# Patient Record
Sex: Female | Born: 1935 | Race: White | Hispanic: No | Smoking: Former smoker
Health system: Southern US, Community
[De-identification: ages and names within clinical notes are randomized; demographics above are authoritative.]

## PROBLEM LIST (undated history)

## (undated) DIAGNOSIS — N289 Disorder of kidney and ureter, unspecified: Secondary | ICD-10-CM

## (undated) DIAGNOSIS — I4821 Permanent atrial fibrillation: Secondary | ICD-10-CM

## (undated) DIAGNOSIS — M5136 Other intervertebral disc degeneration, lumbar region: Secondary | ICD-10-CM

## (undated) DIAGNOSIS — M51369 Other intervertebral disc degeneration, lumbar region without mention of lumbar back pain or lower extremity pain: Secondary | ICD-10-CM

## (undated) DIAGNOSIS — F0390 Unspecified dementia without behavioral disturbance: Secondary | ICD-10-CM

## (undated) DIAGNOSIS — G629 Polyneuropathy, unspecified: Secondary | ICD-10-CM

## (undated) DIAGNOSIS — I671 Cerebral aneurysm, nonruptured: Secondary | ICD-10-CM

## (undated) DIAGNOSIS — I2699 Other pulmonary embolism without acute cor pulmonale: Secondary | ICD-10-CM

## (undated) DIAGNOSIS — I82409 Acute embolism and thrombosis of unspecified deep veins of unspecified lower extremity: Secondary | ICD-10-CM

## (undated) DIAGNOSIS — J449 Chronic obstructive pulmonary disease, unspecified: Secondary | ICD-10-CM

## (undated) DIAGNOSIS — M1990 Unspecified osteoarthritis, unspecified site: Secondary | ICD-10-CM

## (undated) DIAGNOSIS — G8929 Other chronic pain: Secondary | ICD-10-CM

## (undated) DIAGNOSIS — I10 Essential (primary) hypertension: Secondary | ICD-10-CM

## (undated) DIAGNOSIS — M109 Gout, unspecified: Secondary | ICD-10-CM

## (undated) DIAGNOSIS — M25561 Pain in right knee: Secondary | ICD-10-CM

## (undated) DIAGNOSIS — M25562 Pain in left knee: Secondary | ICD-10-CM

## (undated) HISTORY — PX: PACEMAKER INSERTION: SHX728

## (undated) HISTORY — PX: OTHER SURGICAL HISTORY: SHX169

---

## 1999-05-08 HISTORY — PX: CARDIAC CATHETERIZATION: SHX172

## 1999-09-08 ENCOUNTER — Encounter: Payer: Self-pay | Admitting: Cardiology

## 1999-09-08 ENCOUNTER — Inpatient Hospital Stay (HOSPITAL_COMMUNITY): Admission: AD | Admit: 1999-09-08 | Discharge: 1999-09-14 | Payer: Self-pay | Admitting: Cardiology

## 1999-09-14 ENCOUNTER — Encounter: Payer: Self-pay | Admitting: Cardiology

## 2000-08-28 ENCOUNTER — Encounter: Payer: Self-pay | Admitting: Emergency Medicine

## 2000-08-28 ENCOUNTER — Inpatient Hospital Stay (HOSPITAL_COMMUNITY): Admission: EM | Admit: 2000-08-28 | Discharge: 2000-08-29 | Payer: Self-pay | Admitting: Cardiology

## 2001-05-15 ENCOUNTER — Other Ambulatory Visit: Admission: RE | Admit: 2001-05-15 | Discharge: 2001-05-15 | Payer: Self-pay | Admitting: Dermatology

## 2001-10-06 ENCOUNTER — Emergency Department (HOSPITAL_COMMUNITY): Admission: EM | Admit: 2001-10-06 | Discharge: 2001-10-06 | Payer: Self-pay | Admitting: *Deleted

## 2001-11-25 ENCOUNTER — Ambulatory Visit (HOSPITAL_COMMUNITY): Admission: RE | Admit: 2001-11-25 | Discharge: 2001-11-25 | Payer: Self-pay | Admitting: Cardiology

## 2002-06-07 HISTORY — PX: OTHER SURGICAL HISTORY: SHX169

## 2002-07-13 ENCOUNTER — Encounter: Payer: Self-pay | Admitting: Emergency Medicine

## 2002-07-13 ENCOUNTER — Emergency Department (HOSPITAL_COMMUNITY): Admission: EM | Admit: 2002-07-13 | Discharge: 2002-07-13 | Payer: Self-pay | Admitting: Emergency Medicine

## 2002-07-31 ENCOUNTER — Ambulatory Visit (HOSPITAL_COMMUNITY): Admission: RE | Admit: 2002-07-31 | Discharge: 2002-07-31 | Payer: Self-pay | Admitting: Pulmonary Disease

## 2002-11-12 ENCOUNTER — Ambulatory Visit (HOSPITAL_COMMUNITY): Admission: RE | Admit: 2002-11-12 | Discharge: 2002-11-12 | Payer: Self-pay | Admitting: Internal Medicine

## 2003-10-13 ENCOUNTER — Ambulatory Visit (HOSPITAL_COMMUNITY): Admission: RE | Admit: 2003-10-13 | Discharge: 2003-10-13 | Payer: Self-pay | Admitting: *Deleted

## 2004-02-23 ENCOUNTER — Encounter (HOSPITAL_COMMUNITY): Admission: RE | Admit: 2004-02-23 | Discharge: 2004-03-24 | Payer: Self-pay | Admitting: Orthopedic Surgery

## 2004-03-21 ENCOUNTER — Ambulatory Visit: Payer: Self-pay | Admitting: *Deleted

## 2004-03-28 ENCOUNTER — Encounter (HOSPITAL_COMMUNITY): Admission: RE | Admit: 2004-03-28 | Discharge: 2004-04-27 | Payer: Self-pay | Admitting: Orthopedic Surgery

## 2004-04-14 ENCOUNTER — Ambulatory Visit: Payer: Self-pay

## 2004-04-21 ENCOUNTER — Ambulatory Visit: Payer: Self-pay | Admitting: *Deleted

## 2004-04-24 ENCOUNTER — Ambulatory Visit: Payer: Self-pay | Admitting: Internal Medicine

## 2004-04-28 ENCOUNTER — Ambulatory Visit: Payer: Self-pay | Admitting: Cardiology

## 2004-05-05 ENCOUNTER — Ambulatory Visit: Payer: Self-pay | Admitting: *Deleted

## 2004-05-12 ENCOUNTER — Ambulatory Visit: Payer: Self-pay

## 2004-06-05 ENCOUNTER — Ambulatory Visit: Payer: Self-pay | Admitting: *Deleted

## 2004-06-05 ENCOUNTER — Ambulatory Visit: Payer: Self-pay

## 2004-06-08 ENCOUNTER — Ambulatory Visit: Payer: Self-pay | Admitting: Internal Medicine

## 2004-06-23 ENCOUNTER — Ambulatory Visit: Payer: Self-pay | Admitting: *Deleted

## 2004-07-11 ENCOUNTER — Ambulatory Visit: Payer: Self-pay | Admitting: Internal Medicine

## 2004-07-14 ENCOUNTER — Ambulatory Visit: Payer: Self-pay | Admitting: *Deleted

## 2004-08-10 ENCOUNTER — Ambulatory Visit: Payer: Self-pay | Admitting: Internal Medicine

## 2004-08-15 ENCOUNTER — Ambulatory Visit (HOSPITAL_COMMUNITY): Admission: RE | Admit: 2004-08-15 | Discharge: 2004-08-15 | Payer: Self-pay | Admitting: Pulmonary Disease

## 2004-08-15 ENCOUNTER — Ambulatory Visit: Payer: Self-pay | Admitting: *Deleted

## 2004-08-23 ENCOUNTER — Ambulatory Visit: Payer: Self-pay | Admitting: *Deleted

## 2004-09-20 ENCOUNTER — Ambulatory Visit: Payer: Self-pay | Admitting: Internal Medicine

## 2004-09-21 ENCOUNTER — Ambulatory Visit: Payer: Self-pay | Admitting: *Deleted

## 2004-10-20 ENCOUNTER — Ambulatory Visit: Payer: Self-pay | Admitting: *Deleted

## 2004-10-24 ENCOUNTER — Ambulatory Visit: Payer: Self-pay | Admitting: Internal Medicine

## 2004-11-16 ENCOUNTER — Ambulatory Visit: Payer: Self-pay | Admitting: *Deleted

## 2004-11-23 ENCOUNTER — Ambulatory Visit: Payer: Self-pay | Admitting: Internal Medicine

## 2004-12-14 ENCOUNTER — Ambulatory Visit: Payer: Self-pay | Admitting: *Deleted

## 2005-01-17 ENCOUNTER — Ambulatory Visit: Payer: Self-pay | Admitting: *Deleted

## 2005-01-30 ENCOUNTER — Ambulatory Visit: Payer: Self-pay | Admitting: *Deleted

## 2005-02-16 ENCOUNTER — Ambulatory Visit (HOSPITAL_COMMUNITY): Admission: RE | Admit: 2005-02-16 | Discharge: 2005-02-16 | Payer: Self-pay | Admitting: Pulmonary Disease

## 2005-02-20 ENCOUNTER — Ambulatory Visit: Payer: Self-pay | Admitting: *Deleted

## 2005-03-01 ENCOUNTER — Ambulatory Visit: Payer: Self-pay | Admitting: Internal Medicine

## 2005-03-20 ENCOUNTER — Ambulatory Visit: Payer: Self-pay | Admitting: *Deleted

## 2005-04-04 ENCOUNTER — Ambulatory Visit: Payer: Self-pay | Admitting: Internal Medicine

## 2005-04-18 ENCOUNTER — Ambulatory Visit: Payer: Self-pay | Admitting: *Deleted

## 2005-05-04 ENCOUNTER — Ambulatory Visit: Payer: Self-pay | Admitting: Internal Medicine

## 2005-05-23 ENCOUNTER — Ambulatory Visit: Payer: Self-pay | Admitting: *Deleted

## 2005-06-07 ENCOUNTER — Ambulatory Visit: Payer: Self-pay | Admitting: Internal Medicine

## 2005-06-25 ENCOUNTER — Ambulatory Visit: Payer: Self-pay | Admitting: *Deleted

## 2005-07-11 ENCOUNTER — Ambulatory Visit: Payer: Self-pay | Admitting: Internal Medicine

## 2005-07-23 ENCOUNTER — Ambulatory Visit: Payer: Self-pay | Admitting: Internal Medicine

## 2005-08-20 ENCOUNTER — Ambulatory Visit: Payer: Self-pay | Admitting: Internal Medicine

## 2005-08-29 ENCOUNTER — Ambulatory Visit: Payer: Self-pay | Admitting: *Deleted

## 2005-09-01 ENCOUNTER — Emergency Department (HOSPITAL_COMMUNITY): Admission: EM | Admit: 2005-09-01 | Discharge: 2005-09-01 | Payer: Self-pay | Admitting: Emergency Medicine

## 2005-09-05 ENCOUNTER — Inpatient Hospital Stay (HOSPITAL_COMMUNITY): Admission: EM | Admit: 2005-09-05 | Discharge: 2005-09-06 | Payer: Self-pay | Admitting: Emergency Medicine

## 2005-09-07 ENCOUNTER — Encounter (HOSPITAL_COMMUNITY): Admission: RE | Admit: 2005-09-07 | Discharge: 2005-10-07 | Payer: Self-pay | Admitting: Pulmonary Disease

## 2005-09-07 ENCOUNTER — Ambulatory Visit (HOSPITAL_COMMUNITY): Payer: Self-pay | Admitting: Pulmonary Disease

## 2005-09-19 ENCOUNTER — Ambulatory Visit (HOSPITAL_COMMUNITY): Payer: Self-pay | Admitting: Pulmonary Disease

## 2005-09-24 ENCOUNTER — Ambulatory Visit: Payer: Self-pay | Admitting: Orthopedic Surgery

## 2005-09-24 ENCOUNTER — Ambulatory Visit: Payer: Self-pay | Admitting: Internal Medicine

## 2005-09-25 ENCOUNTER — Ambulatory Visit: Payer: Self-pay | Admitting: Cardiology

## 2005-10-03 ENCOUNTER — Ambulatory Visit: Payer: Self-pay | Admitting: *Deleted

## 2005-10-09 ENCOUNTER — Encounter (HOSPITAL_COMMUNITY): Admission: RE | Admit: 2005-10-09 | Discharge: 2005-11-08 | Payer: Self-pay | Admitting: Oncology

## 2005-10-18 ENCOUNTER — Ambulatory Visit: Payer: Self-pay | Admitting: *Deleted

## 2005-10-29 ENCOUNTER — Ambulatory Visit: Payer: Self-pay | Admitting: Internal Medicine

## 2005-11-15 ENCOUNTER — Ambulatory Visit: Payer: Self-pay | Admitting: Cardiology

## 2005-11-23 ENCOUNTER — Ambulatory Visit: Payer: Self-pay | Admitting: Internal Medicine

## 2005-12-21 ENCOUNTER — Ambulatory Visit: Payer: Self-pay | Admitting: *Deleted

## 2005-12-28 ENCOUNTER — Ambulatory Visit: Payer: Self-pay | Admitting: Internal Medicine

## 2006-01-17 ENCOUNTER — Ambulatory Visit: Payer: Self-pay | Admitting: Cardiology

## 2006-02-15 ENCOUNTER — Ambulatory Visit: Payer: Self-pay | Admitting: Cardiovascular Disease

## 2006-02-15 ENCOUNTER — Ambulatory Visit: Payer: Self-pay | Admitting: Internal Medicine

## 2006-02-27 ENCOUNTER — Ambulatory Visit: Payer: Self-pay | Admitting: Cardiovascular Disease

## 2006-03-11 ENCOUNTER — Ambulatory Visit: Payer: Self-pay | Admitting: Internal Medicine

## 2006-03-16 ENCOUNTER — Emergency Department (HOSPITAL_COMMUNITY): Admission: EM | Admit: 2006-03-16 | Discharge: 2006-03-16 | Payer: Self-pay | Admitting: Emergency Medicine

## 2006-03-21 ENCOUNTER — Ambulatory Visit: Payer: Self-pay | Admitting: Cardiology

## 2006-04-08 ENCOUNTER — Ambulatory Visit: Payer: Self-pay | Admitting: Internal Medicine

## 2006-04-16 ENCOUNTER — Ambulatory Visit: Payer: Self-pay | Admitting: Cardiology

## 2006-05-06 ENCOUNTER — Ambulatory Visit: Payer: Self-pay | Admitting: Internal Medicine

## 2006-05-17 ENCOUNTER — Ambulatory Visit: Payer: Self-pay | Admitting: Cardiology

## 2006-06-03 ENCOUNTER — Ambulatory Visit: Payer: Self-pay | Admitting: Internal Medicine

## 2006-06-21 ENCOUNTER — Ambulatory Visit: Payer: Self-pay | Admitting: Internal Medicine

## 2006-07-01 ENCOUNTER — Ambulatory Visit: Payer: Self-pay | Admitting: Internal Medicine

## 2006-07-19 ENCOUNTER — Ambulatory Visit: Payer: Self-pay | Admitting: Internal Medicine

## 2006-07-29 ENCOUNTER — Ambulatory Visit: Payer: Self-pay | Admitting: Internal Medicine

## 2006-07-31 ENCOUNTER — Ambulatory Visit: Payer: Self-pay | Admitting: *Deleted

## 2006-08-14 ENCOUNTER — Ambulatory Visit: Payer: Self-pay | Admitting: Cardiology

## 2006-08-22 ENCOUNTER — Ambulatory Visit: Payer: Self-pay | Admitting: Cardiology

## 2006-08-26 ENCOUNTER — Ambulatory Visit: Payer: Self-pay | Admitting: Internal Medicine

## 2006-09-18 ENCOUNTER — Ambulatory Visit: Payer: Self-pay | Admitting: Cardiology

## 2006-09-19 ENCOUNTER — Ambulatory Visit: Payer: Self-pay | Admitting: Internal Medicine

## 2006-10-21 ENCOUNTER — Ambulatory Visit: Payer: Self-pay | Admitting: Internal Medicine

## 2006-10-23 ENCOUNTER — Ambulatory Visit: Payer: Self-pay | Admitting: Cardiology

## 2006-11-18 ENCOUNTER — Ambulatory Visit: Payer: Self-pay | Admitting: Internal Medicine

## 2006-11-19 ENCOUNTER — Ambulatory Visit: Payer: Self-pay | Admitting: Cardiology

## 2006-12-16 ENCOUNTER — Ambulatory Visit: Payer: Self-pay | Admitting: Cardiology

## 2006-12-16 ENCOUNTER — Ambulatory Visit: Payer: Self-pay | Admitting: Internal Medicine

## 2007-01-13 ENCOUNTER — Ambulatory Visit: Payer: Self-pay | Admitting: Internal Medicine

## 2007-01-16 ENCOUNTER — Ambulatory Visit: Payer: Self-pay

## 2007-02-10 ENCOUNTER — Ambulatory Visit: Payer: Self-pay | Admitting: Internal Medicine

## 2007-02-11 ENCOUNTER — Ambulatory Visit: Payer: Self-pay | Admitting: Cardiovascular Disease

## 2007-03-10 ENCOUNTER — Ambulatory Visit: Payer: Self-pay | Admitting: Internal Medicine

## 2007-03-18 ENCOUNTER — Ambulatory Visit: Payer: Self-pay | Admitting: Cardiology

## 2007-04-07 ENCOUNTER — Ambulatory Visit: Payer: Self-pay | Admitting: Internal Medicine

## 2007-04-16 ENCOUNTER — Ambulatory Visit: Payer: Self-pay | Admitting: Cardiology

## 2007-05-05 ENCOUNTER — Ambulatory Visit: Payer: Self-pay | Admitting: Internal Medicine

## 2007-05-16 ENCOUNTER — Ambulatory Visit: Payer: Self-pay | Admitting: Cardiology

## 2007-06-13 ENCOUNTER — Ambulatory Visit: Payer: Self-pay | Admitting: Cardiology

## 2007-07-16 ENCOUNTER — Ambulatory Visit: Payer: Self-pay | Admitting: Cardiology

## 2007-07-26 ENCOUNTER — Emergency Department (HOSPITAL_COMMUNITY): Admission: EM | Admit: 2007-07-26 | Discharge: 2007-07-26 | Payer: Self-pay | Admitting: Emergency Medicine

## 2007-08-13 ENCOUNTER — Ambulatory Visit: Payer: Self-pay | Admitting: Cardiology

## 2007-09-11 ENCOUNTER — Ambulatory Visit: Payer: Self-pay | Admitting: Cardiology

## 2007-09-26 ENCOUNTER — Ambulatory Visit: Payer: Self-pay | Admitting: Internal Medicine

## 2007-10-13 ENCOUNTER — Ambulatory Visit: Payer: Self-pay | Admitting: Cardiology

## 2007-11-05 ENCOUNTER — Ambulatory Visit: Payer: Self-pay | Admitting: Cardiovascular Disease

## 2007-11-26 ENCOUNTER — Ambulatory Visit: Payer: Self-pay | Admitting: Cardiology

## 2007-12-26 ENCOUNTER — Ambulatory Visit: Payer: Self-pay | Admitting: Internal Medicine

## 2007-12-31 ENCOUNTER — Ambulatory Visit: Payer: Self-pay | Admitting: Cardiology

## 2008-01-22 ENCOUNTER — Ambulatory Visit: Payer: Self-pay | Admitting: Cardiology

## 2008-02-04 ENCOUNTER — Ambulatory Visit: Payer: Self-pay | Admitting: Internal Medicine

## 2008-03-01 ENCOUNTER — Ambulatory Visit: Payer: Self-pay | Admitting: Cardiology

## 2008-03-26 ENCOUNTER — Ambulatory Visit: Payer: Self-pay | Admitting: Internal Medicine

## 2008-03-29 ENCOUNTER — Ambulatory Visit: Payer: Self-pay | Admitting: Cardiology

## 2008-03-31 ENCOUNTER — Ambulatory Visit: Payer: Self-pay | Admitting: Internal Medicine

## 2008-05-03 ENCOUNTER — Ambulatory Visit: Payer: Self-pay | Admitting: Cardiology

## 2008-05-31 ENCOUNTER — Ambulatory Visit: Payer: Self-pay | Admitting: Cardiology

## 2008-06-25 ENCOUNTER — Ambulatory Visit: Payer: Self-pay | Admitting: Internal Medicine

## 2008-06-28 ENCOUNTER — Ambulatory Visit: Payer: Self-pay | Admitting: Cardiology

## 2008-07-26 ENCOUNTER — Ambulatory Visit: Payer: Self-pay | Admitting: Cardiology

## 2008-08-20 ENCOUNTER — Encounter (INDEPENDENT_AMBULATORY_CARE_PROVIDER_SITE_OTHER): Payer: Self-pay

## 2008-08-26 ENCOUNTER — Ambulatory Visit: Payer: Self-pay | Admitting: Cardiology

## 2008-09-16 ENCOUNTER — Ambulatory Visit: Payer: Self-pay | Admitting: Cardiology

## 2008-09-24 ENCOUNTER — Ambulatory Visit: Payer: Self-pay | Admitting: Internal Medicine

## 2008-10-14 ENCOUNTER — Encounter (INDEPENDENT_AMBULATORY_CARE_PROVIDER_SITE_OTHER): Payer: Self-pay | Admitting: *Deleted

## 2008-10-21 ENCOUNTER — Ambulatory Visit: Payer: Self-pay | Admitting: Cardiology

## 2008-11-25 ENCOUNTER — Ambulatory Visit: Payer: Self-pay | Admitting: Cardiology

## 2008-12-16 ENCOUNTER — Ambulatory Visit: Payer: Self-pay | Admitting: Cardiology

## 2008-12-16 ENCOUNTER — Encounter: Payer: Self-pay | Admitting: Cardiology

## 2008-12-20 ENCOUNTER — Encounter: Payer: Self-pay | Admitting: *Deleted

## 2008-12-24 ENCOUNTER — Ambulatory Visit: Payer: Self-pay | Admitting: Internal Medicine

## 2009-01-12 ENCOUNTER — Encounter: Payer: Self-pay | Admitting: Cardiology

## 2009-01-13 ENCOUNTER — Ambulatory Visit: Payer: Self-pay

## 2009-01-13 LAB — CONVERTED CEMR LAB: POC INR: 2.7

## 2009-01-18 DIAGNOSIS — I2699 Other pulmonary embolism without acute cor pulmonale: Secondary | ICD-10-CM | POA: Insufficient documentation

## 2009-01-18 DIAGNOSIS — I82409 Acute embolism and thrombosis of unspecified deep veins of unspecified lower extremity: Secondary | ICD-10-CM | POA: Insufficient documentation

## 2009-01-18 DIAGNOSIS — I495 Sick sinus syndrome: Secondary | ICD-10-CM | POA: Insufficient documentation

## 2009-01-18 DIAGNOSIS — I4891 Unspecified atrial fibrillation: Secondary | ICD-10-CM | POA: Insufficient documentation

## 2009-02-10 ENCOUNTER — Ambulatory Visit: Payer: Self-pay | Admitting: Cardiology

## 2009-02-10 LAB — CONVERTED CEMR LAB: POC INR: 2.7

## 2009-03-10 ENCOUNTER — Ambulatory Visit: Payer: Self-pay | Admitting: Cardiology

## 2009-03-10 LAB — CONVERTED CEMR LAB: POC INR: 2.5

## 2009-03-25 ENCOUNTER — Ambulatory Visit: Payer: Self-pay | Admitting: Internal Medicine

## 2009-04-06 ENCOUNTER — Ambulatory Visit: Payer: Self-pay | Admitting: Internal Medicine

## 2009-04-06 DIAGNOSIS — I10 Essential (primary) hypertension: Secondary | ICD-10-CM | POA: Insufficient documentation

## 2009-04-06 DIAGNOSIS — Z950 Presence of cardiac pacemaker: Secondary | ICD-10-CM | POA: Insufficient documentation

## 2009-04-11 ENCOUNTER — Ambulatory Visit: Payer: Self-pay | Admitting: Cardiovascular Disease

## 2009-04-11 LAB — CONVERTED CEMR LAB: POC INR: 2.9

## 2009-05-12 ENCOUNTER — Ambulatory Visit: Payer: Self-pay | Admitting: Cardiology

## 2009-05-12 LAB — CONVERTED CEMR LAB: POC INR: 2.1

## 2009-05-18 ENCOUNTER — Telehealth: Payer: Self-pay | Admitting: Cardiology

## 2009-05-19 ENCOUNTER — Encounter: Payer: Self-pay | Admitting: Cardiology

## 2009-06-09 ENCOUNTER — Ambulatory Visit: Payer: Self-pay | Admitting: Cardiology

## 2009-06-09 LAB — CONVERTED CEMR LAB: POC INR: 3.3

## 2009-06-24 ENCOUNTER — Ambulatory Visit: Payer: Self-pay | Admitting: Internal Medicine

## 2009-07-07 ENCOUNTER — Ambulatory Visit: Payer: Self-pay | Admitting: Cardiology

## 2009-07-07 LAB — CONVERTED CEMR LAB: POC INR: 3

## 2009-07-25 ENCOUNTER — Telehealth (INDEPENDENT_AMBULATORY_CARE_PROVIDER_SITE_OTHER): Payer: Self-pay

## 2009-08-04 ENCOUNTER — Ambulatory Visit: Payer: Self-pay | Admitting: Cardiology

## 2009-08-04 LAB — CONVERTED CEMR LAB: POC INR: 3.1

## 2009-08-10 ENCOUNTER — Ambulatory Visit: Payer: Self-pay | Admitting: Cardiology

## 2009-09-01 ENCOUNTER — Ambulatory Visit: Payer: Self-pay | Admitting: Cardiology

## 2009-09-01 LAB — CONVERTED CEMR LAB: POC INR: 2.5

## 2009-09-23 ENCOUNTER — Ambulatory Visit: Payer: Self-pay | Admitting: Internal Medicine

## 2009-09-29 ENCOUNTER — Ambulatory Visit: Payer: Self-pay | Admitting: Cardiology

## 2009-09-29 LAB — CONVERTED CEMR LAB: POC INR: 2.1

## 2009-10-18 ENCOUNTER — Ambulatory Visit (HOSPITAL_COMMUNITY): Admission: RE | Admit: 2009-10-18 | Discharge: 2009-10-18 | Payer: Self-pay | Admitting: Pulmonary Disease

## 2009-10-31 ENCOUNTER — Ambulatory Visit: Payer: Self-pay | Admitting: Cardiology

## 2009-10-31 LAB — CONVERTED CEMR LAB: POC INR: 2.5

## 2009-12-01 ENCOUNTER — Ambulatory Visit: Payer: Self-pay | Admitting: Cardiology

## 2009-12-01 LAB — CONVERTED CEMR LAB: POC INR: 1.8

## 2009-12-14 ENCOUNTER — Ambulatory Visit: Payer: Self-pay | Admitting: Cardiology

## 2009-12-14 LAB — CONVERTED CEMR LAB: POC INR: 2.8

## 2009-12-23 ENCOUNTER — Ambulatory Visit: Payer: Self-pay | Admitting: Internal Medicine

## 2009-12-26 ENCOUNTER — Ambulatory Visit: Payer: Self-pay | Admitting: Cardiovascular Disease

## 2010-01-11 ENCOUNTER — Ambulatory Visit: Payer: Self-pay | Admitting: Cardiology

## 2010-01-11 LAB — CONVERTED CEMR LAB: POC INR: 2.1

## 2010-02-08 ENCOUNTER — Ambulatory Visit: Payer: Self-pay | Admitting: Cardiology

## 2010-02-08 LAB — CONVERTED CEMR LAB: POC INR: 2

## 2010-03-08 ENCOUNTER — Ambulatory Visit: Payer: Self-pay | Admitting: Cardiology

## 2010-03-08 LAB — CONVERTED CEMR LAB: POC INR: 2.7

## 2010-03-24 ENCOUNTER — Ambulatory Visit: Payer: Self-pay | Admitting: Internal Medicine

## 2010-04-03 ENCOUNTER — Encounter: Payer: Self-pay | Admitting: Internal Medicine

## 2010-04-03 ENCOUNTER — Ambulatory Visit: Payer: Self-pay | Admitting: Internal Medicine

## 2010-04-04 ENCOUNTER — Encounter: Payer: Self-pay | Admitting: Internal Medicine

## 2010-04-05 ENCOUNTER — Ambulatory Visit: Payer: Self-pay | Admitting: Cardiology

## 2010-04-05 LAB — CONVERTED CEMR LAB
POC INR: 2.7
TSH: 8.483 microintl units/mL — ABNORMAL HIGH (ref 0.350–4.500)

## 2010-05-03 ENCOUNTER — Ambulatory Visit: Payer: Self-pay | Admitting: Cardiology

## 2010-05-03 LAB — CONVERTED CEMR LAB: POC INR: 2.5

## 2010-05-31 ENCOUNTER — Ambulatory Visit: Admission: RE | Admit: 2010-05-31 | Discharge: 2010-05-31 | Payer: Self-pay | Source: Home / Self Care

## 2010-05-31 LAB — CONVERTED CEMR LAB: POC INR: 2.8

## 2010-06-06 NOTE — Medication Information (Signed)
Summary: ccr-lr  Anticoagulant Therapy  Managed by: Vashti Hey, RN PCP: Dr.Edward Hoover Browns MD: Dietrich Pates MD, Molly Maduro Indication 1: Atrial Fibrillation (ICD-427.31) Indication 2: Pulmonary Embolism and Infarction (ICD-415.1) Lab Used: [facility] [place] Site: [place] INR POC 2.7  Dietary changes: no    Health status changes: no    Bleeding/hemorrhagic complications: no    Recent/future hospitalizations: no    Any changes in medication regimen? no    Recent/future dental: no  Any missed doses?: no       Is patient compliant with meds? yes       Allergies: 1)  ! Penicillin 2)  ! * Codiene 3)  ! * Bee Sting  Anticoagulation Management History:      The patient is taking warfarin and comes in today for a routine follow up visit.  Positive risk factors for bleeding include an age of 75 years or older.  The bleeding index is 'intermediate risk'.  Positive CHADS2 values include History of HTN and Age > 65 years old.  The start date was 09/21/1999.  Anticoagulation responsible provider: Dietrich Pates MD, Molly Maduro.  INR POC: 2.7.  Cuvette Lot#: [id number].  Exp: 10/11.    Anticoagulation Management Assessment/Plan:      The patient's current anticoagulation dose is Warfarin sodium 5 mg tabs: take one tablet daily as directed by  anticoagulation Clinic.  The target INR is 2 - 3.  The next INR is due 04/05/2010.  Anticoagulation instructions were given to patient.  Results were reviewed/authorized by Vashti Hey, RN.  She was notified by Vashti Hey RN.         Prior Anticoagulation Instructions: INR 2.0 Increase coumadin to 5mg  once daily    Current Anticoagulation Instructions: INR 2.7 Continue coumadin 5mg  once daily

## 2010-06-06 NOTE — Cardiovascular Report (Signed)
Summary: TTM   TTM   Imported By: Roderic Ovens 04/14/2010 11:25:11  _____________________________________________________________________  External Attachment:    Type:   Image     Comment:   External Document

## 2010-06-06 NOTE — Assessment & Plan Note (Signed)
Summary: 6 mth f/u per checkout on 08/10/09/tg    Primary [name]:  Dr.Edward Juanetta Gosling   History of Present Illness: [name] returns today for followup.  She has chronic atrial fibrillation with sick sinus syndrome and pacemaker backup.  She has a history of a PE in 2004 with chronic Coumadin. She lives alone but has neighbors that check in on her.  She notes swelling at time in her neck and relates a h/o thyroid problems which have not recently been followed.  She has atypical c/p symptoms. No relation to exertion.  Allergies: 1)  ! Penicillin 2)  ! * Codiene 3)  ! 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  The patient denies syncope and peripheral edema.    Vital Signs:  Patient profile:   77 year old female Pulse rate:   60 / minute Resp:     18 per minute BP supine:   140 / 84  Physical Exam  General:  Affect appropriate Healthy:  appears stated age HEENT: normal Neck supple with no adenopathy JVP normal no bruits no thyromegaly Lungs clear with no wheezing and good diaphragmatic motion Heart:  S1/S2 no murmur,rub, gallop or click PMI normal Abdomen: benighn, BS positve, no tenderness, no AAA no bruit.  No HSM or HJR Distal pulses intact with no bruits No edema Neuro non-focal Skin warm and dry    PPM Specifications Following MD:  Lewayne Bunting, MD     PPM Vendor:  [employer]     PPM Model Number:  [id number]     PPM Serial Number:  [id number] PPM DOI:  09/13/1999     PPM Implanting MD:  Lewayne Bunting,  MD  Lead 1    Location: RA     DOI: 09/13/1999     Model #: [id number]     Serial #: [id number]     Status: active Lead 2    Location: RV     DOI: 09/13/1999     Model #: [id number]     Serial #: [id number]     Status: active  Magnet Response Rate:  BOL 85 ERI 65  Indications:  AFIB/SSS   PPM Follow Up Remote Check?  No Battery Voltage:  2.75 V     Battery Est. Longevity:  3 years     Pacer Dependent:  No     Right Ventricle  Amplitude: 11.20 mV, Impedance: 475 ohms, Threshold: 0.5 V at 0.4 msec  Episodes Coumadin:  Yes Ventricular Pacing:  29.4%  Parameters Mode:  VVIR     Lower Rate Limit:  60     Upper Rate Limit:  120 Next Cardiology Appt Due:  09/05/2010 Tech Comments:  No parameter changes.  Device function normal.  A-fib , + coumadin.   TTM's with [employer].  ROV 6 months RDS clinic. [name], LPN  April 03, 2010 2:43 PM  MD Comments:  Agree with above.  Impression & Recommendations:  Problem # 1:  CARDIAC PACEMAKER IN SITU (ICD-V45.01) Her device is working normally. Will recheck in several months.  Problem # 2:  ESSENTIAL HYPERTENSION, BENIGN (ICD-401.1) Her blood  pressure is well controlled.  continue her current meds. Her updated medication list for this problem includes:    Diltiazem Hcl Cr 240 Mg Xr24h-cap (Diltiazem hcl) .Marland Kitchen... Take 1 tab daily    Lopressor 100 Mg Tabs (Metoprolol tartrate) .Marland Kitchen... Take 1/2 tab two times a day    Aspir-low 81 Mg Tbec (Aspirin) .Marland Kitchen... Take 1 tab daily  Problem # 3:  ATRIAL FIBRILLATION, CHRONIC (ICD-427.31)  Her rate appears to be well controlled. Continue her current meds. Her updated medication list for this problem includes:    Warfarin Sodium 5 Mg Tabs (Warfarin sodium) .Marland Kitchen... Take one tablet daily as directed by  anticoagulation clinic    Lopressor 100 Mg Tabs (Metoprolol tartrate) .Marland Kitchen... Take 1/2 tab two times a day    Aspir-low 81 Mg Tbec (Aspirin) .Marland Kitchen... Take 1 tab daily    Digoxin 0.125 Mg Tabs (Digoxin) .Marland Kitchen... Take a half  tablet  by mouth daily  Her updated medication list for this problem includes:    Warfarin Sodium 5 Mg Tabs (Warfarin sodium) .Marland Kitchen... Take one tablet daily as directed by  anticoagulation clinic    Lopressor 100 Mg Tabs (Metoprolol tartrate) .Marland Kitchen... Take 1/2 tab two times a day    Aspir-low 81 Mg Tbec (Aspirin) .Marland Kitchen... Take 1 tab daily    Digoxin 0.125 Mg Tabs (Digoxin) .Marland Kitchen... Take a half  tablet by mouth daily  Other Orders: T-TSH ([id number])  Patient Instructions: 1)  Your physician recommends that you schedule a follow-up appointment in: 1 YEAR Cross, 6 MONTHS Tracy 2)  Your physician recommends that you return for lab work in: TODAY

## 2010-06-06 NOTE — Progress Notes (Signed)
Summary: Dizziness   Phone Note Call from Patient   Caller: Patient Reason for Call: Talk to Nurse Summary of Call: pt states that she has been having some dizziness for the past week/tg Initial call taken by: Raechel Ache [facility],  July 25, 2009 10:17 AM  Follow-up for Phone Call        Pt. states she had some dizziness on Saturday that she thought may have been due to switching from Coumadin to Warfarin. She states she has had little to no more dizziness since and that she will call us back if she does. Follow-up by: Larita Fife Via LPN,  July 26, 2009 10:20 AM

## 2010-06-06 NOTE — Medication Information (Signed)
Summary: ccr-lr  Anticoagulant Therapy  Managed by: Vashti Hey, RN Supervising MD: Diona Browner MD, Remi Deter Indication 1: Atrial Fibrillation (ICD-427.31) Indication 2: Pulmonary Embolism and Infarction (ICD-415.1) Lab Used: [facility] [place] Site: [place] INR POC 2.8  Dietary changes: no    Health status changes: yes       Details: has gout in toe  Bleeding/hemorrhagic complications: no    Recent/future hospitalizations: no    Any changes in medication regimen? yes       Details: on 2 new meds for gout  Recent/future dental: no  Any missed doses?: no       Is patient compliant with meds? yes       Allergies: 1)  ! Penicillin 2)  ! * Codiene 3)  ! * Bee Sting  Anticoagulation Management History:      The patient is taking warfarin and comes in today for a routine follow up visit.  Positive risk factors for bleeding include an age of 13 years or older.  The bleeding index is 'intermediate risk'.  Positive CHADS2 values include History of HTN and Age > 79 years old.  The start date was 09/21/1999.  Anticoagulation responsible provider: Diona Browner MD, Remi Deter.  INR POC: 2.8.  Cuvette Lot#: [id number].  Exp: 10/11.    Anticoagulation Management Assessment/Plan:      The patient's current anticoagulation dose is Warfarin sodium 5 mg tabs: take one tablet daily as directed by  anticoagulation Clinic.  The target INR is 2 - 3.  The next INR is due 01/11/2010.  Anticoagulation instructions were given to patient.  Results were reviewed/authorized by Vashti Hey, RN.  She was notified by Vashti Hey RN.         Prior Anticoagulation Instructions: INR 1.8 Take coumadin 1 1/2 tablet tonight then resume 1 tablet once daily except 1/2 tablet on Fridays. Will not increase weekly dose since pt is starting prednisone dose pack.  Recheck INR 12/14/09  Current Anticoagulation Instructions: INR 2.8 Continue coumadin 5mg  once daily except 2.5mg  on Fridays

## 2010-06-06 NOTE — Cardiovascular Report (Signed)
Summary: TTM   TTM   Imported By: Roderic Ovens 01/05/2010 11:38:36  _____________________________________________________________________  External Attachment:    Type:   Image     Comment:   External Document

## 2010-06-06 NOTE — Medication Information (Signed)
Summary: ccr-lr  Anticoagulant Therapy  Managed by: Vashti Hey, RN Supervising MD: Diona Browner MD, Remi Deter Indication 1: Atrial Fibrillation (ICD-427.31) Indication 2: Pulmonary Embolism and Infarction (ICD-415.1) Lab Used: [facility] [place] Site: [place] INR POC 3.0  Dietary changes: no    Health status changes: no    Bleeding/hemorrhagic complications: no    Recent/future hospitalizations: no    Any changes in medication regimen? no    Recent/future dental: no  Any missed doses?: no       Is patient compliant with meds? yes       Allergies: No Known Drug Allergies  Anticoagulation Management History:      The patient is taking warfarin and comes in today for a routine follow up visit.  Positive risk factors for bleeding include an age of 75 years or older.  The bleeding index is 'intermediate risk'.  Positive CHADS2 values include History of HTN and Age > 31 years old.  The start date was 09/21/1999.  Anticoagulation responsible provider: Diona Browner MD, Remi Deter.  INR POC: 3.0.  Cuvette Lot#: [id number].  Exp: 10/11.    Anticoagulation Management Assessment/Plan:      The patient's current anticoagulation dose is Warfarin sodium 5 mg tabs: take one tablet daily as directed by  anticoagulation Clinic.  The target INR is 2 - 3.  The next INR is due 08/04/2009.  Anticoagulation instructions were given to patient.  Results were reviewed/authorized by Vashti Hey, RN.  She was notified by Vashti Hey RN.         Prior Anticoagulation Instructions: INR 3.3 Hold coumadin tonight then resume 5mg  once daily except 2.5mg  on Fridays  Current Anticoagulation Instructions: INR 3.0 Continue coumadin 5mg  once daily except 2.5mg  on Fridays

## 2010-06-06 NOTE — Medication Information (Signed)
Summary: ccr-lr  Anticoagulant Therapy  Managed by: Vashti Hey, RN PCP: Dr.Edward Hoover Browns MD: Diona Browner MD, Remi Deter Indication 1: Atrial Fibrillation (ICD-427.31) Indication 2: Pulmonary Embolism and Infarction (ICD-415.1) Lab Used: [facility] [place] Site: [place] INR POC 2.7  Dietary changes: no    Health status changes: no    Bleeding/hemorrhagic complications: no    Recent/future hospitalizations: no    Any changes in medication regimen? no    Recent/future dental: no  Any missed doses?: no       Is patient compliant with meds? yes       Allergies: 1)  ! Penicillin 2)  ! * Codiene 3)  ! * Bee Sting  Anticoagulation Management History:      The patient is taking warfarin and comes in today for a routine follow up visit.  Positive risk factors for bleeding include an age of 75 years or older.  The bleeding index is 'intermediate risk'.  Positive CHADS2 values include History of HTN and Age > 85 years old.  The start date was 09/21/1999.  Anticoagulation responsible provider: Diona Browner MD, Remi Deter.  INR POC: 2.7.  Cuvette Lot#: [id number].  Exp: 10/11.    Anticoagulation Management Assessment/Plan:      The patient's current anticoagulation dose is Warfarin sodium 5 mg tabs: take one tablet daily as directed by  anticoagulation Clinic.  The target INR is 2 - 3.  The next INR is due 05/03/2010.  Anticoagulation instructions were given to patient.  Results were reviewed/authorized by Vashti Hey, RN.  She was notified by Vashti Hey RN.         Prior Anticoagulation Instructions: INR 2.7 Continue coumadin 5mg  once daily   Current Anticoagulation Instructions: Same as Prior Instructions.

## 2010-06-06 NOTE — Medication Information (Signed)
Summary: ccr-lr  Anticoagulant Therapy  Managed by: Vashti Hey, RN Supervising MD: Dietrich Pates MD, Molly Maduro Indication 1: Atrial Fibrillation (ICD-427.31) Indication 2: Pulmonary Embolism and Infarction (ICD-415.1) Lab Used: [facility] [place] Site: [place] INR POC 3.3  Dietary changes: no    Health status changes: no    Bleeding/hemorrhagic complications: no    Recent/future hospitalizations: no    Any changes in medication regimen? no    Recent/future dental: no  Any missed doses?: no       Is patient compliant with meds? yes       Allergies: No Known Drug Allergies  Anticoagulation Management History:      The patient is taking warfarin and comes in today for a routine follow up visit.  Positive risk factors for bleeding include an age of 75 years or older.  The bleeding index is 'intermediate risk'.  Positive CHADS2 values include History of HTN and Age > 62 years old.  The start date was 09/21/1999.  Anticoagulation responsible provider: Dietrich Pates MD, Molly Maduro.  INR POC: 3.3.  Cuvette Lot#: [id number].  Exp: 10/11.    Anticoagulation Management Assessment/Plan:      The patient's current anticoagulation dose is Warfarin sodium 5 mg tabs: take one tablet daily as directed by  anticoagulation Clinic.  The target INR is 2 - 3.  The next INR is due 07/07/2009.  Anticoagulation instructions were given to patient.  Results were reviewed/authorized by Vashti Hey, RN.  She was notified by Vashti Hey RN.         Prior Anticoagulation Instructions: INR 2.1 Continue coumadin 5mg  once daily except 2.5mg  on Fridays  Current Anticoagulation Instructions: INR 3.3 Hold coumadin tonight then resume 5mg  once daily except 2.5mg  on Fridays

## 2010-06-06 NOTE — Miscellaneous (Signed)
Summary: warfarin refill   Clinical Lists Changes  Medications: Changed medication from COUMADIN 5 MG SOLR (WARFARIN SODIUM) Take as directed by the Coumadin Clinic to WARFARIN SODIUM 5 MG TABS (WARFARIN SODIUM) take one tablet daily as directed by  anticoagulation Clinic - Signed Rx of WARFARIN SODIUM 5 MG TABS (WARFARIN SODIUM) take one tablet daily as directed by  anticoagulation Clinic;  #60 x 3;  Signed;  Entered by: Teressa Lower RN;  Authorized by: Gaylord Shih, MD, [facility];  Method used: Electronically to [facility]*, [address] [place], [place], [place]  [address], Ph: [phone], Fax: [phone]    Prescriptions: WARFARIN SODIUM 5 MG TABS (WARFARIN SODIUM) take one tablet daily as directed by  anticoagulation Clinic  #60 x 3   Entered by:   Teressa Lower RN   Authorized by:   Gaylord Shih, MD, [facility]   Signed by:   Teressa Lower RN on 05/19/2009   Method used:   Electronically to        [facility]* (retail)       [address]       [place], [place]  [address]       Ph: [phone]       Fax: [phone]   RxID:   [id number]

## 2010-06-06 NOTE — Medication Information (Signed)
Summary: ccr-lr  Anticoagulant Therapy  Managed by: Vashti Hey, RN Supervising MD: Dietrich Pates MD, Molly Maduro Indication 1: Atrial Fibrillation (ICD-427.31) Indication 2: Pulmonary Embolism and Infarction (ICD-415.1) Lab Used: [facility] [place] Site: [place] INR POC 2.1  Dietary changes: no    Health status changes: no    Bleeding/hemorrhagic complications: no    Recent/future hospitalizations: no    Any changes in medication regimen? no    Recent/future dental: no  Any missed doses?: yes     Details: Missed 1 dose  Is patient compliant with meds? yes       Allergies: No Known Drug Allergies  Anticoagulation Management History:      The patient is taking warfarin and comes in today for a routine follow up visit.  Positive risk factors for bleeding include an age of 2 years or older.  The bleeding index is 'intermediate risk'.  Positive CHADS2 values include History of HTN and Age > 80 years old.  The start date was 09/21/1999.  Anticoagulation responsible provider: Dietrich Pates MD, Molly Maduro.  INR POC: 2.1.  Cuvette Lot#: [id number].  Exp: 10/11.    Anticoagulation Management Assessment/Plan:      The patient's current anticoagulation dose is Coumadin 5 mg solr: Take as directed by the Coumadin Clinic.  The target INR is 2 - 3.  The next INR is due 06/09/2009.  Anticoagulation instructions were given to patient.  Results were reviewed/authorized by Vashti Hey, RN.  She was notified by Vashti Hey RN.         Prior Anticoagulation Instructions: INR 2.9 Continue coumadin 5mg  once daily except 2.5mg  on Fridays  Current Anticoagulation Instructions: INR 2.1 Continue coumadin 5mg  once daily except 2.5mg  on Fridays

## 2010-06-06 NOTE — Medication Information (Signed)
Summary: ccr-lr  Anticoagulant Therapy  Managed by: Vashti Hey, RN PCP: Dr.Edward Hoover Browns MD: Diona Browner MD, Remi Deter Indication 1: Atrial Fibrillation (ICD-427.31) Indication 2: Pulmonary Embolism and Infarction (ICD-415.1) Lab Used: [facility] [place] Site: [place] INR POC 2.0  Dietary changes: no    Health status changes: no    Bleeding/hemorrhagic complications: no    Recent/future hospitalizations: no    Any changes in medication regimen? no    Recent/future dental: no  Any missed doses?: no       Is patient compliant with meds? yes       Allergies: 1)  ! Penicillin 2)  ! * Codiene 3)  ! * Bee Sting  Anticoagulation Management History:      The patient is taking warfarin and comes in today for a routine follow up visit.  Positive risk factors for bleeding include an age of 75 years or older.  The bleeding index is 'intermediate risk'.  Positive CHADS2 values include History of HTN and Age > 75 years old.  The start date was 09/21/1999.  Anticoagulation responsible provider: Diona Browner MD, Remi Deter.  INR POC: 2.0.  Cuvette Lot#: [id number].  Exp: 10/11.    Anticoagulation Management Assessment/Plan:      The patient's current anticoagulation dose is Warfarin sodium 5 mg tabs: take one tablet daily as directed by  anticoagulation Clinic.  The target INR is 2 - 3.  The next INR is due 03/08/2010.  Anticoagulation instructions were given to patient.  Results were reviewed/authorized by Vashti Hey, RN.  She was notified by Vashti Hey RN.         Prior Anticoagulation Instructions: INR 2.1 Continue coumadin 5mg  once daily except 2.5mg  on Fridays  Current Anticoagulation Instructions: INR 2.0 Increase coumadin to 5mg  once daily

## 2010-06-06 NOTE — Procedures (Signed)
Summary: Cardiology Device Clinic      Allergies Added:   Current Medications (verified): 1)  Diltiazem Hcl Cr 240 Mg Xr24h-Cap (Diltiazem Hcl) .... Take 1 Tab Daily 2)  Warfarin Sodium 5 Mg Tabs (Warfarin Sodium) .... Take One Tablet Daily As Directed By  Anticoagulation Clinic 3)  Lopressor 100 Mg Tabs (Metoprolol Tartrate) .... Take 1/2 Tab Two Times A Day 4)  Aspir-Low 81 Mg Tbec (Aspirin) .... Take 1 Tab Daily 5)  Digoxin 0.125 Mg Tabs (Digoxin) .... Take A Half  Tablet By Mouth Daily 6)  Gabapentin 300 Mg Caps (Gabapentin) .... Take 1 At Bedtime  Allergies (verified): 1)  ! Penicillin 2)  ! * Codiene 3)  ! * Bee Sting  Vital Signs:  Patient profile:   77 year old female Pulse rate:   70 / minute BP sitting:   113 / 68  (left arm)  Vitals Entered By: Larita Fife Via LPN (April 03, 2010 2:06 PM)    PPM Specifications Following MD:  Lewayne Bunting, MD     PPM Vendor:  [employer]     PPM Model Number:  [id number]     PPM Serial Number:  [id number] PPM DOI:  09/13/1999     PPM Implanting MD:  Lewayne Bunting, MD  Lead 1    Location: RA     DOI: 09/13/1999     Model #: [id number]     Serial #: [id number]     Status: active Lead 2    Location: RV     DOI: 09/13/1999     Model #: [id number]     Serial #: [id number]     Status: active  Magnet Response Rate:  BOL 85 ERI 65  Indications:  AFIB/SSS   PPM Follow Up Remote Check?  No Battery Voltage:  2.75 V     Battery Est. Longevity:  3 years     Pacer Dependent:  No     Right Ventricle  Amplitude: 11.20 mV, Impedance: 475 ohms, Threshold: 0.5 V at 0.4 msec  Episodes Coumadin:  Yes Ventricular Pacing:  29.4%  Parameters Mode:  VVIR     Lower Rate Limit:  60     Upper Rate Limit:  120 Next Cardiology Appt Due:  09/05/2010 Tech Comments:  No parameter changes.  Device function normal.  A-fib, + coumadin.   TTM's with [employer].  ROV 6 months RDS clinic. [name], LPN  April 03, 2010 2:03 PM

## 2010-06-06 NOTE — Assessment & Plan Note (Signed)
Summary: ROV      Allergies Added:   Visit Type:  Follow-up Primary Provider:  Nehemiah Cross  CC:  no cardiology complaints.  History of Present Illness: [name] returns today for followup.  She has chronic atrial fibrillation with sick sinus syndrome and pacemaker backup.  She has a history of a PE in 2004 with chronic Coumadin. She lives alone but has neighbors that check in on her.  She has crhonic dyspnea that is likely related to her previous PE and lung disease.  She denies SSCP, syncpoe, or palpitations.  INR's have been Rx and her transtelephonic pacer check last Friday was reviewed and normal   Current Problems (verified): 1)  Essential Hypertension, Benign  (ICD-401.1) 2)  Cardiac Pacemaker in Situ  (ICD-V45.01) 3)  Bradycardia-tachycardia Syndrome  (ICD-427.81) 4)  Pulmonary Embolism  (ICD-415.19) 5)  Dvt  (ICD-453.40) 6)  Atrial Fibrillation, Chronic  (ICD-427.31)  Current Medications (verified): 1)  Diltiazem Hcl Cr 240 Mg Xr24h-Cap (Diltiazem Hcl) .... Take 1 Tab Daily 2)  Warfarin Sodium 5 Mg Tabs (Warfarin Sodium) .... Take One Tablet Daily As Directed By  Anticoagulation Clinic 3)  Lopressor 100 Mg Tabs (Metoprolol Tartrate) .... Take 1/2 Tab Two Times A Day 4)  Aspir-Low 81 Mg Tbec (Aspirin) .... Take 1 Tab Daily 5)  Digoxin 0.125 Mg Tabs (Digoxin) .... Take A Half  Tablet By Mouth Daily 6)  Gabapentin 300 Mg Caps (Gabapentin) .... Take 1 At Bedtime  Allergies (verified): 1)  ! Penicillin 2)  ! * Codiene 3)  ! * Bee Sting  Past History:  Past Medical History: Last updated: 12-Feb-2009 Current Problems:  BRADYCARDIA-TACHYCARDIA SYNDROME (ICD-427.81) PULMONARY EMBOLISM (ICD-415.19) DVT (ICD-453.40) ATRIAL FIBRILLATION, CHRONIC (ICD-427.31)  Past Surgical History: Last updated: 02/12/2009 09/13/99 perm pacer 2001 cath 2/04 craniotomy clipping unruptured left paraclinoid segment anurysum at [facility]  Family History: Last  updated: 02/12/09 Father:deceased age 60 myocardial infarction Mother:deceased age 72 myocardial infarction Siblings:2 sisters 1 age 39 1 age 81  Social History: Last updated: February 12, 2009 Retired ([employer]) Married  Tobacco Use - Yes.  Alcohol Use - no Regular Exercise - no Drug Use - no  Review of Systems       Denies fever, malais, weight loss, blurry vision, decreased visual acuity, cough, sputum, SOB, hemoptysis, pleuritic pain, palpitaitons, heartburn, abdominal pain, melena, lower extremity edema, claudication, or rash.   Vital Signs:  Patient profile:   77 year old female Weight:      225 pounds Pulse rate:   65 / minute Pulse rhythm:   irregularly irregular BP sitting:   110 / 64  (right arm)  Vitals Entered By: Dreama Saa, CNA (December 26, 2009 10:44 AM)  Physical Exam  General:  Affect appropriate Healthy:  appears stated age HEENT: normal Neck supple with no adenopathy JVP normal no bruits no thyromegaly Lungs clear with no wheezing and good diaphragmatic motion Heart:  S1/S2 no murmur,rub, gallop or click PMI normal Abdomen: benighn, BS positve, no tenderness, no AAA no bruit.  No HSM or HJR Distal pulses intact with no bruits No edema Neuro non-focal Skin warm and dry    PPM Specifications Following MD:  Lewayne Bunting, MD     PPM Vendor:  [employer]     PPM Model Number:  [id number]     PPM Serial Number:  [id number] PPM DOI:  09/13/1999     PPM Implanting MD:  Lewayne Bunting, MD  Lead 1    Location: RA  DOI: 09/13/1999     Model #: [id number]     Serial #: [id number]     Status: active Lead 2    Location: RV     DOI: 09/13/1999     Model #: [id number]     Serial #: [id number]     Status: active  Magnet Response Rate:  BOL 85 ERI 65  Indications:  AFIB/SSS   PPM Follow Up Pacer Dependent:  No      Episodes Coumadin:  Yes  Parameters Mode:  VVIR     Lower Rate Limit:  60     Upper Rate Limit:  120  Impression & Recommendations:  Problem # 1:   ESSENTIAL HYPERTENSION, BENIGN (ICD-401.1) Well contorlled Her updated medication list for this problem includes:    Diltiazem Hcl Cr 240 Mg Xr24h-cap (Diltiazem hcl) .Marland Kitchen... Take 1 tab daily    Lopressor 100 Mg Tabs (Metoprolol tartrate) .Marland Kitchen... Take 1/2 tab two times a day    Aspir-low 81 Mg Tbec (Aspirin) .Marland Kitchen... Take 1 tab daily  Problem # 2:  CARDIAC PACEMAKER IN SITU (ICD-V45.01) F/U Dr Ladona Ridgel Not pacer dependant  Problem # 3:  PULMONARY EMBOLISM (ICD-415.19) Dysnpnea unchanged on coumadin long term for PE and afib Her updated medication list for this problem includes:    Warfarin Sodium 5 Mg Tabs (Warfarin sodium) .Marland Kitchen... Take one tablet daily as directed by  anticoagulation clinic    Aspir-low 81 Mg Tbec (Aspirin) .Marland Kitchen... Take 1 tab daily  Problem # 4:  ATRIAL FIBRILLATION, CHRONIC (ICD-427.31) Good rate control.  Continue anticoagulation and F/U with Misty Stanley in 3 weeks Her updated medication list for this problem includes:    Warfarin Sodium 5 Mg Tabs (Warfarin sodium) .Marland Kitchen... Take one tablet daily as directed by  anticoagulation clinic    Lopressor 100 Mg Tabs (Metoprolol tartrate) .Marland Kitchen... Take 1/2 tab two times a day    Aspir-low 81 Mg Tbec (Aspirin) .Marland Kitchen... Take 1 tab daily    Digoxin 0.125 Mg Tabs (Digoxin) .Marland Kitchen... Take a half  tablet by mouth daily

## 2010-06-06 NOTE — Medication Information (Signed)
Summary: ccr-lr  Anticoagulant Therapy  Managed by: Tracy Hey, RN PCP: [name]: [name], [name] Indication 1: Atrial Fibrillation (ICD-427.31) Indication 2: Pulmonary Embolism and Infarction (ICD-415.1) Lab Used: [facility] [place] Site: [place] INR POC 2.1  Dietary changes: no    Health status changes: no    Bleeding/hemorrhagic complications: no    Recent/future hospitalizations: no    Any changes in medication regimen? no    Recent/future dental: no  Any missed doses?: yes     Details: might have missed 1-2 doses  Is patient compliant with meds? yes       Allergies: 1)  ! Penicillin 2)  ! * Codiene 3)  ! * Bee Sting  Anticoagulation Management History:      The patient is taking warfarin and comes in today for a routine follow up visit.  Positive risk factors for bleeding include an age of 75 years or older.  The bleeding index is 'intermediate risk'.  Positive CHADS2 values include History of HTN and Age > 62 years old.  The start date was 09/21/1999.  Anticoagulation responsible provider: Dietrich Pates Cross, [name].  INR POC: 2.1.  Cuvette Lot#: [id number].  Exp: 10/11.    Anticoagulation Management Assessment/Plan:      The patient's current anticoagulation dose is Warfarin sodium 5 mg tabs: take one tablet daily as directed by  anticoagulation Clinic.  The target INR is 2 - 3.  The next INR is due 02/08/2010.  Anticoagulation instructions were given to patient.  Results were reviewed/authorized by Tracy Hey, RN.  She was notified by Tracy Hey RN.         Prior Anticoagulation Instructions: INR 2.8 Continue coumadin 5mg  once daily except 2.5mg  on Fridays  Current Anticoagulation Instructions: INR 2.1 Continue coumadin 5mg  once daily except 2.5mg  on Fridays

## 2010-06-06 NOTE — Medication Information (Signed)
Summary: ccr-lr  Anticoagulant Therapy  Managed by: Vashti Hey, RN Supervising MD: Diona Browner MD, Remi Deter Indication 1: Atrial Fibrillation (ICD-427.31) Indication 2: Pulmonary Embolism and Infarction (ICD-415.1) Lab Used: [facility]ural [facility] [place] Site: [place] INR POC 2.1  Dietary changes: no    Health status changes: no    Bleeding/hemorrhagic complications: no    Recent/future hospitalizations: no    Any changes in medication regimen? no    Recent/future dental: no  Any missed doses?: yes     Details: missed 1 dose  Is patient compliant with meds? yes       Allergies: 1)  ! Penicillin 2)  ! * Codiene 3)  ! * Bee Sting  Anticoagulation Management History:      The patient is taking warfarin and comes in today for a routine follow up visit.  Positive risk factors for bleeding include an age of 51 years or older.  The bleeding index is 'intermediate risk'.  Positive CHADS2 values include History of HTN and Age > 58 years old.  The start date was 09/21/1999.  Anticoagulation responsible provider: Diona Browner MD, Remi Deter.  INR POC: 2.1.  Exp: 10/11.    Anticoagulation Management Assessment/Plan:      The patient's current anticoagulation dose is Warfarin sodium 5 mg tabs: take one tablet daily as directed by  anticoagulation Clinic.  The target INR is 2 - 3.  The next INR is due 10/31/2009.  Anticoagulation instructions were given to patient.  Results were reviewed/authorized by Vashti Hey, RN.  She was notified by Vashti Hey RN.         Prior Anticoagulation Instructions: INR 2.5 Continue coumadin 5mg  once daily except 2.5mg  on on Fridays  Current Anticoagulation Instructions: INR 2.1 Continue coumadin 5mg  once daily except 2.5mg  on Fridays

## 2010-06-06 NOTE — Medication Information (Signed)
Summary: ccr-lr  Anticoagulant Therapy  Managed by: Vashti Hey, RN Supervising MD: Diona Browner MD, Remi Deter Indication 1: Atrial Fibrillation (ICD-427.31) Indication 2: Pulmonary Embolism and Infarction (ICD-415.1) Lab Used: [facility]ural technologist [facility] [place] Site: [place] INR POC 1.8  Dietary changes: no    Health status changes: yes       Details: Has gout in toe  Saw Dr Juanetta Gosling Tuesday  Bleeding/hemorrhagic complications: no    Recent/future hospitalizations: no    Any changes in medication regimen? yes       Details: Started on prednisone dose pack and allopurinol  Has not started yet.  Plans to start today  Recent/future dental: no  Any missed doses?: no       Is patient compliant with meds? yes       Allergies: 1)  ! Penicillin 2)  ! * Codiene 3)  ! * Bee Sting  Anticoagulation Management History:      The patient is taking warfarin and comes in today for a routine follow up visit.  Positive risk factors for bleeding include an age of 75 years or older.  The bleeding index is 'intermediate risk'.  Positive CHADS2 values include History of HTN and Age > 75 years old.  The start date was 09/21/1999.  Anticoagulation responsible provider: Diona Browner MD, Remi Deter.  INR POC: 1.8.  Cuvette Lot#: [id number].  Exp: 10/11.    Anticoagulation Management Assessment/Plan:      The patient's current anticoagulation dose is Warfarin sodium 5 mg tabs: take one tablet daily as directed by  anticoagulation Clinic.  The target INR is 2 - 3.  The next INR is due 12/14/2009.  Anticoagulation instructions were given to patient.  Results were reviewed/authorized by Vashti Hey, RN.  She was notified by Vashti Hey RN.         Prior Anticoagulation Instructions: INR 2.5 Continue coumadin 5mg  once daily except 2.5mg  on Fridays  Current Anticoagulation Instructions: INR 1.8 Take coumadin 1 1/2 tablet tonight then resume 1 tablet once daily except 1/2 tablet on Fridays. Will not increase  weekly dose since pt is starting prednisone dose pack.  Recheck INR 12/14/09

## 2010-06-06 NOTE — Medication Information (Signed)
Summary: ccr-lr  Anticoagulant Therapy  Managed by: Vashti Hey, RN Supervising MD: Dietrich Pates MD, Molly Maduro Indication 1: Atrial Fibrillation (ICD-427.31) Indication 2: Pulmonary Embolism and Infarction (ICD-415.1) Lab Used: Architectural technologist [facility] [place] Site:  INR POC 2.5  Dietary changes: no    Health status changes: no    Bleeding/hemorrhagic complications: no    Recent/future hospitalizations: no    Any changes in medication regimen? no    Recent/future dental: no  Any missed doses?: no       Is patient compliant with meds? yes       Allergies: 1)  ! Penicillin 2)  ! * Codiene 3)  ! * Bee Sting  Anticoagulation Management History:      The patient is taking warfarin and comes in today for a routine follow up visit.  Positive risk factors for bleeding include an age of 75 years or older.  The bleeding index is 'intermediate risk'.  Positive CHADS2 values include History of HTN and Age > 29 years old.  The start date was 09/21/1999.  Anticoagulation responsible provider: Dietrich Pates MD, Molly Maduro.  INR POC: 2.5.  Cuvette Lot#: [id number].  Exp: 10/11.    Anticoagulation Management Assessment/Plan:      The patient's current anticoagulation dose is Warfarin sodium 5 mg tabs: take one tablet daily as directed by  anticoagulation Clinic.  The target INR is 2 - 3.  The next INR is due 09/29/2009.  Anticoagulation instructions were given to patient.  Results were reviewed/authorized by Vashti Hey, RN.  She was notified by Vashti Hey RN.         Prior Anticoagulation Instructions: INR 3.1 Take coumadin 2.5mg  tonight then resume 5mg  once daily except 2.5mg  on Fridays Pt switched from coumadin to warfarin 2 weeks ago  Current Anticoagulation Instructions: INR 2.5 Continue coumadin 5mg  once daily except 2.5mg  on on Fridays

## 2010-06-06 NOTE — Progress Notes (Signed)
Summary: Medication change   Phone Note Call from Patient   Caller: Patient Reason for Call: Talk to Nurse Summary of Call: pt states that she received a letter from the pharmacy stating that it would be cheaper for him to switch coumadin to warfarin/wants to know if it is okay/tg Initial call taken by: Raechel Ache [facility],  May 18, 2009 10:54 AM  Follow-up for Phone Call        OK with me. Watch INR closely. Check within 1 week of switch. Follow-up by: Gaylord Shih, MD, [facility],  May 19, 2009 12:16 PM     Appended Document: Medication change I spoke to pt, gave instructions about warfarin vs coumadin, sent in electronic rx, pt will call the day she starts warfarin and an inr will be drawn 1 week later.

## 2010-06-06 NOTE — Cardiovascular Report (Signed)
Summary: TTM   TTM   Imported By: Roderic Ovens 07/07/2009 16:11:35  _____________________________________________________________________  External Attachment:    Type:   Image     Comment:   External Document

## 2010-06-06 NOTE — Cardiovascular Report (Signed)
Summary: Transtelephonic Pacemaker Monitoring Report  Transtelephonic Pacemaker Monitoring Report   Imported By: Debby Freiberg 11/02/2009 14:09:55  _____________________________________________________________________  External Attachment:    Type:   Image     Comment:   External Document

## 2010-06-06 NOTE — Procedures (Signed)
Summary: 3 mth f/u per checkout on 04/06/09/tg      Allergies Added: ! PENICILLIN ! * CODIENE ! * BEE STING  Current Medications (verified): 1)  Diltiazem Hcl Cr 240 Mg Xr24h-Cap (Diltiazem Hcl) .... Take 1 Tab Daily 2)  Warfarin Sodium 5 Mg Tabs (Warfarin Sodium) .... Take One Tablet Daily As Directed By  Anticoagulation Clinic 3)  Lopressor 100 Mg Tabs (Metoprolol Tartrate) .... Take 1/2 Tab Two Times A Day 4)  Aspir-Low 81 Mg Tbec (Aspirin) .... Take 1 Tab Daily 5)  Digoxin 0.125 Mg Tabs (Digoxin) .... Take A Half  Tablet By Mouth Daily  Allergies (verified): 1)  ! Penicillin 2)  ! * Codiene 3)  ! * Bee Sting   PPM Specifications Following MD:  Lewayne Bunting, MD     PPM Vendor:  [employer]     PPM Model Number:  [id number]     PPM Serial Number:  [id number] PPM DOI:  09/13/1999     PPM Implanting MD:  Lewayne Bunting, MD  Lead 1    Location: RA     DOI: 09/13/1999     Model #: [id number]     Serial #: [id number]     Status: active Lead 2    Location: RV     DOI: 09/13/1999     Model #: [id number]     Serial #: [id number]     Status: active  Magnet Response Rate:  BOL 85 ERI 65  Indications:  AFIB/SSS   PPM Follow Up Remote Check?  No Battery Voltage:  2.76 V     Battery Est. Longevity:  3.5 years     Pacer Dependent:  No     Right Ventricle  Amplitude: 11.20 mV, Impedance: 503 ohms, Threshold: 0.5 V at 0.4 msec  Episodes Coumadin:  Yes Ventricular High Rate:  3     Ventricular Pacing:  26.1%  Parameters Mode:  VVIR     Lower Rate Limit:  60     Upper Rate Limit:  120 Next Cardiology Appt Due:  02/04/2010 Tech Comments:  No parameter changes.  A-fib with 34.8% heart rates > 100bpm, she is on coumadin.  3 VHR episodes lasting only 2 seconds.  ROV 6 months with Dr. Ladona Ridgel in [place]. [name], LPN  August 11, 9627 10:44 AM  MD Comments:  Agree with above.

## 2010-06-06 NOTE — Medication Information (Signed)
Summary: ccr-lr  Anticoagulant Therapy  Managed by: Vashti Hey, RN Supervising MD: Dietrich Pates MD, Molly Maduro Indication 1: Atrial Fibrillation (ICD-427.31) Indication 2: Pulmonary Embolism and Infarction (ICD-415.1) Lab Used: Architectural technologist [facility] [place] Site: [place] INR POC 3.1  Dietary changes: no    Health status changes: no    Bleeding/hemorrhagic complications: no    Recent/future hospitalizations: no    Any changes in medication regimen? yes       Details: changes from coumadin to warfarin 2 weeks ago  Recent/future dental: no  Any missed doses?: no       Is patient compliant with meds? yes       Allergies: No Known Drug Allergies  Anticoagulation Management History:      The patient is taking warfarin and comes in today for a routine follow up visit.  Positive risk factors for bleeding include an age of 75 years or older.  The bleeding index is 'intermediate risk'.  Positive CHADS2 values include History of HTN and Age > 75 years old.  The start date was 09/21/1999.  Anticoagulation responsible provider: Dietrich Pates MD, Molly Maduro.  INR POC: 3.1.  Cuvette Lot#: [id number].  Exp: 10/11.    Anticoagulation Management Assessment/Plan:      The patient's current anticoagulation dose is Warfarin sodium 5 mg tabs: take one tablet daily as directed by  anticoagulation Clinic.  The target INR is 2 - 3.  The next INR is due 09/01/2009.  Anticoagulation instructions were given to patient.  Results were reviewed/authorized by Vashti Hey, RN.  She was notified by Vashti Hey RN.         Prior Anticoagulation Instructions: INR 3.0 Continue coumadin 5mg  once daily except 2.5mg  on Fridays  Current Anticoagulation Instructions: INR 3.1 Take coumadin 2.5mg  tonight then resume 5mg  once daily except 2.5mg  on Fridays Pt switched from coumadin to warfarin 2 weeks ago

## 2010-06-06 NOTE — Medication Information (Signed)
Summary: ccr-lr  Anticoagulant Therapy  Managed by: Vashti Hey, RN Supervising MD: Dietrich Pates MD, Molly Maduro Indication 1: Atrial Fibrillation (ICD-427.31) Indication 2: Pulmonary Embolism and Infarction (ICD-415.1) Lab Used: Architectural technologist [facility] [place] Site: [place] INR POC 2.5  Dietary changes: no    Health status changes: no    Bleeding/hemorrhagic complications: no    Recent/future hospitalizations: no    Any changes in medication regimen? no    Recent/future dental: no  Any missed doses?: no       Is patient compliant with meds? yes       Allergies: 1)  ! Penicillin 2)  ! * Codiene 3)  ! * Bee Sting  Anticoagulation Management History:      The patient is taking warfarin and comes in today for a routine follow up visit.  Positive risk factors for bleeding include an age of 75 years or older.  The bleeding index is 'intermediate risk'.  Positive CHADS2 values include History of HTN and Age > 29 years old.  The start date was 09/21/1999.  Anticoagulation responsible provider: Dietrich Pates MD, Molly Maduro.  INR POC: 2.5.  Cuvette Lot#: [id number].  Exp: 10/11.    Anticoagulation Management Assessment/Plan:      The patient's current anticoagulation dose is Warfarin sodium 5 mg tabs: take one tablet daily as directed by  anticoagulation Clinic.  The target INR is 2 - 3.  The next INR is due 11/28/2009.  Anticoagulation instructions were given to patient.  Results were reviewed/authorized by Vashti Hey, RN.  She was notified by Vashti Hey RN.         Prior Anticoagulation Instructions: INR 2.1 Continue coumadin 5mg  once daily except 2.5mg  on Fridays  Current Anticoagulation Instructions: INR 2.5 Continue coumadin 5mg  once daily except 2.5mg  on Fridays

## 2010-06-08 NOTE — Medication Information (Signed)
Summary: ccr-lr  Anticoagulant Therapy  Managed by: Vashti Hey, RN PCP: Dr.Edward Hoover Browns MD: Dietrich Pates MD, Molly Maduro Indication 1: Atrial Fibrillation (ICD-427.31) Indication 2: Pulmonary Embolism and Infarction (ICD-415.1) Lab Used: Architectural technologist [facility] [place] Site: [place] INR POC 2.5  Dietary changes: no    Health status changes: no    Bleeding/hemorrhagic complications: no    Recent/future hospitalizations: no    Any changes in medication regimen? no    Recent/future dental: no  Any missed doses?: yes     Details: missed 1-2 doses  Is patient compliant with meds? yes       Allergies: 1)  ! Penicillin 2)  ! * Codiene 3)  ! * Bee Sting  Anticoagulation Management History:      The patient is taking warfarin and comes in today for a routine follow up visit.  Positive risk factors for bleeding include an age of 75 years or older.  The bleeding index is 'intermediate risk'.  Positive CHADS2 values include History of HTN and Age > 63 years old.  The start date was 09/21/1999.  Anticoagulation responsible provider: Dietrich Pates MD, Molly Maduro.  INR POC: 2.5.  Cuvette Lot#: [id number].  Exp: 10/11.    Anticoagulation Management Assessment/Plan:      The patient's current anticoagulation dose is Warfarin sodium 5 mg tabs: take one tablet daily as directed by  anticoagulation Clinic.  The target INR is 2 - 3.  The next INR is due 05/31/2010.  Anticoagulation instructions were given to patient.  Results were reviewed/authorized by Vashti Hey, RN.  She was notified by Vashti Hey RN.         Prior Anticoagulation Instructions: INR 2.7 Continue coumadin 5mg  once daily   Current Anticoagulation Instructions: INR 2.5 Continue coumadin 5mg  once daily

## 2010-06-08 NOTE — Medication Information (Signed)
Summary: ccr-lr  Anticoagulant Therapy  Managed by: Vashti Hey, RN PCP: Dr.Edward Hoover Browns MD: Diona Browner MD, Remi Deter Indication 1: Atrial Fibrillation (ICD-427.31) Indication 2: Pulmonary Embolism and Infarction (ICD-415.1) Lab Used: Architectural technologist [facility] [place] Site: [place] INR POC 2.8  Dietary changes: no    Health status changes: no    Bleeding/hemorrhagic complications: no    Recent/future hospitalizations: no    Any changes in medication regimen? no    Recent/future dental: no  Any missed doses?: no       Is patient compliant with meds? yes       Allergies: 1)  ! Penicillin 2)  ! * Codiene 3)  ! * Bee Sting  Anticoagulation Management History:      The patient is taking warfarin and comes in today for a routine follow up visit.  Positive risk factors for bleeding include an age of 75 years or older.  The bleeding index is 'intermediate risk'.  Positive CHADS2 values include History of HTN and Age > 55 years old.  The start date was 09/21/1999.  Anticoagulation responsible provider: Diona Browner MD, Remi Deter.  INR POC: 2.8.  Cuvette Lot#: [id number].  Exp: 10/11.    Anticoagulation Management Assessment/Plan:      The patient's current anticoagulation dose is Warfarin sodium 5 mg tabs: take one tablet daily as directed by  anticoagulation Clinic.  The target INR is 2 - 3.  The next INR is due 06/28/2010.  Anticoagulation instructions were given to patient.  Results were reviewed/authorized by Vashti Hey, RN.  She was notified by Vashti Hey RN.         Prior Anticoagulation Instructions: INR 2.5 Continue coumadin 5mg  once daily   Current Anticoagulation Instructions: INR 2.8 Continue coumadin 5mg  once daily

## 2010-06-15 ENCOUNTER — Encounter: Payer: Self-pay | Admitting: Adult Health

## 2010-06-15 ENCOUNTER — Ambulatory Visit (INDEPENDENT_AMBULATORY_CARE_PROVIDER_SITE_OTHER): Payer: Medicare Other | Admitting: Adult Health

## 2010-06-15 ENCOUNTER — Encounter: Payer: Self-pay | Admitting: Cardiology

## 2010-06-15 DIAGNOSIS — I951 Orthostatic hypotension: Secondary | ICD-10-CM | POA: Insufficient documentation

## 2010-06-15 DIAGNOSIS — I4891 Unspecified atrial fibrillation: Secondary | ICD-10-CM

## 2010-06-16 ENCOUNTER — Encounter: Payer: Self-pay | Admitting: Adult Health

## 2010-06-21 ENCOUNTER — Ambulatory Visit (INDEPENDENT_AMBULATORY_CARE_PROVIDER_SITE_OTHER): Payer: Medicare Other

## 2010-06-21 ENCOUNTER — Encounter (INDEPENDENT_AMBULATORY_CARE_PROVIDER_SITE_OTHER): Payer: Self-pay | Admitting: *Deleted

## 2010-06-21 DIAGNOSIS — R195 Other fecal abnormalities: Secondary | ICD-10-CM

## 2010-06-21 LAB — CONVERTED CEMR LAB
OCCULT 1: NEGATIVE
OCCULT 2: NEGATIVE
OCCULT 3: NEGATIVE

## 2010-06-22 ENCOUNTER — Encounter (INDEPENDENT_AMBULATORY_CARE_PROVIDER_SITE_OTHER): Payer: Self-pay | Admitting: *Deleted

## 2010-06-22 NOTE — Letter (Signed)
Summary: orthostatic bp  orthostatic bp   Imported By: Faythe Ghee 06/15/2010 15:43:03  _____________________________________________________________________  External Attachment:    Type:   Image     Comment:   External Document

## 2010-06-22 NOTE — Assessment & Plan Note (Signed)
Summary: 6 MTH F/U PER CHECKOUT ON 12/26/09/TG/AMD  Medications Added METOPROLOL TARTRATE 25 MG TABS (METOPROLOL TARTRATE) Take one tablet by mouth twice a day ALLOPURINOL 300 MG TABS (ALLOPURINOL) take 1 tab daily      Allergies Added:   Visit Type:  Follow-up Primary Provider:  Dr.Edward Juanetta Gosling   History of Present Illness: [name] is a 77 y/o obese CF we are following for atrial fibrillation.  She also has a history of SSS tachy/brady with placement of Medtronic dual chamber pacemaker placed by Dr. Ladona Ridgel in 2001, PE and DVT's.  She is on coumadin and is followed in our office for PT with INR checks. INR today, 1.7.  She comes today with multiple somatic complaints from the pain in her knees which is chronic, thyroid issues, to the dry places on her hands and arms.  She is also complaining about positional dizziness and also some bleeding when she has a bowel movement.  Orthostatics are completed in clinic today and are positive  dropping from 118/72 to 100/65 with marked dizziness.  She says that she has not normally this way, but in the past when she had this happen her pacemaker was adjusted because the rate was not correct.  I am uncertain of those details.  She is very anixious about these symptoms. She is due for a follow-up pacemaker check on Feb 20th in our office. She denies chest pain, shortness of breath, but has complaints of weakness and fatigue limiting her activities.  Current Medications (verified): 1)  Diltiazem Hcl Cr 240 Mg Xr24h-Cap (Diltiazem Hcl) .... Take 1 Tab Daily 2)  Warfarin Sodium 5 Mg Tabs (Warfarin Sodium) .... Take One Tablet Daily As Directed By  Anticoagulation Clinic 3)  Metoprolol Tartrate 25 Mg Tabs (Metoprolol Tartrate) .... Take One Tablet By Mouth Twice A Day 4)  Aspir-Low 81 Mg Tbec (Aspirin) .... Take 1 Tab Daily 5)  Digoxin 0.125 Mg Tabs (Digoxin) .... Take A Half  Tablet By Mouth Daily 6)  Gabapentin 300 Mg Caps (Gabapentin) .... Take 1 At  Bedtime 7)  Allopurinol 300 Mg Tabs (Allopurinol) .... Take 1 Tab Daily  Allergies (verified): 1)  ! Penicillin 2)  ! * Codiene 3)  ! * Bee Sting  Comments:  Nurse/Medical Assistant: patient brought med list she [facility] lopressor is 100 mg 1/2 tab two times a day   Past History:  Past medical, surgical, family and social histories (including risk factors) reviewed, and no changes noted (except as noted below).  Past Medical History: Reviewed history from 01/18/2009 and no changes required. 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 Father:deceased age 69 myocardial infarction Mother:deceased age 72 myocardial infarction Siblings:2 sisters 1 age 64 1 age 73  Social History: Reviewed history from 01/18/2009 and no changes required. Retired Market researcher) Married  Tobacco Use - Yes.  Alcohol Use - no Regular Exercise - no Drug Use - no  Review of Systems       Dizzness, weakness, fatigue, bleeding.  All other systems have been reviewed and are negative unless stated above.   Vital Signs:  Patient profile:   77 year old female Weight:      226 pounds BMI:  30.76 O2 Sat:      96 % on Room air Pulse rate:   66 / minute BP sitting:   109 / 62  (left arm)  Vitals Entered By: Dreama Saa, CNA (June 15, 2010 11:25 AM)  O2 Flow:  Room air  Physical Exam  General:  normal appearance.   Lungs:  Clear bilaterally to auscultation and percussion. Heart:  Non-displaced PMI, chest non-tender; regular rate and rhythm, S1, S2 without murmurs, rubs or  gallops. Carotid upstroke normal, no bruit. Normal abdominal aortic size, no bruits. Femorals normal pulses, no bruits. Pedals normal pulses. No edema, no varicosities. Abdomen:  Bowel sounds positive; abdomen soft and non-tender without masses, organomegaly, or hernias noted. No hepatosplenomegaly. Msk:  Back normal, normal gait. Muscle strength and tone normal. Complains of chronic neck and shoulder pain with movement and position change on exam table. Pulses:  pulses normal in all 4 extremities Extremities:  No clubbing or cyanosis. Neurologic:  Alert and oriented x 3. Skin:  Several raised dry places on hands and arm, flesh colored. Psych:  anxious.     EKG  Procedure date:  06/15/2010  Findings:       Atrium and ventricle are paced.    PPM Specifications Following MD:  Lewayne Bunting, MD     PPM Vendor:  Medtronic     PPM Model Number:  [id number]     PPM Serial Number:  [id number] PPM DOI:  09/13/1999     PPM Implanting MD:  Lewayne Bunting, MD  Lead 1    Location: RA     DOI: 09/13/1999     Model #: [id number]     Serial #: [id number]     Status: active Lead 2    Location: RV     DOI: 09/13/1999     Model #: [id number]     Serial #: [id number]     Status: active  Magnet Response Rate:  BOL 85 ERI 65  Indications:  AFIB/SSS   PPM Follow Up Pacer Dependent:  No      Episodes Coumadin:  Yes  Parameters Mode:  VVIR     Cross Rate Limit:  60     Upper Rate Limit:  120  Impression & Recommendations:  Problem # 1:  HYPOTENSION, ORTHOSTATIC (ICD-458.0) I have planned for her to have TED hose, decrease lopressor from 50mg  two times a day to 25mg  two times a day.  Do not want to decrease cardiazem dose at this time, to keep her rate controlled.  She is predominately paced in the office per EKG.  I will check CBC to evaluate for anemia in the setting of bleeding from her rectum.  She is given hemoccult cards to take home.  She will be referred to Dr. Rehman-Gastroenterologist for follow-up in this  setting.  May need colonoscopy.  Will not hold coumadin at this time unless hemoccults are very positive at the recommendation of GI or if she is very anemic. INR is 1.7 today.  Problem # 2:  ATRIAL FIBRILLATION, CHRONIC (ICD-427.31) Rate is controlled at present.  Will have her come to follow-up pacemaker clinic as planned.  See above plans concerning coumadin. Her updated medication list for this problem includes:    Warfarin Sodium 5 Mg Tabs (Warfarin sodium) .Marland Kitchen... Take one tablet daily as directed by  anticoagulation clinic    Metoprolol Tartrate 25 Mg Tabs (Metoprolol tartrate) .Marland Kitchen... Take one tablet by mouth twice a day    Aspir-low 81  Mg Tbec (Aspirin) .Marland Kitchen... Take 1 tab daily    Digoxin 0.125 Mg Tabs (Digoxin) .Marland Kitchen... Take a half  tablet by mouth daily  Orders: T-CBC w/Diff ([id number]) T-Digoxin [phone])  Other Orders: Hemoccult Cards (Take Home) (Hemoccult Cards) Gastroenterology Referral (GI) Durable Medical Equipment (DME)  Patient Instructions: 1)  Your physician recommends that you schedule a follow-up appointment in: 6 months 2)  Your physician recommends that you return for lab work in: today 3)  Your physician has recommended you make the following change in your medication: decrease metoprolol tartrate to 25mg  two times a day 4)  You have been referred to Dr. Karilyn Cota for rectal bleeding 5)  Your physician has asked that you test your stool for blood. It is necessary to test 3 different stool specimens for accuracy. You will be given 3 hemoccult cards for specimen collection. For each stool specimen, place a small portion of stool sample (from 2 different areas of the stool) into the 2 squares on the card. Close card. Repeat with 2 more stool specimens. Bring the cards back to the office for testing. 6)  knee high ted hose wear daily, off 2 hrs daily Prescriptions: METOPROLOL TARTRATE 25 MG TABS (METOPROLOL TARTRATE) Take one tablet by mouth twice a day  #60 x 3   Entered  by:   Tracy Lower RN   Authorized by:   Tracy Reining, NP   Signed by:   Tracy Lower RN on 06/15/2010   Method used:   Electronically to        [facility]* (retail)       [address]       [place], [place]  [address]       Ph: [phone]       Fax: [phone]   RxID:   [id number]

## 2010-06-23 ENCOUNTER — Encounter: Payer: Self-pay | Admitting: Internal Medicine

## 2010-06-23 DIAGNOSIS — I495 Sick sinus syndrome: Secondary | ICD-10-CM

## 2010-06-27 ENCOUNTER — Telehealth (INDEPENDENT_AMBULATORY_CARE_PROVIDER_SITE_OTHER): Payer: Self-pay | Admitting: *Deleted

## 2010-06-27 LAB — CONVERTED CEMR LAB
Basophils Absolute: 0.1 10*3/uL (ref 0.0–0.1)
Basophils Relative: 1 % (ref 0–1)
Digitoxin Lvl: 0.5 ng/mL — ABNORMAL LOW (ref 0.8–2.0)
Eosinophils Absolute: 0.1 10*3/uL (ref 0.0–0.7)
Eosinophils Relative: 2 % (ref 0–5)
HCT: 43.7 % (ref 36.0–46.0)
Hemoglobin: 14.8 g/dL (ref 12.0–15.0)
Lymphocytes Relative: 27 % (ref 12–46)
Lymphs Abs: 2 10*3/uL (ref 0.7–4.0)
MCHC: 33.9 g/dL (ref 30.0–36.0)
MCV: 99.1 fL (ref 78.0–100.0)
Monocytes Absolute: 0.6 10*3/uL (ref 0.1–1.0)
Monocytes Relative: 9 % (ref 3–12)
Neutro Abs: 4.7 10*3/uL (ref 1.7–7.7)
Neutrophils Relative %: 62 % (ref 43–77)
Platelets: 183 10*3/uL (ref 150–400)
RBC: 4.41 M/uL (ref 3.87–5.11)
RDW: 13.8 % (ref 11.5–15.5)
WBC: 7.5 10*3/uL (ref 4.0–10.5)

## 2010-06-28 ENCOUNTER — Encounter: Payer: Self-pay | Admitting: Cardiology

## 2010-06-28 ENCOUNTER — Encounter (INDEPENDENT_AMBULATORY_CARE_PROVIDER_SITE_OTHER): Payer: Medicare Other

## 2010-06-28 DIAGNOSIS — Z7901 Long term (current) use of anticoagulants: Secondary | ICD-10-CM

## 2010-06-28 DIAGNOSIS — I4891 Unspecified atrial fibrillation: Secondary | ICD-10-CM

## 2010-06-28 LAB — CONVERTED CEMR LAB: POC INR: 2.9

## 2010-06-28 NOTE — Miscellaneous (Signed)
Summary: HEMOCCULT CARDS 06/21/2010  Clinical Lists Changes  Observations: Added new observation of HEMOCCULT 3: NEG (06/21/2010 11:36) Added new observation of HEMOCCULT 2: NEG (06/21/2010 11:36) Added new observation of HEMOCCULT 1: NEG (06/21/2010 11:36)

## 2010-07-04 NOTE — Progress Notes (Signed)
  Medications Added DIGOXIN 0.125 MG TABS (DIGOXIN) Take 1 tablet by mouth once a day       Phone Note Call from Patient   Caller: Patient Call For: bad cold Summary of Call: pt would like something for her cold she will be here at 10:30am tomorrow for ccr check Initial call taken by: Teressa Lower RN,  June 27, 2010 4:32 PM  Follow-up for Phone Call        discussed with KL z pak for this pak Follow-up by: Teressa Lower RN,  June 27, 2010 4:34 PM  Additional Follow-up for Phone Call Additional follow up Details #1::        Pt has no fever, no congestion, her inr is 2.9, asked pt to get mucinex otc Additional Follow-up by: Teressa Lower RN,  June 28, 2010 10:41 AM    New/Updated Medications: DIGOXIN 0.125 MG TABS (DIGOXIN) Take 1 tablet by mouth once a day

## 2010-07-04 NOTE — Medication Information (Signed)
Summary: ccr-lr  Anticoagulant Therapy  Managed by: Vashti Hey, RN PCP: Dr.Edward Hoover Browns MD: Diona Browner MD, Remi Deter Indication 1: Atrial Fibrillation (ICD-427.31) Indication 2: Pulmonary Embolism and Infarction (ICD-415.1) Lab Used: [facility] [place] Site: [place] INR POC 2.9  Dietary changes: no    Health status changes: no    Bleeding/hemorrhagic complications: no    Recent/future hospitalizations: no    Any changes in medication regimen? no    Recent/future dental: no  Any missed doses?: no       Is patient compliant with meds? yes       Allergies: 1)  ! Penicillin 2)  ! * Codiene 3)  ! * Bee Sting  Anticoagulation Management History:      The patient is taking warfarin and comes in today for a routine follow up visit.  Positive risk factors for bleeding include an age of 60 years or older.  The bleeding index is 'intermediate risk'.  Positive CHADS2 values include History of HTN and Age > 16 years old.  The start date was 09/21/1999.  Anticoagulation responsible provider: Diona Browner MD, Remi Deter.  INR POC: 2.9.  Cuvette Lot#: [id number].  Exp: 10/11.    Anticoagulation Management Assessment/Plan:      The patient's current anticoagulation dose is Warfarin sodium 5 mg tabs: take one tablet daily as directed by  anticoagulation Clinic.  The target INR is 2 - 3.  The next INR is due 07/26/2010.  Anticoagulation instructions were given to patient.  Results were reviewed/authorized by Vashti Hey, RN.  She was notified by Vashti Hey RN.         Prior Anticoagulation Instructions: INR 2.8 Continue coumadin 5mg  once daily   Current Anticoagulation Instructions: INR 2.9 Continue coumadin 5mg  once daily

## 2010-07-10 ENCOUNTER — Telehealth (INDEPENDENT_AMBULATORY_CARE_PROVIDER_SITE_OTHER): Payer: Self-pay

## 2010-07-12 ENCOUNTER — Telehealth (INDEPENDENT_AMBULATORY_CARE_PROVIDER_SITE_OTHER): Payer: Self-pay

## 2010-07-17 ENCOUNTER — Telehealth: Payer: Self-pay

## 2010-07-18 NOTE — Progress Notes (Signed)
Summary: PT NEEDS UPDATED LIST OF MEDICATIONS   Phone Note Call from Patient Call back at Home Phone [phone]   Caller: PT Reason for Call: Talk to Nurse Summary of Call: PT CANNOT REMEMBER WHAT MEDS SHE IS ON SINCE THEY WERE CHANGED, COULD WE SENT HER A UPDATED LIST? Initial call taken by: Faythe Ghee,  July 12, 2010 3:09 PM  Follow-up for Phone Call        Medications list mailed to pt's address. Follow-up by: Larita Fife Via LPN,  July 12, 2010 5:16 PM

## 2010-07-18 NOTE — Cardiovascular Report (Signed)
Summary: TTM   TTM   Imported By: Roderic Ovens 07/14/2010 14:51:50  _____________________________________________________________________  External Attachment:    Type:   Image     Comment:   External Document

## 2010-07-18 NOTE — Progress Notes (Signed)
Summary: pt need to know what over the counter meds to take   Phone Note Call from Patient Call back at Home Phone [phone]   Caller: pt Reason for Call: Talk to Nurse Complaint: Earache/Ear Infection Summary of Call: pt needs to know what over the counter meds she can take with the medications she is on. Initial call taken by: Faythe Ghee,  July 10, 2010 1:12 PM  Follow-up for Phone Call        Pt. advised to take OTC Chloracidin, Guaifensin or Mucinex, she expressed verbal understanding. Follow-up by: Larita Fife Via LPN,  July 10, 2010 1:25 PM

## 2010-07-22 ENCOUNTER — Encounter: Payer: Self-pay | Admitting: Cardiovascular Disease

## 2010-07-22 DIAGNOSIS — I2699 Other pulmonary embolism without acute cor pulmonale: Secondary | ICD-10-CM

## 2010-07-22 DIAGNOSIS — I82409 Acute embolism and thrombosis of unspecified deep veins of unspecified lower extremity: Secondary | ICD-10-CM

## 2010-07-22 DIAGNOSIS — Z7901 Long term (current) use of anticoagulants: Secondary | ICD-10-CM | POA: Insufficient documentation

## 2010-07-22 DIAGNOSIS — I4891 Unspecified atrial fibrillation: Secondary | ICD-10-CM

## 2010-07-25 NOTE — Progress Notes (Signed)
Summary: NEEDS NEW RX CALLED IN SHE IS OUT OF PILLS  Medications Added DIGOXIN 0.125 MG TABS (DIGOXIN) Take 1 tablet by mouth once a day       Phone Note Call from Patient Call back at Home Phone [phone]   Caller: PT Reason for Call: Talk to Nurse Summary of Call: PT NEEDS TO GET NEW RX CALLED IN FOR DIGOXIN SINCE IT WAS INCREASED SHE USES [facility]. SHE ALSO NEEDS TO KNOW WHAT MEDICATIONS SHE CAN TAKE FOR A COLD. Initial call taken by: Faythe Ghee,  July 17, 2010 11:11 AM  Follow-up for Phone Call        No answer, no way to leave message. Will continue to call. Follow-up by: Larita Fife Via LPN,  July 17, 2010 11:32 AM  Additional Follow-up for Phone Call Additional follow up Details #1::        Pt. advised to try Chloracidin OTC for her cold s/s and that the RX for Digoxin has [facility]. She states she understands instuctions given. Additional Follow-up by: Larita Fife Via LPN,  July 17, 2010 2:20 PM    New/Updated Medications: DIGOXIN 0.125 MG TABS (DIGOXIN) Take 1 tablet by mouth once a day Prescriptions: DIGOXIN 0.125 MG TABS (DIGOXIN) Take 1 tablet by mouth once a day  #30 x 3   Entered by:   Larita Fife Via LPN   Authorized by:   Joni Reining, NP   Signed by:   Larita Fife Via LPN on 62/13/0865   Method used:   [facility] to        [id number]       [address]       [place], [place]  [address]       Ph: [phone]       Fax: [phone]   RxID:   [id number]

## 2010-07-26 ENCOUNTER — Ambulatory Visit (INDEPENDENT_AMBULATORY_CARE_PROVIDER_SITE_OTHER): Payer: Medicare Other | Admitting: *Deleted

## 2010-07-26 DIAGNOSIS — I4891 Unspecified atrial fibrillation: Secondary | ICD-10-CM

## 2010-07-26 DIAGNOSIS — Z7901 Long term (current) use of anticoagulants: Secondary | ICD-10-CM

## 2010-07-26 DIAGNOSIS — I82409 Acute embolism and thrombosis of unspecified deep veins of unspecified lower extremity: Secondary | ICD-10-CM

## 2010-07-26 DIAGNOSIS — I2699 Other pulmonary embolism without acute cor pulmonale: Secondary | ICD-10-CM

## 2010-07-26 LAB — POCT INR: INR: 2

## 2010-07-31 ENCOUNTER — Ambulatory Visit (INDEPENDENT_AMBULATORY_CARE_PROVIDER_SITE_OTHER): Payer: Medicare Other | Admitting: Internal Medicine

## 2010-08-23 ENCOUNTER — Emergency Department (HOSPITAL_COMMUNITY): Payer: Medicare Other

## 2010-08-23 ENCOUNTER — Inpatient Hospital Stay (HOSPITAL_COMMUNITY)
Admission: AD | Admit: 2010-08-23 | Discharge: 2010-09-05 | DRG: 417 | Disposition: A | Discharge status: Skilled Nursing Facility | Payer: Medicare Other | Source: Ambulatory Visit | Attending: Pulmonary Disease | Admitting: Pulmonary Disease

## 2010-08-23 ENCOUNTER — Ambulatory Visit (INDEPENDENT_AMBULATORY_CARE_PROVIDER_SITE_OTHER): Payer: Medicare Other | Admitting: *Deleted

## 2010-08-23 DIAGNOSIS — K8000 Calculus of gallbladder with acute cholecystitis without obstruction: Principal | ICD-10-CM | POA: Diagnosis present

## 2010-08-23 DIAGNOSIS — K8309 Other cholangitis: Secondary | ICD-10-CM | POA: Diagnosis present

## 2010-08-23 DIAGNOSIS — J449 Chronic obstructive pulmonary disease, unspecified: Secondary | ICD-10-CM | POA: Diagnosis present

## 2010-08-23 DIAGNOSIS — J9600 Acute respiratory failure, unspecified whether with hypoxia or hypercapnia: Secondary | ICD-10-CM | POA: Diagnosis not present

## 2010-08-23 DIAGNOSIS — I4891 Unspecified atrial fibrillation: Secondary | ICD-10-CM

## 2010-08-23 DIAGNOSIS — J4489 Other specified chronic obstructive pulmonary disease: Secondary | ICD-10-CM | POA: Diagnosis present

## 2010-08-23 DIAGNOSIS — I2510 Atherosclerotic heart disease of native coronary artery without angina pectoris: Secondary | ICD-10-CM | POA: Diagnosis present

## 2010-08-23 DIAGNOSIS — Z86718 Personal history of other venous thrombosis and embolism: Secondary | ICD-10-CM

## 2010-08-23 DIAGNOSIS — I82409 Acute embolism and thrombosis of unspecified deep veins of unspecified lower extremity: Secondary | ICD-10-CM

## 2010-08-23 DIAGNOSIS — R791 Abnormal coagulation profile: Secondary | ICD-10-CM | POA: Diagnosis present

## 2010-08-23 DIAGNOSIS — E785 Hyperlipidemia, unspecified: Secondary | ICD-10-CM | POA: Diagnosis present

## 2010-08-23 DIAGNOSIS — Z7901 Long term (current) use of anticoagulants: Secondary | ICD-10-CM

## 2010-08-23 DIAGNOSIS — A419 Sepsis, unspecified organism: Secondary | ICD-10-CM | POA: Diagnosis not present

## 2010-08-23 DIAGNOSIS — Z880 Allergy status to penicillin: Secondary | ICD-10-CM

## 2010-08-23 DIAGNOSIS — I2699 Other pulmonary embolism without acute cor pulmonale: Secondary | ICD-10-CM

## 2010-08-23 DIAGNOSIS — T45515A Adverse effect of anticoagulants, initial encounter: Secondary | ICD-10-CM | POA: Diagnosis present

## 2010-08-23 DIAGNOSIS — K7689 Other specified diseases of liver: Secondary | ICD-10-CM | POA: Diagnosis present

## 2010-08-23 LAB — DIFFERENTIAL
Basophils Absolute: 0 10*3/uL (ref 0.0–0.1)
Basophils Relative: 0 % (ref 0–1)
Eosinophils Absolute: 0 10*3/uL (ref 0.0–0.7)
Eosinophils Relative: 0 % (ref 0–5)
Lymphocytes Relative: 11 % — ABNORMAL LOW (ref 12–46)
Lymphs Abs: 0.9 10*3/uL (ref 0.7–4.0)
Monocytes Absolute: 0.3 10*3/uL (ref 0.1–1.0)
Monocytes Relative: 4 % (ref 3–12)
Neutro Abs: 7 10*3/uL (ref 1.7–7.7)
Neutrophils Relative %: 85 % — ABNORMAL HIGH (ref 43–77)

## 2010-08-23 LAB — CBC
HCT: 42 % (ref 36.0–46.0)
Hemoglobin: 14.1 g/dL (ref 12.0–15.0)
MCH: 34.1 pg — ABNORMAL HIGH (ref 26.0–34.0)
MCHC: 33.6 g/dL (ref 30.0–36.0)
MCV: 101.4 fL — ABNORMAL HIGH (ref 78.0–100.0)
Platelets: 139 10*3/uL — ABNORMAL LOW (ref 150–400)
RBC: 4.14 MIL/uL (ref 3.87–5.11)
RDW: 14.5 % (ref 11.5–15.5)
WBC: 8.3 10*3/uL (ref 4.0–10.5)

## 2010-08-23 LAB — COMPREHENSIVE METABOLIC PANEL
ALT: 144 U/L — ABNORMAL HIGH (ref 0–35)
AST: 194 U/L — ABNORMAL HIGH (ref 0–37)
Albumin: 3.6 g/dL (ref 3.5–5.2)
Alkaline Phosphatase: 98 U/L (ref 39–117)
BUN: 13 mg/dL (ref 6–23)
CO2: 26 mEq/L (ref 19–32)
Calcium: 9.1 mg/dL (ref 8.4–10.5)
Chloride: 102 mEq/L (ref 96–112)
Creatinine, Ser: 0.83 mg/dL (ref 0.4–1.2)
GFR calc Af Amer: >60 mL/min (ref >60)
GFR calc non Af Amer: >60 mL/min (ref >60)
Glucose, Bld: 137 mg/dL — ABNORMAL HIGH (ref 70–99)
Potassium: 3.9 mEq/L (ref 3.5–5.1)
Sodium: 139 mEq/L (ref 135–145)
Total Bilirubin: 3 mg/dL — ABNORMAL HIGH (ref 0.3–1.2)
Total Protein: 7.1 g/dL (ref 6.0–8.3)

## 2010-08-23 LAB — POCT INR: INR: 2.7

## 2010-08-24 ENCOUNTER — Inpatient Hospital Stay (HOSPITAL_COMMUNITY): Payer: Medicare Other

## 2010-08-24 ENCOUNTER — Emergency Department (HOSPITAL_COMMUNITY): Payer: Medicare Other

## 2010-08-24 DIAGNOSIS — K838 Other specified diseases of biliary tract: Secondary | ICD-10-CM

## 2010-08-24 DIAGNOSIS — K8020 Calculus of gallbladder without cholecystitis without obstruction: Secondary | ICD-10-CM

## 2010-08-24 LAB — GLUCOSE, CAPILLARY: Glucose-Capillary: 140 mg/dL — ABNORMAL HIGH (ref 70–99)

## 2010-08-24 LAB — CARDIAC PANEL(CRET KIN+CKTOT+MB+TROPI)
CK, MB: 2 ng/mL (ref 0.3–4.0)
Relative Index: 1.9 (ref 0.0–2.5)
Total CK: 105 U/L (ref 7–177)
Troponin I: 0.04 ng/mL (ref 0.00–0.06)

## 2010-08-24 LAB — CBC
HCT: 42.7 % (ref 36.0–46.0)
Hemoglobin: 14.1 g/dL (ref 12.0–15.0)
MCH: 34.1 pg — ABNORMAL HIGH (ref 26.0–34.0)
MCHC: 33 g/dL (ref 30.0–36.0)
MCV: 103.1 fL — ABNORMAL HIGH (ref 78.0–100.0)
Platelets: 111 10*3/uL — ABNORMAL LOW (ref 150–400)
RBC: 4.14 MIL/uL (ref 3.87–5.11)
RDW: 14.7 % (ref 11.5–15.5)
WBC: 19.1 10*3/uL — ABNORMAL HIGH (ref 4.0–10.5)

## 2010-08-24 LAB — URINALYSIS, ROUTINE W REFLEX MICROSCOPIC
Bilirubin Urine: NEGATIVE
Glucose, UA: NEGATIVE mg/dL
Glucose, UA: 100 mg/dL — AB
Hgb urine dipstick: NEGATIVE
Ketones, ur: NEGATIVE mg/dL
Nitrite: POSITIVE — AB
Nitrite: NEGATIVE
Protein, ur: NEGATIVE mg/dL
Protein, ur: NEGATIVE mg/dL
Specific Gravity, Urine: 1.015 (ref 1.005–1.030)
Specific Gravity, Urine: 1.01 (ref 1.005–1.030)
Urobilinogen, UA: 0.2 mg/dL (ref 0.0–1.0)
Urobilinogen, UA: >8 mg/dL — ABNORMAL HIGH (ref 0.0–1.0)
pH: 6 (ref 5.0–8.0)
pH: 5 (ref 5.0–8.0)

## 2010-08-24 LAB — COMPREHENSIVE METABOLIC PANEL
ALT: 169 U/L — ABNORMAL HIGH (ref 0–35)
AST: 188 U/L — ABNORMAL HIGH (ref 0–37)
Albumin: 2.9 g/dL — ABNORMAL LOW (ref 3.5–5.2)
Alkaline Phosphatase: 94 U/L (ref 39–117)
BUN: 13 mg/dL (ref 6–23)
CO2: 24 mEq/L (ref 19–32)
Calcium: 8.2 mg/dL — ABNORMAL LOW (ref 8.4–10.5)
Chloride: 103 mEq/L (ref 96–112)
Creatinine, Ser: 1.14 mg/dL (ref 0.4–1.2)
GFR calc Af Amer: 55 mL/min — ABNORMAL LOW (ref >60)
GFR calc non Af Amer: 46 mL/min — ABNORMAL LOW (ref >60)
Glucose, Bld: 119 mg/dL — ABNORMAL HIGH (ref 70–99)
Potassium: 3.5 mEq/L (ref 3.5–5.1)
Sodium: 138 mEq/L (ref 135–145)
Total Bilirubin: 5.5 mg/dL — ABNORMAL HIGH (ref 0.3–1.2)
Total Protein: 5.9 g/dL — ABNORMAL LOW (ref 6.0–8.3)

## 2010-08-24 LAB — DIGOXIN LEVEL: Digoxin Level: 0.3 ng/mL — ABNORMAL LOW (ref 0.8–2.0)

## 2010-08-24 LAB — CARBOXYHEMOGLOBIN
Carboxyhemoglobin: 1.3 % (ref 0.5–1.5)
Methemoglobin: 1.2 % (ref 0.0–1.5)
O2 Saturation: 69.7 %
Total hemoglobin: 12.2 g/dL — ABNORMAL LOW (ref 12.5–16.0)
Total oxygen content: 11.6 mL/dL — ABNORMAL LOW (ref 15.0–23.0)

## 2010-08-24 LAB — URINE MICROSCOPIC-ADD ON

## 2010-08-24 LAB — LACTIC ACID, PLASMA: Lactic Acid, Venous: 4.6 mmol/L — ABNORMAL HIGH (ref 0.5–2.2)

## 2010-08-24 LAB — BLOOD GAS, ARTERIAL
Acid-base deficit: 4.2 mmol/L — ABNORMAL HIGH (ref 0.0–2.0)
Bicarbonate: 19.7 mEq/L — ABNORMAL LOW (ref 20.0–24.0)
O2 Content: 3 L/min
O2 Saturation: 94.6 %
Patient temperature: 37
TCO2: 17.8 mmol/L (ref 0–100)
pCO2 arterial: 32 mmHg — ABNORMAL LOW (ref 35.0–45.0)
pH, Arterial: 7.406 — ABNORMAL HIGH (ref 7.350–7.400)
pO2, Arterial: 72.3 mmHg — ABNORMAL LOW (ref 80.0–100.0)

## 2010-08-24 LAB — APTT: aPTT: 43 seconds — ABNORMAL HIGH (ref 24–37)

## 2010-08-24 LAB — PROTIME-INR
INR: 2.13 — ABNORMAL HIGH (ref 0.00–1.49)
Prothrombin Time: 24 seconds — ABNORMAL HIGH (ref 11.6–15.2)

## 2010-08-24 LAB — LIPASE, BLOOD: Lipase: 25 U/L (ref 11–59)

## 2010-08-24 LAB — MRSA PCR SCREENING: MRSA by PCR: NEGATIVE

## 2010-08-24 LAB — TYPE AND SCREEN
ABO/RH(D): AB POS
Antibody Screen: NEGATIVE

## 2010-08-24 LAB — PROCALCITONIN: Procalcitonin: 10.37 ng/mL

## 2010-08-24 LAB — D-DIMER, QUANTITATIVE (NOT AT ARMC): D-Dimer, Quant: 1.11 ug/mL-FEU — ABNORMAL HIGH (ref 0.00–0.48)

## 2010-08-24 MED ORDER — IOHEXOL 300 MG/ML  SOLN
100.0000 mL | Freq: Once | INTRAMUSCULAR | Status: AC | PRN
  Administered 2010-08-24: 100 mL via INTRAVENOUS

## 2010-08-25 ENCOUNTER — Inpatient Hospital Stay (HOSPITAL_COMMUNITY): Payer: Medicare Other

## 2010-08-25 DIAGNOSIS — K839 Disease of biliary tract, unspecified: Secondary | ICD-10-CM

## 2010-08-25 LAB — BLOOD GAS, ARTERIAL
Acid-base deficit: 2 mmol/L (ref 0.0–2.0)
Acid-base deficit: 0.4 mmol/L (ref 0.0–2.0)
Bicarbonate: 23 mEq/L (ref 20.0–24.0)
Bicarbonate: 24.2 mEq/L — ABNORMAL HIGH (ref 20.0–24.0)
FIO2: 1 %
FIO2: 0.6 %
MECHVT: 500 mL
MECHVT: 500 mL
O2 Saturation: 99.1 %
O2 Saturation: 96.3 %
PEEP: 5 cmH2O
PEEP: 5 cmH2O
Patient temperature: 37
Patient temperature: 37
RATE: 12 resp/min
RATE: 12 resp/min
TCO2: 21.2 mmol/L (ref 0–100)
TCO2: 22.2 mmol/L (ref 0–100)
pCO2 arterial: 44.1 mmHg (ref 35.0–45.0)
pCO2 arterial: 43.2 mmHg (ref 35.0–45.0)
pH, Arterial: 7.338 — ABNORMAL LOW (ref 7.350–7.400)
pH, Arterial: 7.367 (ref 7.350–7.400)
pO2, Arterial: 166 mmHg — ABNORMAL HIGH (ref 80.0–100.0)
pO2, Arterial: 78.4 mmHg — ABNORMAL LOW (ref 80.0–100.0)

## 2010-08-25 LAB — COMPREHENSIVE METABOLIC PANEL
ALT: 143 U/L — ABNORMAL HIGH (ref 0–35)
AST: 122 U/L — ABNORMAL HIGH (ref 0–37)
Albumin: 2.6 g/dL — ABNORMAL LOW (ref 3.5–5.2)
Alkaline Phosphatase: 76 U/L (ref 39–117)
BUN: 11 mg/dL (ref 6–23)
CO2: 22 mEq/L (ref 19–32)
Calcium: 7.1 mg/dL — ABNORMAL LOW (ref 8.4–10.5)
Chloride: 110 mEq/L (ref 96–112)
Creatinine, Ser: 0.9 mg/dL (ref 0.4–1.2)
GFR calc Af Amer: >60 mL/min (ref >60)
GFR calc non Af Amer: 60 mL/min — ABNORMAL LOW (ref >60)
Glucose, Bld: 98 mg/dL (ref 70–99)
Potassium: 3.9 mEq/L (ref 3.5–5.1)
Sodium: 139 mEq/L (ref 135–145)
Total Bilirubin: 7 mg/dL — ABNORMAL HIGH (ref 0.3–1.2)
Total Protein: 5.5 g/dL — ABNORMAL LOW (ref 6.0–8.3)

## 2010-08-25 LAB — CBC
HCT: 35.4 % — ABNORMAL LOW (ref 36.0–46.0)
Hemoglobin: 11.6 g/dL — ABNORMAL LOW (ref 12.0–15.0)
MCH: 33.8 pg (ref 26.0–34.0)
MCHC: 32.8 g/dL (ref 30.0–36.0)
MCV: 103.2 fL — ABNORMAL HIGH (ref 78.0–100.0)
Platelets: 110 10*3/uL — ABNORMAL LOW (ref 150–400)
RBC: 3.43 MIL/uL — ABNORMAL LOW (ref 3.87–5.11)
RDW: 15.2 % (ref 11.5–15.5)
WBC: 15.2 10*3/uL — ABNORMAL HIGH (ref 4.0–10.5)

## 2010-08-25 LAB — DIFFERENTIAL
Basophils Absolute: 0 10*3/uL (ref 0.0–0.1)
Basophils Relative: 0 % (ref 0–1)
Eosinophils Absolute: 0.1 10*3/uL (ref 0.0–0.7)
Eosinophils Relative: 0 % (ref 0–5)
Lymphocytes Relative: 6 % — ABNORMAL LOW (ref 12–46)
Lymphs Abs: 0.9 10*3/uL (ref 0.7–4.0)
Monocytes Absolute: 1.1 10*3/uL — ABNORMAL HIGH (ref 0.1–1.0)
Monocytes Relative: 7 % (ref 3–12)
Neutro Abs: 13.1 10*3/uL — ABNORMAL HIGH (ref 1.7–7.7)
Neutrophils Relative %: 87 % — ABNORMAL HIGH (ref 43–77)

## 2010-08-25 LAB — PROTIME-INR
INR: 2.11 — ABNORMAL HIGH (ref 0.00–1.49)
INR: 1.72 — ABNORMAL HIGH (ref 0.00–1.49)
Prothrombin Time: 23.8 seconds — ABNORMAL HIGH (ref 11.6–15.2)
Prothrombin Time: 20.3 seconds — ABNORMAL HIGH (ref 11.6–15.2)

## 2010-08-25 LAB — URINE CULTURE
Colony Count: NO GROWTH
Culture  Setup Time: 201204200116
Culture: NO GROWTH
Special Requests: NEGATIVE

## 2010-08-25 LAB — AMYLASE: Amylase: 15 U/L (ref 0–105)

## 2010-08-25 LAB — LACTIC ACID, PLASMA: Lactic Acid, Venous: 1.1 mmol/L (ref 0.5–2.2)

## 2010-08-26 ENCOUNTER — Inpatient Hospital Stay (HOSPITAL_COMMUNITY): Payer: Medicare Other

## 2010-08-26 LAB — PREPARE FRESH FROZEN PLASMA
Unit division: 0
Unit division: 0
Unit division: 0

## 2010-08-26 LAB — COMPREHENSIVE METABOLIC PANEL
ALT: 95 U/L — ABNORMAL HIGH (ref 0–35)
AST: 68 U/L — ABNORMAL HIGH (ref 0–37)
Albumin: 2.6 g/dL — ABNORMAL LOW (ref 3.5–5.2)
Alkaline Phosphatase: 71 U/L (ref 39–117)
BUN: 10 mg/dL (ref 6–23)
CO2: 26 mEq/L (ref 19–32)
Calcium: 7.5 mg/dL — ABNORMAL LOW (ref 8.4–10.5)
Chloride: 109 mEq/L (ref 96–112)
Creatinine, Ser: 0.79 mg/dL (ref 0.4–1.2)
GFR calc Af Amer: >60 mL/min (ref >60)
GFR calc non Af Amer: >60 mL/min (ref >60)
Glucose, Bld: 98 mg/dL (ref 70–99)
Potassium: 3.5 mEq/L (ref 3.5–5.1)
Sodium: 139 mEq/L (ref 135–145)
Total Bilirubin: 4.6 mg/dL — ABNORMAL HIGH (ref 0.3–1.2)
Total Protein: 5.4 g/dL — ABNORMAL LOW (ref 6.0–8.3)

## 2010-08-26 LAB — BLOOD GAS, ARTERIAL
Acid-base deficit: 0.9 mmol/L (ref 0.0–2.0)
Acid-base deficit: 0.4 mmol/L (ref 0.0–2.0)
Bicarbonate: 23.3 mEq/L (ref 20.0–24.0)
Bicarbonate: 23.7 mEq/L (ref 20.0–24.0)
Drawn by: 23534
FIO2: 0.6 %
FIO2: 0.5 %
MECHVT: 500 mL
MECHVT: 500 mL
O2 Content: 50 L/min
O2 Saturation: 98.2 %
O2 Saturation: 98.5 %
PEEP: 5 cmH2O
PEEP: 5 cmH2O
Patient temperature: 37
Patient temperature: 37
RATE: 12 resp/min
RATE: 12 resp/min
TCO2: 21.2 mmol/L (ref 0–100)
TCO2: 21.2 mmol/L (ref 0–100)
pCO2 arterial: 39.2 mmHg (ref 35.0–45.0)
pCO2 arterial: 38.8 mmHg (ref 35.0–45.0)
pH, Arterial: 7.392 (ref 7.350–7.400)
pH, Arterial: 7.403 — ABNORMAL HIGH (ref 7.350–7.400)
pO2, Arterial: 103 mmHg — ABNORMAL HIGH (ref 80.0–100.0)
pO2, Arterial: 112 mmHg — ABNORMAL HIGH (ref 80.0–100.0)

## 2010-08-26 LAB — CBC
HCT: 34.3 % — ABNORMAL LOW (ref 36.0–46.0)
Hemoglobin: 11.3 g/dL — ABNORMAL LOW (ref 12.0–15.0)
MCH: 34.5 pg — ABNORMAL HIGH (ref 26.0–34.0)
MCHC: 32.9 g/dL (ref 30.0–36.0)
MCV: 104.6 fL — ABNORMAL HIGH (ref 78.0–100.0)
Platelets: 98 10*3/uL — ABNORMAL LOW (ref 150–400)
RBC: 3.28 MIL/uL — ABNORMAL LOW (ref 3.87–5.11)
RDW: 15.4 % (ref 11.5–15.5)
WBC: 8.3 10*3/uL (ref 4.0–10.5)

## 2010-08-26 LAB — DIFFERENTIAL
Basophils Absolute: 0 10*3/uL (ref 0.0–0.1)
Basophils Relative: 0 % (ref 0–1)
Eosinophils Absolute: 0.1 10*3/uL (ref 0.0–0.7)
Eosinophils Relative: 1 % (ref 0–5)
Lymphocytes Relative: 9 % — ABNORMAL LOW (ref 12–46)
Lymphs Abs: 0.7 10*3/uL (ref 0.7–4.0)
Monocytes Absolute: 0.4 10*3/uL (ref 0.1–1.0)
Monocytes Relative: 5 % (ref 3–12)
Neutro Abs: 7.1 10*3/uL (ref 1.7–7.7)
Neutrophils Relative %: 85 % — ABNORMAL HIGH (ref 43–77)

## 2010-08-26 LAB — AMYLASE: Amylase: 13 U/L (ref 0–105)

## 2010-08-26 LAB — PROTIME-INR
INR: 1.45 (ref 0.00–1.49)
Prothrombin Time: 17.8 seconds — ABNORMAL HIGH (ref 11.6–15.2)

## 2010-08-27 ENCOUNTER — Inpatient Hospital Stay (HOSPITAL_COMMUNITY): Payer: Medicare Other

## 2010-08-27 LAB — BASIC METABOLIC PANEL
BUN: 8 mg/dL (ref 6–23)
CO2: 25 mEq/L (ref 19–32)
Calcium: 7.4 mg/dL — ABNORMAL LOW (ref 8.4–10.5)
Chloride: 108 mEq/L (ref 96–112)
Creatinine, Ser: 0.76 mg/dL (ref 0.4–1.2)
GFR calc Af Amer: >60 mL/min (ref >60)
GFR calc non Af Amer: >60 mL/min (ref >60)
Glucose, Bld: 102 mg/dL — ABNORMAL HIGH (ref 70–99)
Potassium: 3.2 mEq/L — ABNORMAL LOW (ref 3.5–5.1)
Sodium: 141 mEq/L (ref 135–145)

## 2010-08-27 LAB — CBC
HCT: 34.7 % — ABNORMAL LOW (ref 36.0–46.0)
Hemoglobin: 11.4 g/dL — ABNORMAL LOW (ref 12.0–15.0)
MCH: 34.2 pg — ABNORMAL HIGH (ref 26.0–34.0)
MCHC: 32.9 g/dL (ref 30.0–36.0)
MCV: 104.2 fL — ABNORMAL HIGH (ref 78.0–100.0)
Platelets: 101 10*3/uL — ABNORMAL LOW (ref 150–400)
RBC: 3.33 MIL/uL — ABNORMAL LOW (ref 3.87–5.11)
RDW: 15.3 % (ref 11.5–15.5)
WBC: 5.1 10*3/uL (ref 4.0–10.5)

## 2010-08-27 LAB — BLOOD GAS, ARTERIAL
Acid-Base Excess: 0.2 mmol/L (ref 0.0–2.0)
Bicarbonate: 24 mEq/L (ref 20.0–24.0)
FIO2: 0.35 %
MECHVT: 500 mL
O2 Saturation: 96.6 %
PEEP: 5 cmH2O
Patient temperature: 37
RATE: 12 resp/min
TCO2: 21.5 mmol/L (ref 0–100)
pCO2 arterial: 36.4 mmHg (ref 35.0–45.0)
pH, Arterial: 7.434 — ABNORMAL HIGH (ref 7.350–7.400)
pO2, Arterial: 82.4 mmHg (ref 80.0–100.0)

## 2010-08-27 LAB — HEPATIC FUNCTION PANEL
ALT: 70 U/L — ABNORMAL HIGH (ref 0–35)
AST: 45 U/L — ABNORMAL HIGH (ref 0–37)
Albumin: 2.3 g/dL — ABNORMAL LOW (ref 3.5–5.2)
Alkaline Phosphatase: 72 U/L (ref 39–117)
Bilirubin, Direct: 2.3 mg/dL — ABNORMAL HIGH (ref 0.0–0.3)
Indirect Bilirubin: 1.2 mg/dL — ABNORMAL HIGH (ref 0.3–0.9)
Total Bilirubin: 3.5 mg/dL — ABNORMAL HIGH (ref 0.3–1.2)
Total Protein: 5.1 g/dL — ABNORMAL LOW (ref 6.0–8.3)

## 2010-08-27 LAB — DIFFERENTIAL
Basophils Absolute: 0 10*3/uL (ref 0.0–0.1)
Basophils Relative: 1 % (ref 0–1)
Eosinophils Absolute: 0.2 10*3/uL (ref 0.0–0.7)
Eosinophils Relative: 3 % (ref 0–5)
Lymphocytes Relative: 15 % (ref 12–46)
Lymphs Abs: 0.8 10*3/uL (ref 0.7–4.0)
Monocytes Absolute: 0.5 10*3/uL (ref 0.1–1.0)
Monocytes Relative: 9 % (ref 3–12)
Neutro Abs: 3.7 10*3/uL (ref 1.7–7.7)
Neutrophils Relative %: 72 % (ref 43–77)

## 2010-08-27 LAB — PROTIME-INR
INR: 1.21 (ref 0.00–1.49)
Prothrombin Time: 15.5 seconds — ABNORMAL HIGH (ref 11.6–15.2)

## 2010-08-27 LAB — POTASSIUM: Potassium: 3.8 mEq/L (ref 3.5–5.1)

## 2010-08-28 ENCOUNTER — Inpatient Hospital Stay (HOSPITAL_COMMUNITY): Payer: Medicare Other

## 2010-08-28 ENCOUNTER — Other Ambulatory Visit: Payer: Self-pay | Admitting: General Surgery

## 2010-08-28 LAB — BLOOD GAS, ARTERIAL
Acid-Base Excess: 0.7 mmol/L (ref 0.0–2.0)
Bicarbonate: 24.6 mEq/L — ABNORMAL HIGH (ref 20.0–24.0)
FIO2: 0.35 %
MECHVT: 500 mL
O2 Saturation: 96.9 %
PEEP: 5 cmH2O
Patient temperature: 37
RATE: 12 resp/min
TCO2: 22.2 mmol/L (ref 0–100)
pCO2 arterial: 38.5 mmHg (ref 35.0–45.0)
pH, Arterial: 7.422 — ABNORMAL HIGH (ref 7.350–7.400)
pO2, Arterial: 85.3 mmHg (ref 80.0–100.0)

## 2010-08-28 LAB — COMPREHENSIVE METABOLIC PANEL
ALT: 52 U/L — ABNORMAL HIGH (ref 0–35)
AST: 39 U/L — ABNORMAL HIGH (ref 0–37)
Albumin: 2.1 g/dL — ABNORMAL LOW (ref 3.5–5.2)
Alkaline Phosphatase: 70 U/L (ref 39–117)
BUN: 6 mg/dL (ref 6–23)
CO2: 26 mEq/L (ref 19–32)
Calcium: 7.5 mg/dL — ABNORMAL LOW (ref 8.4–10.5)
Chloride: 106 mEq/L (ref 96–112)
Creatinine, Ser: 0.65 mg/dL (ref 0.4–1.2)
GFR calc Af Amer: >60 mL/min (ref >60)
GFR calc non Af Amer: >60 mL/min (ref >60)
Glucose, Bld: 124 mg/dL — ABNORMAL HIGH (ref 70–99)
Potassium: 3.7 mEq/L (ref 3.5–5.1)
Sodium: 139 mEq/L (ref 135–145)
Total Bilirubin: 1.9 mg/dL — ABNORMAL HIGH (ref 0.3–1.2)
Total Protein: 4.9 g/dL — ABNORMAL LOW (ref 6.0–8.3)

## 2010-08-28 LAB — CBC
HCT: 35.9 % — ABNORMAL LOW (ref 36.0–46.0)
Hemoglobin: 11.7 g/dL — ABNORMAL LOW (ref 12.0–15.0)
MCH: 33.8 pg (ref 26.0–34.0)
MCHC: 32.6 g/dL (ref 30.0–36.0)
MCV: 103.8 fL — ABNORMAL HIGH (ref 78.0–100.0)
Platelets: 106 10*3/uL — ABNORMAL LOW (ref 150–400)
RBC: 3.46 MIL/uL — ABNORMAL LOW (ref 3.87–5.11)
RDW: 15.1 % (ref 11.5–15.5)
WBC: 5 10*3/uL (ref 4.0–10.5)

## 2010-08-28 LAB — DIFFERENTIAL
Basophils Absolute: 0.1 10*3/uL (ref 0.0–0.1)
Basophils Relative: 1 % (ref 0–1)
Eosinophils Absolute: 0.2 10*3/uL (ref 0.0–0.7)
Eosinophils Relative: 3 % (ref 0–5)
Lymphocytes Relative: 19 % (ref 12–46)
Lymphs Abs: 0.9 10*3/uL (ref 0.7–4.0)
Monocytes Absolute: 0.5 10*3/uL (ref 0.1–1.0)
Monocytes Relative: 11 % (ref 3–12)
Neutro Abs: 3.3 10*3/uL (ref 1.7–7.7)
Neutrophils Relative %: 66 % (ref 43–77)

## 2010-08-28 LAB — H. PYLORI ANTIBODY, IGG: H Pylori IgG: 0.83 {ISR}

## 2010-08-29 ENCOUNTER — Inpatient Hospital Stay (HOSPITAL_COMMUNITY): Payer: Medicare Other

## 2010-08-29 LAB — BLOOD GAS, ARTERIAL
Acid-Base Excess: 0.1 mmol/L (ref 0.0–2.0)
Bicarbonate: 23.4 mEq/L (ref 20.0–24.0)
FIO2: 35 %
MECHVT: 500 mL
O2 Content: 35 L/min
O2 Saturation: 97.6 %
PEEP: 5 cmH2O
Patient temperature: 37
RATE: 12 resp/min
TCO2: 20.4 mmol/L (ref 0–100)
pCO2 arterial: 32.9 mmHg — ABNORMAL LOW (ref 35.0–45.0)
pH, Arterial: 7.465 — ABNORMAL HIGH (ref 7.350–7.400)
pO2, Arterial: 94.4 mmHg (ref 80.0–100.0)

## 2010-08-29 LAB — HEPATIC FUNCTION PANEL
ALT: 53 U/L — ABNORMAL HIGH (ref 0–35)
AST: 63 U/L — ABNORMAL HIGH (ref 0–37)
Albumin: 2.2 g/dL — ABNORMAL LOW (ref 3.5–5.2)
Alkaline Phosphatase: 71 U/L (ref 39–117)
Bilirubin, Direct: 1 mg/dL — ABNORMAL HIGH (ref 0.0–0.3)
Indirect Bilirubin: 1 mg/dL — ABNORMAL HIGH (ref 0.3–0.9)
Total Bilirubin: 2 mg/dL — ABNORMAL HIGH (ref 0.3–1.2)
Total Protein: 5 g/dL — ABNORMAL LOW (ref 6.0–8.3)

## 2010-08-29 LAB — CULTURE, BLOOD (ROUTINE X 2)
Culture: NO GROWTH
Culture: NO GROWTH

## 2010-08-29 LAB — PHOSPHORUS: Phosphorus: 2.3 mg/dL (ref 2.3–4.6)

## 2010-08-29 LAB — DIFFERENTIAL
Basophils Absolute: 0.1 10*3/uL (ref 0.0–0.1)
Basophils Relative: 1 % (ref 0–1)
Eosinophils Absolute: 0 10*3/uL (ref 0.0–0.7)
Eosinophils Relative: 0 % (ref 0–5)
Lymphocytes Relative: 12 % (ref 12–46)
Lymphs Abs: 1 10*3/uL (ref 0.7–4.0)
Monocytes Absolute: 0.6 10*3/uL (ref 0.1–1.0)
Monocytes Relative: 7 % (ref 3–12)
Neutro Abs: 7.1 10*3/uL (ref 1.7–7.7)
Neutrophils Relative %: 81 % — ABNORMAL HIGH (ref 43–77)

## 2010-08-29 LAB — CBC
HCT: 37.5 % (ref 36.0–46.0)
Hemoglobin: 12.8 g/dL (ref 12.0–15.0)
MCH: 34.9 pg — ABNORMAL HIGH (ref 26.0–34.0)
MCHC: 34.1 g/dL (ref 30.0–36.0)
MCV: 102.2 fL — ABNORMAL HIGH (ref 78.0–100.0)
Platelets: 122 10*3/uL — ABNORMAL LOW (ref 150–400)
RBC: 3.67 MIL/uL — ABNORMAL LOW (ref 3.87–5.11)
RDW: 14.7 % (ref 11.5–15.5)
WBC: 8.9 10*3/uL (ref 4.0–10.5)

## 2010-08-29 LAB — BASIC METABOLIC PANEL
BUN: 6 mg/dL (ref 6–23)
CO2: 25 mEq/L (ref 19–32)
Calcium: 7.7 mg/dL — ABNORMAL LOW (ref 8.4–10.5)
Chloride: 106 mEq/L (ref 96–112)
Creatinine, Ser: 0.53 mg/dL (ref 0.4–1.2)
GFR calc Af Amer: >60 mL/min (ref >60)
GFR calc non Af Amer: >60 mL/min (ref >60)
Glucose, Bld: 144 mg/dL — ABNORMAL HIGH (ref 70–99)
Potassium: 4.3 mEq/L (ref 3.5–5.1)
Sodium: 138 mEq/L (ref 135–145)

## 2010-08-29 LAB — MAGNESIUM: Magnesium: 1.8 mg/dL (ref 1.5–2.5)

## 2010-08-30 ENCOUNTER — Inpatient Hospital Stay (HOSPITAL_COMMUNITY): Payer: Medicare Other

## 2010-08-30 LAB — DIFFERENTIAL
Basophils Absolute: 0.1 10*3/uL (ref 0.0–0.1)
Basophils Relative: 1 % (ref 0–1)
Eosinophils Absolute: 0.1 10*3/uL (ref 0.0–0.7)
Eosinophils Relative: 1 % (ref 0–5)
Lymphocytes Relative: 16 % (ref 12–46)
Lymphs Abs: 1.6 10*3/uL (ref 0.7–4.0)
Monocytes Absolute: 0.7 10*3/uL (ref 0.1–1.0)
Monocytes Relative: 8 % (ref 3–12)
Neutro Abs: 7.2 10*3/uL (ref 1.7–7.7)
Neutrophils Relative %: 74 % (ref 43–77)

## 2010-08-30 LAB — BLOOD GAS, ARTERIAL
Acid-Base Excess: 4.1 mmol/L — ABNORMAL HIGH (ref 0.0–2.0)
Bicarbonate: 27.4 mEq/L — ABNORMAL HIGH (ref 20.0–24.0)
Drawn by: 222231
FIO2: 35 %
MECHVT: 500 mL
O2 Saturation: 95.8 %
PEEP: 5 cmH2O
Patient temperature: 37
RATE: 12 resp/min
TCO2: 23.8 mmol/L (ref 0–100)
pCO2 arterial: 36.4 mmHg (ref 35.0–45.0)
pH, Arterial: 7.49 — ABNORMAL HIGH (ref 7.350–7.400)
pO2, Arterial: 71.6 mmHg — ABNORMAL LOW (ref 80.0–100.0)

## 2010-08-30 LAB — COMPREHENSIVE METABOLIC PANEL
ALT: 57 U/L — ABNORMAL HIGH (ref 0–35)
AST: 77 U/L — ABNORMAL HIGH (ref 0–37)
Albumin: 2.2 g/dL — ABNORMAL LOW (ref 3.5–5.2)
Alkaline Phosphatase: 70 U/L (ref 39–117)
BUN: 8 mg/dL (ref 6–23)
CO2: 29 mEq/L (ref 19–32)
Calcium: 7.7 mg/dL — ABNORMAL LOW (ref 8.4–10.5)
Chloride: 102 mEq/L (ref 96–112)
Creatinine, Ser: 0.65 mg/dL (ref 0.4–1.2)
GFR calc Af Amer: >60 mL/min (ref >60)
GFR calc non Af Amer: >60 mL/min (ref >60)
Glucose, Bld: 140 mg/dL — ABNORMAL HIGH (ref 70–99)
Potassium: 3.5 mEq/L (ref 3.5–5.1)
Sodium: 137 mEq/L (ref 135–145)
Total Bilirubin: 1.8 mg/dL — ABNORMAL HIGH (ref 0.3–1.2)
Total Protein: 5.1 g/dL — ABNORMAL LOW (ref 6.0–8.3)

## 2010-08-30 LAB — CBC
HCT: 38.4 % (ref 36.0–46.0)
Hemoglobin: 12.7 g/dL (ref 12.0–15.0)
MCH: 34 pg (ref 26.0–34.0)
MCHC: 33.1 g/dL (ref 30.0–36.0)
MCV: 102.9 fL — ABNORMAL HIGH (ref 78.0–100.0)
Platelets: 117 10*3/uL — ABNORMAL LOW (ref 150–400)
RBC: 3.73 MIL/uL — ABNORMAL LOW (ref 3.87–5.11)
RDW: 14.9 % (ref 11.5–15.5)
WBC: 9.7 10*3/uL (ref 4.0–10.5)

## 2010-08-30 NOTE — Op Note (Signed)
[name], [name]           ACCOUNT NO.:  [id number]  MEDICAL RECORD NO.:  [id number]          PATIENT TYPE:  INP  LOCATION:  IC06                          FACILITY:  [facility]  PHYSICIAN:  Dalia Heading, M.D.  DATE OF BIRTH:  1926-02-04  DATE OF PROCEDURE:  08/28/2010 DATE OF DISCHARGE:                              OPERATIVE REPORT   PREOPERATIVE DIAGNOSES:  Cholecystitis and cholelithiasis.  POSTOPERATIVE DIAGNOSES:  Cholecystitis, cholelithiasis, and cirrhosis of liver.  PROCEDURE:  Laparoscopic cholecystectomy, liver biopsy.  SURGEON:  Dalia Heading, MD  ANESTHESIA:  General endotracheal.  INDICATIONS:  The patient is an 77 year old white female, status post an ERCP for septic cholangitis.  She does have known cholelithiasis and now presents for laparoscopic cholecystectomy.  She had some difficulties after her ERCP and had to be reintubated.  She was coming to the intensive care unit directly into the operating room intubated.  She had signed her consent for surgery prior to her ERCP.  PROCEDURE NOTE:  The patient was placed in the supine position.  An attempt by Anesthesia was made to change the tube from #6 to #7 endotracheal tube, but this was unsuccessful.  Thus, a #6 endotracheal tube was in place.  The abdomen was prepped and draped in the usual sterile technique with DuraPrep.  Surgical site confirmation was performed.  A supraumbilical incision was made down to the fascia.  A Veress needle was introduced into the abdominal cavity and confirmation of placement was done using the saline drop test.  The abdomen was then insufflated to 16 mmHg pressure.  11 mm trocar was introduced into the abdominal cavity under direct visualization without difficulty.  The patient was placed in reverse Trendelenburg position.  Additional 11-mm trocar was placed in the epigastric region, 5-mm trocars were placed in the right upper quadrant, right flank regions.  The liver  was inspected and noted to have cirrhotic changes.  A Tru-Cut needle biopsy of the right lobe of liver was taken and sent to pathology for further examination.  The biopsy site was cauterized using Bovie electrocautery.  There was ascites noted in the upper abdomen.  The gallbladder was retracted superiorly and laterally.  The dissection was begun around the infundibulum of the gallbladder.  The cystic duct was first identified. Its juncture to the infundibulum fully identified.  Endo Clips were placed proximally distally on the cystic duct and cystic duct was divided.  This likewise done cystic artery.  The gallbladder then freed away from the gallbladder fossa using Bovie electrocautery.  The gallbladder was delivered through the epigastric trocar site using EndoCatch bag.  The gallbladder fossa was inspected and no abnormal bleeding or bile leakage was noted.  Surgicel was placed in the gallbladder fossa.  All fluid and air then evacuated from the abdominal cavity prior to removal of the trocars.  All wounds were irrigated with normal saline.  All wounds were injected with 0.5 % Sensorcaine.  The supraumbilical fascia as well as epigastric fascia reapproximated using 0 Vicryl interrupted sutures.  All skin incisions were closed using staples.  Betadine ointment, dry sterile dressings were applied.  All tape and  needle counts were correct at the end of the procedure. The patient was transferred back to the Intensive Care Unit intubated in guarded but stable condition.  COMPLICATIONS:  None.  SPECIMEN: 1. Gallbladder. 2. Liver biopsy.  BLOOD LOSS:  Minimal.     Dalia Heading, M.D.     MAJ/MEDQ  D:  08/28/2010  T:  08/29/2010  Job:  [id number]  cc:   Lionel December, M.D. Fax: [phone]  Venora Maples Fax: [phone]  Electronically Signed by Franky Macho M.D. on 08/30/2010 09:18:37 AM

## 2010-08-31 ENCOUNTER — Inpatient Hospital Stay (HOSPITAL_COMMUNITY): Payer: Medicare Other

## 2010-08-31 LAB — BLOOD GAS, ARTERIAL
Acid-Base Excess: 5.6 mmol/L — ABNORMAL HIGH (ref 0.0–2.0)
Acid-Base Excess: 5.2 mmol/L — ABNORMAL HIGH (ref 0.0–2.0)
Bicarbonate: 29.1 mEq/L — ABNORMAL HIGH (ref 20.0–24.0)
Bicarbonate: 29.1 mEq/L — ABNORMAL HIGH (ref 20.0–24.0)
FIO2: 0.35 %
MECHVT: 500 mL
Mode: POSITIVE
O2 Content: 35 L/min
O2 Saturation: 96.6 %
O2 Saturation: 97.5 %
PEEP: 5 cmH2O
PEEP: 5 cmH2O
Patient temperature: 37
Patient temperature: 37
Pressure support: 10 cmH2O
RATE: 12 resp/min
TCO2: 25.6 mmol/L (ref 0–100)
TCO2: 25.7 mmol/L (ref 0–100)
pCO2 arterial: 38.4 mmHg (ref 35.0–45.0)
pCO2 arterial: 42 mmHg (ref 35.0–45.0)
pH, Arterial: 7.491 — ABNORMAL HIGH (ref 7.350–7.400)
pH, Arterial: 7.455 — ABNORMAL HIGH (ref 7.350–7.400)
pO2, Arterial: 82.9 mmHg (ref 80.0–100.0)
pO2, Arterial: 94.2 mmHg (ref 80.0–100.0)

## 2010-08-31 LAB — DIFFERENTIAL
Basophils Absolute: 0 10*3/uL (ref 0.0–0.1)
Basophils Relative: 0 % (ref 0–1)
Eosinophils Absolute: 0.1 10*3/uL (ref 0.0–0.7)
Eosinophils Relative: 1 % (ref 0–5)
Lymphocytes Relative: 15 % (ref 12–46)
Lymphs Abs: 1.4 10*3/uL (ref 0.7–4.0)
Monocytes Absolute: 0.9 10*3/uL (ref 0.1–1.0)
Monocytes Relative: 10 % (ref 3–12)
Neutro Abs: 6.9 10*3/uL (ref 1.7–7.7)
Neutrophils Relative %: 74 % (ref 43–77)

## 2010-08-31 LAB — CBC
HCT: 36.6 % (ref 36.0–46.0)
Hemoglobin: 12 g/dL (ref 12.0–15.0)
MCH: 33.8 pg (ref 26.0–34.0)
MCHC: 32.8 g/dL (ref 30.0–36.0)
MCV: 103.1 fL — ABNORMAL HIGH (ref 78.0–100.0)
Platelets: 129 10*3/uL — ABNORMAL LOW (ref 150–400)
RBC: 3.55 MIL/uL — ABNORMAL LOW (ref 3.87–5.11)
RDW: 14.8 % (ref 11.5–15.5)
WBC: 9.3 10*3/uL (ref 4.0–10.5)

## 2010-08-31 LAB — BASIC METABOLIC PANEL
BUN: 10 mg/dL (ref 6–23)
CO2: 31 mEq/L (ref 19–32)
Calcium: 7.7 mg/dL — ABNORMAL LOW (ref 8.4–10.5)
Chloride: 100 mEq/L (ref 96–112)
Creatinine, Ser: 0.72 mg/dL (ref 0.4–1.2)
GFR calc Af Amer: >60 mL/min (ref >60)
GFR calc non Af Amer: >60 mL/min (ref >60)
Glucose, Bld: 110 mg/dL — ABNORMAL HIGH (ref 70–99)
Potassium: 3.5 mEq/L (ref 3.5–5.1)
Sodium: 139 mEq/L (ref 135–145)

## 2010-09-01 ENCOUNTER — Inpatient Hospital Stay (HOSPITAL_COMMUNITY): Payer: Medicare Other

## 2010-09-01 LAB — HEPATITIS B SURFACE ANTIGEN: Hepatitis B Surface Ag: NEGATIVE

## 2010-09-01 LAB — HEPATITIS C ANTIBODY: HCV Ab: NEGATIVE

## 2010-09-03 LAB — DIFFERENTIAL
Basophils Absolute: 0 10*3/uL (ref 0.0–0.1)
Basophils Relative: 1 % (ref 0–1)
Eosinophils Absolute: 0.1 10*3/uL (ref 0.0–0.7)
Eosinophils Relative: 2 % (ref 0–5)
Lymphocytes Relative: 18 % (ref 12–46)
Lymphs Abs: 1.4 10*3/uL (ref 0.7–4.0)
Monocytes Absolute: 0.7 10*3/uL (ref 0.1–1.0)
Monocytes Relative: 9 % (ref 3–12)
Neutro Abs: 5.7 10*3/uL (ref 1.7–7.7)
Neutrophils Relative %: 71 % (ref 43–77)

## 2010-09-03 LAB — CBC
HCT: 35.7 % — ABNORMAL LOW (ref 36.0–46.0)
Hemoglobin: 11.6 g/dL — ABNORMAL LOW (ref 12.0–15.0)
MCH: 33.3 pg (ref 26.0–34.0)
MCHC: 32.5 g/dL (ref 30.0–36.0)
MCV: 102.6 fL — ABNORMAL HIGH (ref 78.0–100.0)
Platelets: 138 10*3/uL — ABNORMAL LOW (ref 150–400)
RBC: 3.48 MIL/uL — ABNORMAL LOW (ref 3.87–5.11)
RDW: 14.5 % (ref 11.5–15.5)
WBC: 8 10*3/uL (ref 4.0–10.5)

## 2010-09-03 LAB — COMPREHENSIVE METABOLIC PANEL
ALT: 37 U/L — ABNORMAL HIGH (ref 0–35)
AST: 54 U/L — ABNORMAL HIGH (ref 0–37)
Albumin: 2.4 g/dL — ABNORMAL LOW (ref 3.5–5.2)
Alkaline Phosphatase: 60 U/L (ref 39–117)
BUN: 10 mg/dL (ref 6–23)
CO2: 28 mEq/L (ref 19–32)
Calcium: 7.8 mg/dL — ABNORMAL LOW (ref 8.4–10.5)
Chloride: 97 mEq/L (ref 96–112)
Creatinine, Ser: 0.59 mg/dL (ref 0.4–1.2)
GFR calc Af Amer: >60 mL/min (ref >60)
GFR calc non Af Amer: >60 mL/min (ref >60)
Glucose, Bld: 191 mg/dL — ABNORMAL HIGH (ref 70–99)
Potassium: 4.1 mEq/L (ref 3.5–5.1)
Sodium: 132 mEq/L — ABNORMAL LOW (ref 135–145)
Total Bilirubin: 2.4 mg/dL — ABNORMAL HIGH (ref 0.3–1.2)
Total Protein: 5.4 g/dL — ABNORMAL LOW (ref 6.0–8.3)

## 2010-09-03 LAB — PROTIME-INR
INR: 1.15 (ref 0.00–1.49)
Prothrombin Time: 14.9 seconds (ref 11.6–15.2)

## 2010-09-03 LAB — GLUCOSE, CAPILLARY: Glucose-Capillary: 101 mg/dL — ABNORMAL HIGH (ref 70–99)

## 2010-09-03 NOTE — H&P (Signed)
NAMEKELSEA, MOUSEL           ACCOUNT NO.:  [id number]  MEDICAL RECORD NO.:  [id number]          PATIENT TYPE:  INP  LOCATION:  IC06                          FACILITY:  [facility]  PHYSICIAN:  Sahir Tolson L. Juanetta Gosling, M.D.DATE OF BIRTH:  [birth year]  DATE OF ADMISSION: DATE OF DISCHARGE:  LH                             HISTORY & PHYSICAL   REASON FOR ADMISSION:  Abdominal pain.  HISTORY:  Ms. Wettstein is an 77 year old who came to the emergency room because of abdominal pain.  All of this started after she had her evening meal on August 23, 2010.  She said she vomited at least twice, did not have any diarrhea, the pain is fairly diffuse, more in the right upper quadrant than anywhere else.  She took some gas preparation but it did not help.  When she came to the emergency room, she was noted to be in some fairly moderate distress with abdominal pain.  PAST MEDICAL HISTORY:  Positive for: 1. Atrial fibrillation. 2. History of a DVT. 3. History of pulmonary embolus. 4. Hyperlipidemia. 5. She has had a pacemaker. 6. 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  PAST SURGICAL HISTORY:  Positive for the brain aneurysm clipping.  She has had cataract surgery which was bilateral.  SOCIAL HISTORY:  She is a widow.  She is nonsmoker, nondrinker, does not use any alcohol, does not use any illicit drugs.  FAMILY HISTORY:  Positive for coronary artery disease and hypertension.  REVIEW OF SYSTEMS:  After she received Dilaudid in the emergency room, she became very confused and poorly responsive, so she underwent a CT of the brain which showed no changes from previous brain CTs.  MEDICATIONS AT HOME:  Unfortunately, we do not have all of the doses and how she takes them are: 1. Diltiazem CD 240 daily. 2. Coumadin 5 mg daily. 3. Metoprolol tartrate 25 mg, I think, twice a day. 4. Aspirin 81 mg daily. 5. Digoxin 0.125 mg daily. 6.  Gabapentin 300 mg as needed 7. Allopurinol 300 mg daily.  She is allergic to PENICILLIN and CODEINE.  PHYSICAL EXAMINATION:  GENERAL:  A well-developed, well-nourished female who appears to be pretty uncomfortable.  She is still fairly sluggish. She says that she has seen Dr. Karilyn Cota in the past, so I will ask him to see her. HEENT:  Her pupils are reactive.  Nose and throat are clear.  Mucous membranes are moist. NECK:  Supple without masses. CHEST:  Rhonchi bilaterally. HEART:  She has irregular heart rate. ABDOMEN:  Mildly diffusely tender.  Bowel sounds present and active. EXTREMITIES:  No edema. CENTRAL NERVOUS SYSTEM:  She is less confused now but still pretty sleepy.  She did receive some Narcan which did not make much difference in the Dilaudid but she has come around from the original situation.  LABORATORY DATA:  Her white blood count is 8300, hemoglobin is 14.1, platelets 139.  Electrolytes are normal.  Her glucose 137, BUN 13, creatinine 0.83.  Her SGOT is 194, SGPT is 144, alkaline phos is normal, bilirubin is up  to 3.  She had a PT done that was 2.13.  Lipase was 25.  She had an acute abdominal series that showed no acute abdominal abnormalities. She had a CT of the abdomen that showed distended gallbladder with cholelithiasis, felt to be nonspecific but possibly cholecystitis. She underwent a CT brain as mentioned which showed no change.  ASSESSMENT:  She has abdominal pain, nausea, vomiting.  She has a cholelithiasis and a distended gallbladder, I am concerned that she may have acute cholecystitis.  She is allergic to PENICILLIN, so I put her on Cipro and Flagyl.  She is on Coumadin.  She is therapeutically anticoagulated, so if there appears her going to surgery, we will have to reverse that.  She is in atrial fibrillation.  I put her on sequential compression devices for deep venous thrombosis prophylaxis. She had been on Dilaudid for pain but she has had  problems with that, so I am going to put her on morphine.  She will have Zofran for nausea.  I have asked for gastrointestinal consultation.  She will probably need surgical consultation, but I would like to get the gastrointestinal team first because I wonder if she has got a stone that is impacted in the common bile duct.  Otherwise, everything seems to be doing better and I would continue with her current treatments and medications and follow.     Jaylyn Booher L. Juanetta Gosling, M.D.     ELH/MEDQ  D:  08/24/2010  T:  08/24/2010  Job:  [id number]  Electronically Signed by Kari Baars M.D. on 09/03/2010 02:46:01 PM

## 2010-09-03 NOTE — Group Therapy Note (Signed)
  NAMEYURIKA, [name]           ACCOUNT NO.:  [id number]  MEDICAL RECORD NO.:  [id number]           PATIENT TYPE:  I  LOCATION:  A213                          FACILITY:  [facility]  PHYSICIAN:  Jaylea Plourde L. Juanetta Gosling, M.D.DATE OF BIRTH:  04-10-1926  DATE OF PROCEDURE: DATE OF DISCHARGE:                                PROGRESS NOTE   [name] is doing fairly well.  She has been transferred from the Intensive Care Unit, seems to be improving.  She has no new complaints this morning.  She does say that she has been more short of breath in the last several months than she had been previously.  Her exam shows that her temperature is 97.4, pulse 77, respirations 20, blood pressure 126/76 and O2 sats 98% on 3 L.  Her chest is much clearer.  Her heart seems regular now, although she does have chronic atrial fibrillation.  Her CBC shows white count 8000, hemoglobin 11.6, platelets 138.  Her common metabolic profile shows her sodium is 132, BUN of 10, creatinine 0.59, bilirubin is 2.4.  She still has minimally elevated liver function testing.  Assessment then is that she is better.  She is approaching being ready for discharge.  I am going to have her get an echocardiogram to check on her left ventricular function, continue with everything else and follow.     Dezyre Hoefer L. Juanetta Gosling, M.D.     ELH/MEDQ  D:  09/03/2010  T:  09/03/2010  Job:  [id number]  Electronically Signed by Kari Baars M.D. on 09/03/2010 02:46:39 PM

## 2010-09-03 NOTE — Group Therapy Note (Signed)
  [name], [name]           ACCOUNT NO.:  [id number]  MEDICAL RECORD NO.:  [id number]           PATIENT TYPE:  I  LOCATION:  IC06                          FACILITY:  [facility]  PHYSICIAN:  Ernst Cumpston L. Juanetta Gosling, M.D.DATE OF BIRTH:  Apr 01, 1926  DATE OF PROCEDURE: DATE OF DISCHARGE:                                PROGRESS NOTE   [name] is still intubated on the ventilator.  She is being awakened from [place] and is not fully awake yet.  She has no new problems noted through the night by the nursing staff.  Exam shows her blood pressures in the high 90s, pulse in the 90s and irregular.  Her chest is relatively clear with some rhonchi.  Heart is irregular.  Abdomen is soft without masses.  Her lab work shows a BUN of 8, creatinine 0.65, glucose of 140, bilirubin is down to 1.8, albumin is 2.2, SGOT 77, SGPT 57, and her CBC shows white count is 9700, hemoglobin 12.7, platelets 117,000.  Blood gas shows on 35% O2, tidal volume of 500, rate of 12, 5 of PEEP shows pH 7.49, pCO2 of 36, pO2 of 71.6.  ASSESSMENT:  Overall I think she is better but still with problems.  PLAN:  We are going to see if she can potentially get to the point of possible extubation.  When she is extubated, we will need to have anesthesia standing by because she has severe problems with swelling of the airway when she was reintubated.     Torell Minder L. Juanetta Gosling, M.D.     ELH/MEDQ  D:  08/30/2010  T:  08/30/2010  Job:  [id number]  Electronically Signed by Kari Baars M.D. on 09/03/2010 02:46:24 PM

## 2010-09-03 NOTE — Group Therapy Note (Signed)
  NAMELOLA, CZERWONKA           ACCOUNT NO.:  [id number]  MEDICAL RECORD NO.:  [id number]           PATIENT TYPE:  I  LOCATION:  IC06                          FACILITY:  [facility]  PHYSICIAN:  Konya Fauble L. Juanetta Gosling, M.D.DATE OF BIRTH:  [birth year]  DATE OF PROCEDURE: DATE OF DISCHARGE:                                PROGRESS NOTE   Ms. Fogleman remains in the ICU intubated and on the ventilator.  She has no new problems noted.  She did not wean yesterday.  Her physical examination shows that she is arousable. Her chest I think is a little clear than yesterday.  Her heart is irregular.  She is still in atrial fibrillation.  She does not have any edema.  She has an NG tube in place.  Her white blood count is 9300, hemoglobin is 12, platelets 129,000.  Blood gas on 35%, 512, 5 of PEEP shows pO2 of 82, pCO2 of 38, pH 7.49.  BMET shows her BUN is 10, creatinine 0.72, and chest x-ray looks unchanged.  ASSESSMENT:  She has respiratory failure.  She has had cholecystitis and cholecystectomy.  She appeared to be septic from that.  She is going to continue with her treatments, continue with her medications, and see if we can get her off the ventilator today.  She was a difficult intubation, so I have anesthesia standing by when she is extubated.     Darryll Raju L. Juanetta Gosling, M.D.     ELH/MEDQ  D:  08/31/2010  T:  08/31/2010  Job:  [id number]  Electronically Signed by Kari Baars M.D. on 09/03/2010 02:46:29 PM

## 2010-09-03 NOTE — Group Therapy Note (Signed)
  NAMEKYNDEL, EGGER           ACCOUNT NO.:  [id number]  MEDICAL RECORD NO.:  [id number]           PATIENT TYPE:  I  LOCATION:  IC06                          FACILITY:  [facility]  PHYSICIAN:  Delia Sitar L. Juanetta Gosling, M.D.DATE OF BIRTH:  Sep 22, 1925  DATE OF PROCEDURE: DATE OF DISCHARGE:                                PROGRESS NOTE   Ms. Bou is intubated and on the ventilator.  She had her surgery done yesterday.  It was able to be done laparoscopically and it was an attempt made to switch her from a #6 endotracheal tube to a #7, but that was unsuccessful.  She has a lot of edema of the vocal cords, etc. and it is felt that she probably should remain on the ventilator at least through tomorrow.  She is sedated, intubated on the ventilator.  PHYSICAL EXAMINATION:  GENERAL:  She is sedated. CHEST:  Fairly clear with some rales. HEART:  Regular without gallop. ABDOMEN:  Soft.  She has 1+ edema.  Her blood gas on 35%, 500, rate of 12 and 5 of PEEP shows a pH of 7.46, pCO2 of 32, pO2 of 94.4.  White blood count is 8900, hemoglobin is 12.8, platelets 122.  BMET shows BUN of 6, creatinine 0.53, glucose of 144. Hepatic function shows bilirubin is 2, her SGOT 63, SGPT 53.  Magnesium is 1.8, phosphorus 2.3.  Chest x-ray pending.  It is thought that there may be some pleural effusions.  ASSESSMENT:  She has had gallbladder surgery which was able to be done laparoscopically.  She apparently had what looked like some cirrhosis of the liver.  At that time that she had this done and had a liver biopsy done.  She has elevated liver function testing which I think is more related to the infection than to the cirrhosis.  She has, what appears to be, some volume overload probably because of the resuscitative efforts when she came in and was septic in appearance and my plan then is to give her Lasix today, continue all of the other medications, continue with her treatments, and have her followup after  that.     Maylon Sailors L. Juanetta Gosling, M.D.     ELH/MEDQ  D:  08/29/2010  T:  08/29/2010  Job:  [id number]  Electronically Signed by Kari Baars M.D. on 09/03/2010 02:46:18 PM

## 2010-09-03 NOTE — Group Therapy Note (Signed)
  NAMEMERIDETH, BOSQUE           ACCOUNT NO.:  [id number]  MEDICAL RECORD NO.:  [id number]          PATIENT TYPE:  LOCATION:                                 FACILITY:  PHYSICIAN:  Lebaron Bautch L. Juanetta Gosling, M.D.DATE OF BIRTH:  27-Jul-1925  DATE OF PROCEDURE: DATE OF DISCHARGE:                                PROGRESS NOTE   HISTORY OF PRESENT ILLNESS:  Ms. Stice is admitted with what appears to have been cholangitis and sepsis from that.  She had an ERCP yesterday and had a lot inflammation in the area of the ampulla and then developed a respiratory failure post ERCP and had to be reintubated and now is intubated and on a ventilator.  She has required 60% O2 so I do not think we have much opportunity to try to get her off yet.  PHYSICAL EXAMINATION:  GENERAL:  Her physical examination shows that she is sedated. RESPIRATORY:  Her chest shows rhonchi bilaterally. CARDIOVASCULAR:  Her heart is irregular with a rate of about 110. ABDOMEN:  Soft.EXTREMITIES:  No edema.  LABORATORY DATA:  Comp metabolic profile showed her potassium is 3.5, SGOT 68, SGPT 95, albumin is 2.6, amylase is 13.  CBC shows white count 8300, hemoglobin 11.3, platelets 98,000.  Her platelet count has dropped some, blood gas on 60% 500 rate of 12 and 5 of PEEP shows pH 7.39, pCO2 of 39, pO2 of 103, and her chest x-ray is pending.  ASSESSMENT:  She has multiple medical problems.  She has now got respiratory failure.  She was intubated on the ventilator and at this point I am going to plan to give her a small dose of Lasix.  She has received large-volume IV fluids and I will also have her continue with her other treatments.  I have discussed her situation with Dr. Lovell Sheehan. He plans to have her surgery on Monday 23, 2012, which I think is appropriate.  We will plan to continue all of her other treatments in the meantime.     Peyton Spengler L. Juanetta Gosling, M.D.     ELH/MEDQ  D:  08/26/2010  T:  08/26/2010  Job:   [id number]  Electronically Signed by Kari Baars M.D. on 09/03/2010 02:46:14 PM

## 2010-09-03 NOTE — Group Therapy Note (Signed)
  NAMESENAI, RAMNATH           ACCOUNT NO.:  [id number]  MEDICAL RECORD NO.:  [id number]           PATIENT TYPE:  LOCATION:                                 FACILITY:  PHYSICIAN:  Candies Palm L. Juanetta Gosling, M.D.DATE OF BIRTH:  [birth year]  DATE OF PROCEDURE: DATE OF DISCHARGE:                                PROGRESS NOTE   Ms. Zelaya is improved.  She has been extubated and tolerated extubation well.  She says she is having some discomfort in her right pelvic area.  PHYSICAL EXAMINATION:  PELVIS:  She has some mild tenderness in the area of the right pelvis. CHEST:  Clear.  She has some rhonchi. HEART:  Regular. ABDOMEN:  Soft. EXTREMITIES:  Trace edema. CNS:  Grossly intact.  ASSESSMENT:  She is improved.  PLAN:  Continue with her current treatments and medications.  She is in atrial fibrillation.  I am going to restart Coumadin now.  She will continue with her other meds.  I think we can put her on Lovenox until we get the Coumadin reestablished.  I am going to leave her in the ICU overnight, probably move her in the morning.  I am going to have her get another chest x-ray because she is coughing.  I will ask for PT consultation to try to get her moving.     Yasmen Cortner L. Juanetta Gosling, M.D.     ELH/MEDQ  D:  09/01/2010  T:  09/01/2010  Job:  [id number]  Electronically Signed by Kari Baars M.D. on 09/03/2010 02:46:36 PM

## 2010-09-03 NOTE — Group Therapy Note (Signed)
  [name], DIVELEY           ACCOUNT NO.:  [id number]  MEDICAL RECORD NO.:  [id number]           PATIENT TYPE:  I  LOCATION:  IC06                          FACILITY:  [facility]  PHYSICIAN:  Shonika Kolasinski L. Juanetta Gosling, M.D.DATE OF BIRTH:  06-15-1925  DATE OF PROCEDURE: DATE OF DISCHARGE:                                PROGRESS NOTE   Ms. Infantino was admitted yesterday and appeared to be septic probably from acute cholecystitis and cholangitis perhaps.  She is better this morning.  She says she is still nauseated.  She has headache but otherwise seems to be doing better.  Her pulse is about 110, blood pressure 120/70.  Her chest relatively clear.  Heart is regular.  She is still tender diffusely in the abdomen.  Her lab work shows her bilirubin is 7, SGOT 122, SGPT 143, albumin is 2.6.  Electrolytes were normal. Lactate is 1.1 much improved.  Amylase is 15.  Her white blood count is 15,200 compared to 19,000 yesterday.  Her pro time is still up at 2.11 INR.  I discussed her situation with Dr. Lovell Sheehan and he suggested we go ahead and give her some fresh frozen plasma.  He had discussed her situation with Dr. Karilyn Cota and there is some question as to whether she is going to have a ERCP today.  So I will go ahead and give her some fresh frozen.  She will be okay for either surgery or ERCP if needed.     Monesha Monreal L. Juanetta Gosling, M.D.     ELH/MEDQ  D:  08/25/2010  T:  08/25/2010  Job:  [id number]  Electronically Signed by Kari Baars M.D. on 09/03/2010 02:45:47 PM

## 2010-09-03 NOTE — Group Therapy Note (Signed)
  NAMEMARILEE, [name]           ACCOUNT NO.:  [id number]  MEDICAL RECORD NO.:  [id number]           PATIENT TYPE:  I  LOCATION:  IC06                          FACILITY:  [facility]  PHYSICIAN:  Michi Herrmann L. Juanetta Gosling, M.D.DATE OF BIRTH:  [birth year]  DATE OF PROCEDURE: DATE OF DISCHARGE:                                PROGRESS NOTE   [name] is overall I think about the same.  She seems to be doing fairly well.  She remains intubated on the ventilator and sedated, although her sedation has been decreased somewhat.  She is set for surgery tomorrow for a cholecystectomy and that is one of the reasons we elected not to go ahead and try to work on extubation because she is going to have to be put to sleep for her surgery.  There is a fairly high likelihood that this may be an open procedure because she has so much inflammation and scarring in the area of the gallbladder fossa on ERCP.  Exam as mentioned, she is intubated and on the ventilator.  Her blood pressure is 111/54, pulse 92.  Blood gas shows pO2 of 82, pCO2 of 36, pH 7.43.  Her I&O is +673.  Her white blood count has come down. Chest is clear.  Heart is regular.  She looks much better in general.  ASSESSMENT:  She was septic from a gallbladder.  She has multiple other medical problems, chronic obstructive pulmonary disease, coronary disease, valvular heart disease.  She has had a pulmonary embolus.  She has had an aneurysm.  She seems to be doing better but still having significant problems.  Plan then is to continue her current treatments and medications, no changes, and follow.     Roben Schliep L. Juanetta Gosling, M.D.     ELH/MEDQ  D:  08/27/2010  T:  08/27/2010  Job:  [id number]  Electronically Signed by Kari Baars M.D. on 09/03/2010 02:46:05 PM

## 2010-09-03 NOTE — Group Therapy Note (Signed)
  [name], PETHTEL           ACCOUNT NO.:  [id number]  MEDICAL RECORD NO.:  [id number]           PATIENT TYPE:  I  LOCATION:  IC06                          FACILITY:  [facility]  PHYSICIAN:  Henrine Hayter L. Juanetta Gosling, M.D.DATE OF BIRTH:  Feb 26, 1926  DATE OF PROCEDURE: DATE OF DISCHARGE:                                PROGRESS NOTE   Ms. Uram is overall about the same.  She remains intubated on the ventilator and sedated.  Plans were made for her to have surgery for her gallbladder today.  She did not have any new problems noted through the night.  Her exam shows that she is sedated, intubated.  Chest shows rhonchi bilaterally.  Heart is regular.  Abdomen is soft.  Her blood cultures thus far are negative.  Her blood gas shows pH 7.42, pCO2 of 38, pO2 of 85.  CBC shows white count 5000, hemoglobin 11.7, platelets 106,000, and a comp metabolic profile shows a potassium 3.7, glucose of 124.  Her bilirubin has come down to 1.9, SGOT 39, SGPT 52, and her albumin is 2.1.  ASSESSMENT:  She is to have surgery today.  After she has a surgery, we will see if we can get a PICC line in when she starts nutritional support.  Continue with all of the other treatments and follow.     Taryn Nave L. Juanetta Gosling, M.D.     ELH/MEDQ  D:  08/28/2010  T:  08/28/2010  Job:  [id number]  Electronically Signed by Kari Baars M.D. on 09/03/2010 02:46:10 PM

## 2010-09-04 DIAGNOSIS — I517 Cardiomegaly: Secondary | ICD-10-CM

## 2010-09-04 LAB — PROTIME-INR
INR: 1.38 (ref 0.00–1.49)
Prothrombin Time: 17.2 seconds — ABNORMAL HIGH (ref 11.6–15.2)

## 2010-09-05 ENCOUNTER — Inpatient Hospital Stay
Admission: RE | Admit: 2010-09-05 | Discharge: 2010-10-05 | Disposition: A | Discharge status: Home / Self Care | Payer: BC Managed Care – PPO | Source: Ambulatory Visit | Attending: Pulmonary Disease | Admitting: Pulmonary Disease

## 2010-09-05 LAB — PROTIME-INR
INR: 1.18 (ref 0.00–1.49)
Prothrombin Time: 15.2 seconds (ref 11.6–15.2)

## 2010-09-13 NOTE — Discharge Summary (Signed)
[name], [name]           ACCOUNT NO.:  [id number]  MEDICAL RECORD NO.:  [id number]           PATIENT TYPE:  I  LOCATION:  A212                          FACILITY:  [facility]  PHYSICIAN:  Ashaya Raftery L. Juanetta Cross, M.D.DATE OF BIRTH:  Jul 14, 1925  DATE OF ADMISSION:  08/23/2010 DATE OF DISCHARGE:  LH                         DISCHARGE SUMMARY-REFERRING   FINAL DISCHARGE DIAGNOSES: 1. Acute cholecystitis with sepsis syndrome related to that. 2. Chronic atrial fibrillation. 3. History of deep venous thrombosis and pulmonary embolus. 4. Hyperlipidemia. 5. Status post pacemaker. 6. Coronary artery occlusive disease. 7. Status post brain aneurysm clipping. 8. Chronic obstructive pulmonary disease. 9. Electrolyte abnormalities related to her acute illness. 10.Respiratory failure postoperative. 11.Cirrhosis of the liver.  HISTORY:  [name] is a 77 year old Caucasian female who came to the emergency room because of abdominal pain.  She said this had started after her evening meal on April 18 and she vomited, did not have any diarrhea, but the pain was mostly in her right upper quadrant.  She has a extensive past medical history including atrial fibrillation, DVT, pulmonary embolus, hyperlipidemia, pacemaker, brain aneurysm, coronary artery occlusive disease.  She received Dilaudid in the emergency room, became very confused, so she underwent a CT of the brain which showed no significant change.  Exam on admission showed that she was well- developed, well-nourished female who was uncomfortable and sluggish. She said that she had seen Dr. Karilyn Cota in the past, so I asked him to see her.  Her heart rate was irregular.  Chest with rhonchi bilaterally. She was minimally confused.  Her white blood count was 8300, hemoglobin 14.1.  Electrolytes were normal.  Glucose of 137, BUN 13, creatinine 0.83.  Liver function testing was up with SGOT of 194, SGPT of 144, bilirubin was up to 3, PT was 2.13.   She is on chronic Coumadin therapy. CT of the abdomen showed distended gallbladder with cholelithiasis.  HOSPITAL COURSE:  She was started on Cipro and Flagyl.  She had her Coumadin reversed with fresh frozen plasma, underwent a ERCP with biliary sphincterotomy on the 20th by Dr. Karilyn Cota and then she ended up having to be intubated post ERCP.  She remained on the ventilator until the 24th at which time she underwent a laparoscopic cholecystectomy and had a liver biopsy.  Liver biopsy showed some cirrhosis of liver.  She came off the ventilator about 2 days later.  She remained on antibiotics.  She improved over the next several days.  She still had some slight elevation of her bilirubin.  At about 2 by the time of discharge, she was much improved.  She is being transferred to the [facility] to be treated to have PT, OT, and speech as needed.  She will be on a no added salt regular diet.  She will be on allopurinol 300 mg daily, Lanoxin 125 mg daily, diltiazem 240 mg daily, Lasix 40 mg p.o. daily, Lopressor 25 mg p.o. q.12 h., Protonix 40 mg p.o. daily, MiraLax 17 grams b.i.d. and this can be held if she starts developing diarrhea, Tylenol 650 mg p.o. q.4 h. p.r.n. pain, Coumadin 5 mg p.o. daily with  PT/INR daily, Ultram 50 mg p.o. q.6 h. p.r.n. pain, Zofran 4 mg p.o. q.6 h. p.r.n. nausea.  She has a PICC line in place now that will be discontinued and anticipated is that she will be able to have rehab and then transferred home.     [name], M.D.     ELH/MEDQ  D:  09/05/2010  T:  09/05/2010  Job:  [id number]  Electronically Signed by Kari Baars M.D. on 09/13/2010 12:37:30 PM

## 2010-09-13 NOTE — Group Therapy Note (Signed)
  NAMEKYLEN, [name]           ACCOUNT NO.:  [id number]  MEDICAL RECORD NO.:  [id number]           PATIENT TYPE:  LOCATION:                                 FACILITY:  PHYSICIAN:  Alondra Vandeven L. Juanetta Gosling, M.D.DATE OF BIRTH:  [birth year]  DATE OF PROCEDURE: DATE OF DISCHARGE:                                PROGRESS NOTE   [name] overall looks about the same.  She is still complaining of some right lower quadrant pain that is mostly when she coughs and she is coughing, but not clearing her secretions.  Her blood pressure is in the 90s systolic blood.  Her pulse rate will rise up into the 110-120 range when she is active, but is down in the 80s when she is not.  Her chest is actually fairly clear.  Her heart is irregular with atrial fibrillation which is a chronic finding.  Extremities do not show any edema.  Her abdomen is actually fairly soft.  She does not really have any tenderness.  Her CNS grossly intact.  ASSESSMENT:  I think she is overall better.  She does have atrial fibrillation.  She has chronic obstructive pulmonary disease.  She has had a cholecystectomy.  She was septic from cholecystitis and plan then I am going to move around the Intensive Care Unit.  She has PT consult in place.  We are going to need to modify some of her medications.  She is going to have Foley catheter removed and I will have her on a monitored room.  I think we can restart anticoagulation now.  Otherwise, continue with meds and treatments and follow.     [name] L. Juanetta Gosling, M.D.     ELH/MEDQ  D:  09/02/2010  T:  09/02/2010  Job:  [id number]  Electronically Signed by Kari Baars M.D. on 09/13/2010 12:37:32 PM

## 2010-09-13 NOTE — Group Therapy Note (Signed)
  NAMETEVA, BRONKEMA           ACCOUNT NO.:  [id number]  MEDICAL RECORD NO.:  [id number]           PATIENT TYPE:  LOCATION:                                 FACILITY:  PHYSICIAN:  Toy Samarin L. Juanetta Gosling, M.D.DATE OF BIRTH:  1925/08/11  DATE OF PROCEDURE: DATE OF DISCHARGE:                                PROGRESS NOTE   [name] is much improved.  She is fell yesterday, actually sort of slid out of bed, with no obvious injury.  She says she is feeling okay. She is not short of breath right now.  She is coughing some.  She still has some pain in the right lower quadrant but is better.  PHYSICAL EXAMINATION:  VITAL SIGNS:  Temperature is 98.4, pulse 88, respirations 20, blood pressure 110/69, O2 sats 94% on 2 liters. CHEST:  Much clearer. HEART:  Irregular. ABDOMEN:  Soft. EXTREMITIES:  No edema and she looks much better.  ASSESSMENT:  She is doing better.  PLAN:  I am going to see if we can get her set for transfer to a skilled care facility for rehab.     Henderson Frampton L. Juanetta Gosling, M.D.     ELH/MEDQ  D:  09/04/2010  T:  09/04/2010  Job:  [id number]  Electronically Signed by Kari Baars M.D. on 09/13/2010 12:37:34 PM

## 2010-09-13 NOTE — Group Therapy Note (Signed)
  [name]           ACCOUNT NO.:  [id number]  MEDICAL RECORD NO.:  [id number]           PATIENT TYPE:  LOCATION:                                 FACILITY:  PHYSICIAN:  Jaysiah Marchetta L. Juanetta Gosling, M.D.DATE OF BIRTH:  Jun 01, 1925  DATE OF PROCEDURE: DATE OF DISCHARGE:                                PROGRESS NOTE   [name] says she is feeling well and looks much better.  She is set for transfer to the [facility] today.  She has no other new complaints.  PHYSICAL EXAMINATION:  VITAL SIGNS:  Temperature is 97.3, pulse 90, respirations 20, blood pressure 105/66, O2 sats 94%. CHEST:  Clear. HEART:  Regular. ABDOMEN:  Soft.  ASSESSMENT:  She is much improved.  Her echocardiogram did not show any significant problems with left ventricular dysfunction, so my plan is to transfer her to the [facility] today.  Please see discharge summary for details.     [name] L. Juanetta Gosling, M.D.     ELH/MEDQ  D:  09/05/2010  T:  09/05/2010  Job:  [id number]  Electronically Signed by Kari Baars M.D. on 09/13/2010 12:37:37 PM

## 2010-09-19 NOTE — Assessment & Plan Note (Signed)
[facility]                       [place] [facility]IOLOG[facility] OFFICE NOTE   NAME:GILLIETomiko, Schoon                  MRN:          [id number]  DATE:02/11/2007                            DOB:          Jun 03, 1925    [name] returns today for followup.  She has chronic atrial  fibrillation with sick sinus syndrome and pacemaker backup.  She has a  history of a PE in 2004 with chronic Coumadin.   In talking to the patient, she has not had any significant palpitations,  PND, or orthopnea.  There has been no chest pain.  She had a  catheterization in 2001 with normal coronaries.   She has been somewhat active.  She has primarily complained about  somnolence.  She says she falls asleep during the day on multiple  occasions.  There is no previous history of narcolepsy.  It may be  related to her beta blocker therapy.  The last time I saw her she was  relatively hypotensive with a systolic pressure of 95 to 100, and a  pulse of 60 with pacing. Although she was not postural at the time, we  cut her Cardizem back to 240 once a day.  Her Lopressor has been cut  back from 200 b.i.d. to 100 b.i.d.  She seems to think the Lopressor may  be involved with this, and clearly it may be.  I told her we would cut  her Lopressor back further to 50 b.i.d.  Rate control does not seem to  be that hard, and she has pacemaker backup.  I am not adverse to her  being only on Cardizem if the Lopressor contributing to somnolence.   Her pacemaker has been interrogated transtelephonically, and is working  well.   REVIEW OF SYSTEMS:  Otherwise negative.   CURRENT MEDICATIONS:  Include:  1. Aspirin daily.  2. Cardizem 240 a day.  3. Coumadin as directed.  4. Lopressor to be decreased to 50 b.i.d.   EXAMINATION:  Remarkable for a health-appearing white female in no  distress.  Affect is appropriate.  Pulse is 60 and paced.  Blood pressure is 98/70.  She is not postural.  It is  equal in both arms.  Respiratory rate is 14.  Weight is 223.  She  is afebrile.  HEENT:  Normal.  Carotids normal without bruit.  There is no lymphadenopathy.  No  thyromegaly.  No JVP elevation.  LUNGS:  Clear with good diaphragmatic motion.  No wheezing.  There is an S1 and S2 with an aortic sclerosis murmur.  PMI is normal.  ABDOMEN:  Benign.  Bowel sounds positive.  No tenderness.  No AAA.  No  bruit.  No hepatosplenomegaly.  No hepatojugular reflux.  Femorals were +3 bilaterally without bruit.  PTs are +2.  There is no  lower extremity edema.  NEUROLOGIC:  Nonfocal.  There is no muscular weakness.  SKIN:  Warm and dry.  Pacemaker pocket is in good position under the  right clavicle.   Her baseline EKG shows atrial fibrillation with intermittent V-pacing.   IMPRESSION:  1. Paroxysmal atrial fibrillation.  Good rate control.  Continue      Coumadin and Cardizem.  Titrate back Lopressor.  2. Somnolence.  She may need a further sleep study workup.  However,      for the time being we will continue to cut back on her metoprolol.  3. Relative hypotension.  Cutting back on medications.  Encouraged the      patient to hydrate.  Ejection fraction has been normal by echo.  4. Aortic sclerosis murmur.  Followup echo in a year.  No need for SB      prophylaxis.  Second heart sound well preserved.  Doubt critical      aortic stenosis.  5. History of bilateral pulmonary embolus.  On chronic Coumadin      therapy.  Check INR today.   Overall, I think she will do better if we continue to titrate back her  medicines.  I will see her in 8 to 10 weeks to see how she is doing.  She may still need further followup in regards to her hypersomnolence  during the day.     Noralyn Pick. Eden Emms, MD, [facility]  Electronically Signed    PCN/MedQ  DD: 02/11/2007  DT: 02/11/2007  Job #: [id number]

## 2010-09-19 NOTE — Assessment & Plan Note (Signed)
[facility]                       [place] CARDIOLOGY OFFICE NOTE   NAME:[name], [name]                  MRN:          [id number]  DATE:07/26/2008                            DOB:          [birth year]    [name] comes in today for followup.  She has multiple complaints  today, but mostly she says I am carrying around too much weight.  She  says she eats very little, but is very inactive.   She has the following problems:  1. Symptomatic bradycardia status post Medtronic segment dual-chamber      pacer.  Last checked by Dr. Lewayne Bunting on February 04, 2008.  2. Chronic atrial fibrillation.  3. Chronic Coumadin therapy.  4. Status post pacer as mentioned above.   MEDICATIONS:  1. Coumadin as directed.  2. Diltiazem 240 mg a day.  3. Aspirin 81 mg a day.  4. Metoprolol 50 mg p.o. b.i.d.   PHYSICAL EXAMINATION:  VITAL SIGNS:  Her blood pressure is 100/74, pulse  is 71 and slightly irregular.  Her weight is 224, which is actually down  3.  HEENT:  Unremarkable.  NECK:  Supple.  Carotid upstrokes were equal bilaterally without bruits.  No thyroid enlargement.  Trachea is midline.  LUNGS:  Clear to auscultation and percussion.  HEART:  Poorly appreciated PMI, irregular rate and rhythm.  ABDOMEN:  Soft, good bowel sounds.  Organomegaly cannot be assessed.  EXTREMITIES:  No significant edema.  Pulses were present.  She has some  venous varicosities.  No sign of DVT.  NEUROLOGIC:  Grossly intact.   Her EKG shows atrial fib with a demand ventricular paced rhythm at  times.   INR is 2.0.   She does have a subconjunctival hemorrhage in the left eye.  She had a  cold and has been coughing some.   PLAN:  1. Continue current medication.  2. Try to continue to drop weight.  3. Increase activity as outlined in her previous notes.   I will plan on seeing her back again in 6 months.     Thomas C. Daleen Squibb, MD, [facility]  Electronically  Signed    TCW/MedQ  DD: 07/26/2008  DT: 07/27/2008  Job #: [id number]

## 2010-09-19 NOTE — Consult Note (Signed)
NAMESHELAINE, FRIE           ACCOUNT NO.:  [id number]   MEDICAL RECORD NO.:  [id number]          PATIENT TYPE:  AMB   LOCATION:  DAY                           FACILITY:  [facility]   PHYSICIAN:  R. Roetta Sessions, M.D. DATE OF BIRTH:  09-03-1925   DATE OF CONSULTATION:  03/31/2008  DATE OF DISCHARGE:                                 CONSULTATION   PHYSICIAN REQUESTING CONSULTATION:  Oneal Deputy. Juanetta Gosling, MD   REASON FOR CONSULTATION:  Blood in the stool.   HISTORY OF PRESENT ILLNESS:  The patient is a very pleasant 77 year old  Caucasian female on chronic Coumadin therapy, given a history of AFib as  well as DVT in blood and pulmonary emboli.  She saw Dr. Juanetta Gosling recently  with complaints of blood per rectum.  She states she has had an  occasional small volumes of hematochezia, she felt it was from  hemorrhoids.  More recently, she has had increased frequencies of  stools.  She started taking Metamucil again like she had been instructed  in the past by Dr. Karilyn Cota.  She states this has normalized her stools  somewhat.  Two days prior to seeing Dr. Juanetta Gosling on March 10, 2008, she  had a couple of stools, which appeared to have black spots or blood  clots mixed in.  This occurred 2 times on Monday and again on Tuesday.  She saw Dr. Juanetta Gosling on Wednesday and was found to have swollen  hemorrhoids.  It was felt that her bleeding was probably secondary to  hemorrhoids because of her colonoscopy in remote past.  He felt she  needed to have followup.  She really denies any abdominal pain.  Denies  any nausea or vomiting, heartburn, dysphagia, or odynophagia.  Denies  any weight loss.   CURRENT MEDICATIONS:  1. Metoprolol 50 mg b.i.d.  2. Aspirin 81 mg daily.  3. Diltiazem 240 mg daily.  4. Coumadin 2.5 mg on Tuesdays and 5 mg other days.  5. Metamucil daily.   ALLERGIES:  PENICILLIN, CODEINE, and BEESTINGS.   PAST MEDICAL HISTORY:  1. Status post permanent pacemaker insertion for  symptomatic      bradycardia.  2. History of atrial fibrillation.  3. Chronic Coumadin therapy.  4. Sick sinus syndrome with pacemaker backup placed in the OM1.  5. History of aortic sclerosis with murmur.  6. Hypertension.  7. Prior colonoscopy revealed pancolonic diverticulosis.  Most of the      diverticula concentrated in the sigmoid colon, large external skin      tags, and external hemorrhoids.  Colonoscopy in July 2004 by Dr.      Karilyn Cota.  8. She has a history of hyperthyroidism in 1960s, treated with      medication, remains in remission.  9. Bilateral cataract surgery in 2003.  10.History of brain aneurysm status post surgery in [facility] in February 2004.  Few days after      discharge, she developed a DVT and pulmonary emboli.  She was      already on Coumadin prior to the surgery for AFib.  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  12.History of surgery for endometriosis.   FAMILY HISTORY:  Mother deceased at age 70 with MI, father deceased at  age 80 with MI.  No family history of colorectal cancer.   SOCIAL HISTORY:  She is widowed.  Retired from [employer].  Quit  smoking in the 70s.  No alcohol use.   REVIEW OF SYSTEMS:  GI:  See HPI.  CONSTITUTIONAL:  No weight loss.  CARDIOPULMONARY:  No chest pain, shortness of breath, palpitations, or  cough.  GENITOURINARY:  No dysuria or hematuria.   PHYSICAL EXAMINATION:  VITAL SIGNS:  Weight 226.5, height 5 feet and 9  inches, temp 98, blood pressure 100/62, and pulse 64.  GENERAL:  Pleasant, well-nourished, well-developed Caucasian female in  no acute distress.  SKIN:  Warm and dry.  No jaundice.  HEENT:  Sclerae nonicteric.  Oropharyngeal mucosa moist and pink.  No  lesions, erythema, or exudate.  No lymphadenopathy or thyromegaly.  CHEST:  Lungs are clear to auscultation.  CARDIAC:  Regular rate and rhythm.  Normal S1  and S2.  No murmurs, rubs,  or gallops.  ABDOMEN:  Positive bowel sounds.  Abdomen is soft, nontender, and  nondistended.  No organomegaly or masses.  No rebound or guarding.  No  abdominal bruits or hernias.  LOWER EXTREMITIES:  No edema.  RECTAL:  Deferred, was done recently by Dr. Juanetta Gosling and found to have  external hemorrhoids.   LABORATORY DATA:  On March 10, 2008,  CBC normal.   IMPRESSION:  The patient is an 77 year old lady with recent  hematochezia, likely due to her hemorrhoidal source in the setting of  anticoagulation therapy.  She does have problems with her hemorrhoids  intermittently with erythema and irritation and occasional bleeding.  Her last colonoscopy was over 5 years ago.  We will consider a repeat  colonoscopy at this time for diagnostic purposes, to ensure that there  is no other etiologies for her rectal bleeding.  She is on chronic  Coumadin therapy given her history of atrial fibrillation.  She also has  had deep vein thrombosis and pulmonary embolism postoperatively and  status post aneurysm repair in 2004.  She previously has required  Lovenox bridges in the past.   PLAN:  1. Colonoscopy in the near future.  We will discuss further with Dr.      Jena Gauss with regards to whether or not he      would like her off her anticoagulation.  If so, I will address this      further with Dr. Juanetta Gosling to determine whether or not she will need      to have Lovenox bridging.  2. Further recommendations to follow.      Tana Coast, P.AJonathon Bellows, M.D.  Electronically Signed    LL/MEDQ  D:  03/31/2008  T:  04/01/2008  Job:  [id number]   cc:   Ramon Dredge L. Juanetta Gosling, M.D.  Fax: [phone]

## 2010-09-19 NOTE — Assessment & Plan Note (Signed)
[facility]                       [place] CARDIOLOGY OFFICE NOTE   NAME:GILLIECoryn, Mosso                  MRN:          [id number]  DATE:11/05/2007                            DOB:          20-Feb-1926    Shenea returns for followup.  She has had a history of murmur with  aortic sclerosis and chronic atrial fibrillation on Coumadin, also with  a history of DVT.  She has sick sinus syndrome with pacemaker backup.  Pacemaker was placed in May 2001.   She has no documented coronary disease.  She has been doing fairly well.  She has not had any significant palpitations, PND, or orthopnea.  Her  INR was little high today at 3.5.  She has not had any bleeding  diathesis.   REVIEW OF SYSTEMS:  Remarkable for what appears to be a wart on the  right index finger.   Otherwise negative.   MEDICATIONS:  1. Baby aspirin a day.  2. Coumadin as directed.  3. Cardizem 240 a day.  4. Metoprolol 50 b.i.d.   ALLERGIES:  She is allergic to PENICILLIN and Codeine   PHYSICAL EXAMINATION:  GENERAL:  Exam is remarkable for an overweight  elderly white female in no distress.  VITAL SIGNS:  Weight is 227, blood pressure is 116/70, pulse is 68 and  irregular, respiratory rate 14, and afebrile.  HEENT:  Unremarkable.  Carotids normal without bruit.  No  lymphadenopathy, thyromegaly, or JVP elevation.  LUNGS:  Clear.  Good diaphragmatic motion.  No wheezing.  S1 and S2 with  aortic sclerosis murmur.  PMI normal.  ABDOMEN:  Benign.  Bowel sounds positive.  No AAA.  No tenderness.  No  bruit.  No hepatosplenomegaly.  No hepatojugular reflux.  EXTREMITIES: Distal pulses are intact.  No edema.  NEURO:  Nonfocal.  SKIN:  Warm and dry with what appears to be a wart in the right index  finger.  MUSCULOSKELETAL:  No muscular weakness.   IMPRESSION:  1. Chronic atrial fibrillation, good rate control.  Hold Coumadin      tonight.  Resume regular dosing after that.   Follow up in Coumadin      Clinic in 4 weeks.  2. Aortic sclerosis murmur.  Follow up echo in 2 years and no need for      SBE prophylaxis.  3. Wart in the right index finger.  Follow up with dermatology.  Even      though she is on Coumadin, I suspect that it can be frozen with      liquid nitrogen.  4. Hypertension, currently well-controlled.  Continue Cardizem and      metoprolol.  5. History of deep venous thrombosis.  No evidence of recurrence.  On      Coumadin for atrial fibrillation anyway.   The patient is doing fine.  She will follow up with one of my room  regional colleagues in 6 months.     Noralyn Pick. Eden Emms, MD, [facility]  Electronically Signed    PCN/MedQ  DD: 11/05/2007  DT: 11/06/2007  Job #: [id number]

## 2010-09-19 NOTE — Consult Note (Signed)
[name], [name]           ACCOUNT NO.:  [id number]  MEDICAL RECORD NO.:  [id number]           PATIENT TYPE:  I  LOCATION:  IC06                          FACILITY:  [facility]  PHYSICIAN:  Lionel December, M.D.    DATE OF BIRTH:  12-22-1925  DATE OF CONSULTATION: DATE OF DISCHARGE:                                CONSULTATION   REASON FOR CONSULTATION:  Cholelithiasis, elevated transaminases.  HISTORY OF PRESENT ILLNESS:  Please note the patient has a fever and has received pain medication and is somewhat confused this morning.  Part of my dictation was gathered from Dr. Juanetta Gosling' notes.  [name] is an 77- year-old female, admitted last night through the emergency department with complaints of abdominal pain.  Specifically, she was having right upper quadrant pain and back pain.  Her pain started yesterday.  She underwent a CT scan of the abdomen and pelvis without contrast which revealed distended gallbladder with cholelithiasis.  The appearance is nonspecific but could represent cholecystitis in the appropriate clinical setting.  No obvious inflammatory changes.  Diffuse fatty infiltration of the liver.  Small umbilical hernia containing fat. Atrophic uterus with prominent endometrium.  Endometrial hyperplasia should be excluded.  Left pelvic kidney is otherwise normal in appearance.  It is also noted that her bilirubin was elevated at 3.0, AST 194, ALT 144.  Her ALP was actually normal at 98.  Sodium 139, potassium 3.9, chloride 102, glucose 137, BUN 13, creatinine 0.83.  Her WBC count 8.3, RBC 4.14, hemoglobin was normal at 14.1, hematocrit was 42.0, her MCV was elevated at 101.4.  Lipase was 25, PT/INR 24 and 2.13.  PAST MEDICAL HISTORY:  Includes atrial fibrillation.  She has had a brain aneurysm which was clipped.  The surgery was done at [facility] She also developed a DVT and a pulmonary embolus.  Per Dr. Juanetta Gosling' note, she has a pacemaker and history of  hyperlipidemia.  She has had bilateral cataracts surgery in the past.  She is allergic to PENICILLIN and CODEINE.  She is widowed.  She has never had any children.  Her father is deceased at age 53 from an MI.  Her mother was deceased at 52.  Two brothers and two sisters are deceased.  HOME MEDICATIONS: 1. Diltiazem 240. 2. Coumadin 5 mg. 3. Metoprolol 25 mg, questionable b.i.d. 4. Aspirin 81 mg a day. 5. Gabapentin 300 mg as needed. 6. Allopurinol 300 mg daily.  OBJECTIVE:  VITAL SIGNS:  Her blood pressure is 103/47, her temp is 101.3, her pulse is 122 and irregular, respirations 18. HEENT:  Her sclerae are anicteric.  Her conjunctivae are pink.  She has natural teeth.  Her oral mucosa is moist.  There are no lesions. NECK:  Her thyroid is normal.  There is no cervical lymphadenopathy. HEART:  Her heart is irregular as I said. ABDOMEN:  Soft.  Bowel sounds are positive.  She has tenderness to the epigastric region and right upper quadrant.  ASSESSMENT:  [name] is an 77 year old female admitted with abdominal pain.  A CT scan revealed a distended gallbladder with cholelithiasis. given her presentation, suspect we are dealing with  biliary sepsis. She will need therapeutic ERCP as soon as her conditions improves. Furthermore her coagulopathy will need to be corrected. She may also  need Cholecystectomy.  RECOMMENDATIONS:  We agree with Cipro and Flagyl.  Agree with blood cultures considering her temp of 101.3.  Further recommendations to follow once we have the ultrasound of the abdomen.   Thank you for allowing [place] to participate in her care.    ______________________________ Dorene Ar, NP   ______________________________ Lionel December, M.D.    TS/MEDQ  D:  08/24/2010  T:  08/24/2010  Job:  [id number]  Electronically Signed by Dorene Ar PA on 09/01/2010 10:46:21 AM Electronically Signed by Lionel December M.D. on 09/19/2010 05:27:51 PM

## 2010-09-19 NOTE — Assessment & Plan Note (Signed)
[place] [facility]                         ELECTROPHYSIOLOGY OFFICE NOTE   NAME:GILLIEKalyani, Maeda                  MRN:          [id number]  DATE:01/16/2007                            DOB:          04/06/1926    Ms. Haacke was seen today in the [facility] on 16 January 2007,  for followup of her Medtronic, model #303B, Sigma, date of implant was  Sep 13, 1999, for a sick sinus syndrome.   On interrogation of her device today,  Her battery voltage is 2.78.  R wave measured greater than 11.2 millivolts with a ventricular pacing  threshold of 0.5 volts at 0.4 milliseconds and a ventricular lead  impedance of 535 ohms.  There were greater than 254 high ventricular rate episodes noted.  She has an underlying rhythm today of atrial fibrillation and is on  Coumadin therapy.  She is only V pacing about 13.6% of the time.  When I did a history of her medications, the dosage on the bottle of her  metoprolol was for 100 mg two twice a day.  She says that they decreased  it and she is now only taking 100 mg twice a day because she was very  fatigued and no energy at all but given her high ventricular rates noted  here, we may need to adjust this medication.  She also sees Dr. Eden Emms  here in the [facility].  I am going to set her up to see him in  the near future.  No changes were made in her pacing parameters.   She will continue with her monthly telephone checks through [employer], and  I will see her back to check her pacemaker in 1 year's time.      [name], LPN  Electronically Signed      Doylene Canning. Ladona Ridgel, MD  Electronically Signed   PO/MedQ  DD: 01/16/2007  DT: 01/16/2007  Job #: [id number]

## 2010-09-19 NOTE — Op Note (Signed)
NAMEMACARENA, [name]           ACCOUNT NO.:  [id number]  MEDICAL RECORD NO.:  [id number]           PATIENT TYPE:  I  LOCATION:  IC06                          FACILITY:  [facility]  PHYSICIAN:  Lionel December, M.D.    DATE OF BIRTH:  1925-10-26  DATE OF PROCEDURE:  08/25/2010 DATE OF DISCHARGE:                              OPERATIVE REPORT   PROCEDURE:  ERCP with biliary sphincterotomy.  INDICATIONS:  [name] is an 77 year old Caucasian female who presented with biliary sepsis.  On presentation, she appeared to be in septic shock.  She has improved greatly in the last 24 hours with antibiotic therapy, fluids, and short treatment course with Levophed. Her bilirubin has almost doubled since admission.  CT showed a mildly dilated bile duct.  She has cholelithiasis and thickened gallbladder wall.  It is suspected that she may have common duct stone since her bilirubin keeps creeping up.  She is undergoing therapeutic ERCP. Procedure risks were reviewed with the patient.  Informed consent was obtained.  MEDICATIONS FOR SEDATION/ANESTHESIA:  Please see Anesthesia records for details.  FINDINGS:  Procedure performed in the OR.  Once the patient was intubated and placed in semi-prone position, an Olympus video duodenoscope was passed via oropharynx without any difficulty into esophagus and stomach.  She had some antral edema and granularity. Pyloric channel was somewhat narrowed.  The scope was passed across into bulb and descending duodenum.  Ampulla Vater had long intramural segment with edema and erythema to mucosa around the orifice.  CBD was easily cannulated with RX 44 Autotome and 035 Hydra Jagwire.  It was filled with dilute contrast.  CBD and CHD were mildly dilated about 9-10 mm or no filling defects were evident.  Cystic duct was patent, long, and tortuous.  Intrahepatic biliary radicals were partially filled and no abnormality was noted.  There was not much flow of contrast  into the duodenum.  Therefore, I proceeded with the biliary sphincterotomy.  As the sphincterotomy was completed, there was gush of bile and contrast in the duodenum.  No bleeding was noted during this procedure.  Balloon stone extractor was advanced over the guidewire into the common hepatic duct and trolled and insufflated in this position and no stones were noted.  The sphincterotomy size was felt to be adequate.  Endoscope was withdrawn.  The patient tolerated the procedure well and she is in the process of being extubated.  FINAL DIAGNOSES: 1. Nonerosive antral gastritis with noncritical narrowing to pyloric     channel. 2. Mildly dilated common bile duct and common hepatic duct without any     filling defects. 3. Edema to ampullary mucosa around the orifice. 4. Patent cystic duct. 5. Biliary sphincterotomy performed to establish biliary drainage.  6. Pancreatic duct was not cannulated or filled with contrast.  RECOMMENDATIONS: 1. We will continue with her Cipro and Flagyl.  Clear liquids today. 2. Lab studies will be repeated in a.m. 3. Lap chole per Dr. Lovell Sheehan. 4. No antiplatelet agents or anticoagulants for the next 72 hours.     Lionel December, M.D.     NR/MEDQ  D:  08/25/2010  T:  08/26/2010  Job:  [id number]  cc:   Dr. Lovell Sheehan  Dr. Juanetta Gosling  Electronically Signed by Lionel December M.D. on 09/19/2010 05:29:11 PM

## 2010-09-19 NOTE — Assessment & Plan Note (Signed)
 HEALTHCARE                         ELECTROPHYSIOLOGY OFFICE NOTE   NAME:GILLIEIzza, Bickle                  MRN:          [id number]  DATE:02/04/2008                            DOB:          1925/12/17    Ms. Walen returns today for followup.  She is a very pleasant 77-year-  old woman with a history of symptomatic bradycardia who is status post  permanent pacemaker insertion.  She returns today for followup.  The  patient has been very inactive since her husband died several years ago  and she admits to some ongoing dietary noncompliance.   MEDICATIONS:  1. Coumadin 5 mg as directed.  2. Diltiazem 240 a day.  3. Metoprolol 50 twice a day.  4. Aspirin 81 mg daily.   PHYSICAL EXAMINATION:  GENERAL:  She is a pleasant elderly woman in no  acute distress.  VITAL SIGNS:  The blood pressure was 110/80, the pulse 68 and regular,  and the respirations were 18.  Weight was 227 pounds.  NECK:  No jugular venous distention.  LUNGS:  Clear bilaterally to auscultation.  No wheezes, rales, or  rhonchi are present.  ABDOMEN:  Soft and nontender.  EXTREMITIES:  No edema.   Interrogation of her pacemaker demonstrates Medtronic Sigma dual-chamber  device.  The battery voltage was 2.77 V.  The ventricular impedance 516,  her atrial impendence was not measured today.  She was in the VVI mode.  Her R-waves were greater than 10 V.  Threshold was 0.5 at 0.5.   IMPRESSION:  1. Symptomatic bradycardia.  2. Atrial fibrillation.  3. Chronic Coumadin therapy.  4. Status post pacemaker insertion.   DISCUSSION:  Overall, Ms. Hinsch is stable.  I have encouraged her to  start walking and becoming more physically active.  She has been fairly  reluctant to do this up until now, but she states that she will consider  this.  [facility]     Doylene Canning. Ladona Ridgel, MD  Electronically Signed    GWT/MedQ  DD: 02/04/2008  DT:  02/05/2008  Job #: [id number]

## 2010-09-20 ENCOUNTER — Encounter: Payer: Medicare Other | Admitting: *Deleted

## 2010-09-20 ENCOUNTER — Encounter: Payer: Self-pay | Admitting: *Deleted

## 2010-09-22 NOTE — Group Therapy Note (Signed)
NAMEORIANA, Cross           ACCOUNT NO.:  [id number]   MEDICAL RECORD NO.:  [id number]          PATIENT TYPE:  INP   LOCATION:  A306                          FACILITY:  [facility]   PHYSICIAN:  Edward L. Juanetta Gosling, M.D.DATE OF BIRTH:  07/08/1925   DATE OF PROCEDURE:  DATE OF DISCHARGE:                                   PROGRESS NOTE   SUBJECTIVE:  [name] says she feels a little better.  She has  cellulitis of the leg of course.  She also felt like she had an episode of  rapid heartbeat.  She does have a history of atrial fibrillation.  Her nurse  also noted that her heart was irregular, although it is regular now.   PHYSICAL EXAMINATION:  VITAL SIGNS:  Her temperature is 97.6, pulse 112,  respirations 20, blood pressure 111/67.  Her O2 sat is 97%.  CHEST:  Clear.  HEART:  Right now seems regular.  MUSCULOSKELETAL:  Her knee looks a little better.  The area of erythema has  decreased in size.  Her chest is clearer.   ASSESSMENT:  She seems to be doing better.   PLAN:  To continue with her medications and treatment, admit her at this  point, and she is going to definitely need outpatient antibiotics.      Edward L. Juanetta Gosling, M.D.  Electronically Signed     ELH/MEDQ  D:  09/05/2005  T:  09/05/2005  Job:  [id number]

## 2010-09-22 NOTE — Discharge Summary (Signed)
[place]. [facility]  Patient:    [name], [name]                  MRN: [id number] Adm. Date:  [id number] Disc. Date: [id number] Attending:  Talitha Givens Dictator:   Abelino Derrick, P.A.C. [facility] CC:         Dr. Damaris Schooner, [address], [place], [place] [address]   Referring Physician Discharge [place]  DISCHARGE DIAGNOSES: 1. Chest pain, negative Cardiolite study this admission. 2. History of nonobstructive coronary disease by catheterization May 2001. 3. Paroxysmal atrial fibrillation. 4. Coumadin therapy. 5. History of permanent transvenous demand ventricular pacemaker.  HOSPITAL COURSE:  Patient is a 77 year old female followed by Dr. Daleen Squibb and Dr. Esmeralda Arthur.  She had PAF in May 2001.  She had near-syncope with pauses when she converted.  She underwent pacemaker.  Also, catheterization showed minimal coronary disease.  She presented August 28, 2000 as a transfer from [facility] ER.  She presented with chest pressure and palpitations.  EKG showed atrial fibrillation and ST changes.  She did have paced beats also.  She was admitted to telemetry.  CPK-MB and troponins were obtained, these were negative.  She was set up for an adenosine Cardiolite study which was done August 29, 2000. This was negative for ischemia with normal LV function.  We fill she can be discharged late on April 25.  Review of her telemetry strips shows that she appeared to be in and out of atrial fibrillation.  When she was in atrial fibrillation she was somewhat fast - about 100 or so.  Subsequent strips show sinus.  Her Cardizem was increased at discharge to 240 mg a day.  She is also on atenolol 25 mg a day; this was recently cut back because of some weakness and dizziness she was having.  She has not been orthostatic in the hospital. She is to resume her Coumadin as taken prior to admission and continue her aspirin 81 mg a day.  LABORATORY DATA:  Sodium 139, potassium  3.7, BUN 14, creatinine 0.7. TSH 3.92.  Portable chest shows no acute abnormalities.  CK and troponin are negative x 2.  Sodium 139, potassium 3.7, BUN 14, creatinine 0.7.  DISPOSITION:  Patient is discharged in stable condition.  FOLLOW-UP:  In [place].  She has an appointment to have her pacemaker checked by Dr. Ladona Ridgel in May and she will keep this.  It seems like her symptoms were related to atrial fibrillation.  I wonder if she has recurrent atrial fibrillation if we might consider an antiarrhythmic such as Norpace; this can be discussed with Dr. Ladona Ridgel and Dr. Daleen Squibb. DD:  08/29/00 TD:  08/30/00 Job: [id number]

## 2010-09-22 NOTE — Assessment & Plan Note (Signed)
[facility]                           ELECTROPHYSIOLOGY OFFICE NOTE   NAME:GILLIEDelisha, Cross                  MRN:          [id number]  DATE:01/17/2006                            DOB:          1925-07-28    [name] was seen in the [place] clinic on January 17, 2006 for  followup of her Medtronic model #303B Sigma.  Date of implant was Sep 13, 1999 for sick sinus syndrome and A fib.  On interrogation of her device  today, her battery voltage is 2.78.  R waves measured 15.68, 22.40 mV with a  ventricular pacing threshold of 0.5 volts at 0.4 ms and a ventricular lead  impedence of 489 ohm.  There were 10 high ventricular rate episodes, the  longest one being 3 seconds long.  No changes were made in her parameters.  She will continue with her monthly telephone checks with a return office  visit in one year's time.                                   [name], LPN                                Doylene Canning. Ladona Ridgel, MD   PO/MedQ  DD:  01/17/2006  DT:  01/17/2006  Job #:  [id number]

## 2010-09-22 NOTE — Procedures (Signed)
NAMEDARNELLE, DERRICK                     ACCOUNT NO.:  [id number]   MEDICAL RECORD NO.:  [id number]                   PATIENT TYPE:  OUT   LOCATION:  RAD                                  FACILITY:  [facility]   PHYSICIAN:  [place] Bing, M.D.               DATE OF BIRTH:  06-May-1926   DATE OF PROCEDURE:  10/13/2003  DATE OF DISCHARGE:                                  ECHOCARDIOGRAM   REFERRING PHYSICIAN:  Edward L. Juanetta Gosling, M.D. and Vida Roller, M.D.   CLINICAL DATA:  A 77 year old woman with edema and prior pacemaker  implantation.   MEDICAL DECISION MAKING:  Aorta 2.7, left atrium 4.6, septum 1.3, posterior  wall 1.1, LV diastole 3.7, LV systole 3.1.   1. Technical suboptimal but adequate echocardiographic study.  2. Moderate right and left atrial enlargement.  Right heart catheter     visualized.  3. Normal right ventricular size; moderate hypertrophy; normal systolic     function.  4. Normal mitral valve; mild annular calcification; very mild regurgitation.  5. Normal tricuspid valve; very mild regurgitation; normal estimated RV     systolic pressure.  6. No significant aortic valve abnormalities.  7. Normal internal dimension of the left ventricle; borderline hypertrophy     with disproportionate septal thickening; slight septal flattening.     Normal regional global LV systolic function.  8. Normal pulmonary artery diameter.  9. Trivial pericardial effusion.  10.      Normal IVC.      ___________________________________________                                            [place] Bing, M.D.   RR/MEDQ  D:  10/14/2003  T:  10/14/2003  Job:  [id number]   cc:   Ramon Dredge L. Juanetta Gosling, M.D.  [address]  [place]  [place] [address]  Fax: [phone]   Vida Roller, M.D.  Fax: [phone]

## 2010-09-22 NOTE — H&P (Signed)
[facility] ADMISSION   NAME:[name], [name]                  MRN:          [id number]  DATE:02/27/2006                            DOB:          1925-05-27    [name] seen today as a new patient by me.   She has history of tachycardia-bradycardia syndrome with pacemaker therapy,  chronic atrial fibrillation.  She has had previous aneurysm repair at  [facility] back in 2004.  Postoperatively, she had pulmonary emboli, and she  has been on chronic Coumadin for her atrial fibrillation and pulmonary  emboli.   She had a true fatigue during this past year.  She was on quite a bit of  medicine for rate control.  Her Lopressor was decreased from 400 mg a day to  200 mg a day.  After a few weeks, she felt better.   She is still on a fairly large dose of Cardizem, and her blood pressure is  low.   REVIEW OF SYSTEMS:  She has multiple constitutional complaints.  She has  chronic paresthesias over the left craniotomy site. She also has some  chronic pain in the right knee.  She fell on it recently and had a strep  infection requiring hospitalization and antibiotics.  She also bruises  easily on Coumadin.   CURRENT MEDICATIONS:  1. Aspirin a day.  2. Cardizem 240 b.i.d.  3. Coumadin as directed.  4. Lasix 40 a day.  5. Potassium 20 a day.  6. Lopressor 100 b.i.d.  7. Iron .   PHYSICAL EXAMINATION:  VITAL SIGNS: Remarkable for blood pressure of 98/60,  pulse 88 and irregular.  SKIN: Multiple bruises, particularly in the left inner arm.  HEENT: Remarkable for left craniotomy and some paresthesias in the superior  fifth trigeminal nerve distribution.  NECK:  There is no thyromegaly.  Carotids are normal.  There is no  lymphadenopathy.  HEART: There are normal heart sounds.  LUNGS:  Clear.  ABDOMEN: Benign.  EXTREMITIES:  Distal pulses are intact with trace edema.   IMPRESSION:  Chronic atrial  fibrillation with pacemaker backup on quite a  bit of AV nodal blocking drugs with some fatigue and decreased blood  pressure.  Her Lopressor has already been cut back.  I will cut her Cardizem  back to 240 once a day.   She will continue to follow up in the Coumadin clinic.   Given her history of PE and atrial fibrillation, she will need to be on  lifelong Coumadin.  The patient did not seem to mind this, and I suspect she  would not be an ideal candidate for any research trial.   She has had normal LV function in the past and catheterization with no  significant coronary artery disease in 2001.   She will follow up back with [place] in about 6 months.  Will further assess her  rate control and blood pressure.            ______________________________  Noralyn Pick. Eden Emms, MD, [facility]      PCN/MedQ  DD:  02/27/2006  DT:  02/27/2006  Job #:  [id number]

## 2010-09-22 NOTE — Group Therapy Note (Signed)
NAMEKELLAN, BOEHLKE           ACCOUNT NO.:  [id number]   MEDICAL RECORD NO.:  [id number]          PATIENT TYPE:  INP   LOCATION:  A306                          FACILITY:  [facility]   PHYSICIAN:  Edward L. Juanetta Gosling, M.D.DATE OF BIRTH:  1925-11-21   DATE OF PROCEDURE:  DATE OF DISCHARGE:                                   PROGRESS NOTE   PROBLEM LIST:  1.  Status post fall with cellulitis of the knee.  2.  Chronic Coumadin therapy.  3.  Hypertension.  4.  Status post pulmonary embolism.   SUBJECTIVE:  Ms. Bokhari says she feels better.  She is inquiring about home.  She has no new complaints.  She has been afebrile.   OBJECTIVE:  VITAL SIGNS:  Her temperature is 98.1, pulse 105, respirations  20, blood pressure 137/73; O2 sat is 97%.  CHEST:  Clear.  HEART:  Regular.   LABORATORY DATA:  White count is 8100, hemoglobin 11.6, platelets of  178,000.  Pro time 18.0 with an INR of 1.5.  Electrolytes:  Her chloride is  113.  Otherwise normal.  Her leg still looks inflamed and irritated.  She  still has some blistering which I think represents staph.  Her culture, thus  far, has no growth.   ASSESSMENT:  I think this is very likely Staphylococcus.  We are working on  getting a PICC line and arrangements made for sending her home.      Edward L. Juanetta Gosling, M.D.  Electronically Signed     ELH/MEDQ  D:  09/06/2005  T:  09/06/2005  Job:  [id number]

## 2010-09-22 NOTE — Cardiovascular Report (Signed)
[place]. [facility]  Patient:    [name], [name]                  MRN: [id number] Proc. Date: 09/11/99 Adm. Date:  [id number] Attending:  Mirian Mo CC:         Damaris Schooner, M.D.             Catheter Lab                        Cardiac Catheterization  PROCEDURE:  Left heart catheterization with coronary angiography and left ventriculography.  INDICATION:  [name] is a 78 year old woman with a history of exertional chest tightness and pressure.  She reportedly had a long history of angina which has een treated medically.  Because of progressive symptoms, she was referred for cardiac catheterization.  She was in atrial fibrillation during her catheterization.  DESCRIPTION OF PROCEDURE:  A 6 French sheath was placed in the right femoral artery.  Standard Judkins 6 French catheters were utilized.  Contrast was Omnipaque.  There were no complications.  RESULTS:  Hemodynamics: 1. Left ventricular pressure 110/90. 2. Aortic pressure 110/76. 3. No aortic valve gradient.  Left ventriculogram:  Mild generalized hypokinesis of the left ventricle. Ejection fraction is calculated at 48%.  It is difficult to be precise with this calculations due to the patients atrial fibrillation and beat to beat variability.  Coronary arteriography (right dominant): 1. Left main normal. 2. Left anterior descending coronary artery has less than 20% stenosis in the    proximal to mid-vessel.  It gives rise to normal first diagonal and small    second diagonal. 3. Left circumflex gives rise to a small ramus intermedius, small OM1, small OM2,    and a large OM3.  The left circumflex is free of angiographic disease. 4. Right coronary artery has less than 20% stenosis at its origin.  It gives rise    to a normal size posterior descending artery, small first and second    posterolateral, and a normal third posterolateral branch.  IMPRESSION: 1. Mild  decreased left ventricular systolic function. 2. Angiographically normal coronary artery with only minor irregularities    as described. DD:  09/11/99 TD:  09/12/99 Job: [id number]

## 2010-09-22 NOTE — H&P (Signed)
NAMEKRISNA, [name]           ACCOUNT NO.:  [id number]   MEDICAL RECORD NO.:  [id number]          PATIENT TYPE:  OBV   LOCATION:  A306                          FAC[facility]:  [facility]   PHYSICIAN:  Edward L. Juanetta Gosling, M.D.DATE OF BIRTH:  [birth year]   DATE OF ADMISSION:  09/04/2005  DATE OF DISCHARGE:  LH                                HISTORY & PHYSICAL   REASON FOR ADMISSION:  Cellulitis of the leg.   HISTORY OF PRESENT ILLNESS:  [name] is a 77 year old who says she was in  her usual state of fairly poor health at home.  She was feeling better in  the last several months though and has been doing a lot of work around her  home.  She fell on August 30, 2005, hurt her right knee.  She came to the  emergency room on September 01, 2005, was evaluated and felt to have bruising of  the knee but no other acute injury and had x-rays done etc.  She then  developed marked erythema of the knee today and came to the emergency room  again and shows cellulitis of the knee with some blistering on the knee and  she is brought in initially for observation to start vancomycin, and I will  see how she does.  She may need a full admission at least for a short period  to get her started on the vancomycin try to get improved.  She lives at home  alone.   PAST MEDICAL HISTORY:  Her past medical history is positive for a cerebral  aneurysm, angina pectoris, history of DVT with pulmonary emboli,  hyperlipidemia, coronary artery occlusive disease, previous pacemaker and  hypertension.  She says at home she has not had any fever or chills.  She  has not had any nausea or vomiting but her leg is more swollen and more of a  problem.  She has not been taking any medications for this because at the  time that she was originally evaluated it was not felt that she needed any  medication.   SURGICAL HISTORY:  She had the aneurysm repaired.   SOCIAL HISTORY:  She does not smoke.  She does not drink any alcohol.  She  does not use any illicit drugs.  She is a widow.   FAMILY HISTORY:  Her family history is very positive for coronary disease  and for hypertension.  '   CURRENT MEDICATIONS:  Her current medications at home are:  1.  Coumadin 5 mg daily.  2.  Diltiazem 240 mg b.i.d.  3.  Metoprolol 100 mg b.i.d.  4.  Aspirin 81 mg daily.  5.  Lasix 40 mg daily.  6.  Potassium chloride 20 mEq daily.   PHYSICAL EXAMINATION:  GENERAL:  Her physical exam shows a well-developed,  well-nourished female who is in no acute distress.  She is awake and alert.  VITAL SIGNS:  Her temperature 98.5, pulse 112, respirations 20, blood  pressure 124/66, O2 saturation is 97% on room air.  Height 69 inches, weight  228.7.  HEENT:  Her pupils are reactive to light and  accommodation.  Her tympanic  membranes were intact.  Extraocular muscles are intact.  NECK:  Supple without masses.  Her throat is clear.  Her neck supple.  She  does not have any jugular venous distension.  CHEST:  Her chest is clear.  HEART:  Her heart is regular.  I do not hear a gallop now.  I do not hear a  murmur now.  ABDOMEN:  Her abdomen is soft without masses.  EXTREMITIES:  The 'right lower extremity shows that on her knees.  She has  three blistered areas with marked erythema with some blood in them. They  were about 1.5 cm each.  She also has bruising from the knee all the way up  the thigh in the medial and posterior surfaces.  She has less bruising in  the lower part of her leg.  Left leg shows a.  scarred area where she had a  previous fall.   LABORATORY DATA:  White count 8500, hemoglobin is 13.1, platelets 207,000.  Prothrombin time 20.2 with an INR of 1.7.  Electrolytes are normal.  Glucose  100.  She did receive a tetanus shot as well.   ASSESSMENT:  She has what looks like cellulitis of the leg in a leg that was  injured.  She also has multiple other medical problems.  Blood pressure  seems pretty well controlled.  I am going to  leave her on her current  medications for that.  Continue with Coumadin per pharmacy protocol.  Start  her on vancomycin because of concern that this might represent a staph and  decide what to do after I look at her leg again in the morning.      Edward L. Juanetta Gosling, M.D.  Electronically Signed     ELH/MEDQ  D:  09/04/2005  T:  09/04/2005  Job:  [id number]

## 2010-09-22 NOTE — Op Note (Signed)
NAMEAVEREE, HARB                     ACCOUNT NO.:  [id number]   MEDICAL RECORD NO.:  [id number]                   PATIENT TYPE:  AMB   LOCATION:  DAY                                  FACILITY:  [facility]   PHYSICIAN:  Lionel December, M.D.                 DATE OF BIRTH:  [birth year]   DATE OF PROCEDURE:  11/12/2002  DATE OF DISCHARGE:                                 OPERATIVE REPORT   PROCEDURE:  Total colonoscopy.   INDICATION:  Ms. Giovannetti is a 77 year old, Caucasian female with recent  change in her bowel habits.  She also has intermittent hematochezia,  possibly secondary to hemorrhoids.  She is undergoing diagnostic  colonoscopy.   Procedure is reviewed with the patient.  Informed consent was obtained.   PREOPERATIVE MEDICATIONS:  1. Demerol 25 mg IV.  2. Versed 4 mg IV in divided dose.   INSTRUMENT:  Olympus video system.   FINDINGS:  Procedure performed in endoscopy suite.  The patient's vital  signs and O2 saturations were monitored during the procedure and remained  stable.  The patient was placed in left lateral decubitus position.  Rectal  examination performed.  She very large, soft external skin tags.  Digital  exam was normal.  Scope was placed in rectum and advanced under vision in  the sigmoid colon.  Had multiple diverticula noticed, some of which were  quite large.  She had formed bits and pieces of stool in her sigmoid and  descending colon.  Overall, preparation was felt to be satisfactory.  There  were a few more diverticula at descending and a few more in transverse  colon.  Scope was passed into cecum which was identified by appendiceal  orifice and ileocecal valve.  Pictures taken for the record.  As the scope  was withdrawn, colonic mucosa was once again carefully examined and was  normal.  Rectal mucosa was also normal.  Scope was retroflexed to examine  anorectum junction, and she had prominent hemorrhoids above the dentate  line.  Endoscope was  straightened and withdrawn.  The patient tolerated the  procedure well.   FINAL DIAGNOSES:  1. Pancolonic diverticulosis.  Most of the diverticula, however, were     concentrated in the sigmoid colon.  2. Large external skin tags and external hemorrhoids.  3. I feel that her irregular bowel movements are secondary to irritable     bowel syndrome.   RECOMMENDATIONS:  1. She will resume her usual medications.  She needs to stay on Metamucil 1     tbsp. Full daily.  2.     She can continue using Mycolog-II cream to her anal area p.r.n. for the next     week or so.  3. She will resume her Coumadin and will stay on Lovenox for another 2-3     days, as recommended by Maisie Fus C. Wall, M.D.  Lionel December, M.D.    NR/MEDQ  D:  11/12/2002  T:  11/12/2002  Job:  [id number]   cc:   Ramon Dredge L. Juanetta Gosling, M.D.  [address]  [place]  [place] [address]  Fax: [phone]   Jesse Sans. Wall, M.D.

## 2010-09-22 NOTE — Op Note (Signed)
[place]. [facility]  Patient:    [name], [name]                  MRN: [id number] Proc. Date: 09/13/99 Adm. Date:  [id number] Disc. Date: [id number] Attending:  Mirian Mo CC:         Tyler Deis, M.D.             Thomas C. Wall, M.D. [facility]             Bruce R. Juanda Chance, M.D. [facility]             Cardiopulmonary Lab                           Operative Report  INDICATIONS:  [name] is 77 years old and a has a history of intermittent atrial fibrillation.  She was admitted with chest pain and underwent catheterization, which showed nonobstructive coronary disease.  While in the hospital she was in and out of atrial fibrillation, and when she converted she had long pauses of greater than four seconds, associated with presyncope.  The decision was made to replace the permanent pacemaker.  She also had periods of rapid atrial fibrillation when she was in atrial fibrillation.  PROCEDURE:  Implantation of a Medtronic Sigma DDR generator (Model T4773870, Serial No. [id number]).  With a Medtronic bipolar screw-in ventricular lead (Model 5076 52 cm; Serial No. [id number]).  Also a Medtronic bipolar screw-in atrial lead (Model 5076 45 cm; Serial No. [id number]).  INDICATIONS FOR PROCEDURE:  Sick sinus syndrome with bradycardia and tachycardia.  ESTIMATED BLOOD LOSS:  Less than 15 cc.  COMPLICATIONS:  None.  PROCEDURAL NOTE:  The procedure was performed in laboratory room #6.  The right anterior chest was prepped and draped in the usual sterile fashion. Skin and subcutaneous tissue were anesthetized with 1% local lidocaine.  A venogram was done prior to access.  Using an 18-gauge thin-walled needle, the subclavian vein was entered and access was secured with a 0.038 wire.  An incision was made below the clavicle and extended to the prepectoral fascia. The pocket was made inferior to the incision using blunt dissection.  Using 2-French and 9-French sheaths,  the atrial and ventricular leads were passed through the right atrium.  A 0.038 wire was left in the subclavian vein for later access.  A figure-of-eight suture was placed at the entry site for hemostasis and for later securing of the leads.  The ventricular lead was positioned in the right ventricular apex, with good pacing parameters as described below.  The atrial lead was screwed into the right atrial appendage, with good pacing parameters as described below.  After removal of the stylettes and a 0.038 wire, the figure-of-eight suture was secured at the entry site.  The leads were attached to the prepectoral fascia with two sutures of 1-0 silk and running Silastic collar.  The pocket was irrigated with sterile kanamycin solution.  The leads were attached to the generator with a hex nut wrench.  The generator was implanted into the pocket and secured loosely to the prepectoral fascia.  The subcutaneous tissue was closed with running 2-0 Dexon.  The skin was closed with running with 5-0 Dexon.  PACING PARAMETERS: 1. Ventricular unipolar:  Minimum pressure R-wave 7.5 mV.  Minimum threshold    capture 0.5 V.  Resistance 1372 ohms. 2. Ventricular bipolar:  R-wave 8.8 mV.  Minimum threshold capture 0.8 V.  Resistance 1494 ohms. 3. Atrial unipolar:  P-wave 3.3 mV.  Minimum threshold capture 1.3 V.    Resistance 572 ohms. 4. Atrial bipolar:  P-wave 5.5 mV.  Minimum threshold capture 1.4 V.    Resistance 628 ohms. There was no pacing on the diaphragm in either the unipolar or bipolar lead at 10 V.  DISPOSITION:  The patient left the procedure well and left the laboratory in satisfactory condition.DD:  09/13/99 TD:  09/14/99 Job: [id number]

## 2010-09-22 NOTE — Consult Note (Signed)
NAMELEAFY, MOTSINGER                       ACCOUNT NO.:  [id number]   MEDICAL RECORD NO.:  [id number]                  PATIENT TYPE:   LOCATION:                                       FACILITY:   PHYSICIAN:  Lionel December, M.D.                 DATE OF BIRTH:  02-Mar-1926   DATE OF CONSULTATION:  10/12/2002  DATE OF DISCHARGE:                                   CONSULTATION   REASON FOR CONSULTATION:  Change in bowel habits.   HISTORY OF PRESENT ILLNESS:  Ms. Amodei is a 77 year old Caucasian female  referred by Dr. Juanetta Gosling for evaluation of change in her bowel habits.  She  recalls that she had constipation while she was working.  She feels this is  related to her job, and after she retired, she apparently had no problems  with her bowels.  However, for the last year or so, she has had intermittent  diarrhea with bowel urgency and has had few accidents.  These symptoms have  gotten worse in the last two to three months.  She tells me she had three  loose stools yesterday, and she has already had three this morning.  She  also has noted rectal discharge after she has had a bowel movement.  She has  occasional hematochezia which she believes is from the hemorrhoids, and she  also has some local discomfort.  About a year ago, she had an episode when  she had severe abdominal pain with swelling.  She was seen in the emergency  room, and tests reportedly were all negative.  She has very good appetite.  She has gain 8 or 10 pounds this year.  She denies nausea, vomiting,  heartburn, or dysphagia.  She is having some problem chewing her food and is  being evaluated by her dentist.  She denies melena, fever, chills.  She does  complain of numbness along the left side of her face.  This actually has  gotten better.  She is scheduled to see her neurosurgeon some time in July.   CURRENT MEDICATIONS:  1. Lasix 40 mg q.a.m.  2. Metoprolol 100 mg b.i.d.  3. Potassium 20 mEq daily.  4.  Coumadin 5 mg daily.  5. Cardizem 240 mg daily.  6. Colace p.r.n.   She is also complaining of edema which has not gotten better with Lasix.  She has an appointment to see Dr. Daleen Squibb this week, and they can address her  diuretic therapy separately.   PAST MEDICAL HISTORY:  She was diagnosed with hypothyroidism back in 1960,  treated with medications, and she has remained in remission.  She recalled  that she lost a lot of weight at that time.  Her last TSH was reportedly  normal.  She has history of arrhythmia.  She had permanent pacemaker placed  in 2001.  She has had bilateral cataract surgery.  The right one was done in  May 2003, and the left one was done in August 2003.  She still could not see  well.  She eventually was found to have aneurysm.  She had surgery at [facility] in February of this year.  A few days after  discharge, she developed DVT and pulmonary embolus and was hospitalized.  She is on Coumadin, but she had been on even prior to her brain surgery,  presumably for history of SVT or atrial fibrillation.  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   ALLERGIES:  PHENERGAN and CODEINE.   FAMILY HISTORY:  Mother lived to be 79 and died of MI.  Father was a chain  smoker and died of MI at age 44.  She lost one sister of MI at age 55, and  she was also a chain smoker.  One sister has Alzheimer's.  She is 52 years  old.   SOCIAL HISTORY:  She is married but does not have any children.  She is  retired from [employer].  She smoked cigarettes for 20 years, no more  than a pack a day, but she quit 20 years ago.  She does not drink alcohol.   PHYSICAL EXAMINATION:  GENERAL:  Pleasant, mildly obese, Caucasian female  who was in no acute distress.  VITAL SIGNS:  She weighs 212-1/2 pounds.  She is 5 feet 9-1/2 inches tall.  Pulse 106 per minute and regular, blood pressure 110/80, temperature 97.3.   HEENT:  Conjunctivae pink.  Sclerae is nonicteric.  Pupils appear to be  equal and reactive.  Oropharyngeal mucosa is normal.  Dentition is in  satisfactory condition.  NECK:  Without masses or thyromegaly.  JVD does not appear to be elevated.  CARDIAC:  Irregular rhythm, normal S1 and S3.  No murmur or gallop noted.  LUNGS:  Clear to auscultation.  ABDOMEN:  Full.  Bowel sounds are normal to palpation.  Soft abdomen without  tenderness, organomegaly, or masses.  RECTAL:  Large, soft skin tags.  Initial exam is normal, and stool is guaiac  negative.  EXTREMITIES:  She has 1+ pitting edema involving both feet and legs.  She  does not have clubbing.   ASSESSMENT:  Ms. Chaney is a 77 year old Caucasian female who presents with  intermittent diarrhea, bowel urgency, and has had a few episodes where she  could not even make it to the bathroom.  She also has hematochezia felt to  be due to hemorrhoids.  I suspect we are dealing with irritable bowel  syndrome.  With her history, we need to make sure she does not have low-  grade colitis or a large polyp.  Her Coumadin will have to be put on hold  for four days, or she may have to be bridged with Lovenox, but will  determine this after she has seen Dr. Daleen Squibb.   RECOMMENDATIONS:  1. I would like her to go back on Metamucil or equivalent 1 tablespoon at     night.  2. Mycolog cream to be applied to perianal area on p.r.n. basis.  3. Total colonoscopy to be performed the first week in July.  4.     Coumadin will be held for four days.  Need for Lovenox bridge will be     determined after we have talked with Dr. Daleen Squibb.   I would like to thank Dr. Juanetta Gosling for the opportunity to take part in the  care of this nice lady.                                               Lionel December, M.D.    NR/MEDQ  D:  10/12/2002  T:  10/12/2002  Job:  [id number]   cc:   Ramon Dredge L. Juanetta Gosling, M.D. [address]  [place]  [place] [address]  Fax: [phone]   Jesse Sans.  Wall, M.D.

## 2010-09-22 NOTE — Discharge Summary (Signed)
NAMEMARZETTA, [name]           ACCOUNT NO.:  [id number]   MEDICAL RECORD NO.:  [id number]          PATIENT TYPE:  INP   LOCATION:  A306                          FACILITY:  [facility]   PHYSICIAN:  Edward L. Juanetta Gosling, M.D.DATE OF BIRTH:  1925/07/10   DATE OF ADMISSION:  09/04/2005  DATE OF DISCHARGE:  05/03/2007LH                                 DISCHARGE SUMMARY   FINAL DISCHARGE DIAGNOSIS:  1.  Cellulitis of the right knee.  2.  Multiple small marked contusions of the right knee.  3.  History of cerebral aneurysm.  4.  Angina pectoris.  5.  History of deep venous thrombosis with pulmonary emboli.  6.  Hyperlipidemia.  7.  Coronary artery occlusive disease.  8.  Status post pacemaker.  9.  Hypertension.  10. Atrial fibrillation.   This is a 77 year old who has been in her usual state of fairly poor health  at home.  She had been doing some work around her home and she fell on April  26 and hurt her right knee.  She was seen on April 28.  She had bruising of  the knee, she is on chronic Coumadin therapy, and it was felt that she did  not have any fractures.  On the day of admission, she developed marked  erythema of the knee, some blistering on the knee, and she appeared to have  cellulitis.  She was admitted for vancomycin.   Exam showed a well-developed, well-nourished female in no acute distress,  awake and alert.  Temperature 98.5, pulse 112, respirations 20, blood  pressure 124/66, O2 sat 97% on room air. Her significant findings were that  her heart was regular without a gallop at that point. She did have some  episodes of atrial fibrillation which are chronic which is why she has a  pacemaker and is on Coumadin.  Right lower extremity showed that she had  three blistered areas with marked erythema and blood in the blisters about  1.5 cm each, bruising from the knee all the way up to the thigh on the  medial and posterior surfaces with less bruising of the lower part of her  leg, scarred area where she had a previous fall on her left leg.   HOSPITAL COURSE:  She was started on vancomycin.  It was felt that this was  very likely Staph.  She improved, was able to get up and move around some,  wanted to go home.  We arranged a PICC line and are arranging outpatient  vancomycin pending her wound culture.  Thus far, the wound culture is  negative, but we will go ahead and set up outpatient vancomycin until we  know for sure.  She is going to continue her other medications at home.  She  will need home health PT, she will need dressing changes.   Her medications at home are Coumadin 5 mg daily, diltiazem 240 mg b.i.d.,  metoprolol 100 mg b.i.d., aspirin 81 mg daily, Lasix 40 mg daily, potassium  chloride 20 mEq daily.      Edward L. Juanetta Gosling, M.D.  Electronically Signed  ELH/MEDQ  D:  09/06/2005  T:  09/06/2005  Job:  [id number]

## 2010-09-22 NOTE — Discharge Summary (Signed)
[place]. [facility]  Patient:    [name], [name]                  MRN: [id number] Adm. Date:  [id number] Disc. Date: [id number] Attending:  Mirian Mo Dictator:   Lavella Hammock, P.A. CC:         Rudene Christians. Ladona Ridgel, M.D. [facility]             Thomas C. Wall, M.D. [facility]             Dr. Damaris Schooner in [place], [place].                  Referring Physician Discharge [facility]  DATE OF BIRTH:  27-Oct-1925  PROCEDURES: 1. Cardiac catheterization. 2. Coronary arteriogram. 3. Left ventriculogram. 4. Echocardiogram. 5. Insertion of a permanent pacemaker.  HOSPITAL COURSE:  [name] is a 77 year old female who was told in 1992 that she had coronary artery disease but never had a cardiac catheterization. She was seen in the office on Sep 08, 1999 for chronic angina which, by her history, was getting worse.  She also noted palpitations and an irregular heart beat that had been going on for years.  She had a cholesterol check which showed an LDL of 146 with a total cholesterol of 227, and her EKG done at that time showed atrial fibrillation with a rate of 120.  Because of her chest pain, her arrhythmia, and her cardiac risk factors, she was admitted to [facility] for further evaluation and catheterization.  She had a catheterization on Monday, Sep 11, 1999 and it showed 20% LAD, approximately 20% RCA, and other coronary arteries were normal.  There was some mild generalized hypokinesis with an EF calculated at 48%, although it was difficult to do because of her arrhythmia.  She also had a 2-D echo which showed a left ventricle that was grossly normal in size and an ejection fraction that was normal.  An interior echo-free space was noted, which probably represents a fat pad.  There was also a trivial posterior pericardial effusion.  There was trace MR and a mildly dilated left atrium.  The right atrium was normal.  She had been on atenolol 50 mg a day  and this had been increased to 50 mg b.i.d. and she was tolerating this well.  Because her heart rate was generally running greater than 100, she had Cardizem added to her medication regimen.  Her Cardizem was increased to try to control her heart rate, but on Sep 12, 1999 she had a presyncopal episode.  Her blood pressure was 100/70 and her telemetry showed sequential 4.0 second pauses.  There were shorter pauses as well.  Her beta blockers and diltiazem were held.  She had had one dose of Coumadin after her cardiac catheterization but her INR was still subtherapeutic and she was evaluated for possible pacemaker.  On Sep 13, 1999 she admitted that she had been having presyncopal spells for a long time but had not told anybody about them until now.  On Sep 13, 1999 she had a Medtronic ______ permanent pacemaker in DDD mode inserted in the right subclavicular area by Dr. Juanda Chance.  Her Coumadin was held and was to be restarted again in four days.  She tolerated the procedure well and the next day she had a chest x-ray which showed no pneumothorax, although a little subsegmental atelectasis in the left base, and she was seen by cardiac  rehab. She tolerated ambulation very well.  She stated that she had reached for something with her right arm and after she did that she had some oozing at the incision site for the pacemaker, but it was felt that this was not a pacemaker pocket hematoma, but it was some surface oozing and was controlled by direct pressure.  She had a slight amount of swelling in the area but otherwise the incision looked good.  With the pacemaker functioning well and with no problem with ambulation, and the patient agreeing to motion and driving restrictions as given to her, she was considered stable for discharge on Sep 14, 1999.  LABORATORY VALUES:  Hemoglobin 14.6, hematocrit 41.5, wbcs 6.5, platelets 173.  INR 1.2 at discharge.  Sodium 137, potassium 4.3, chloride 105, CO2  25, BUN 14, creatinine 0.7, glucose 102.  Chest x-ray - No pneumothorax, status post permanent pacemaker dual lead insertion with subsegmental atelectasis at the left base.  DISCHARGE CONDITION:  Improved.  CONSULTS:  None.  COMPLICATIONS:  None.  DISCHARGE DIAGNOSES: 1. Chest pain, no significant coronary artery disease by catheterization.  She    is to follow up with her primary care physician. 2. Presyncope secondary to pauses, status post permanent pacemaker. 3. Atrial fibrillation, rate well-controlled at this time. 4. Anticoagulation, patient is to start Coumadin on Monday, Sep 18, 1999. 5. Slight hemorrhage at the incision site.  Patient was strongly advised to    limit her activity as she had been instructed in no driving for seven    days.  She is to call the office immediately for bleeding, swelling, or    any kind of drainage at the incision site or at the catheterization    site. 6. Hyperlipidemia, continue Baycol. 7. Obesity. 8. Sleep apnea by history. 9. Preserved left ventricular function.  DISCHARGE INSTRUCTIONS:  ACTIVITY:  Her activity is limited, and she is to gradually increase her right arm motion as instructed.  She is to do no driving for seven days.  DIET:  She is to stick to a low fat diet.  SPECIAL INSTRUCTIONS:  She is to call the office for bleeding, swelling, or drainage any of her incision sites.  She is to get a Coumadin test on Thursday at 9:30 a.m. in the [place] office.  FOLLOW-UP:  She has a P.A. appointment on May 23 at 12:15 p.m. for wound check.  She is to follow-up with Dr. Esmeralda Arthur as needed.  She is to follow up with Dr. Daleen Squibb in two to three months.  DISCHARGE MEDICATIONS: 1. Coated aspirin 81 mg q.d. 2. Baycol 0.4 mg q.d. 3. Coumadin 5 mg q.d., start Monday. 4. Tenormin 50 mg b.i.d. DD:  09/14/99 TD:  09/14/99 Job: [id number]

## 2010-09-22 NOTE — Procedures (Signed)
[facility]  Patient:    CAMELLIA, POPESCU Visit Number: [id number] MRN: [id number]          Service Type: EMS Location: ED Attending Physician:  Rosalyn Charters Dictated by:   [place] Bing, M.D. Proc. Date: 11/25/01 Admit Date:  10/06/2001 Discharge Date: 10/06/2001                                Stress Test  REFERRING PHYSICIAN:  Kari Baars, M.D.  CLINICAL DATA:  A 77 year old woman with noncritical coronary disease on prior catheterization in May 2001.  Subsequent admission with chest pain and negative Cardiolite.  Sick sinus syndrome with constant atrial fibrillation and previous pacemaker implantation.  1. Upright treadmill exercise performed to a workload of 5 METS and a heart    rate of 140, 95% of age predicted maximum.  Exercise discontinued due to    chest tightness and dyspnea.  No significant arrhythmias noted.  Occasional    PVCs.  Blood pressure increased from a resting value of 110/86 to peak of    140/70, a fairly flat response.  Baseline EKG:  Atrial fibrillation with controlled ventricular response, right ventricular conduction delay, shallow T-wave inversion in leads V2-V3, rightward axis.  Exercise EKG:  QRS axis shifted further rightward.  Otherwise, no significant change.  IMPRESSION:  Abnormal graded exercise test with markedly impaired exercise capacity, exercise induced chest discomfort, a somewhat exaggerated increase in heart rate at low level exercise and no electrocardiographic evidence for myocardial ischemia. Dictated by:   McAdoo Bing, M.D. Attending Physician:  Rosalyn Charters DD:  11/28/01 TD:  12/01/01 Job: [id number]

## 2010-09-23 NOTE — H&P (Signed)
  [name], [name]           ACCOUNT NO.:  [id number]  MEDICAL RECORD NO.:  [id number]           PATIENT TYPE:  I  LOCATION:  S122                          FACILITY:  [facility]  PHYSICIAN:  Tarence Searcy L. Juanetta Gosling, M.D.DATE OF BIRTH:  08/06/1925  DATE OF ADMISSION:  09/05/2010 DATE OF DISCHARGE:  LH                             HISTORY & PHYSICAL   HISTORY OF PRESENT ILLNESS:  [name] is admitted to the [facility] with the need for rehabilitation following a episode of sepsis from her gallbladder.  She eventually underwent cholecystectomy.  She had to have prolonged ventilator support and she was quite weak and is undergoing rehabilitation.  She says she is doing better.  She has no new complaints.  In addition to the problems already mentioned, she has chronic atrial fibrillation on chronic Coumadin.  She has coronary artery occlusive disease.  She has had pulmonary emboli.  She has COPD which is moderately severe.  SOCIAL HISTORY:  She is a nonsmoker.  She is a widow.  She has been living at home alone.  FAMILY HISTORY:  Noncontributory.  REVIEW OF SYSTEMS:  Except as mentioned is negative.  PHYSICAL EXAMINATION:  GENERAL:  She is awake and alert.  She looks pretty comfortable. HEART:  Irregular without murmur, gallop, or rub. CHEST:  Much clearer. ABDOMEN:  Soft. EXTREMITIES:  No edema.  She looks better in general.  ASSESSMENT:  She is improving.  PLAN:  To continue with a rehab, continue her meds, and I will see her back once she gets out of the rehabilitation facility.  I will follow her in my office and I will follow her at the rehab facility until she is discharged.     [name] L. Juanetta Gosling, M.D.     ELH/MEDQ  D:  09/17/2010  T:  09/17/2010  Job:  [id number]  Electronically Signed by Kari Baars M.D. on 09/23/2010 09:39:17 AM

## 2010-10-09 ENCOUNTER — Ambulatory Visit (INDEPENDENT_AMBULATORY_CARE_PROVIDER_SITE_OTHER): Payer: Medicare Other | Admitting: *Deleted

## 2010-10-09 DIAGNOSIS — Z7901 Long term (current) use of anticoagulants: Secondary | ICD-10-CM

## 2010-10-09 DIAGNOSIS — I4891 Unspecified atrial fibrillation: Secondary | ICD-10-CM

## 2010-10-09 DIAGNOSIS — I2699 Other pulmonary embolism without acute cor pulmonale: Secondary | ICD-10-CM

## 2010-10-09 DIAGNOSIS — I82409 Acute embolism and thrombosis of unspecified deep veins of unspecified lower extremity: Secondary | ICD-10-CM

## 2010-10-09 LAB — POCT INR: INR: 3.5

## 2010-10-23 ENCOUNTER — Ambulatory Visit (INDEPENDENT_AMBULATORY_CARE_PROVIDER_SITE_OTHER): Payer: Medicare Other | Admitting: *Deleted

## 2010-10-23 DIAGNOSIS — I4891 Unspecified atrial fibrillation: Secondary | ICD-10-CM

## 2010-10-23 DIAGNOSIS — I2699 Other pulmonary embolism without acute cor pulmonale: Secondary | ICD-10-CM

## 2010-10-23 DIAGNOSIS — Z7901 Long term (current) use of anticoagulants: Secondary | ICD-10-CM

## 2010-10-23 DIAGNOSIS — I82409 Acute embolism and thrombosis of unspecified deep veins of unspecified lower extremity: Secondary | ICD-10-CM

## 2010-10-23 LAB — POCT INR: INR: 3.5

## 2010-11-06 ENCOUNTER — Ambulatory Visit (INDEPENDENT_AMBULATORY_CARE_PROVIDER_SITE_OTHER): Payer: Medicare Other | Admitting: *Deleted

## 2010-11-06 DIAGNOSIS — I82409 Acute embolism and thrombosis of unspecified deep veins of unspecified lower extremity: Secondary | ICD-10-CM

## 2010-11-06 DIAGNOSIS — I2699 Other pulmonary embolism without acute cor pulmonale: Secondary | ICD-10-CM

## 2010-11-06 DIAGNOSIS — Z7901 Long term (current) use of anticoagulants: Secondary | ICD-10-CM

## 2010-11-06 DIAGNOSIS — I4891 Unspecified atrial fibrillation: Secondary | ICD-10-CM

## 2010-11-06 LAB — POCT INR: INR: 2.8

## 2010-11-10 ENCOUNTER — Encounter: Payer: Self-pay | Admitting: Internal Medicine

## 2010-11-10 DIAGNOSIS — I495 Sick sinus syndrome: Secondary | ICD-10-CM

## 2010-11-13 ENCOUNTER — Other Ambulatory Visit: Payer: Self-pay | Admitting: Adult Health

## 2010-11-13 NOTE — Telephone Encounter (Signed)
.   Requested Prescriptions   Pending Prescriptions Disp Refills  . digoxin (LANOXIN) 0.125 MG tablet      Sig: Take 1 tablet (125 mcg total) by mouth daily. Take 1/2 tablet daily   Recalll appt for Rosette Reveal @ 03/08/11.

## 2010-11-14 ENCOUNTER — Other Ambulatory Visit: Payer: Self-pay

## 2010-11-14 MED ORDER — DIGOXIN 0.0625 MG HALF TABLET: 0.0625 mg | ORAL_TABLET | Freq: Every day | ORAL | Status: DC

## 2010-12-04 ENCOUNTER — Ambulatory Visit (INDEPENDENT_AMBULATORY_CARE_PROVIDER_SITE_OTHER): Payer: Medicare Other | Admitting: *Deleted

## 2010-12-04 DIAGNOSIS — I2699 Other pulmonary embolism without acute cor pulmonale: Secondary | ICD-10-CM

## 2010-12-04 DIAGNOSIS — I82409 Acute embolism and thrombosis of unspecified deep veins of unspecified lower extremity: Secondary | ICD-10-CM

## 2010-12-04 DIAGNOSIS — I4891 Unspecified atrial fibrillation: Secondary | ICD-10-CM

## 2010-12-04 DIAGNOSIS — Z7901 Long term (current) use of anticoagulants: Secondary | ICD-10-CM

## 2010-12-04 LAB — POCT INR: INR: 1.8

## 2011-01-01 ENCOUNTER — Ambulatory Visit (INDEPENDENT_AMBULATORY_CARE_PROVIDER_SITE_OTHER): Payer: Medicare Other | Admitting: *Deleted

## 2011-01-01 DIAGNOSIS — I82409 Acute embolism and thrombosis of unspecified deep veins of unspecified lower extremity: Secondary | ICD-10-CM

## 2011-01-01 DIAGNOSIS — I2699 Other pulmonary embolism without acute cor pulmonale: Secondary | ICD-10-CM

## 2011-01-01 DIAGNOSIS — Z7901 Long term (current) use of anticoagulants: Secondary | ICD-10-CM

## 2011-01-01 DIAGNOSIS — I4891 Unspecified atrial fibrillation: Secondary | ICD-10-CM

## 2011-01-01 LAB — POCT INR: INR: 2.3

## 2011-01-05 ENCOUNTER — Encounter: Payer: Self-pay | Admitting: *Deleted

## 2011-01-11 ENCOUNTER — Telehealth: Payer: Self-pay | Admitting: Internal Medicine

## 2011-01-11 NOTE — Telephone Encounter (Signed)
Spoke with pt. Pt is confused about when she should send her next transmission. Record is not clear. Will call [employer] tomorrow. To figure out.

## 2011-01-11 NOTE — Telephone Encounter (Signed)
Pt calling to set up remote device check. Pt seems confused as to what appt she needs and when. Pt said she received a letter in the mail stating she is past due for her device check and that is why she is calling to get appt. Please return pt call to discuss.

## 2011-01-29 ENCOUNTER — Encounter: Payer: Medicare Other | Admitting: *Deleted

## 2011-01-31 ENCOUNTER — Ambulatory Visit (INDEPENDENT_AMBULATORY_CARE_PROVIDER_SITE_OTHER): Payer: Medicare Other | Admitting: *Deleted

## 2011-01-31 DIAGNOSIS — I4891 Unspecified atrial fibrillation: Secondary | ICD-10-CM

## 2011-01-31 DIAGNOSIS — I82409 Acute embolism and thrombosis of unspecified deep veins of unspecified lower extremity: Secondary | ICD-10-CM

## 2011-01-31 DIAGNOSIS — I2699 Other pulmonary embolism without acute cor pulmonale: Secondary | ICD-10-CM

## 2011-01-31 DIAGNOSIS — Z7901 Long term (current) use of anticoagulants: Secondary | ICD-10-CM

## 2011-01-31 LAB — POCT INR: INR: 1.4

## 2011-02-09 ENCOUNTER — Encounter: Payer: Self-pay | Admitting: Internal Medicine

## 2011-02-09 DIAGNOSIS — I495 Sick sinus syndrome: Secondary | ICD-10-CM

## 2011-02-19 ENCOUNTER — Ambulatory Visit (INDEPENDENT_AMBULATORY_CARE_PROVIDER_SITE_OTHER): Payer: Medicare Other | Admitting: *Deleted

## 2011-02-19 DIAGNOSIS — I2699 Other pulmonary embolism without acute cor pulmonale: Secondary | ICD-10-CM

## 2011-02-19 DIAGNOSIS — I82409 Acute embolism and thrombosis of unspecified deep veins of unspecified lower extremity: Secondary | ICD-10-CM

## 2011-02-19 DIAGNOSIS — I4891 Unspecified atrial fibrillation: Secondary | ICD-10-CM

## 2011-02-19 DIAGNOSIS — Z7901 Long term (current) use of anticoagulants: Secondary | ICD-10-CM

## 2011-02-19 LAB — POCT INR: INR: 1.7

## 2011-03-07 ENCOUNTER — Ambulatory Visit (INDEPENDENT_AMBULATORY_CARE_PROVIDER_SITE_OTHER): Payer: Medicare Other | Admitting: *Deleted

## 2011-03-07 DIAGNOSIS — I82409 Acute embolism and thrombosis of unspecified deep veins of unspecified lower extremity: Secondary | ICD-10-CM

## 2011-03-07 DIAGNOSIS — I4891 Unspecified atrial fibrillation: Secondary | ICD-10-CM

## 2011-03-07 DIAGNOSIS — I2699 Other pulmonary embolism without acute cor pulmonale: Secondary | ICD-10-CM

## 2011-03-07 DIAGNOSIS — Z7901 Long term (current) use of anticoagulants: Secondary | ICD-10-CM

## 2011-03-07 LAB — POCT INR: INR: 1.7

## 2011-03-28 ENCOUNTER — Encounter: Payer: Medicare Other | Admitting: *Deleted

## 2011-03-28 ENCOUNTER — Ambulatory Visit (INDEPENDENT_AMBULATORY_CARE_PROVIDER_SITE_OTHER): Payer: Medicare Other | Admitting: *Deleted

## 2011-03-28 DIAGNOSIS — I2699 Other pulmonary embolism without acute cor pulmonale: Secondary | ICD-10-CM

## 2011-03-28 DIAGNOSIS — Z7901 Long term (current) use of anticoagulants: Secondary | ICD-10-CM

## 2011-03-28 DIAGNOSIS — I4891 Unspecified atrial fibrillation: Secondary | ICD-10-CM

## 2011-03-28 DIAGNOSIS — I82409 Acute embolism and thrombosis of unspecified deep veins of unspecified lower extremity: Secondary | ICD-10-CM

## 2011-03-28 LAB — POCT INR: INR: 3.8

## 2011-04-12 ENCOUNTER — Ambulatory Visit (INDEPENDENT_AMBULATORY_CARE_PROVIDER_SITE_OTHER): Payer: Medicare Other | Admitting: *Deleted

## 2011-04-12 ENCOUNTER — Ambulatory Visit (INDEPENDENT_AMBULATORY_CARE_PROVIDER_SITE_OTHER): Payer: Medicare Other | Admitting: Internal Medicine

## 2011-04-12 ENCOUNTER — Encounter: Payer: Self-pay | Admitting: *Deleted

## 2011-04-12 DIAGNOSIS — Z7901 Long term (current) use of anticoagulants: Secondary | ICD-10-CM

## 2011-04-12 DIAGNOSIS — I82409 Acute embolism and thrombosis of unspecified deep veins of unspecified lower extremity: Secondary | ICD-10-CM

## 2011-04-12 DIAGNOSIS — I4891 Unspecified atrial fibrillation: Secondary | ICD-10-CM

## 2011-04-12 DIAGNOSIS — I495 Sick sinus syndrome: Secondary | ICD-10-CM

## 2011-04-12 DIAGNOSIS — Z950 Presence of cardiac pacemaker: Secondary | ICD-10-CM

## 2011-04-12 DIAGNOSIS — I2699 Other pulmonary embolism without acute cor pulmonale: Secondary | ICD-10-CM

## 2011-04-12 LAB — PACEMAKER DEVICE OBSERVATION
BATTERY VOLTAGE: 2.74 V
BMOD-0003RV: 30
BMOD-0004RV: 5
BRDY-0002RV: 60 {beats}/min
BRDY-0004RV: 120 {beats}/min
RV LEAD AMPLITUDE: 11.2 mv
RV LEAD IMPEDENCE PM: 472 Ohm
RV LEAD THRESHOLD: 1 V
VENTRICULAR PACING PM: 36.6

## 2011-04-12 LAB — POCT INR: INR: 2.7

## 2011-04-12 NOTE — Assessment & Plan Note (Signed)
Her ventricular rate appears to be well controlled. She will continue her current meds. 

## 2011-04-12 NOTE — Progress Notes (Signed)
HPI [name] returns today for followup. She is a pleasant 77 yo woman with a h/o symptomatic bradycardia s/p PPM, HTN, atrial fibrillation, on rate control, and obesity. She denies c/p or sob. No peripheral edema or syncope. Allergies  Allergen Reactions  . Codeine     REACTION: GI distress  . Penicillins   . Bee Swelling     Current Outpatient Prescriptions  Medication Sig Dispense Refill  . allopurinol (ZYLOPRIM) 300 MG tablet Take 1 tablet by mouth Daily.      Marland Kitchen aspirin (ASPIRIN EC) 81 MG EC tablet Take 81 mg by mouth daily.        . digoxin (LANOXIN) 0.125 MG tablet Take one-half by mouth daily        . diltiazem (CARDIZEM CD) 240 MG 24 hr capsule Take 240 mg by mouth daily.        Marland Kitchen gabapentin (NEURONTIN) 300 MG capsule Take 300 mg by mouth at bedtime.        . metoprolol (TOPROL-XL) 100 MG 24 hr tablet Take 0.5 tablets by mouth Daily.       Marland Kitchen warfarin (COUMADIN) 5 MG tablet Take 5 mg by mouth daily. As directed by anticoagulation clinic       . DISCONTD: digoxin (LANOXIN) 0.0625 mg TABS Take 0.5 tablets (0.0625 mg total) by mouth daily.  30 tablet  3     No past medical history on file.  ROS:   All systems reviewed and negative except as noted in the HPI.   Past Surgical History  Procedure Date  . Insert / replace / remove pacemaker 09-13-1999  . Cardiac catheterization 2001  . Craniotomy clipping 06-2002    unruptured  . Left paraclinoid segment anurysum     [facility]     Family History  Problem Relation Age of Onset  . Heart attack Mother   . Heart attack Father      History   Social History  . Marital Status: Widowed    Spouse Name: N/A    Number of Children: N/A  . Years of Education: N/A   Occupational History  . Not on file.   Social History Main Topics  . Smoking status: Former Smoker    Types: Cigarettes    Quit date: 05/26/2010  . Smokeless tobacco: Never Used  . Alcohol Use: No  . Drug Use: No  . Sexually  Active: Not on file   Other Topics Concern  . Not on file   Social History Narrative  . No narrative on file     BP 108/64  Pulse 77  Ht 5\' 9"  (1.753 m)  Wt 93.895 kg (207 lb)  BMI 30.57 kg/m2  Physical Exam:  Obese, elderly but well appearing woman in NAD HEENT: Unremarkable Neck:  No JVD, no thyromegally Lymphatics:  No adenopathy Back:  No CVA tenderness Lungs:  Clear with no wheezes, rales, or rhonchi HEART:  Regular rate rhythm, no murmurs, no rubs, no clicks Abd:  soft, positive bowel sounds, no organomegally, no rebound, no guarding Ext:  2 plus pulses, no edema, no cyanosis, no clubbing Skin:  No rashes no nodules Neuro:  CN II through XII intact, motor grossly intact  DEVICE  Normal device function.  See PaceArt for details. 36%  ventricular pacing.  Assess/Plan:

## 2011-04-12 NOTE — Assessment & Plan Note (Signed)
Her device is working normally. Will recheck in several months. 

## 2011-04-12 NOTE — Patient Instructions (Signed)
Your physician recommends that you schedule a follow-up appointment in: 1 year  

## 2011-04-18 ENCOUNTER — Encounter: Payer: Medicare Other | Admitting: *Deleted

## 2011-04-25 ENCOUNTER — Ambulatory Visit (INDEPENDENT_AMBULATORY_CARE_PROVIDER_SITE_OTHER): Payer: Medicare Other | Admitting: *Deleted

## 2011-04-25 DIAGNOSIS — Z7901 Long term (current) use of anticoagulants: Secondary | ICD-10-CM

## 2011-04-25 DIAGNOSIS — I4891 Unspecified atrial fibrillation: Secondary | ICD-10-CM

## 2011-04-25 DIAGNOSIS — I2699 Other pulmonary embolism without acute cor pulmonale: Secondary | ICD-10-CM

## 2011-04-25 DIAGNOSIS — I82409 Acute embolism and thrombosis of unspecified deep veins of unspecified lower extremity: Secondary | ICD-10-CM

## 2011-04-25 LAB — POCT INR: INR: 2.8

## 2011-04-26 ENCOUNTER — Encounter: Payer: Medicare Other | Admitting: *Deleted

## 2011-05-02 ENCOUNTER — Encounter: Payer: Self-pay | Admitting: Internal Medicine

## 2011-05-11 ENCOUNTER — Encounter: Payer: Self-pay | Admitting: Internal Medicine

## 2011-05-11 DIAGNOSIS — I495 Sick sinus syndrome: Secondary | ICD-10-CM

## 2011-05-16 ENCOUNTER — Ambulatory Visit (INDEPENDENT_AMBULATORY_CARE_PROVIDER_SITE_OTHER): Payer: Medicare Other | Admitting: *Deleted

## 2011-05-16 DIAGNOSIS — I82409 Acute embolism and thrombosis of unspecified deep veins of unspecified lower extremity: Secondary | ICD-10-CM

## 2011-05-16 DIAGNOSIS — Z7901 Long term (current) use of anticoagulants: Secondary | ICD-10-CM | POA: Diagnosis not present

## 2011-05-16 DIAGNOSIS — I4891 Unspecified atrial fibrillation: Secondary | ICD-10-CM

## 2011-05-16 DIAGNOSIS — I2699 Other pulmonary embolism without acute cor pulmonale: Secondary | ICD-10-CM

## 2011-05-16 LAB — POCT INR: INR: 3.7

## 2011-06-06 ENCOUNTER — Ambulatory Visit (INDEPENDENT_AMBULATORY_CARE_PROVIDER_SITE_OTHER): Payer: Medicare Other | Admitting: *Deleted

## 2011-06-06 DIAGNOSIS — I4891 Unspecified atrial fibrillation: Secondary | ICD-10-CM | POA: Diagnosis not present

## 2011-06-06 DIAGNOSIS — Z7901 Long term (current) use of anticoagulants: Secondary | ICD-10-CM

## 2011-06-06 DIAGNOSIS — I82409 Acute embolism and thrombosis of unspecified deep veins of unspecified lower extremity: Secondary | ICD-10-CM

## 2011-06-06 DIAGNOSIS — I2699 Other pulmonary embolism without acute cor pulmonale: Secondary | ICD-10-CM | POA: Diagnosis not present

## 2011-06-06 LAB — POCT INR: INR: 2.3

## 2011-07-04 ENCOUNTER — Ambulatory Visit (INDEPENDENT_AMBULATORY_CARE_PROVIDER_SITE_OTHER): Payer: Medicare Other | Admitting: *Deleted

## 2011-07-04 DIAGNOSIS — I4891 Unspecified atrial fibrillation: Secondary | ICD-10-CM | POA: Diagnosis not present

## 2011-07-04 DIAGNOSIS — I82409 Acute embolism and thrombosis of unspecified deep veins of unspecified lower extremity: Secondary | ICD-10-CM

## 2011-07-04 DIAGNOSIS — I2699 Other pulmonary embolism without acute cor pulmonale: Secondary | ICD-10-CM | POA: Diagnosis not present

## 2011-07-04 DIAGNOSIS — Z7901 Long term (current) use of anticoagulants: Secondary | ICD-10-CM | POA: Diagnosis not present

## 2011-07-04 LAB — POCT INR: INR: 3.1

## 2011-08-01 ENCOUNTER — Ambulatory Visit (INDEPENDENT_AMBULATORY_CARE_PROVIDER_SITE_OTHER): Payer: Medicare Other | Admitting: *Deleted

## 2011-08-01 DIAGNOSIS — I82409 Acute embolism and thrombosis of unspecified deep veins of unspecified lower extremity: Secondary | ICD-10-CM

## 2011-08-01 DIAGNOSIS — I4891 Unspecified atrial fibrillation: Secondary | ICD-10-CM

## 2011-08-01 DIAGNOSIS — Z7901 Long term (current) use of anticoagulants: Secondary | ICD-10-CM | POA: Diagnosis not present

## 2011-08-01 DIAGNOSIS — I2699 Other pulmonary embolism without acute cor pulmonale: Secondary | ICD-10-CM | POA: Diagnosis not present

## 2011-08-01 LAB — POCT INR: INR: 2.5

## 2011-08-10 ENCOUNTER — Encounter: Payer: Self-pay | Admitting: Internal Medicine

## 2011-08-10 DIAGNOSIS — I495 Sick sinus syndrome: Secondary | ICD-10-CM | POA: Diagnosis not present

## 2011-08-29 ENCOUNTER — Ambulatory Visit (INDEPENDENT_AMBULATORY_CARE_PROVIDER_SITE_OTHER): Payer: Medicare Other | Admitting: *Deleted

## 2011-08-29 DIAGNOSIS — Z7901 Long term (current) use of anticoagulants: Secondary | ICD-10-CM | POA: Diagnosis not present

## 2011-08-29 DIAGNOSIS — I82409 Acute embolism and thrombosis of unspecified deep veins of unspecified lower extremity: Secondary | ICD-10-CM | POA: Diagnosis not present

## 2011-08-29 DIAGNOSIS — I2699 Other pulmonary embolism without acute cor pulmonale: Secondary | ICD-10-CM

## 2011-08-29 DIAGNOSIS — I4891 Unspecified atrial fibrillation: Secondary | ICD-10-CM

## 2011-08-29 LAB — POCT INR: INR: 3.5

## 2011-09-19 ENCOUNTER — Ambulatory Visit (INDEPENDENT_AMBULATORY_CARE_PROVIDER_SITE_OTHER): Payer: Medicare Other | Admitting: *Deleted

## 2011-09-19 DIAGNOSIS — I2699 Other pulmonary embolism without acute cor pulmonale: Secondary | ICD-10-CM | POA: Diagnosis not present

## 2011-09-19 DIAGNOSIS — I82409 Acute embolism and thrombosis of unspecified deep veins of unspecified lower extremity: Secondary | ICD-10-CM | POA: Diagnosis not present

## 2011-09-19 DIAGNOSIS — Z7901 Long term (current) use of anticoagulants: Secondary | ICD-10-CM

## 2011-09-19 DIAGNOSIS — I4891 Unspecified atrial fibrillation: Secondary | ICD-10-CM | POA: Diagnosis not present

## 2011-09-19 LAB — POCT INR: INR: 1.8

## 2011-10-10 ENCOUNTER — Ambulatory Visit (INDEPENDENT_AMBULATORY_CARE_PROVIDER_SITE_OTHER): Payer: Medicare Other | Admitting: *Deleted

## 2011-10-10 DIAGNOSIS — I82409 Acute embolism and thrombosis of unspecified deep veins of unspecified lower extremity: Secondary | ICD-10-CM | POA: Diagnosis not present

## 2011-10-10 DIAGNOSIS — I2699 Other pulmonary embolism without acute cor pulmonale: Secondary | ICD-10-CM

## 2011-10-10 DIAGNOSIS — I4891 Unspecified atrial fibrillation: Secondary | ICD-10-CM | POA: Diagnosis not present

## 2011-10-10 DIAGNOSIS — Z7901 Long term (current) use of anticoagulants: Secondary | ICD-10-CM

## 2011-10-10 LAB — POCT INR: INR: 2.2

## 2011-10-11 ENCOUNTER — Other Ambulatory Visit (HOSPITAL_COMMUNITY): Payer: Self-pay | Admitting: Pulmonary Disease

## 2011-10-11 ENCOUNTER — Ambulatory Visit (HOSPITAL_COMMUNITY)
Admission: RE | Admit: 2011-10-11 | Discharge: 2011-10-11 | Disposition: A | Discharge status: Home / Self Care | Payer: Medicare Other | Source: Ambulatory Visit | Attending: Pulmonary Disease | Admitting: Pulmonary Disease

## 2011-10-11 DIAGNOSIS — M545 Low back pain, unspecified: Secondary | ICD-10-CM | POA: Diagnosis not present

## 2011-10-11 DIAGNOSIS — I10 Essential (primary) hypertension: Secondary | ICD-10-CM | POA: Diagnosis not present

## 2011-10-11 DIAGNOSIS — M5137 Other intervertebral disc degeneration, lumbosacral region: Secondary | ICD-10-CM | POA: Diagnosis not present

## 2011-10-11 DIAGNOSIS — M169 Osteoarthritis of hip, unspecified: Secondary | ICD-10-CM | POA: Insufficient documentation

## 2011-10-11 DIAGNOSIS — M25559 Pain in unspecified hip: Secondary | ICD-10-CM | POA: Diagnosis not present

## 2011-10-11 DIAGNOSIS — M109 Gout, unspecified: Secondary | ICD-10-CM | POA: Diagnosis not present

## 2011-10-11 DIAGNOSIS — M1610 Unilateral primary osteoarthritis, unspecified hip: Secondary | ICD-10-CM | POA: Insufficient documentation

## 2011-10-11 DIAGNOSIS — M47817 Spondylosis without myelopathy or radiculopathy, lumbosacral region: Secondary | ICD-10-CM | POA: Diagnosis not present

## 2011-10-11 DIAGNOSIS — I4891 Unspecified atrial fibrillation: Secondary | ICD-10-CM | POA: Diagnosis not present

## 2011-10-11 DIAGNOSIS — J449 Chronic obstructive pulmonary disease, unspecified: Secondary | ICD-10-CM | POA: Diagnosis not present

## 2011-10-24 ENCOUNTER — Ambulatory Visit (INDEPENDENT_AMBULATORY_CARE_PROVIDER_SITE_OTHER): Payer: Medicare Other | Admitting: *Deleted

## 2011-10-24 DIAGNOSIS — I2699 Other pulmonary embolism without acute cor pulmonale: Secondary | ICD-10-CM | POA: Diagnosis not present

## 2011-10-24 DIAGNOSIS — I4891 Unspecified atrial fibrillation: Secondary | ICD-10-CM | POA: Diagnosis not present

## 2011-10-24 DIAGNOSIS — Z7901 Long term (current) use of anticoagulants: Secondary | ICD-10-CM | POA: Diagnosis not present

## 2011-10-24 DIAGNOSIS — I82409 Acute embolism and thrombosis of unspecified deep veins of unspecified lower extremity: Secondary | ICD-10-CM

## 2011-10-24 LAB — POCT INR: INR: 1.5

## 2011-10-29 ENCOUNTER — Telehealth: Payer: Self-pay | Admitting: Adult Health

## 2011-10-29 NOTE — Telephone Encounter (Signed)
[facility] CALLING FOR PT LANOXIN TO BE CALLED IN

## 2011-10-30 MED ORDER — DIGOXIN 125 MCG PO TABS: 125.0000 ug | ORAL_TABLET | ORAL | Status: DC

## 2011-10-30 NOTE — Telephone Encounter (Signed)
rx sent in to pharmacy. Pt notified

## 2011-11-09 DIAGNOSIS — I495 Sick sinus syndrome: Secondary | ICD-10-CM

## 2011-11-14 ENCOUNTER — Ambulatory Visit (INDEPENDENT_AMBULATORY_CARE_PROVIDER_SITE_OTHER): Payer: Medicare Other | Admitting: *Deleted

## 2011-11-14 DIAGNOSIS — Z7901 Long term (current) use of anticoagulants: Secondary | ICD-10-CM | POA: Diagnosis not present

## 2011-11-14 DIAGNOSIS — I82409 Acute embolism and thrombosis of unspecified deep veins of unspecified lower extremity: Secondary | ICD-10-CM | POA: Diagnosis not present

## 2011-11-14 DIAGNOSIS — I2699 Other pulmonary embolism without acute cor pulmonale: Secondary | ICD-10-CM

## 2011-11-14 DIAGNOSIS — I4891 Unspecified atrial fibrillation: Secondary | ICD-10-CM

## 2011-11-14 LAB — POCT INR: INR: 5.2

## 2011-11-20 DIAGNOSIS — IMO0002 Reserved for concepts with insufficient information to code with codable children: Secondary | ICD-10-CM | POA: Diagnosis not present

## 2011-11-20 DIAGNOSIS — Z7901 Long term (current) use of anticoagulants: Secondary | ICD-10-CM | POA: Diagnosis not present

## 2011-11-26 ENCOUNTER — Ambulatory Visit (INDEPENDENT_AMBULATORY_CARE_PROVIDER_SITE_OTHER): Payer: Medicare Other | Admitting: *Deleted

## 2011-11-26 DIAGNOSIS — I2699 Other pulmonary embolism without acute cor pulmonale: Secondary | ICD-10-CM

## 2011-11-26 DIAGNOSIS — Z7901 Long term (current) use of anticoagulants: Secondary | ICD-10-CM | POA: Diagnosis not present

## 2011-11-26 DIAGNOSIS — I4891 Unspecified atrial fibrillation: Secondary | ICD-10-CM | POA: Diagnosis not present

## 2011-11-26 DIAGNOSIS — I82409 Acute embolism and thrombosis of unspecified deep veins of unspecified lower extremity: Secondary | ICD-10-CM | POA: Diagnosis not present

## 2011-11-26 LAB — POCT INR: INR: 3.7

## 2011-12-17 ENCOUNTER — Ambulatory Visit (INDEPENDENT_AMBULATORY_CARE_PROVIDER_SITE_OTHER): Payer: Medicare Other | Admitting: *Deleted

## 2011-12-17 DIAGNOSIS — I82409 Acute embolism and thrombosis of unspecified deep veins of unspecified lower extremity: Secondary | ICD-10-CM

## 2011-12-17 DIAGNOSIS — I2699 Other pulmonary embolism without acute cor pulmonale: Secondary | ICD-10-CM

## 2011-12-17 DIAGNOSIS — I4891 Unspecified atrial fibrillation: Secondary | ICD-10-CM | POA: Diagnosis not present

## 2011-12-17 DIAGNOSIS — Z7901 Long term (current) use of anticoagulants: Secondary | ICD-10-CM

## 2011-12-17 LAB — POCT INR: INR: 3.3

## 2012-01-10 ENCOUNTER — Ambulatory Visit (INDEPENDENT_AMBULATORY_CARE_PROVIDER_SITE_OTHER): Payer: Medicare Other | Admitting: *Deleted

## 2012-01-10 DIAGNOSIS — I82409 Acute embolism and thrombosis of unspecified deep veins of unspecified lower extremity: Secondary | ICD-10-CM

## 2012-01-10 DIAGNOSIS — I2699 Other pulmonary embolism without acute cor pulmonale: Secondary | ICD-10-CM

## 2012-01-10 DIAGNOSIS — I4891 Unspecified atrial fibrillation: Secondary | ICD-10-CM | POA: Diagnosis not present

## 2012-01-10 DIAGNOSIS — Z7901 Long term (current) use of anticoagulants: Secondary | ICD-10-CM

## 2012-01-10 LAB — POCT INR: INR: 2.6

## 2012-02-07 ENCOUNTER — Ambulatory Visit (INDEPENDENT_AMBULATORY_CARE_PROVIDER_SITE_OTHER): Payer: Medicare Other | Admitting: *Deleted

## 2012-02-07 DIAGNOSIS — Z7901 Long term (current) use of anticoagulants: Secondary | ICD-10-CM | POA: Diagnosis not present

## 2012-02-07 DIAGNOSIS — I4891 Unspecified atrial fibrillation: Secondary | ICD-10-CM | POA: Diagnosis not present

## 2012-02-07 DIAGNOSIS — I2699 Other pulmonary embolism without acute cor pulmonale: Secondary | ICD-10-CM

## 2012-02-07 DIAGNOSIS — I82409 Acute embolism and thrombosis of unspecified deep veins of unspecified lower extremity: Secondary | ICD-10-CM

## 2012-02-07 LAB — POCT INR: INR: 3.7

## 2012-02-08 DIAGNOSIS — I495 Sick sinus syndrome: Secondary | ICD-10-CM

## 2012-02-26 ENCOUNTER — Other Ambulatory Visit: Payer: Self-pay | Admitting: Cardiology

## 2012-02-26 MED ORDER — DIGOXIN 125 MCG PO TABS: 125.0000 ug | ORAL_TABLET | ORAL | Status: DC

## 2012-02-28 ENCOUNTER — Ambulatory Visit (INDEPENDENT_AMBULATORY_CARE_PROVIDER_SITE_OTHER): Payer: Medicare Other | Admitting: *Deleted

## 2012-02-28 DIAGNOSIS — Z7901 Long term (current) use of anticoagulants: Secondary | ICD-10-CM

## 2012-02-28 DIAGNOSIS — I2699 Other pulmonary embolism without acute cor pulmonale: Secondary | ICD-10-CM

## 2012-02-28 DIAGNOSIS — I82409 Acute embolism and thrombosis of unspecified deep veins of unspecified lower extremity: Secondary | ICD-10-CM | POA: Diagnosis not present

## 2012-02-28 DIAGNOSIS — I4891 Unspecified atrial fibrillation: Secondary | ICD-10-CM

## 2012-02-28 LAB — POCT INR: INR: 1.5

## 2012-03-07 ENCOUNTER — Emergency Department (HOSPITAL_COMMUNITY): Payer: Medicare Other

## 2012-03-07 ENCOUNTER — Emergency Department (HOSPITAL_COMMUNITY)
Admission: EM | Admit: 2012-03-07 | Discharge: 2012-03-07 | Disposition: A | Discharge status: Home / Self Care | Payer: Medicare Other | Attending: Emergency Medicine | Admitting: Emergency Medicine

## 2012-03-07 ENCOUNTER — Encounter (HOSPITAL_COMMUNITY): Payer: Self-pay

## 2012-03-07 DIAGNOSIS — Z7982 Long term (current) use of aspirin: Secondary | ICD-10-CM | POA: Insufficient documentation

## 2012-03-07 DIAGNOSIS — Z888 Allergy status to other drugs, medicaments and biological substances status: Secondary | ICD-10-CM | POA: Diagnosis not present

## 2012-03-07 DIAGNOSIS — Z7901 Long term (current) use of anticoagulants: Secondary | ICD-10-CM | POA: Diagnosis not present

## 2012-03-07 DIAGNOSIS — M25559 Pain in unspecified hip: Secondary | ICD-10-CM | POA: Diagnosis not present

## 2012-03-07 DIAGNOSIS — Z86718 Personal history of other venous thrombosis and embolism: Secondary | ICD-10-CM | POA: Insufficient documentation

## 2012-03-07 DIAGNOSIS — Z87891 Personal history of nicotine dependence: Secondary | ICD-10-CM | POA: Insufficient documentation

## 2012-03-07 DIAGNOSIS — Z91038 Other insect allergy status: Secondary | ICD-10-CM | POA: Insufficient documentation

## 2012-03-07 DIAGNOSIS — Z86711 Personal history of pulmonary embolism: Secondary | ICD-10-CM | POA: Diagnosis not present

## 2012-03-07 DIAGNOSIS — M19019 Primary osteoarthritis, unspecified shoulder: Secondary | ICD-10-CM | POA: Diagnosis not present

## 2012-03-07 DIAGNOSIS — M47817 Spondylosis without myelopathy or radiculopathy, lumbosacral region: Secondary | ICD-10-CM | POA: Diagnosis not present

## 2012-03-07 DIAGNOSIS — I4891 Unspecified atrial fibrillation: Secondary | ICD-10-CM | POA: Insufficient documentation

## 2012-03-07 DIAGNOSIS — Z79899 Other long term (current) drug therapy: Secondary | ICD-10-CM | POA: Insufficient documentation

## 2012-03-07 DIAGNOSIS — M009 Pyogenic arthritis, unspecified: Secondary | ICD-10-CM | POA: Insufficient documentation

## 2012-03-07 DIAGNOSIS — M1990 Unspecified osteoarthritis, unspecified site: Secondary | ICD-10-CM

## 2012-03-07 DIAGNOSIS — M545 Low back pain, unspecified: Secondary | ICD-10-CM | POA: Diagnosis not present

## 2012-03-07 DIAGNOSIS — M1610 Unilateral primary osteoarthritis, unspecified hip: Secondary | ICD-10-CM | POA: Diagnosis not present

## 2012-03-07 DIAGNOSIS — M169 Osteoarthritis of hip, unspecified: Secondary | ICD-10-CM | POA: Diagnosis not present

## 2012-03-07 DIAGNOSIS — Z880 Allergy status to penicillin: Secondary | ICD-10-CM | POA: Diagnosis not present

## 2012-03-07 HISTORY — DX: Other pulmonary embolism without acute cor pulmonale: I26.99

## 2012-03-07 HISTORY — DX: Acute embolism and thrombosis of unspecified deep veins of unspecified lower extremity: I82.409

## 2012-03-07 NOTE — ED Notes (Signed)
Patient with no complaints at this time. Respirations even and unlabored. Skin warm/dry. Discharge instructions reviewed with patient at this time. Patient given opportunity to voice concerns/ask questions. Patient discharged at this time and left Emergency Department with steady gait.   

## 2012-03-07 NOTE — ED Provider Notes (Signed)
History     CSN: [id number]  Arrival date & time 03/07/12  1159   First MD Initiated Contact with Patient 03/07/12 1326      Chief Complaint  Patient presents with  . Shoulder Pain  . Leg Pain     HPI Pt was seen at 1340.  Per pt, c/o gradual onset and persistence of constant left shoulder, left hip and left sided LBP for the past 1 week.   Pain worses with movement and palpation of the areas.  Denies injury, no CP/SOB, no focal motor weakness, no tingling/numbness in extremities, no abd pain.   Past Medical History  Diagnosis Date  . Atrial fibrillation   . DVT (deep venous thrombosis)   . Pulmonary embolism     Past Surgical History  Procedure Date  . Insert / replace / remove pacemaker 09-13-1999  . Cardiac catheterization 2001  . Craniotomy clipping 06-2002    unruptured  . Left paraclinoid segment anurysum     [facility]    Family History  Problem Relation Age of Onset  . Heart attack Mother   . Heart attack Father     History  Substance Use Topics  . Smoking status: Former Smoker    Types: Cigarettes    Quit date: 05/26/2010  . Smokeless tobacco: Never Used  . Alcohol Use: No    Review of Systems ROS: Statement: All systems negative except as marked or noted in the HPI; Constitutional: Negative for fever and chills. ; ; Eyes: Negative for eye pain, redness and discharge. ; ; ENMT: Negative for ear pain, hoarseness, nasal congestion, sinus pressure and sore throat. ; ; Cardiovascular: Negative for chest pain, palpitations, diaphoresis, dyspnea and peripheral edema. ; ; Respiratory: Negative for cough, wheezing and stridor. ; ; Gastrointestinal: Negative for nausea, vomiting, diarrhea, abdominal pain, blood in stool, hematemesis, jaundice and rectal bleeding. . ; ; Genitourinary: Negative for dysuria, flank pain and hematuria. ; ; Musculoskeletal: +shoulder pain, back pain, hip pain. Negative for neck pain. Negative for swelling and trauma.;  ; Skin: Negative for pruritus, rash, abrasions, blisters, bruising and skin lesion.; ; Neuro: Negative for headache, lightheadedness and neck stiffness. Negative for weakness, altered level of consciousness , altered mental status, extremity weakness, paresthesias, involuntary movement, seizure and syncope.       Allergies  Bee venom; Codeine; Penicillins; and Nutritional supplements  Home Medications   Current Outpatient Rx  Name Route Sig Dispense Refill  . ALLOPURINOL 300 MG PO TABS Oral Take 1 tablet by mouth Daily.    . ASPIRIN 81 MG PO TBEC Oral Take 81 mg by mouth daily.      Marland Kitchen DIGOXIN 0.125 MG PO TABS Oral Take 1 tablet (125 mcg total) by mouth as directed. Take one-half by mouth daily 15 tablet 3  . DILTIAZEM HCL ER COATED BEADS 240 MG PO CP24 Oral Take 240 mg by mouth daily.      Marland Kitchen GABAPENTIN 300 MG PO CAPS Oral Take 300 mg by mouth at bedtime.      Marland Kitchen METOPROLOL SUCCINATE ER 100 MG PO TB24 Oral Take 100 mg by mouth Daily.     Marland Kitchen NAPROXEN 500 MG PO TABS Oral Take 500 mg by mouth 2 (two) times daily with a meal.    . WARFARIN SODIUM 5 MG PO TABS Oral Take 5 mg by mouth daily. As directed by anticoagulation clinic       BP 130/60  Pulse 80  Temp 97.6  F (36.4 C)  Resp 20  Ht 5\' 8"  (1.727 m)  Wt 240 lb (108.863 kg)  BMI 36.49 kg/m2  SpO2 98%  Physical Exam 1345: Physical examination:  Nursing notes reviewed; Vital signs and O2 SAT reviewed;  Constitutional: Well developed, Well nourished, Well hydrated, In no acute distress; Head:  Normocephalic, atraumatic; Eyes: EOMI, PERRL, No scleral icterus; ENMT: Mouth and pharynx normal, Mucous membranes moist; Neck: Supple, Full range of motion, No lymphadenopathy; Cardiovascular: Regular rate and rhythm, No murmur, rub, or gallop; Respiratory: Breath sounds clear & equal bilaterally, No rales, rhonchi, wheezes.  Speaking full sentences with ease, Normal respiratory effort/excursion; Chest: Nontender, Movement normal; Abdomen: Soft,  Nontender, Nondistended, Normal bowel sounds; Genitourinary: No CVA tenderness; Spine:  No midline CS, TS, LS tenderness. +mild TTP left lower lumbar paraspinal muscles and SI joint areas.;; Extremities: Pulses normal, Pelvis stable. No left hip/knee/ankle tenderness, No edema, No calf edema or asymmetry.  Left shoulder w/FROM. Pain at end of full ABD.  +mild tenderness to palp entire joint.  Left clavicle NT, scapula NT, proximal humerus NT, biceps tendon NT over bicipital groove.  Motor strength at shoulder normal.  Sensation intact over deltoid region, distal NMS intact with left hand having intact sensation and strength in the distribution of the median, radial, and ulnar nerve function.  Strong radial pulse.  +FROM left elbow with intact motor strength biceps and triceps muscles to resistance.; Neuro: AA&Ox3, Major CN grossly intact.  Speech clear. Strength 5/5 equal bilat UE's and LE's, including great toe dorsiflexion.  DTR 2/4 equal bilat UE's and LE's.  No gross sensory deficits.  Neg straight leg raises bilat. Gait steady.; Skin: Color normal, Warm, Dry.   ED Course  Procedures    MDM  MDM Reviewed: previous chart, nursing note and vitals Interpretation: x-ray     Dg Lumbar Spine Complete 03/07/2012  *RADIOLOGY REPORT*  Clinical Data: Pain without trauma.  LUMBAR SPINE - COMPLETE 4+ VIEW  Comparison: 10/11/2011  Findings: Five lumbar type vertebral bodies.  Sacroiliac joints are symmetric.  Cholecystectomy clips. Maintenance of vertebral body height and alignment.  Severe multilevel spondylosis/degenerative disc disease.  IMPRESSION: Spondylosis, without acute finding.   Original Report Authenticated By: Jeronimo Greaves, M.D.    Dg Hip Complete Left 03/07/2012  *RADIOLOGY REPORT*  Clinical Data:  Low back pain and left hip pain.  No injury.  LEFT HIP - COMPLETE 2+ VIEW  Comparison: 10/11/2011  Findings: AP view pelvis and 2 views of the left hip.  Femoral heads are located.  Severe left and  moderate to severe right hip osteoarthritis.  Lower lumbar spondylosis. Sacroiliac joints are symmetric.  No acute fracture.  IMPRESSION: Degenerative change, without acute osseous finding.   Original Report Authenticated By: Jeronimo Greaves, M.D.    Dg Shoulder Left 03/07/2012  *RADIOLOGY REPORT*  Clinical Data: Pain without trauma.  LEFT SHOULDER - 2+ VIEW  Comparison: Chest film 09/01/2010  Findings: Degenerative change of the acromioclavicular joint and undersurface of the acromion.  Degenerative changes of the glenohumeral joint are moderate.  High-riding humeral head, suggesting chronic rotator cuff insufficiency. No acute fracture or dislocation.  Suspect interstitial thickening in the left upper lobe.  IMPRESSION: Degenerative change, without acute osseous finding.   Original Report Authenticated By: Jeronimo Greaves, M.D.      1600:  Pt has climbed off the stretcher by herself easily, gotten herself dressed and wants to go home now.  Will tx symptomatically. Dx and testing d/w pt.  Questions answered.  Verb understanding, agreeable to d/c home with outpt f/u.       Laray Anger, DO 03/10/12 1016

## 2012-03-07 NOTE — ED Notes (Signed)
Pt c/o left shoulder, hip, and leg pain for 4 to 5 days with movement . Denies injury

## 2012-03-13 ENCOUNTER — Ambulatory Visit (INDEPENDENT_AMBULATORY_CARE_PROVIDER_SITE_OTHER): Payer: Medicare Other | Admitting: *Deleted

## 2012-03-13 DIAGNOSIS — I2699 Other pulmonary embolism without acute cor pulmonale: Secondary | ICD-10-CM

## 2012-03-13 DIAGNOSIS — I4891 Unspecified atrial fibrillation: Secondary | ICD-10-CM

## 2012-03-13 DIAGNOSIS — Z7901 Long term (current) use of anticoagulants: Secondary | ICD-10-CM

## 2012-03-13 DIAGNOSIS — I82409 Acute embolism and thrombosis of unspecified deep veins of unspecified lower extremity: Secondary | ICD-10-CM

## 2012-03-13 LAB — POCT INR: INR: 1.7

## 2012-03-31 ENCOUNTER — Ambulatory Visit (INDEPENDENT_AMBULATORY_CARE_PROVIDER_SITE_OTHER): Payer: Medicare Other | Admitting: *Deleted

## 2012-03-31 DIAGNOSIS — Z7901 Long term (current) use of anticoagulants: Secondary | ICD-10-CM

## 2012-03-31 DIAGNOSIS — I2699 Other pulmonary embolism without acute cor pulmonale: Secondary | ICD-10-CM

## 2012-03-31 DIAGNOSIS — I4891 Unspecified atrial fibrillation: Secondary | ICD-10-CM | POA: Diagnosis not present

## 2012-03-31 DIAGNOSIS — I82409 Acute embolism and thrombosis of unspecified deep veins of unspecified lower extremity: Secondary | ICD-10-CM | POA: Diagnosis not present

## 2012-03-31 LAB — POCT INR: INR: 2.2

## 2012-04-11 DIAGNOSIS — I10 Essential (primary) hypertension: Secondary | ICD-10-CM | POA: Diagnosis not present

## 2012-04-11 DIAGNOSIS — I4891 Unspecified atrial fibrillation: Secondary | ICD-10-CM | POA: Diagnosis not present

## 2012-04-11 DIAGNOSIS — J449 Chronic obstructive pulmonary disease, unspecified: Secondary | ICD-10-CM | POA: Diagnosis not present

## 2012-04-11 DIAGNOSIS — M1990 Unspecified osteoarthritis, unspecified site: Secondary | ICD-10-CM | POA: Diagnosis not present

## 2012-04-24 ENCOUNTER — Ambulatory Visit (INDEPENDENT_AMBULATORY_CARE_PROVIDER_SITE_OTHER): Payer: Medicare Other | Admitting: *Deleted

## 2012-04-24 DIAGNOSIS — I4891 Unspecified atrial fibrillation: Secondary | ICD-10-CM

## 2012-04-24 DIAGNOSIS — I82409 Acute embolism and thrombosis of unspecified deep veins of unspecified lower extremity: Secondary | ICD-10-CM

## 2012-04-24 DIAGNOSIS — I2699 Other pulmonary embolism without acute cor pulmonale: Secondary | ICD-10-CM | POA: Diagnosis not present

## 2012-04-24 DIAGNOSIS — Z7901 Long term (current) use of anticoagulants: Secondary | ICD-10-CM | POA: Diagnosis not present

## 2012-04-24 LAB — POCT INR: INR: 2.3

## 2012-05-12 ENCOUNTER — Encounter: Payer: Medicare Other | Admitting: Internal Medicine

## 2012-05-15 ENCOUNTER — Ambulatory Visit: Payer: Medicare Other | Admitting: Orthopedic Surgery

## 2012-05-15 ENCOUNTER — Encounter: Payer: Self-pay | Admitting: Orthopedic Surgery

## 2012-05-21 ENCOUNTER — Telehealth: Payer: Self-pay | Admitting: Orthopedic Surgery

## 2012-05-21 NOTE — Telephone Encounter (Signed)
Patient called in response to receiving missed appointment letter for problem of left hip pain, referral from Dr Juanetta Gosling.  States will need to hold on re-scheduling appointment due to financial reasons - said having problems with vehicle; also other medical issues.  States will call back to schedule when she can.

## 2012-05-22 ENCOUNTER — Ambulatory Visit (INDEPENDENT_AMBULATORY_CARE_PROVIDER_SITE_OTHER): Payer: Medicare Other | Admitting: *Deleted

## 2012-05-22 DIAGNOSIS — Z7901 Long term (current) use of anticoagulants: Secondary | ICD-10-CM | POA: Diagnosis not present

## 2012-05-22 DIAGNOSIS — I82409 Acute embolism and thrombosis of unspecified deep veins of unspecified lower extremity: Secondary | ICD-10-CM

## 2012-05-22 DIAGNOSIS — I2699 Other pulmonary embolism without acute cor pulmonale: Secondary | ICD-10-CM | POA: Diagnosis not present

## 2012-05-22 DIAGNOSIS — I4891 Unspecified atrial fibrillation: Secondary | ICD-10-CM

## 2012-05-22 LAB — POCT INR: INR: 1.6

## 2012-06-05 ENCOUNTER — Ambulatory Visit (INDEPENDENT_AMBULATORY_CARE_PROVIDER_SITE_OTHER): Payer: Medicare Other | Admitting: *Deleted

## 2012-06-05 DIAGNOSIS — I2699 Other pulmonary embolism without acute cor pulmonale: Secondary | ICD-10-CM | POA: Diagnosis not present

## 2012-06-05 DIAGNOSIS — I4891 Unspecified atrial fibrillation: Secondary | ICD-10-CM | POA: Diagnosis not present

## 2012-06-05 DIAGNOSIS — Z7901 Long term (current) use of anticoagulants: Secondary | ICD-10-CM | POA: Diagnosis not present

## 2012-06-05 DIAGNOSIS — I82409 Acute embolism and thrombosis of unspecified deep veins of unspecified lower extremity: Secondary | ICD-10-CM

## 2012-06-05 LAB — POCT INR: INR: 1.4

## 2012-06-10 IMAGING — CT CT HEAD W/O CM
1 series · 15 of 30 positions shown, 19 images · non-contrast
Comparison: 02/16/2005

CLINICAL DATA: m altered mental status.  New loss of consciousness.
Nystagmus.  History of aneurysms.

CT HEAD WITHOUT CONTRAST
TECHNIQUE: Contiguous axial images were obtained from the base of
the skull through the vertex without contrast.

[Series 2: headseq 4.8 h37s · axial · 0.43mm/px · z∈[+127,+285]mm · 15 of 36 slices shown, 19 images]
[im 2/36  brain]
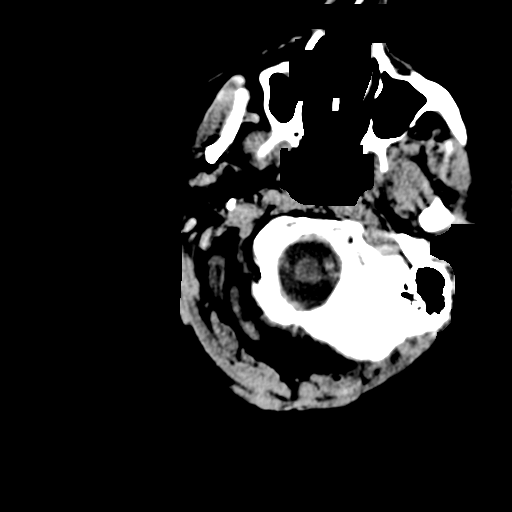
[im 2/36  bone]
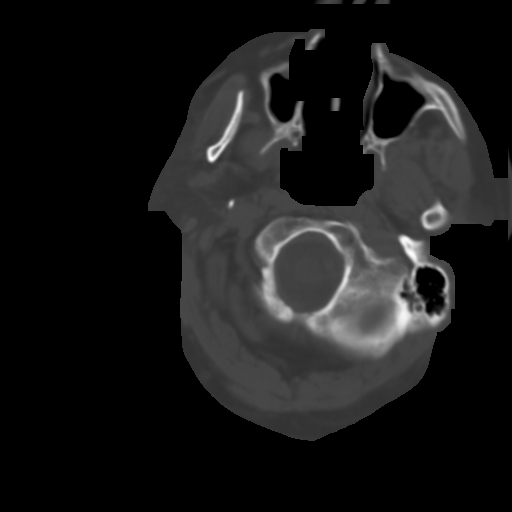
[im 4/36  brain]
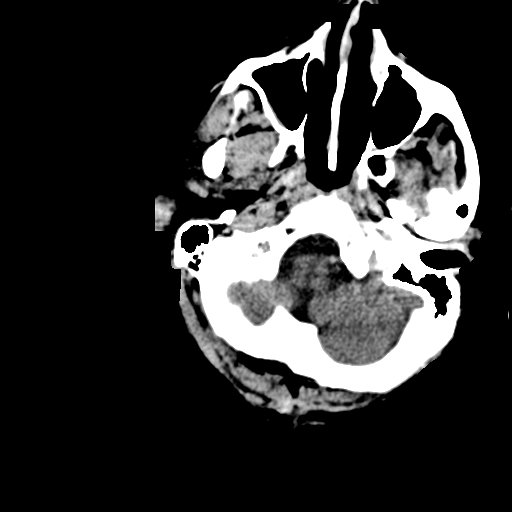
[im 7/36  brain]
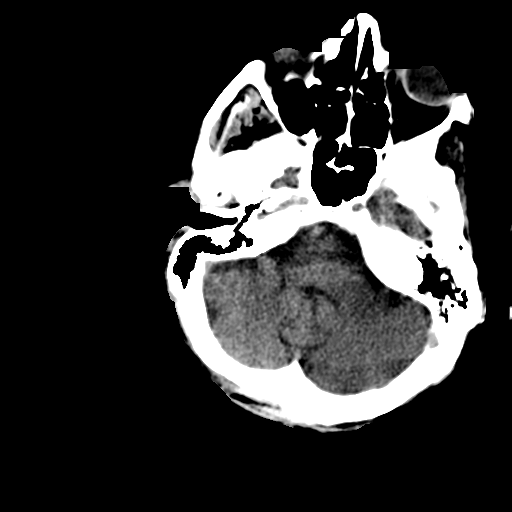
[im 9/36  brain]
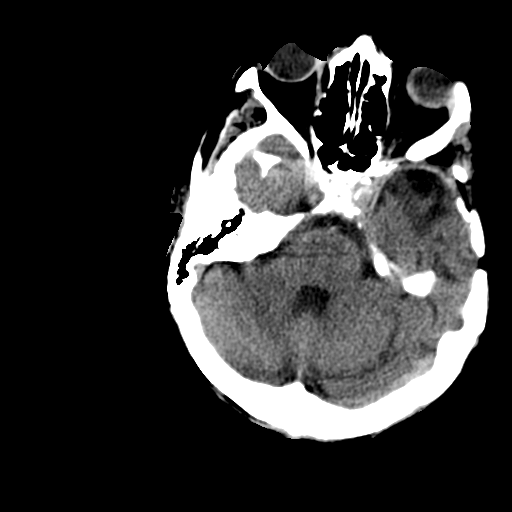
[im 11/36  brain]
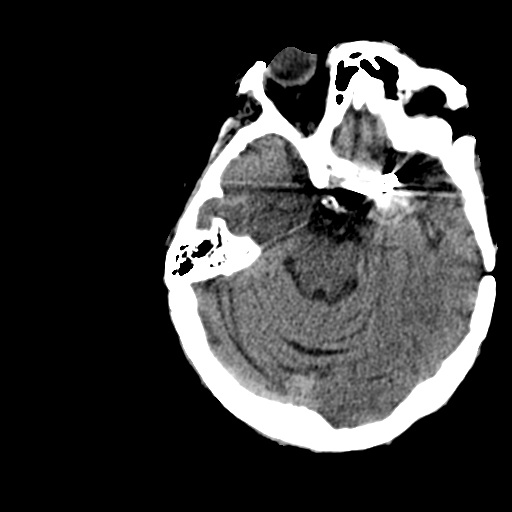
[im 11/36  bone]
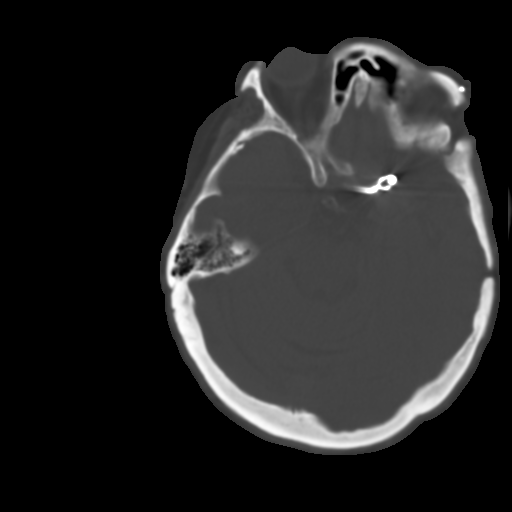
[im 14/36  brain]
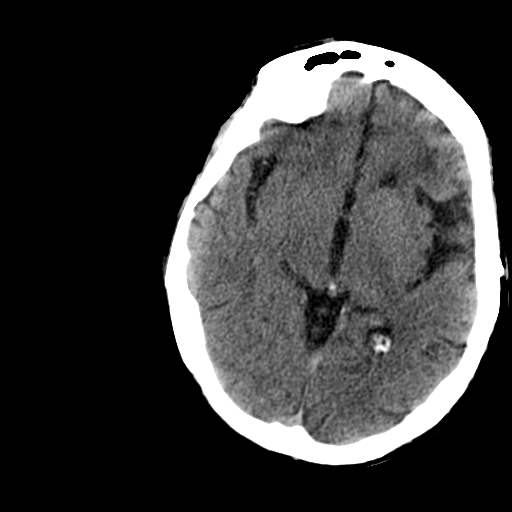
[im 16/36  brain]
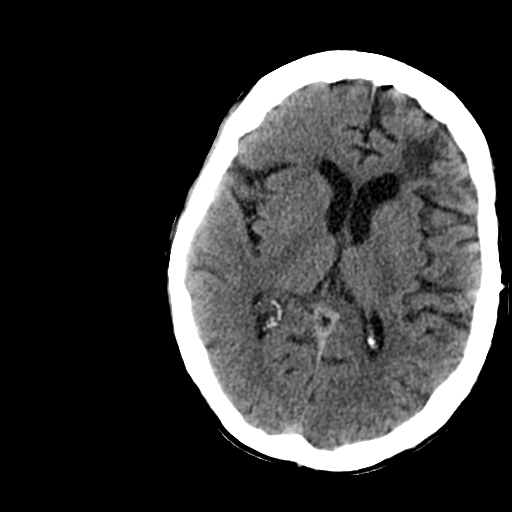
[im 19/36  brain]
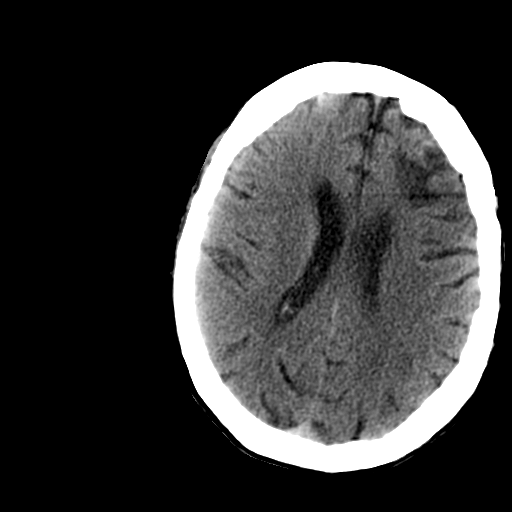
[im 20/36  brain]
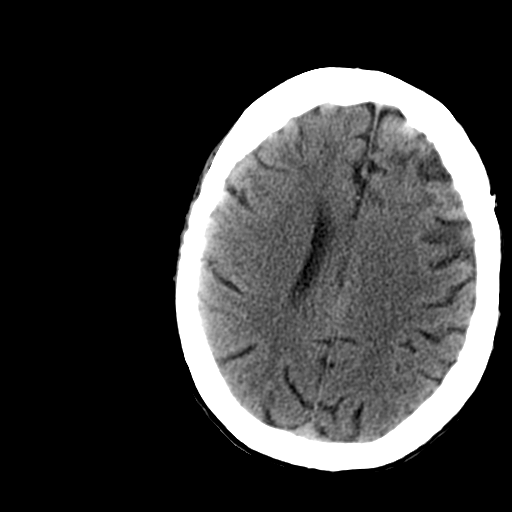
[im 20/36  bone]
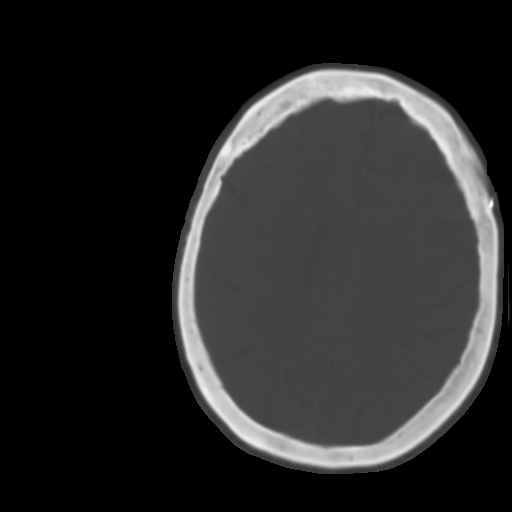
[im 22/36  brain]
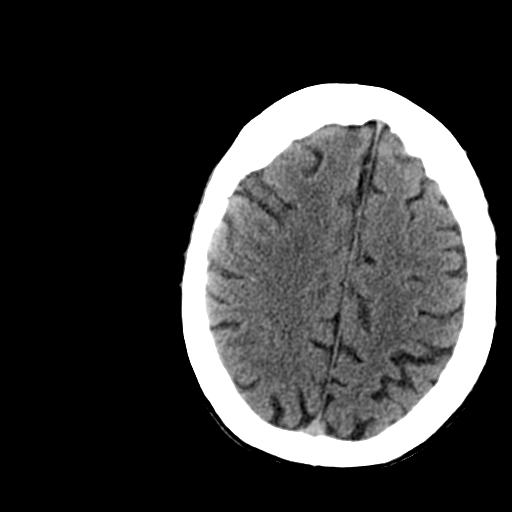
[im 25/36  brain]
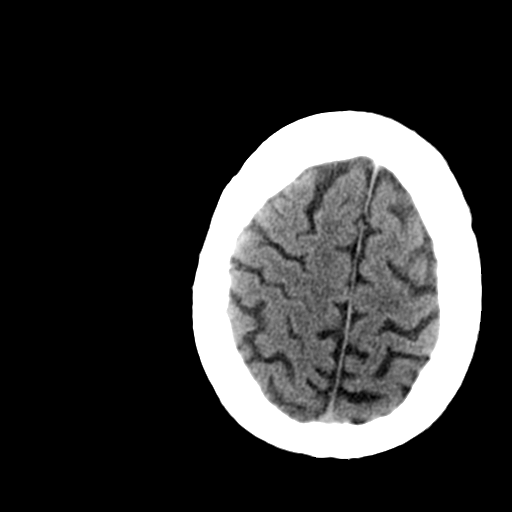
[im 27/36  brain]
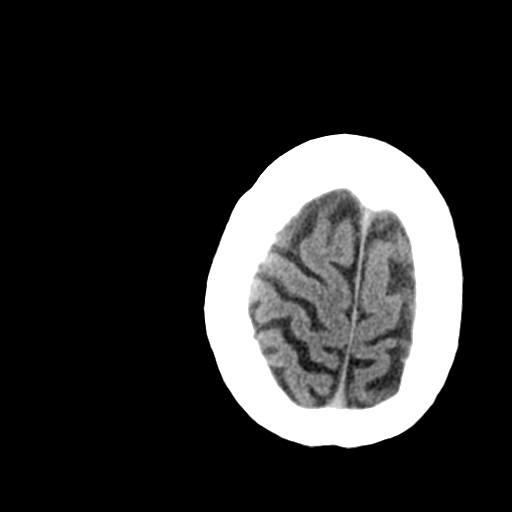
[im 29/36  brain]
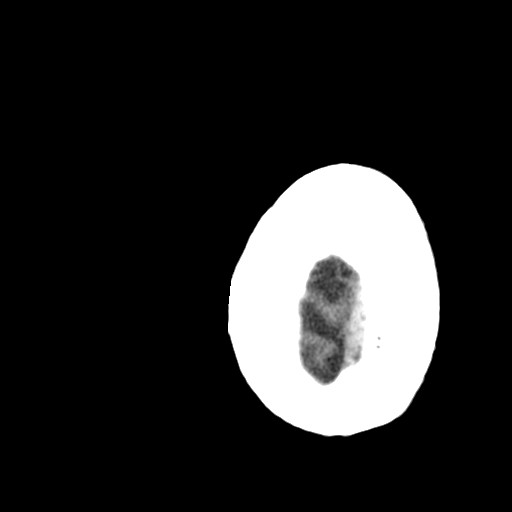
[im 29/36  bone]
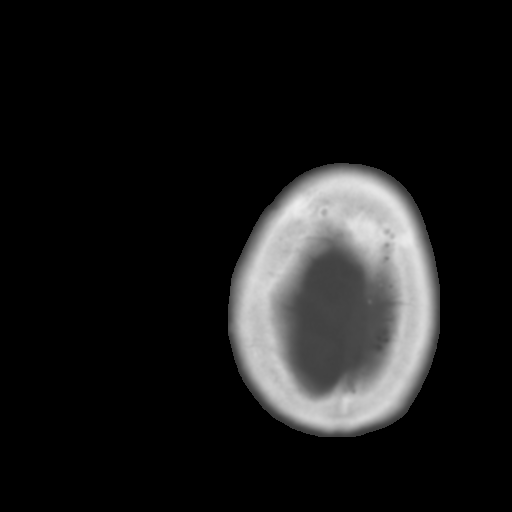
[im 32/36  brain]
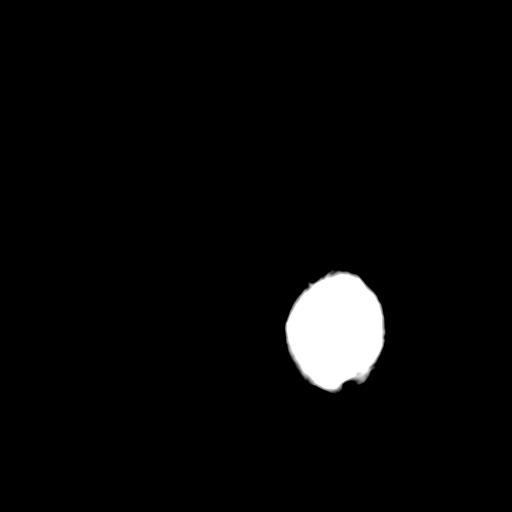
[im 34/36  brain]
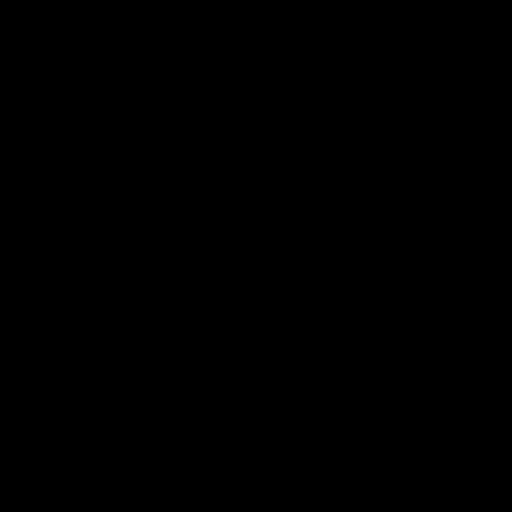

[15 of 30 positions shown; findings below may reference images not displayed]

FINDINGS: Postoperative changes with left frontal craniotomy.
Aneurysm clip in the left anterior skull base as previously
demonstrated.  Encephalomalacia in the left anterior frontal region
suggesting old infarct, stable.  Mild diffuse atrophy.  Low
attenuation change in the deep white matter consistent with small
vessel ischemia.  No mass effect or midline shift.  Gray-white
matter junctions are distinct.  No evidence of acute intracranial
hemorrhage.  No significant sulci effacement.  Mucosal membrane
thickening in the right maxillary antrum consistent with chronic
inflammation.  Study is somewhat technically limited due to motion
artifact and streak artifact from the aneurysm clip.
IMPRESSION: Stable aneurysm clip in the skull base.  Mild diffuse atrophy.  Old
left frontal infarct.  No significant change since previous study.
No evidence of acute intracranial hemorrhage, acute infarct, or
mass lesion.

Critical test results telephoned to Dr. Tiger at the time of
interpretation on 08/24/2010 at 0330 hours.

## 2012-06-10 IMAGING — CT CT ABD-PELV W/ CM
2 of 5 series · 16 of 46 positions shown, 18 images · IV contrast (Omnipaque 300)
Comparison: 03/16/2006

CLINICAL DATA: Generalized abdominal pain and vomiting.  Elevated
liver function studies.

CT ABDOMEN AND PELVIS WITH CONTRAST
TECHNIQUE: Multidetector CT imaging of the abdomen and pelvis was
performed following the standard protocol during bolus
administration of intravenous contrast.
Contrast: 100 ml Omnipaque 300

[Series 2: abd_pel_with 5.0 b40f · axial · 0.87mm/px · z∈[-499,-119]mm · 13 of 90 slices shown, 15 images]
[im 7/90  soft-tissue]
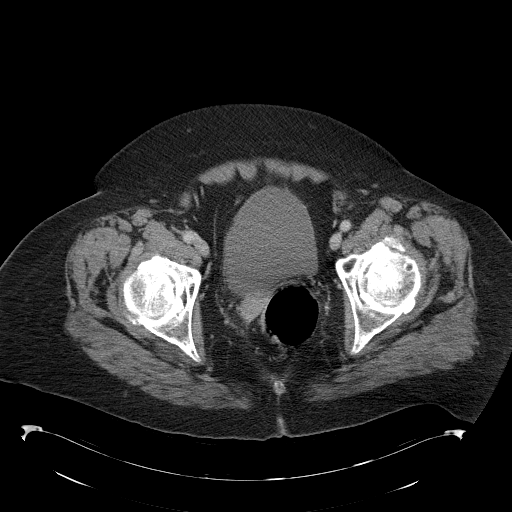
[im 7/90  bone]
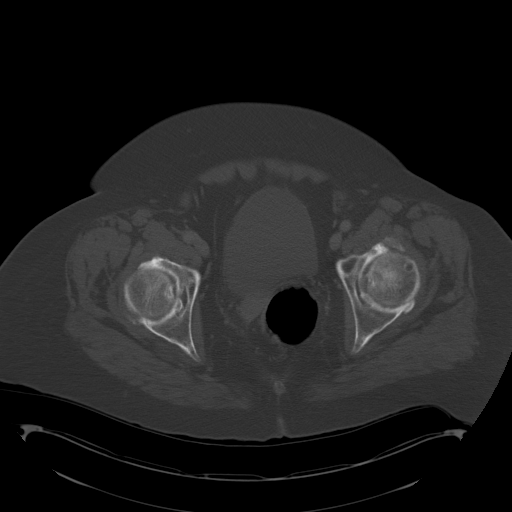
[im 13/90  soft-tissue]
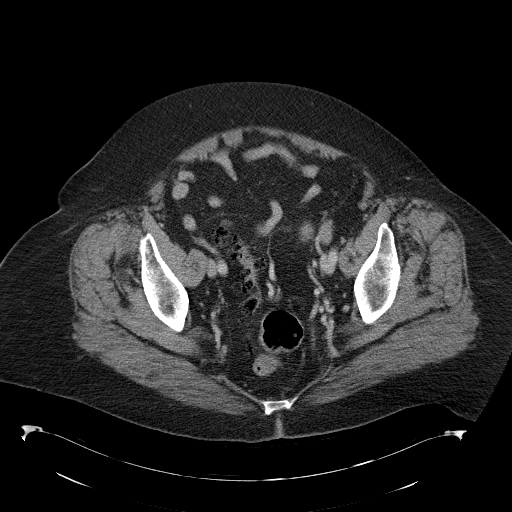
[im 20/90  soft-tissue]
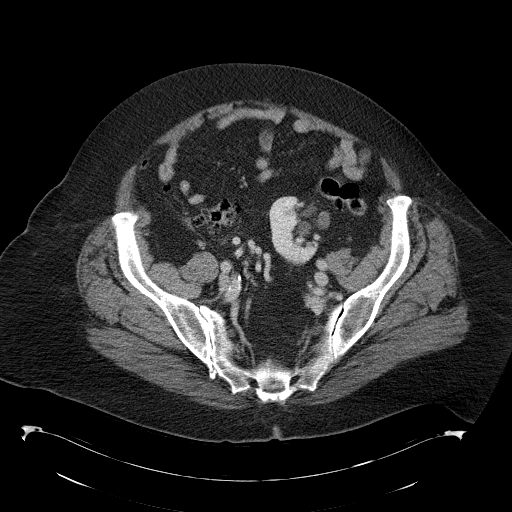
[im 26/90  soft-tissue]
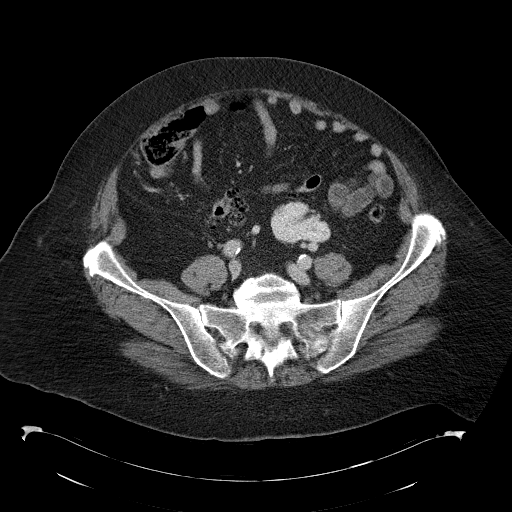
[im 32/90  soft-tissue]
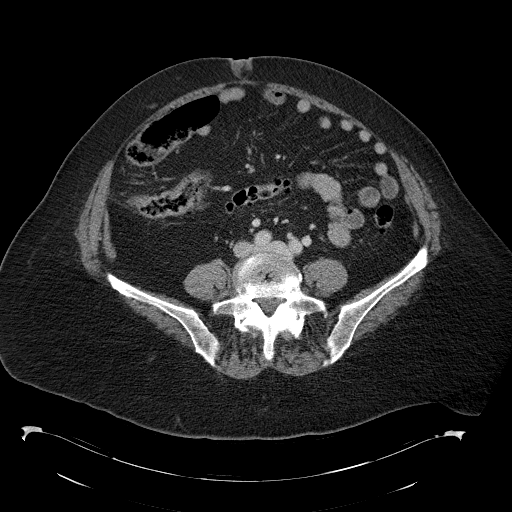
[im 39/90  soft-tissue]
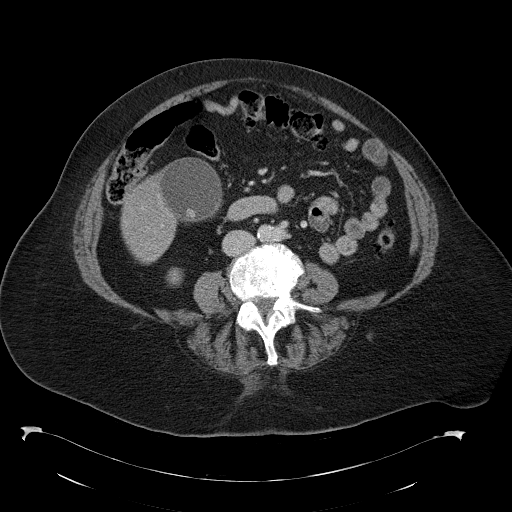
[im 45/90  soft-tissue]
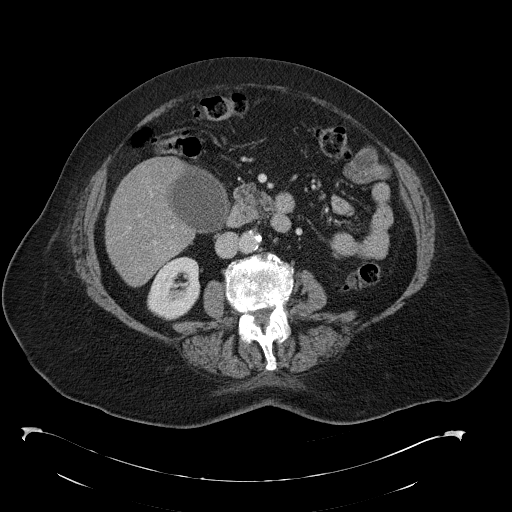
[im 51/90  soft-tissue]
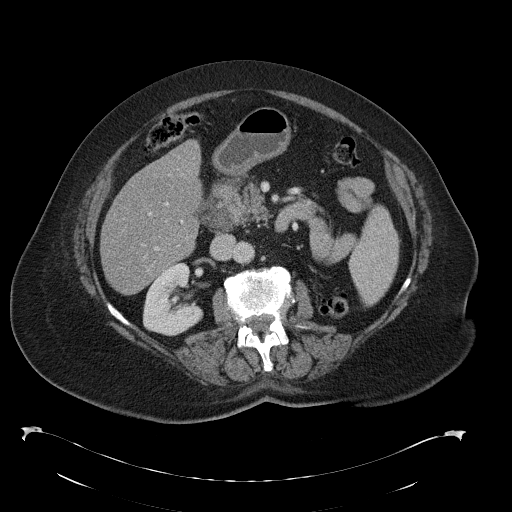
[im 58/90  soft-tissue]
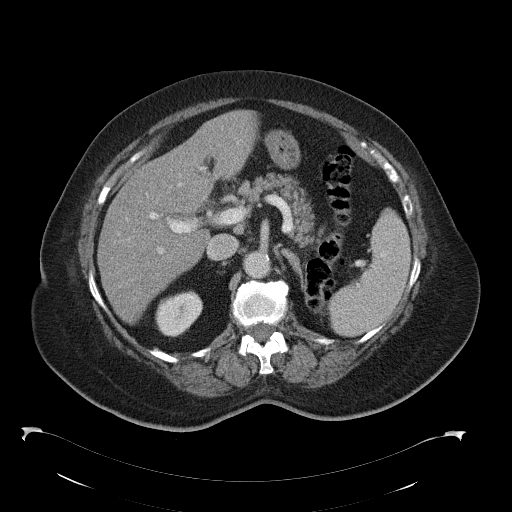
[im 58/90  bone]
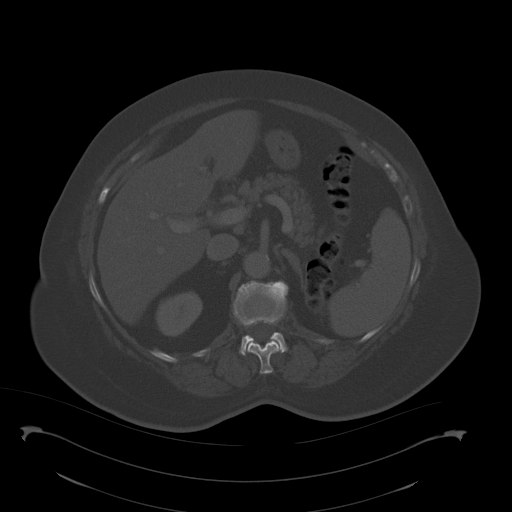
[im 64/90  soft-tissue]
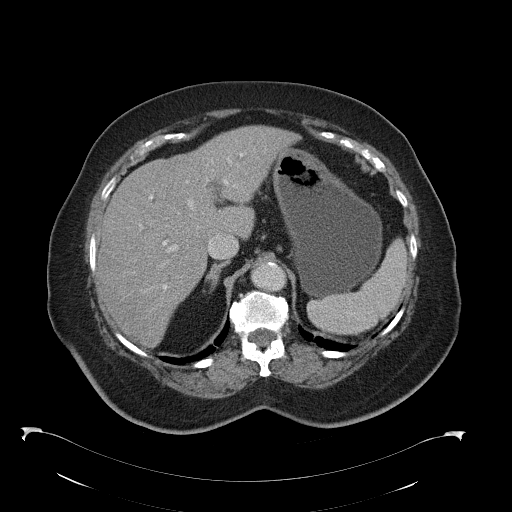
[im 70/90  soft-tissue]
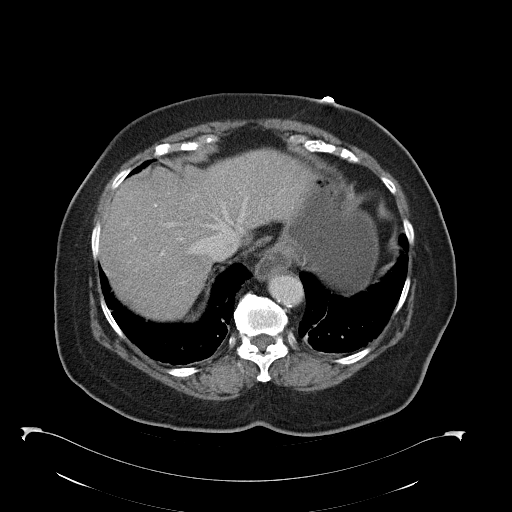
[im 77/90  soft-tissue]
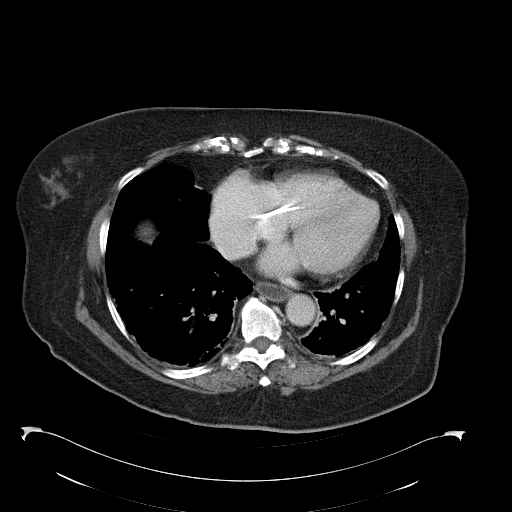
[im 83/90  soft-tissue]
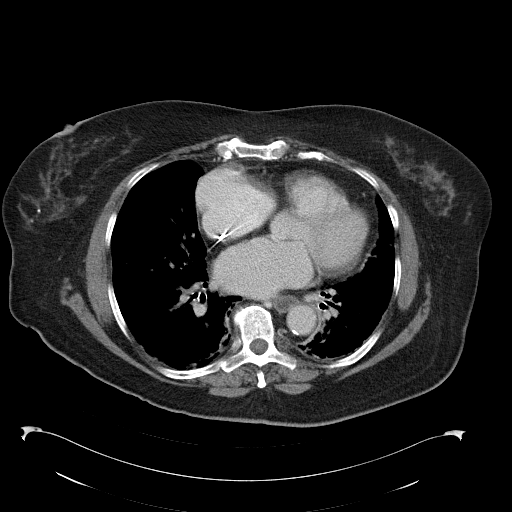

[Series 4: abd_pel_with 3.0 spo · coronal · 0.74mm/px · 3 of 94 slices shown]
[im 32/94  soft-tissue]
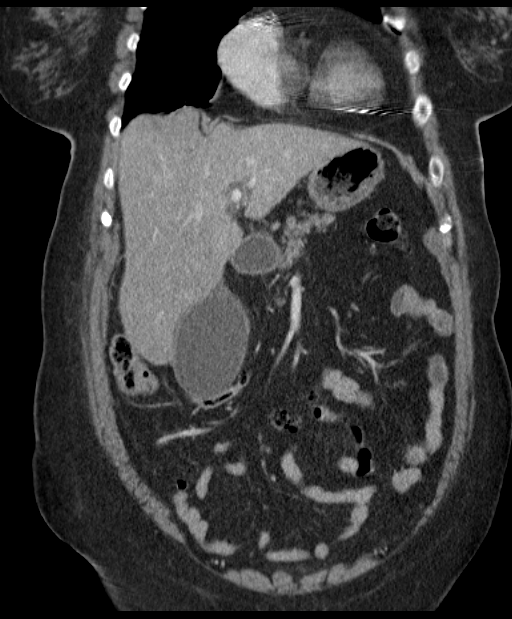
[im 42/94  soft-tissue]
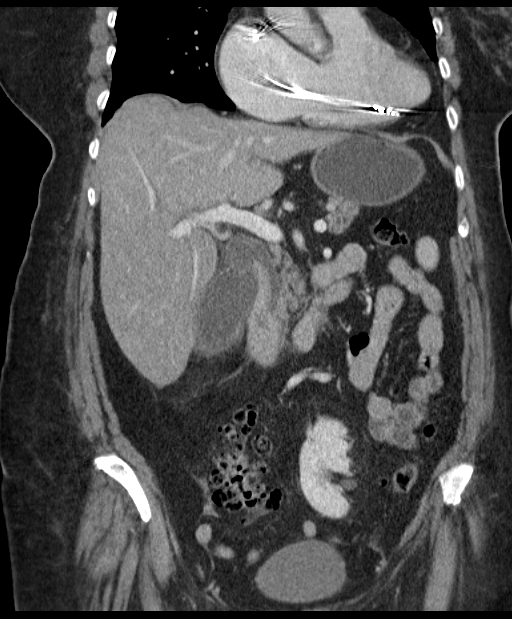
[im 52/94  soft-tissue]
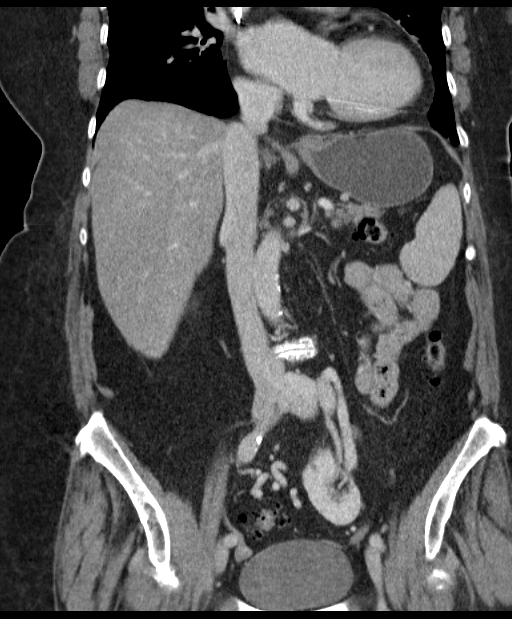

[16 of 46 positions shown; findings below may reference images not displayed]

FINDINGS: Atelectasis or fibrosis in the lung bases.  The
gallbladder is somewhat distended and contains a single stone but
there is no gallbladder wall thickening or edema.  No bile duct
dilatation.  Low attenuation throughout the liver suggesting
diffuse fatty infiltration.  No focal lesions.  Spleen size is
normal.  Pancreas is unremarkable.  No adrenal gland nodules.  Left
pelvic kidney without mass or hydronephrosis.  Right kidney is
normal.  Calcified and tortuous aorta without aneurysmal
dilatation.  Lumbar scoliosis with degenerative change.  No
retroperitoneal lymphadenopathy.  No abdominal ascites.  Small
bowel is not distended.  The stomach wall is not thickened.  There
is evidence of a small esophageal hiatal hernia with fluid in the
lower esophagus which might be due to reflux  or dysmotility.
Small ventral periumbilical hernia containing fat.

Pelvis:  The bladder wall is not thickened.  The uterus extends
toward the right and is atrophic although there is a suggestion of
endometrial fluid and thickening which would be abnormal in a
patient of this age.  Endometrial stripe is measured at about 9 mm.
There is a small calcification in the uterus.  No abnormal adnexal
masses.  Diverticulosis of the sigmoid colon without inflammatory
change.  The appendix is normal. No destructive or expansile bone
lesions.
IMPRESSION: Distended gallbladder with cholelithiasis.  The appearance is
nonspecific but could represent cholecystitis in the appropriate
clinical setting.  No obvious inflammatory changes.  Diffuse fatty
infiltration of the liver.  Small umbilical hernia containing fat.
Atrophic uterus with prominent endometrium.  Endometrial
hyperplasia should be excluded.  Left pelvic kidney is otherwise
normal in appearance.

## 2012-06-10 IMAGING — US US ABDOMEN COMPLETE
1 series · 13 of 25 positions shown · non-contrast
Comparison: None
Correlation:  CT abdomen 08/24/2010

CLINICAL DATA: Abnormal LFTs, abnormal CT abdomen with
cholelithiasis and question cholecystitis, abdominal pain, pelvic
pain

ULTRASOUND ABDOMEN:
TECHNIQUE: Sonography of upper abdominal structures was performed.

[Series 1: us abdomen complete · 0.31mm/px · 13 of 101 slices shown]
[im 1/101]
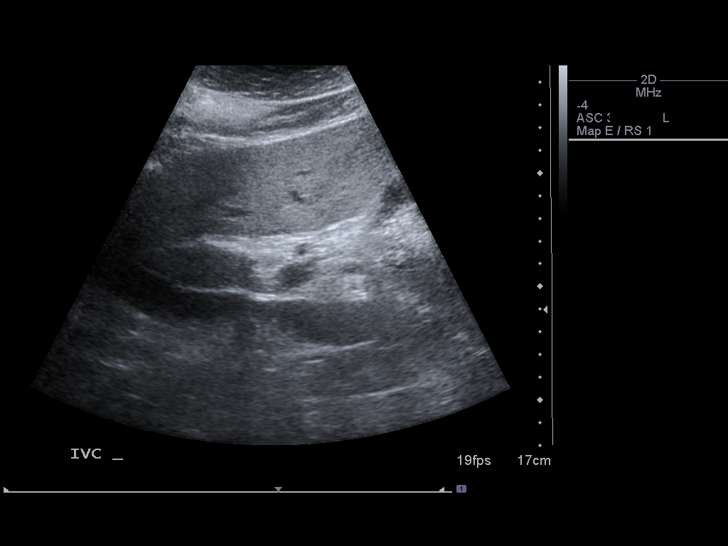
[im 9/101]
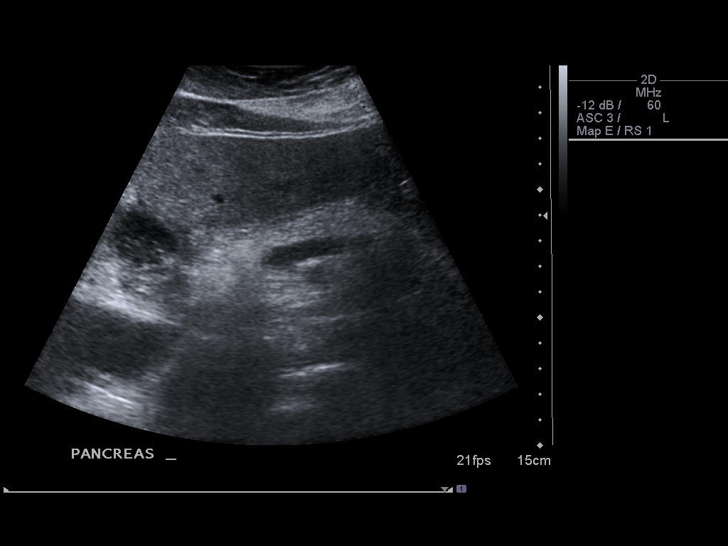
[im 17/101]
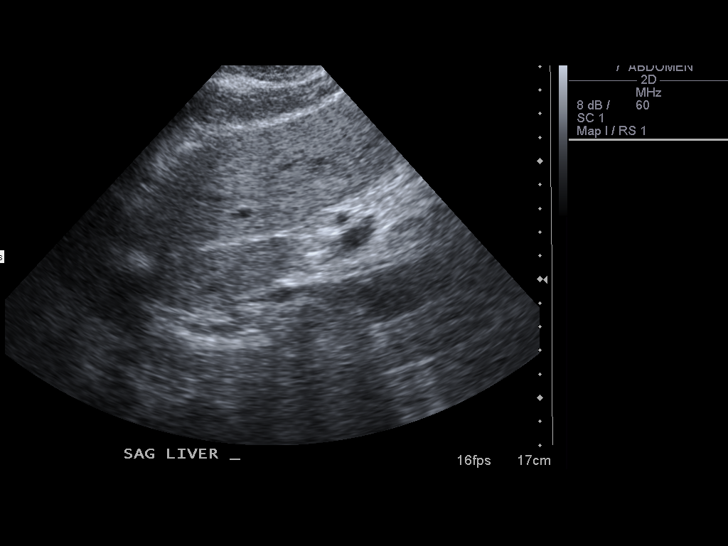
[im 26/101]
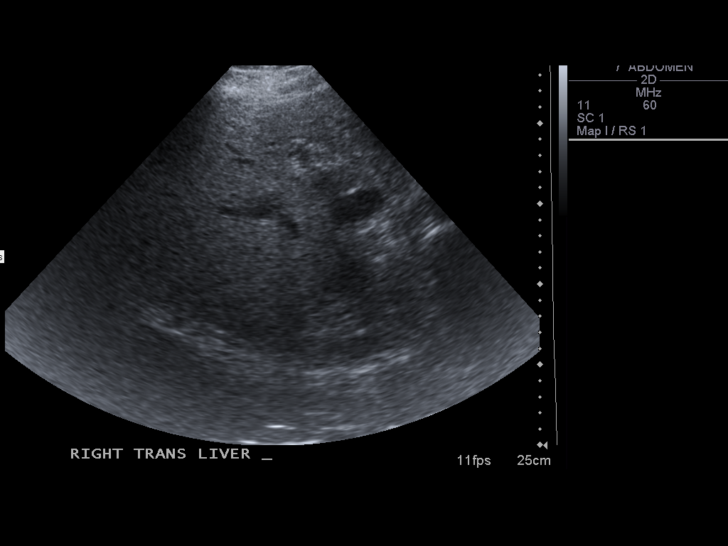
[im 34/101]
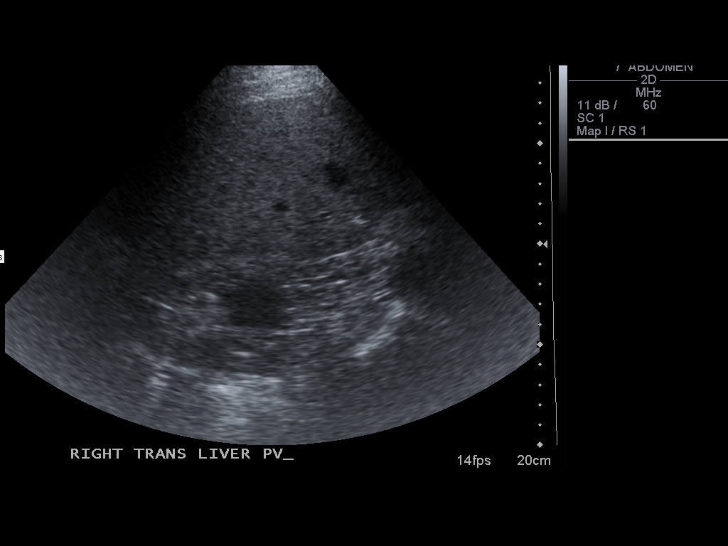
[im 42/101]
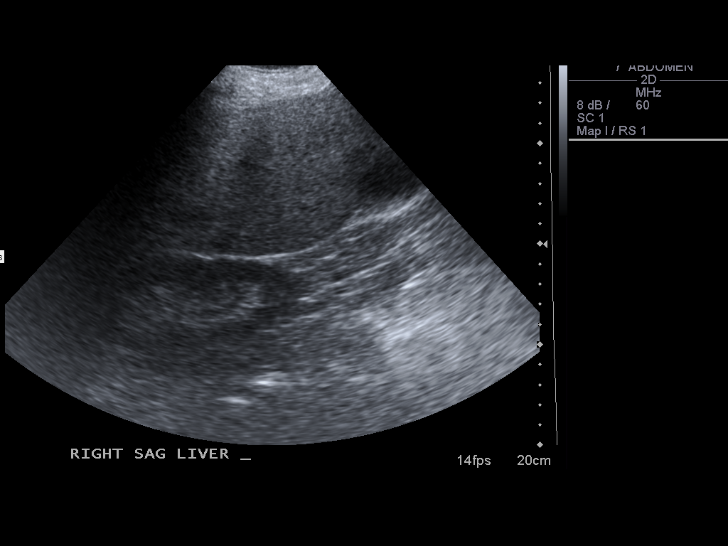
[im 51/101]
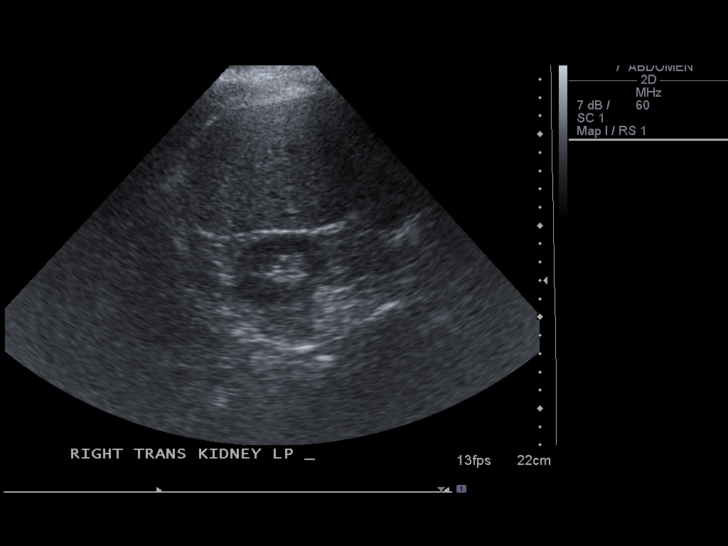
[im 59/101]
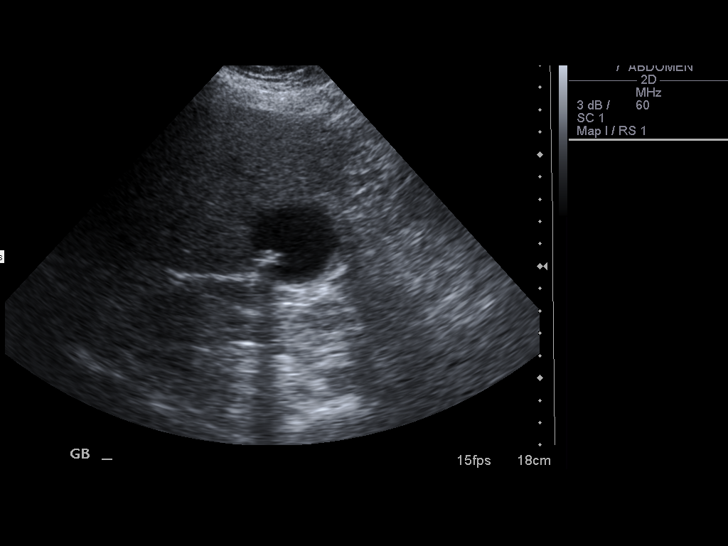
[im 67/101]
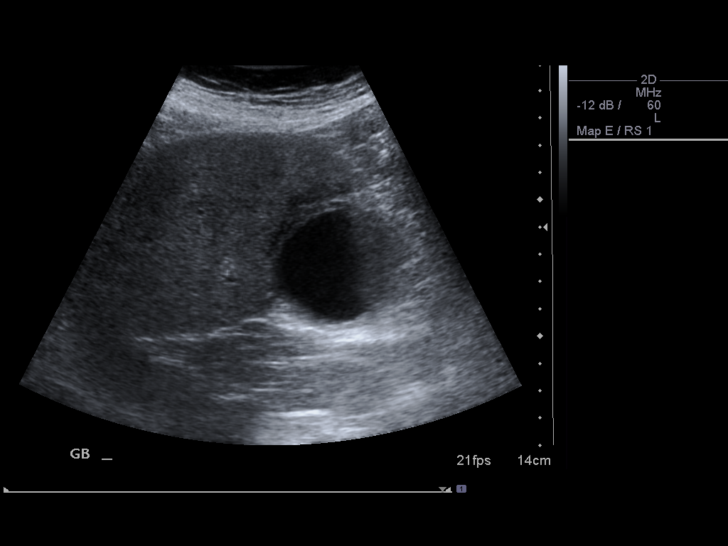
[im 76/101]
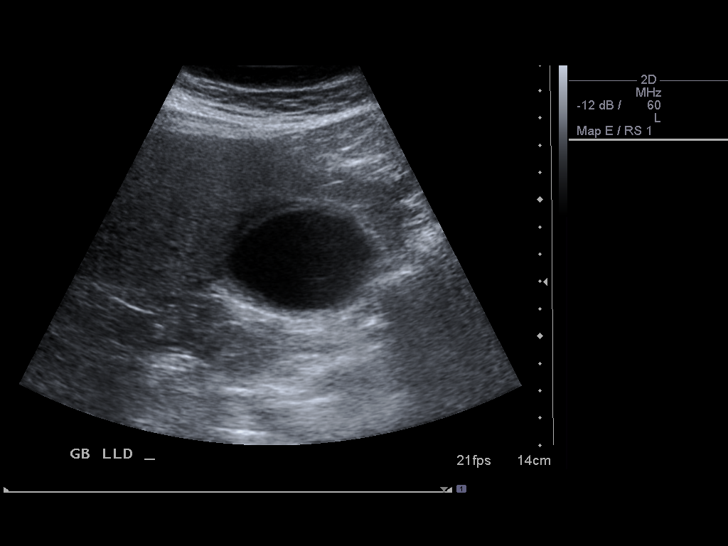
[im 84/101]
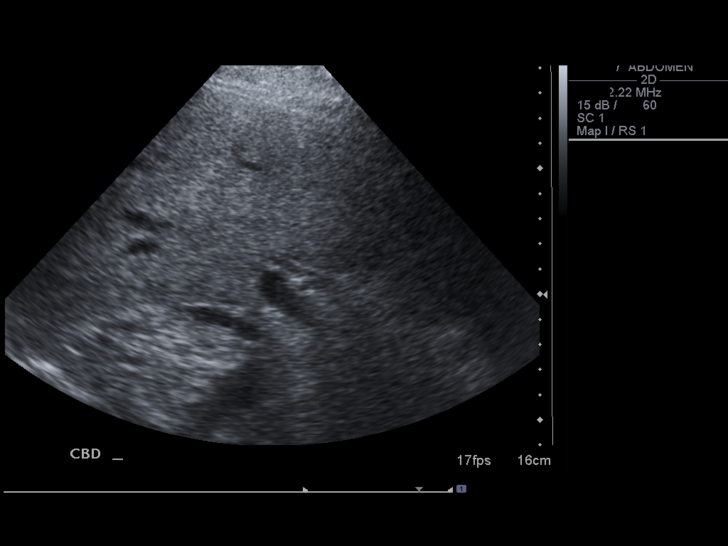
[im 92/101]
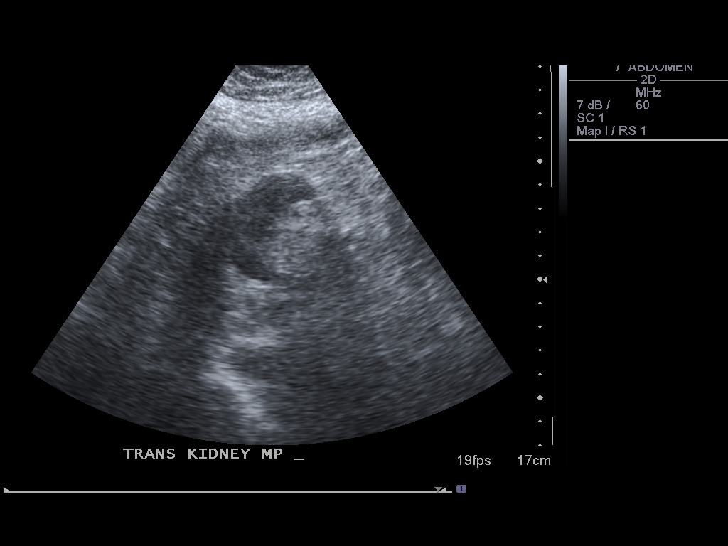
[im 101/101]
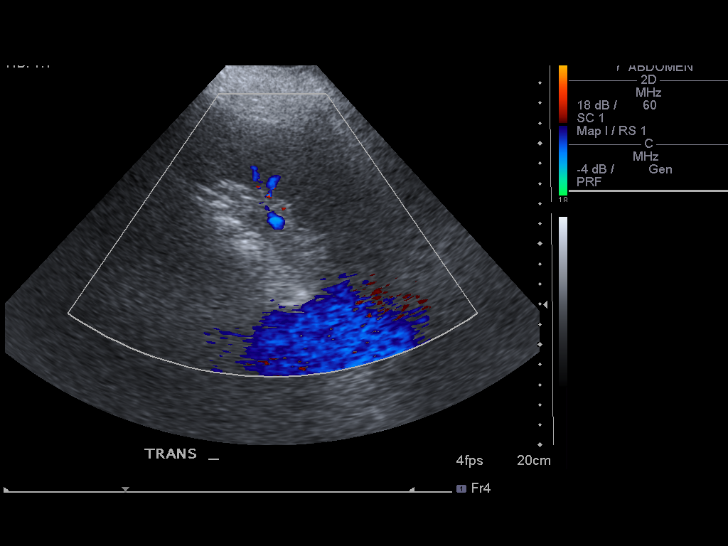

[13 of 25 positions shown; findings below may reference images not displayed]

Gallbladder:  Multiple shadowing calculi in gallbladder 12 mm
greatest size.  Mild gallbladder wall thickening with minimal
edema/striations within wall.
Minimal pericholecystic fluid.  Findings compatible with acute
cholecystitis. The patient received pain medications, unable to
accurately assess for presence of a Murphy's sign.

Common bile duct:  6 mm greatest size

Liver:  Echogenic, likely fatty infiltration, though this can be
seen with cirrhosis and certain infiltrative disorders.  Poor
delineation of intrahepatic anatomy, no gross mass nodularity seen.
Internal hepatic detail is better visualized on recent CT exam.

IVC:  Normal appearance

Pancreas:  Normal appearance

Spleen:  Normal appearance, 8.7 cm length

Right kidney:  11 cm length. Normal morphology without mass or
hydronephrosis.

Left kidney:  Pelvic position, suboptimally visualized, only 7.3 cm
of the length can be delineated.  By recent CT, kidney measured
cm length.  No gross mass or hydronephrosis

Aorta:  Atherosclerotic plaque, normal caliber.

Other:  Small normal-sized peripancreatic lymph nodes.  No free
fluid.
IMPRESSION: Gallstones with gallbladder wall thickening and pericholecystic
fluid, most consistent with acute cholecystitis.
Probable fatty infiltration of liver.
Upper normal CBD diameter, the CBD measured 9 mm diameter on
preceding CT of 08/24/2010.

## 2012-06-12 DIAGNOSIS — J449 Chronic obstructive pulmonary disease, unspecified: Secondary | ICD-10-CM | POA: Diagnosis not present

## 2012-06-12 DIAGNOSIS — I10 Essential (primary) hypertension: Secondary | ICD-10-CM | POA: Diagnosis not present

## 2012-06-12 DIAGNOSIS — I4891 Unspecified atrial fibrillation: Secondary | ICD-10-CM | POA: Diagnosis not present

## 2012-06-12 DIAGNOSIS — M1990 Unspecified osteoarthritis, unspecified site: Secondary | ICD-10-CM | POA: Diagnosis not present

## 2012-06-16 ENCOUNTER — Ambulatory Visit (INDEPENDENT_AMBULATORY_CARE_PROVIDER_SITE_OTHER): Payer: Medicare Other | Admitting: *Deleted

## 2012-06-16 DIAGNOSIS — I4891 Unspecified atrial fibrillation: Secondary | ICD-10-CM

## 2012-06-16 DIAGNOSIS — I82409 Acute embolism and thrombosis of unspecified deep veins of unspecified lower extremity: Secondary | ICD-10-CM | POA: Diagnosis not present

## 2012-06-16 DIAGNOSIS — Z7901 Long term (current) use of anticoagulants: Secondary | ICD-10-CM | POA: Diagnosis not present

## 2012-06-16 DIAGNOSIS — I2699 Other pulmonary embolism without acute cor pulmonale: Secondary | ICD-10-CM | POA: Diagnosis not present

## 2012-06-16 LAB — POCT INR: INR: 5.1

## 2012-06-25 ENCOUNTER — Ambulatory Visit (INDEPENDENT_AMBULATORY_CARE_PROVIDER_SITE_OTHER): Payer: Medicare Other | Admitting: *Deleted

## 2012-06-25 DIAGNOSIS — Z7901 Long term (current) use of anticoagulants: Secondary | ICD-10-CM | POA: Diagnosis not present

## 2012-06-25 DIAGNOSIS — I82409 Acute embolism and thrombosis of unspecified deep veins of unspecified lower extremity: Secondary | ICD-10-CM | POA: Diagnosis not present

## 2012-06-25 DIAGNOSIS — I4891 Unspecified atrial fibrillation: Secondary | ICD-10-CM

## 2012-06-25 DIAGNOSIS — I2699 Other pulmonary embolism without acute cor pulmonale: Secondary | ICD-10-CM

## 2012-06-25 LAB — POCT INR: INR: 3.2

## 2012-07-09 ENCOUNTER — Ambulatory Visit (INDEPENDENT_AMBULATORY_CARE_PROVIDER_SITE_OTHER): Payer: Medicare Other | Admitting: *Deleted

## 2012-07-09 DIAGNOSIS — Z7901 Long term (current) use of anticoagulants: Secondary | ICD-10-CM

## 2012-07-09 DIAGNOSIS — I82409 Acute embolism and thrombosis of unspecified deep veins of unspecified lower extremity: Secondary | ICD-10-CM | POA: Diagnosis not present

## 2012-07-09 DIAGNOSIS — I2699 Other pulmonary embolism without acute cor pulmonale: Secondary | ICD-10-CM

## 2012-07-09 DIAGNOSIS — I4891 Unspecified atrial fibrillation: Secondary | ICD-10-CM

## 2012-07-09 LAB — POCT INR: INR: 3.8

## 2012-07-17 ENCOUNTER — Encounter: Payer: Self-pay | Admitting: Internal Medicine

## 2012-07-17 ENCOUNTER — Ambulatory Visit (INDEPENDENT_AMBULATORY_CARE_PROVIDER_SITE_OTHER): Payer: Medicare Other | Admitting: Internal Medicine

## 2012-07-17 VITALS — BP 135/76 | HR 82 | Ht 68.0 in | Wt 219.0 lb

## 2012-07-17 DIAGNOSIS — I4891 Unspecified atrial fibrillation: Secondary | ICD-10-CM | POA: Diagnosis not present

## 2012-07-17 DIAGNOSIS — I10 Essential (primary) hypertension: Secondary | ICD-10-CM | POA: Diagnosis not present

## 2012-07-17 DIAGNOSIS — Z950 Presence of cardiac pacemaker: Secondary | ICD-10-CM

## 2012-07-17 LAB — PACEMAKER DEVICE OBSERVATION
BATTERY VOLTAGE: 2.71 V
BMOD-0003RV: 30
BMOD-0004RV: 5
BRDY-0002RV: 60 {beats}/min
BRDY-0004RV: 120 {beats}/min
RV LEAD AMPLITUDE: 8 mv
RV LEAD IMPEDENCE PM: 442 Ohm
RV LEAD THRESHOLD: 0.5 V
VENTRICULAR PACING PM: 41.6

## 2012-07-17 NOTE — Assessment & Plan Note (Signed)
Her ventricular rate appears to be well-controlled. She will continue her current medical therapy including anticoagulation with warfarin.

## 2012-07-17 NOTE — Assessment & Plan Note (Signed)
Her blood pressure is well controlled. No change in medical therapy. I've continued to encouraged the patient to maintain a low-sodium diet.

## 2012-07-17 NOTE — Patient Instructions (Addendum)
Follow up with Gunnar Fusi in 3 months   Your physician wants you to follow-up in: 1 year You will receive a reminder letter in the mail two months in advance. If you don't receive a letter, please call our office to schedule the follow-up appointment.

## 2012-07-17 NOTE — Assessment & Plan Note (Signed)
Her Medtronic single-chamber pacemaker is working normally. She is approximately one year of battery longevity.

## 2012-07-17 NOTE — Progress Notes (Signed)
HPI [name] returns today for followup. She is a very pleasant 79 year old woman with a history of symptomatic bradycardia, chronic atrial fibrillation, status post permanent pacemaker insertion secondary to bradycardia. In the interim, she has been stable. Despite her advanced age, she remains active. She denies chest pain or shortness of breath. No peripheral edema. Allergies  Allergen Reactions  . Bee Venom   . Codeine     REACTION: GI distress  . Penicillins   . Nutritional Supplements Swelling     Current Outpatient Prescriptions  Medication Sig Dispense Refill  . allopurinol (ZYLOPRIM) 300 MG tablet Take 1 tablet by mouth Daily.      Marland Kitchen aspirin (ASPIRIN EC) 81 MG EC tablet Take 81 mg by mouth daily.        . digoxin (LANOXIN) 0.125 MG tablet Take 1 tablet (125 mcg total) by mouth as directed. Take one-half by mouth daily  15 tablet  3  . diltiazem (CARDIZEM CD) 240 MG 24 hr capsule Take 240 mg by mouth daily.        Marland Kitchen gabapentin (NEURONTIN) 300 MG capsule Take 300 mg by mouth at bedtime.        . metoprolol (TOPROL-XL) 100 MG 24 hr tablet Take 100 mg by mouth Daily.       . naproxen (NAPROSYN) 500 MG tablet Take 500 mg by mouth 2 (two) times daily with a meal.      . warfarin (COUMADIN) 5 MG tablet Take 5 mg by mouth daily. As directed by anticoagulation clinic       No current facility-administered medications for this visit.     Past Medical History  Diagnosis Date  . Atrial fibrillation   . DVT (deep venous thrombosis)   . Pulmonary embolism     ROS:   All systems reviewed and negative except as noted in the HPI.   Past Surgical History  Procedure Laterality Date  . Insert / replace / remove pacemaker  09-13-1999  . Cardiac catheterization  2001  . Craniotomy clipping  06-2002    unruptured  . Left paraclinoid segment anurysum      [facility]     Family History  Problem Relation Age of Onset  . Heart attack Mother   . Heart attack  Father      History   Social History  . Marital Status: Widowed    Spouse Name: N/A    Number of Children: N/A  . Years of Education: N/A   Occupational History  . Not on file.   Social History Main Topics  . Smoking status: Former Smoker    Types: Cigarettes    Quit date: 05/26/2010  . Smokeless tobacco: Never Used  . Alcohol Use: No  . Drug Use: No  . Sexually Active: Not on file   Other Topics Concern  . Not on file   Social History Narrative  . No narrative on file     BP 135/76  Pulse 82  Ht 5\' 8"  (1.727 m)  Wt 219 lb (99.338 kg)  BMI 33.31 kg/m2  SpO2 98%  Physical Exam:  Well appearing elderly woman, NAD HEENT: Unremarkable Neck:  7 cm JVD, no thyromegally Lungs:  Clear with no wheezes, rales, or rhonchi. HEART:  Regular rate rhythm, no murmurs, no rubs, no clicks Abd:  soft, positive bowel sounds, no organomegally, no rebound, no guarding Ext:  2 plus pulses, no edema, no cyanosis, no clubbing Skin:  No rashes no  nodules Neuro:  CN II through XII intact, motor grossly intact  DEVICE  Normal device function.  See PaceArt for details.   Assess/Plan:

## 2012-07-21 ENCOUNTER — Encounter: Payer: Self-pay | Admitting: Internal Medicine

## 2012-07-30 ENCOUNTER — Ambulatory Visit (INDEPENDENT_AMBULATORY_CARE_PROVIDER_SITE_OTHER): Payer: Medicare Other | Admitting: *Deleted

## 2012-07-30 DIAGNOSIS — I2699 Other pulmonary embolism without acute cor pulmonale: Secondary | ICD-10-CM

## 2012-07-30 DIAGNOSIS — Z7901 Long term (current) use of anticoagulants: Secondary | ICD-10-CM | POA: Diagnosis not present

## 2012-07-30 DIAGNOSIS — I82409 Acute embolism and thrombosis of unspecified deep veins of unspecified lower extremity: Secondary | ICD-10-CM | POA: Diagnosis not present

## 2012-07-30 DIAGNOSIS — I4891 Unspecified atrial fibrillation: Secondary | ICD-10-CM

## 2012-07-30 LAB — POCT INR: INR: 2.3

## 2012-08-01 ENCOUNTER — Other Ambulatory Visit: Payer: Self-pay | Admitting: Cardiology

## 2012-08-01 ENCOUNTER — Telehealth: Payer: Self-pay | Admitting: Internal Medicine

## 2012-08-01 MED ORDER — DIGOXIN 125 MCG PO TABS: 125.0000 ug | ORAL_TABLET | ORAL | Status: DC

## 2012-08-01 NOTE — Telephone Encounter (Signed)
New problem   Pharmacy need clarification on prescription for Digoxin .125 for pt.

## 2012-08-08 DIAGNOSIS — I495 Sick sinus syndrome: Secondary | ICD-10-CM

## 2012-08-27 ENCOUNTER — Ambulatory Visit (INDEPENDENT_AMBULATORY_CARE_PROVIDER_SITE_OTHER): Payer: Medicare Other | Admitting: *Deleted

## 2012-08-27 DIAGNOSIS — I82409 Acute embolism and thrombosis of unspecified deep veins of unspecified lower extremity: Secondary | ICD-10-CM | POA: Diagnosis not present

## 2012-08-27 DIAGNOSIS — I2699 Other pulmonary embolism without acute cor pulmonale: Secondary | ICD-10-CM | POA: Diagnosis not present

## 2012-08-27 DIAGNOSIS — I4891 Unspecified atrial fibrillation: Secondary | ICD-10-CM

## 2012-08-27 DIAGNOSIS — Z7901 Long term (current) use of anticoagulants: Secondary | ICD-10-CM

## 2012-08-27 LAB — POCT INR: INR: 1.4

## 2012-09-10 DIAGNOSIS — I4891 Unspecified atrial fibrillation: Secondary | ICD-10-CM | POA: Diagnosis not present

## 2012-09-10 DIAGNOSIS — J449 Chronic obstructive pulmonary disease, unspecified: Secondary | ICD-10-CM | POA: Diagnosis not present

## 2012-09-10 DIAGNOSIS — M1990 Unspecified osteoarthritis, unspecified site: Secondary | ICD-10-CM | POA: Diagnosis not present

## 2012-09-10 DIAGNOSIS — I10 Essential (primary) hypertension: Secondary | ICD-10-CM | POA: Diagnosis not present

## 2012-09-11 ENCOUNTER — Ambulatory Visit (INDEPENDENT_AMBULATORY_CARE_PROVIDER_SITE_OTHER): Payer: Medicare Other | Admitting: *Deleted

## 2012-09-11 DIAGNOSIS — Z7901 Long term (current) use of anticoagulants: Secondary | ICD-10-CM | POA: Diagnosis not present

## 2012-09-11 DIAGNOSIS — I4891 Unspecified atrial fibrillation: Secondary | ICD-10-CM | POA: Diagnosis not present

## 2012-09-11 DIAGNOSIS — I82409 Acute embolism and thrombosis of unspecified deep veins of unspecified lower extremity: Secondary | ICD-10-CM | POA: Diagnosis not present

## 2012-09-11 DIAGNOSIS — I2699 Other pulmonary embolism without acute cor pulmonale: Secondary | ICD-10-CM

## 2012-09-11 LAB — POCT INR: INR: 2.3

## 2012-10-06 ENCOUNTER — Ambulatory Visit (INDEPENDENT_AMBULATORY_CARE_PROVIDER_SITE_OTHER): Payer: Medicare Other | Admitting: *Deleted

## 2012-10-06 DIAGNOSIS — I4891 Unspecified atrial fibrillation: Secondary | ICD-10-CM | POA: Diagnosis not present

## 2012-10-06 DIAGNOSIS — I2699 Other pulmonary embolism without acute cor pulmonale: Secondary | ICD-10-CM

## 2012-10-06 DIAGNOSIS — Z7901 Long term (current) use of anticoagulants: Secondary | ICD-10-CM

## 2012-10-06 DIAGNOSIS — I495 Sick sinus syndrome: Secondary | ICD-10-CM | POA: Diagnosis not present

## 2012-10-06 DIAGNOSIS — I82409 Acute embolism and thrombosis of unspecified deep veins of unspecified lower extremity: Secondary | ICD-10-CM

## 2012-10-06 LAB — PACEMAKER DEVICE OBSERVATION
BATTERY VOLTAGE: 2.68 V
BMOD-0003RV: 30
BMOD-0004RV: 5
BRDY-0002RV: 60 {beats}/min
BRDY-0004RV: 120 {beats}/min
RV LEAD AMPLITUDE: 11.2 mv
RV LEAD IMPEDENCE PM: 450 Ohm
RV LEAD THRESHOLD: 0.5 V

## 2012-10-06 LAB — POCT INR: INR: 1.4

## 2012-10-06 NOTE — Progress Notes (Signed)
PPM interrogation for battery data. 

## 2012-10-10 ENCOUNTER — Encounter: Payer: Self-pay | Admitting: Internal Medicine

## 2012-10-20 ENCOUNTER — Ambulatory Visit (INDEPENDENT_AMBULATORY_CARE_PROVIDER_SITE_OTHER): Payer: Medicare Other | Admitting: *Deleted

## 2012-10-20 DIAGNOSIS — I4891 Unspecified atrial fibrillation: Secondary | ICD-10-CM | POA: Diagnosis not present

## 2012-10-20 DIAGNOSIS — I2699 Other pulmonary embolism without acute cor pulmonale: Secondary | ICD-10-CM | POA: Diagnosis not present

## 2012-10-20 DIAGNOSIS — Z7901 Long term (current) use of anticoagulants: Secondary | ICD-10-CM

## 2012-10-20 DIAGNOSIS — I82409 Acute embolism and thrombosis of unspecified deep veins of unspecified lower extremity: Secondary | ICD-10-CM | POA: Diagnosis not present

## 2012-10-20 LAB — POCT INR: INR: 1.5

## 2012-11-10 DIAGNOSIS — I495 Sick sinus syndrome: Secondary | ICD-10-CM | POA: Diagnosis not present

## 2012-11-11 DIAGNOSIS — M545 Low back pain: Secondary | ICD-10-CM | POA: Diagnosis not present

## 2012-11-11 DIAGNOSIS — I10 Essential (primary) hypertension: Secondary | ICD-10-CM | POA: Diagnosis not present

## 2012-11-11 DIAGNOSIS — I4891 Unspecified atrial fibrillation: Secondary | ICD-10-CM | POA: Diagnosis not present

## 2012-11-11 DIAGNOSIS — J449 Chronic obstructive pulmonary disease, unspecified: Secondary | ICD-10-CM | POA: Diagnosis not present

## 2012-11-12 ENCOUNTER — Ambulatory Visit (INDEPENDENT_AMBULATORY_CARE_PROVIDER_SITE_OTHER): Payer: Medicare Other | Admitting: *Deleted

## 2012-11-12 DIAGNOSIS — I2699 Other pulmonary embolism without acute cor pulmonale: Secondary | ICD-10-CM

## 2012-11-12 DIAGNOSIS — I4891 Unspecified atrial fibrillation: Secondary | ICD-10-CM

## 2012-11-12 DIAGNOSIS — I82409 Acute embolism and thrombosis of unspecified deep veins of unspecified lower extremity: Secondary | ICD-10-CM | POA: Diagnosis not present

## 2012-11-12 DIAGNOSIS — Z7901 Long term (current) use of anticoagulants: Secondary | ICD-10-CM

## 2012-11-12 LAB — POCT INR: INR: 1.9

## 2012-11-25 ENCOUNTER — Encounter: Payer: Self-pay | Admitting: Internal Medicine

## 2012-11-28 ENCOUNTER — Other Ambulatory Visit (HOSPITAL_COMMUNITY): Payer: Self-pay | Admitting: Pulmonary Disease

## 2012-11-28 ENCOUNTER — Encounter: Payer: Self-pay | Admitting: Internal Medicine

## 2012-11-28 ENCOUNTER — Ambulatory Visit (INDEPENDENT_AMBULATORY_CARE_PROVIDER_SITE_OTHER): Payer: Medicare Other | Admitting: *Deleted

## 2012-11-28 ENCOUNTER — Ambulatory Visit (HOSPITAL_COMMUNITY)
Admission: RE | Admit: 2012-11-28 | Discharge: 2012-11-28 | Disposition: A | Discharge status: Home / Self Care | Payer: Medicare Other | Source: Ambulatory Visit | Attending: Pulmonary Disease | Admitting: Pulmonary Disease

## 2012-11-28 DIAGNOSIS — M545 Low back pain, unspecified: Secondary | ICD-10-CM | POA: Diagnosis not present

## 2012-11-28 DIAGNOSIS — Z9181 History of falling: Secondary | ICD-10-CM | POA: Diagnosis not present

## 2012-11-28 DIAGNOSIS — M25559 Pain in unspecified hip: Secondary | ICD-10-CM | POA: Diagnosis not present

## 2012-11-28 DIAGNOSIS — M25552 Pain in left hip: Secondary | ICD-10-CM

## 2012-11-28 DIAGNOSIS — I498 Other specified cardiac arrhythmias: Secondary | ICD-10-CM

## 2012-11-28 DIAGNOSIS — S79919A Unspecified injury of unspecified hip, initial encounter: Secondary | ICD-10-CM | POA: Diagnosis not present

## 2012-11-28 DIAGNOSIS — IMO0002 Reserved for concepts with insufficient information to code with codable children: Secondary | ICD-10-CM | POA: Diagnosis not present

## 2012-11-28 DIAGNOSIS — S79929A Unspecified injury of unspecified thigh, initial encounter: Secondary | ICD-10-CM | POA: Diagnosis not present

## 2012-11-28 LAB — PACEMAKER DEVICE OBSERVATION
BATTERY VOLTAGE: 2.65 V
BMOD-0003RV: 30
BMOD-0004RV: 5
BRDY-0002RV: 60 {beats}/min
BRDY-0004RV: 120 {beats}/min
RV LEAD IMPEDENCE PM: 552 Ohm
VENTRICULAR PACING PM: 43.2

## 2012-11-28 NOTE — Progress Notes (Signed)
PPM battery check only in office.

## 2012-12-11 DIAGNOSIS — I4891 Unspecified atrial fibrillation: Secondary | ICD-10-CM | POA: Diagnosis not present

## 2012-12-11 DIAGNOSIS — M1990 Unspecified osteoarthritis, unspecified site: Secondary | ICD-10-CM | POA: Diagnosis not present

## 2012-12-11 DIAGNOSIS — I10 Essential (primary) hypertension: Secondary | ICD-10-CM | POA: Diagnosis not present

## 2012-12-11 DIAGNOSIS — J449 Chronic obstructive pulmonary disease, unspecified: Secondary | ICD-10-CM | POA: Diagnosis not present

## 2012-12-18 ENCOUNTER — Ambulatory Visit (INDEPENDENT_AMBULATORY_CARE_PROVIDER_SITE_OTHER): Payer: Medicare Other | Admitting: *Deleted

## 2012-12-18 ENCOUNTER — Ambulatory Visit: Payer: Self-pay | Admitting: *Deleted

## 2012-12-18 ENCOUNTER — Telehealth: Payer: Self-pay | Admitting: Cardiology

## 2012-12-18 DIAGNOSIS — I4891 Unspecified atrial fibrillation: Secondary | ICD-10-CM | POA: Diagnosis not present

## 2012-12-18 DIAGNOSIS — I82409 Acute embolism and thrombosis of unspecified deep veins of unspecified lower extremity: Secondary | ICD-10-CM

## 2012-12-18 DIAGNOSIS — Z7901 Long term (current) use of anticoagulants: Secondary | ICD-10-CM

## 2012-12-18 DIAGNOSIS — I2699 Other pulmonary embolism without acute cor pulmonale: Secondary | ICD-10-CM | POA: Diagnosis not present

## 2012-12-18 LAB — POCT INR
INR: 4
INR: 4

## 2012-12-18 NOTE — Telephone Encounter (Signed)
Erroneous encounter

## 2013-01-01 ENCOUNTER — Ambulatory Visit (INDEPENDENT_AMBULATORY_CARE_PROVIDER_SITE_OTHER): Payer: Medicare Other | Admitting: *Deleted

## 2013-01-01 DIAGNOSIS — I82409 Acute embolism and thrombosis of unspecified deep veins of unspecified lower extremity: Secondary | ICD-10-CM

## 2013-01-01 DIAGNOSIS — Z7901 Long term (current) use of anticoagulants: Secondary | ICD-10-CM | POA: Diagnosis not present

## 2013-01-01 DIAGNOSIS — I4891 Unspecified atrial fibrillation: Secondary | ICD-10-CM

## 2013-01-01 DIAGNOSIS — I2699 Other pulmonary embolism without acute cor pulmonale: Secondary | ICD-10-CM | POA: Diagnosis not present

## 2013-01-01 LAB — POCT INR: INR: 2.4

## 2013-01-14 ENCOUNTER — Ambulatory Visit (INDEPENDENT_AMBULATORY_CARE_PROVIDER_SITE_OTHER): Payer: Medicare Other | Admitting: *Deleted

## 2013-01-14 DIAGNOSIS — I4891 Unspecified atrial fibrillation: Secondary | ICD-10-CM | POA: Diagnosis not present

## 2013-01-14 LAB — PACEMAKER DEVICE OBSERVATION
BATTERY VOLTAGE: 2.63 V
BMOD-0003RV: 30
BMOD-0004RV: 5
BRDY-0002RV: 60 {beats}/min
BRDY-0004RV: 120 {beats}/min
RV LEAD AMPLITUDE: 8 mv
RV LEAD IMPEDENCE PM: 432 Ohm
RV LEAD THRESHOLD: 0.5 V
VENTRICULAR PACING PM: 43.1

## 2013-01-14 NOTE — Progress Notes (Signed)
PPM check in office. 

## 2013-01-22 ENCOUNTER — Ambulatory Visit (INDEPENDENT_AMBULATORY_CARE_PROVIDER_SITE_OTHER): Payer: Medicare Other | Admitting: *Deleted

## 2013-01-22 DIAGNOSIS — Z7901 Long term (current) use of anticoagulants: Secondary | ICD-10-CM | POA: Diagnosis not present

## 2013-01-22 DIAGNOSIS — I2699 Other pulmonary embolism without acute cor pulmonale: Secondary | ICD-10-CM | POA: Diagnosis not present

## 2013-01-22 DIAGNOSIS — I4891 Unspecified atrial fibrillation: Secondary | ICD-10-CM | POA: Diagnosis not present

## 2013-01-22 DIAGNOSIS — I82409 Acute embolism and thrombosis of unspecified deep veins of unspecified lower extremity: Secondary | ICD-10-CM

## 2013-01-22 LAB — POCT INR: INR: 1.7

## 2013-02-03 ENCOUNTER — Encounter: Payer: Self-pay | Admitting: Internal Medicine

## 2013-02-10 ENCOUNTER — Emergency Department (HOSPITAL_COMMUNITY): Payer: Medicare Other

## 2013-02-10 ENCOUNTER — Encounter (HOSPITAL_COMMUNITY): Payer: Self-pay | Admitting: *Deleted

## 2013-02-10 ENCOUNTER — Emergency Department (HOSPITAL_COMMUNITY)
Admission: EM | Admit: 2013-02-10 | Discharge: 2013-02-10 | Disposition: A | Discharge status: Home / Self Care | Payer: Medicare Other | Attending: Emergency Medicine | Admitting: Emergency Medicine

## 2013-02-10 DIAGNOSIS — M545 Low back pain, unspecified: Secondary | ICD-10-CM | POA: Insufficient documentation

## 2013-02-10 DIAGNOSIS — M129 Arthropathy, unspecified: Secondary | ICD-10-CM | POA: Insufficient documentation

## 2013-02-10 DIAGNOSIS — M25569 Pain in unspecified knee: Secondary | ICD-10-CM | POA: Diagnosis not present

## 2013-02-10 DIAGNOSIS — M109 Gout, unspecified: Secondary | ICD-10-CM | POA: Diagnosis not present

## 2013-02-10 DIAGNOSIS — Z87891 Personal history of nicotine dependence: Secondary | ICD-10-CM | POA: Insufficient documentation

## 2013-02-10 DIAGNOSIS — Z880 Allergy status to penicillin: Secondary | ICD-10-CM | POA: Diagnosis not present

## 2013-02-10 DIAGNOSIS — M169 Osteoarthritis of hip, unspecified: Secondary | ICD-10-CM | POA: Diagnosis not present

## 2013-02-10 DIAGNOSIS — N390 Urinary tract infection, site not specified: Secondary | ICD-10-CM

## 2013-02-10 DIAGNOSIS — G8929 Other chronic pain: Secondary | ICD-10-CM

## 2013-02-10 DIAGNOSIS — Z86711 Personal history of pulmonary embolism: Secondary | ICD-10-CM | POA: Insufficient documentation

## 2013-02-10 DIAGNOSIS — Z79899 Other long term (current) drug therapy: Secondary | ICD-10-CM | POA: Diagnosis not present

## 2013-02-10 DIAGNOSIS — M1990 Unspecified osteoarthritis, unspecified site: Secondary | ICD-10-CM | POA: Diagnosis not present

## 2013-02-10 DIAGNOSIS — M25559 Pain in unspecified hip: Secondary | ICD-10-CM | POA: Diagnosis not present

## 2013-02-10 DIAGNOSIS — J449 Chronic obstructive pulmonary disease, unspecified: Secondary | ICD-10-CM | POA: Diagnosis not present

## 2013-02-10 DIAGNOSIS — Z7982 Long term (current) use of aspirin: Secondary | ICD-10-CM | POA: Diagnosis not present

## 2013-02-10 DIAGNOSIS — J40 Bronchitis, not specified as acute or chronic: Secondary | ICD-10-CM

## 2013-02-10 DIAGNOSIS — R791 Abnormal coagulation profile: Secondary | ICD-10-CM

## 2013-02-10 DIAGNOSIS — I4891 Unspecified atrial fibrillation: Secondary | ICD-10-CM | POA: Insufficient documentation

## 2013-02-10 DIAGNOSIS — Z86718 Personal history of other venous thrombosis and embolism: Secondary | ICD-10-CM | POA: Insufficient documentation

## 2013-02-10 DIAGNOSIS — Z791 Long term (current) use of non-steroidal anti-inflammatories (NSAID): Secondary | ICD-10-CM | POA: Diagnosis not present

## 2013-02-10 DIAGNOSIS — IMO0002 Reserved for concepts with insufficient information to code with codable children: Secondary | ICD-10-CM | POA: Diagnosis not present

## 2013-02-10 DIAGNOSIS — Z7901 Long term (current) use of anticoagulants: Secondary | ICD-10-CM | POA: Insufficient documentation

## 2013-02-10 DIAGNOSIS — S79919A Unspecified injury of unspecified hip, initial encounter: Secondary | ICD-10-CM | POA: Diagnosis not present

## 2013-02-10 DIAGNOSIS — J3489 Other specified disorders of nose and nasal sinuses: Secondary | ICD-10-CM | POA: Insufficient documentation

## 2013-02-10 DIAGNOSIS — R05 Cough: Secondary | ICD-10-CM | POA: Diagnosis not present

## 2013-02-10 DIAGNOSIS — S8990XA Unspecified injury of unspecified lower leg, initial encounter: Secondary | ICD-10-CM | POA: Diagnosis not present

## 2013-02-10 DIAGNOSIS — J4489 Other specified chronic obstructive pulmonary disease: Secondary | ICD-10-CM | POA: Insufficient documentation

## 2013-02-10 HISTORY — DX: Pain in left knee: M25.562

## 2013-02-10 HISTORY — DX: Pain in right knee: M25.561

## 2013-02-10 HISTORY — DX: Polyneuropathy, unspecified: G62.9

## 2013-02-10 HISTORY — DX: Other intervertebral disc degeneration, lumbar region: M51.36

## 2013-02-10 HISTORY — DX: Gout, unspecified: M10.9

## 2013-02-10 HISTORY — DX: Cerebral aneurysm, nonruptured: I67.1

## 2013-02-10 HISTORY — DX: Other intervertebral disc degeneration, lumbar region without mention of lumbar back pain or lower extremity pain: M51.369

## 2013-02-10 HISTORY — DX: Chronic obstructive pulmonary disease, unspecified: J44.9

## 2013-02-10 HISTORY — DX: Other chronic pain: G89.29

## 2013-02-10 HISTORY — DX: Unspecified osteoarthritis, unspecified site: M19.90

## 2013-02-10 LAB — BASIC METABOLIC PANEL
BUN: 21 mg/dL (ref 6–23)
CO2: 24 mEq/L (ref 19–32)
Calcium: 9.3 mg/dL (ref 8.4–10.5)
Chloride: 104 mEq/L (ref 96–112)
Creatinine, Ser: 1.08 mg/dL (ref 0.50–1.10)
GFR calc Af Amer: 53 mL/min — ABNORMAL LOW (ref >90)
GFR calc non Af Amer: 45 mL/min — ABNORMAL LOW (ref >90)
Glucose, Bld: 115 mg/dL — ABNORMAL HIGH (ref 70–99)
Potassium: 3.8 mEq/L (ref 3.5–5.1)
Sodium: 141 mEq/L (ref 135–145)

## 2013-02-10 LAB — CBC WITH DIFFERENTIAL/PLATELET
Basophils Absolute: 0 10*3/uL (ref 0.0–0.1)
Basophils Relative: 0 % (ref 0–1)
Eosinophils Absolute: 0.3 10*3/uL (ref 0.0–0.7)
Eosinophils Relative: 3 % (ref 0–5)
HCT: 39.8 % (ref 36.0–46.0)
Hemoglobin: 13.4 g/dL (ref 12.0–15.0)
Lymphocytes Relative: 18 % (ref 12–46)
Lymphs Abs: 1.5 10*3/uL (ref 0.7–4.0)
MCH: 33.3 pg (ref 26.0–34.0)
MCHC: 33.7 g/dL (ref 30.0–36.0)
MCV: 98.8 fL (ref 78.0–100.0)
Monocytes Absolute: 0.9 10*3/uL (ref 0.1–1.0)
Monocytes Relative: 10 % (ref 3–12)
Neutro Abs: 6 10*3/uL (ref 1.7–7.7)
Neutrophils Relative %: 69 % (ref 43–77)
Platelets: 159 10*3/uL (ref 150–400)
RBC: 4.03 MIL/uL (ref 3.87–5.11)
RDW: 14.3 % (ref 11.5–15.5)
WBC: 8.7 10*3/uL (ref 4.0–10.5)

## 2013-02-10 LAB — TROPONIN I: Troponin I: <0.3 ng/mL (ref <0.30)

## 2013-02-10 LAB — URINALYSIS W MICROSCOPIC + REFLEX CULTURE
Glucose, UA: 100 mg/dL — AB
Nitrite: POSITIVE — AB
Protein, ur: 30 mg/dL — AB
Specific Gravity, Urine: >1.03 — ABNORMAL HIGH (ref 1.005–1.030)
Urobilinogen, UA: >8 mg/dL — ABNORMAL HIGH (ref 0.0–1.0)
pH: 5.5 (ref 5.0–8.0)

## 2013-02-10 LAB — PROTIME-INR
INR: 1.44 (ref 0.00–1.49)
Prothrombin Time: 17.2 seconds — ABNORMAL HIGH (ref 11.6–15.2)

## 2013-02-10 LAB — DIGOXIN LEVEL: Digoxin Level: 0.3 ng/mL — ABNORMAL LOW (ref 0.8–2.0)

## 2013-02-10 MED ORDER — CEFDINIR 300 MG PO CAPS: 300.0000 mg | ORAL_CAPSULE | Freq: Two times a day (BID) | ORAL | Status: DC

## 2013-02-10 MED ORDER — ENOXAPARIN SODIUM 100 MG/ML ~~LOC~~ SOLN
100.0000 mg | Freq: Two times a day (BID) | SUBCUTANEOUS | Status: DC
  Administered 2013-02-10: 100 mg via SUBCUTANEOUS
  Filled 2013-02-10: qty 1

## 2013-02-10 MED ORDER — IPRATROPIUM BROMIDE 0.02 % IN SOLN
0.5000 mg | Freq: Once | RESPIRATORY_TRACT | Status: AC
  Administered 2013-02-10: 0.5 mg via RESPIRATORY_TRACT
  Filled 2013-02-10: qty 2.5

## 2013-02-10 MED ORDER — ALBUTEROL SULFATE (5 MG/ML) 0.5% IN NEBU
5.0000 mg | INHALATION_SOLUTION | Freq: Once | RESPIRATORY_TRACT | Status: AC
  Administered 2013-02-10: 5 mg via RESPIRATORY_TRACT
  Filled 2013-02-10: qty 1

## 2013-02-10 MED ORDER — LIDOCAINE HCL (PF) 1 % IJ SOLN
INTRAMUSCULAR | Status: AC
  Filled 2013-02-10: qty 5

## 2013-02-10 MED ORDER — ALBUTEROL SULFATE HFA 108 (90 BASE) MCG/ACT IN AERS
2.0000 | INHALATION_SPRAY | RESPIRATORY_TRACT | Status: AC
  Administered 2013-02-10: 2 via RESPIRATORY_TRACT
  Filled 2013-02-10: qty 6.7

## 2013-02-10 MED ORDER — CEFTRIAXONE SODIUM 1 G IJ SOLR
1.0000 g | Freq: Once | INTRAMUSCULAR | Status: AC
  Administered 2013-02-10: 1 g via INTRAMUSCULAR
  Filled 2013-02-10: qty 10

## 2013-02-10 NOTE — ED Notes (Signed)
Pt alert & oriented x4, stable gait. Patient  given discharge instructions, paperwork & prescription(s).  Patient verbalized understanding. Pt left department in wheelchair w/ no further questions. 

## 2013-02-10 NOTE — ED Notes (Signed)
RT in for breathing tx

## 2013-02-10 NOTE — ED Notes (Signed)
Patient unable to stand without assistance.  Family members state this is normal for this patient.

## 2013-02-10 NOTE — ED Notes (Signed)
Pt ambulated to restroom w/ staff by side & returned to room with no complications.

## 2013-02-10 NOTE — ED Notes (Addendum)
Cough and congestion x 2 days.  Denies fevers.  Fell on coccyx 3 weeks ago d/t weakness on L leg resulting in lower back pain and bilateral hip pain.  Family member relates patient has had multiple falls over last year.  There is some question as to her safety at home as well.

## 2013-02-10 NOTE — ED Provider Notes (Signed)
CSN: [id number]     Arrival date & time 02/10/13  1548 History   First MD Initiated Contact with Patient 02/10/13 1634     Chief Complaint  Patient presents with  . Cough  . Back Pain  . Hip Pain    HPI Pt was seen at 1715. Per pt and her family, c/o gradual onset and persistence of constant acute flair of her chronic left sided low back, hip and knee "pain" for the past several years, worse over the past month. Pt states her "knees just give way" and she "falls." States her last fall was last night. Pt denies hitting head, no LOC, no AMS, no syncope, no prodromal symptoms before fall, no focal motor weakness, no tingling/numbness in extremities, no CP/palpitations, no SOB, no abd pain, no N/V/D. Pt also c/o gradual onset and persistence of constant cough, runny/stuffy nose and sinus congestion for the past 2 to 3 days. Denies SOB/wheezing, no sore throat, no fevers, no rash.    Past Medical History  Diagnosis Date  . Atrial fibrillation   . DVT (deep venous thrombosis)   . Pulmonary embolism   . Brain aneurysm   . Arthritis   . Gout   . Peripheral neuropathy   . DJD (degenerative joint disease)   . DDD (degenerative disc disease), lumbar   . COPD (chronic obstructive pulmonary disease)   . Bilateral chronic knee pain    Past Surgical History  Procedure Laterality Date  . Insert / replace / remove pacemaker  09-13-1999  . Cardiac catheterization  2001  . Craniotomy clipping  06-2002    unruptured  . Left paraclinoid segment anurysum      [facility]   Family History  Problem Relation Age of Onset  . Heart attack Mother   . Heart attack Father    History  Substance Use Topics  . Smoking status: Former Smoker    Types: Cigarettes    Quit date: 05/26/2010  . Smokeless tobacco: Never Used  . Alcohol Use: No    Review of Systems ROS: Statement: All systems negative except as marked or noted in the HPI; Constitutional: Negative for fever and chills. ;  ; Eyes: Negative for eye pain, redness and discharge. ; ; ENMT: Negative for ear pain, hoarseness, sore throat. +nasal congestion, sinus pressure. ; ; Cardiovascular: Negative for chest pain, palpitations, diaphoresis, dyspnea and peripheral edema. ; ; Respiratory: +cough. Negative for wheezing and stridor. ; ; Gastrointestinal: Negative for nausea, vomiting, diarrhea, abdominal pain, blood in stool, hematemesis, jaundice and rectal bleeding. . ; ; Genitourinary: Negative for dysuria, flank pain and hematuria. ; ; Musculoskeletal: +LBP, left hip pain, left knee pain. Negative for neck pain. Negative for swelling and trauma.; ; Skin: Negative for pruritus, rash, abrasions, blisters, bruising and skin lesion.; ; Neuro: Negative for headache, lightheadedness and neck stiffness. Negative for weakness, altered level of consciousness , altered mental status, extremity weakness, paresthesias, involuntary movement, seizure and syncope.      Allergies  Bee venom; Codeine; Penicillins; and Nutritional supplements  Home Medications   Current Outpatient Rx  Name  Route  Sig  Dispense  Refill  . allopurinol (ZYLOPRIM) 300 MG tablet   Oral   Take 1 tablet by mouth Daily.         Marland Kitchen aspirin (ASPIRIN EC) 81 MG EC tablet   Oral   Take 81 mg by mouth daily.           . digoxin (  LANOXIN) 0.125 MG tablet   Oral   Take 1 tablet (125 mcg total) by mouth as directed. Take one-half by mouth daily   15 tablet   3   . diltiazem (CARDIZEM CD) 240 MG 24 hr capsule   Oral   Take 240 mg by mouth daily.           Marland Kitchen gabapentin (NEURONTIN) 300 MG capsule   Oral   Take 300 mg by mouth at bedtime.           . metoprolol (TOPROL-XL) 100 MG 24 hr tablet   Oral   Take 100 mg by mouth Daily.          . naproxen (NAPROSYN) 500 MG tablet   Oral   Take 500 mg by mouth 2 (two) times daily with a meal.         . warfarin (COUMADIN) 5 MG tablet   Oral   Take 5 mg by mouth daily. As directed by  anticoagulation clinic          BP 99/47  Pulse 94  Temp(Src) 97.4 F (36.3 C) (Oral)  Resp 18  Ht 5\' 9"  (1.753 m)  Wt 219 lb (99.338 kg)  BMI 32.33 kg/m2  SpO2 97% Physical Exam 1720: Physical examination:  Nursing notes reviewed; Vital signs and O2 SAT reviewed;  Constitutional: Well developed, Well nourished, Well hydrated, In no acute distress; Head:  Normocephalic, atraumatic; Eyes: EOMI, PERRL, No scleral icterus; ENMT: TM's clear bilat. +edemetous nasal turbinates bilat with clear rhinorrhea.  Mouth and pharynx normal, Mucous membranes moist; Neck: Supple, Full range of motion, No lymphadenopathy; Cardiovascular: Irregular irregular rate and rhythm, No gallop; Respiratory: Breath sounds clear & equal bilaterally, No rales, rhonchi, wheezes.  Speaking full sentences with ease, Normal respiratory effort/excursion; Chest: Nontender, Movement normal; Abdomen: Soft, Nontender, Nondistended, Normal bowel sounds; Genitourinary: No CVA tenderness; Spine:  No midline CS, TS, LS tenderness. +TTP left lower lumbar paraspinal muscles.;; Extremities: Pulses normal, No tenderness, No edema, No calf edema or asymmetry.; Neuro: AA&Ox3, Major CN grossly intact. No facial droop. Speech clear. Strength 5/5 equal bilat UE's and LE's. Climbs on and off stretcher easily by herself. Gait steady. No gross focal motor or sensory deficits in extremities.; Skin: Color normal, Warm, Dry.   ED Course  Procedures     MDM  MDM Reviewed: previous chart, nursing note and vitals Reviewed previous: labs, ECG and x-ray Interpretation: labs, ECG and x-ray       Date: 02/10/2013  Rate: 92  Rhythm: atrial fibrillation  QRS Axis: normal  Intervals: normal  ST/T Wave abnormalities: nonspecific ST/T changes  Conduction Disutrbances:none  Narrative Interpretation:   Old EKG Reviewed: unchanged; no significant changes from previous EKG dated 08/24/2010.   Results for orders placed during the hospital encounter  of 02/10/13  PROTIME-INR      Result Value Range   Prothrombin Time 17.2 (*) 11.6 - 15.2 seconds   INR 1.44  0.00 - 1.49  CBC WITH DIFFERENTIAL      Result Value Range   WBC 8.7  4.0 - 10.5 K/uL   RBC 4.03  3.87 - 5.11 MIL/uL   Hemoglobin 13.4  12.0 - 15.0 g/dL   HCT 96.0  45.4 - 09.8 %   MCV 98.8  78.0 - 100.0 fL   MCH 33.3  26.0 - 34.0 pg   MCHC 33.7  30.0 - 36.0 g/dL   RDW 11.9  14.7 - 82.9 %   Platelets 159  150 - 400 K/uL   Neutrophils Relative % 69  43 - 77 %   Neutro Abs 6.0  1.7 - 7.7 K/uL   Lymphocytes Relative 18  12 - 46 %   Lymphs Abs 1.5  0.7 - 4.0 K/uL   Monocytes Relative 10  3 - 12 %   Monocytes Absolute 0.9  0.1 - 1.0 K/uL   Eosinophils Relative 3  0 - 5 %   Eosinophils Absolute 0.3  0.0 - 0.7 K/uL   Basophils Relative 0  0 - 1 %   Basophils Absolute 0.0  0.0 - 0.1 K/uL  BASIC METABOLIC PANEL      Result Value Range   Sodium 141  135 - 145 mEq/L   Potassium 3.8  3.5 - 5.1 mEq/L   Chloride 104  96 - 112 mEq/L   CO2 24  19 - 32 mEq/L   Glucose, Bld 115 (*) 70 - 99 mg/dL   BUN 21  6 - 23 mg/dL   Creatinine, Ser 9.60  0.50 - 1.10 mg/dL   Calcium 9.3  8.4 - 45.4 mg/dL   GFR calc non Af Amer 45 (*) >90 mL/min   GFR calc Af Amer 53 (*) >90 mL/min  URINALYSIS W MICROSCOPIC + REFLEX CULTURE      Result Value Range   Color, Urine ORANGE (*) YELLOW   APPearance CLOUDY (*) CLEAR   Specific Gravity, Urine >1.030 (*) 1.005 - 1.030   pH 5.5  5.0 - 8.0   Glucose, UA 100 (*) NEGATIVE mg/dL   Hgb urine dipstick SMALL (*) NEGATIVE   Bilirubin Urine MODERATE (*) NEGATIVE   Ketones, ur TRACE (*) NEGATIVE mg/dL   Protein, ur 30 (*) NEGATIVE mg/dL   Urobilinogen, UA >0.9 (*) 0.0 - 1.0 mg/dL   Nitrite POSITIVE (*) NEGATIVE   Leukocytes, UA SMALL (*) NEGATIVE   WBC, UA TOO NUMEROUS TO COUNT  <3 WBC/hpf   RBC / HPF 0-2  <3 RBC/hpf   Bacteria, UA MANY (*) RARE  DIGOXIN LEVEL      Result Value Range   Digoxin Level 0.3 (*) 0.8 - 2.0 ng/mL  TROPONIN I      Result Value  Range   Troponin I <0.30  <0.30 ng/mL   Dg Chest 2 View 02/10/2013   *RADIOLOGY REPORT*  Clinical Data: Cough and congestion for 1 day, history of atrial fibrillation, pulmonary embolism, pacemaker, former smoker, initial encounter.  CHEST - 2 VIEW  Comparison: 09/01/2010; 04/26 of 2012  Findings: Grossly unchanged enlarged cardiac silhouette and mediastinal contours.  Stable position of support apparatus. Improved aeration of the lungs with persistent minimal bibasilar opacities, left greater than right, likely atelectasis.  The lungs appear mildly hyperexpanded.  No focal airspace opacity.  No definite pleural effusion or pneumothorax.  No evidence of edema. Grossly unchanged bones.  IMPRESSION: Mild lung hyperexpansion without acute cardiopulmonary disease.   Original Report Authenticated By: Tacey Ruiz, MD   Dg Lumbar Spine Complete 02/10/2013   *RADIOLOGY REPORT*  Clinical Data: Post fall, now with back pain  LUMBAR SPINE - COMPLETE 4+ VIEW  Comparison: 11/28/2012; 03/07/2012; 10/10/2037  Findings:  There are five non-rib bearing lumbar type vertebral bodies with diminutive ribs is seen bilaterally and T12.  There is grossly unchanged mild (approximately 4 mm) of retrolisthesis of L2 upon L3.  No definite anterolisthesis.  No definite pars defects given obliquity.  Lumbar vertebral body heights are preserved.  There is moderate to severe multilevel lumbar  spine DDD, likely worst at L2 - L3 and L3 - L4 with disc space height loss, end plate irregularity and sclerosis.  Limited visualization of the bilateral SI joints is normal.  Regional bowel gas pattern is normal.  Post cholecystectomy.  IMPRESSION: 1.  No acute findings. 2.  Grossly stable moderate to severe multilevel lumbar spine DDD.   Original Report Authenticated By: Tacey Ruiz, MD   Dg Hip Complete Left 02/10/2013   *RADIOLOGY REPORT*  Clinical Data: Post fall, now with left hip and back pain, initial encounter.  LEFT HIP - COMPLETE 2+ VIEW   Comparison: Left hip radiographs - 11/28/2012  Findings:  No definite fracture or dislocation.  Moderate to severe degenerative change of the left hip with joint space loss, subchondral sclerosis and osteophytosis.  No evidence of avascular necrosis.  There are SPECT advanced degenerative change of the contralateral right hip, incompletely evaluated.  No radiopaque foreign body.  IMPRESSION: 1.  No acute findings. 2.  Moderate to severe degenerative change of the left hip.   Original Report Authenticated By: Tacey Ruiz, MD   Dg Knee Complete 4 Views Left 02/10/2013   *RADIOLOGY REPORT*  Clinical Data: Post fall, now with left knee pain, initial encounter.  LEFT KNEE - COMPLETE 4+ VIEW  Comparison: None.  Findings:  No fracture or dislocation.  Mild to moderate tricompartmental degenerative change, likely worse with the medial compartment and patellofemoral joints with joint space loss, articular surface irregularity, sclerosis and osteophytosis.  There is minimal enthesopathic changes of the superior inferior poles of patella. No joint effusion.  No evidence of chondrocalcinosis.  Regional soft tissues are normal.  IMPRESSION: 1.  No acute findings. 2.  Mild to moderate tricompartmental degenerative change of the knee.   Original Report Authenticated By: Tacey Ruiz, MD    2000:  Pt has ambulated in the ED with steady gait, easy resps, denies any complaints. Family observing pt walk states pt's gait is "even better here now than when she's at home." Has tol PO well without N/V. Neb given for cough with significant improvement; will dose MDI for cough. INR subtherapeutic, will dose SQ lovenox. +UTI, UC pending, will dose IM rocephin, dx cefidinir. Pt states she "feels good" and wants to go home. Daughters state pt will be going home with them tonight. They will call pt's Cards MD tomorrow for coumadin dosing clarification and f/u INR check. Dx and testing d/w pt and family.  Questions answered.  Verb  understanding, agreeable to d/c home with outpt f/u.   Laray Anger, DO 02/13/13 1348

## 2013-02-10 NOTE — Progress Notes (Signed)
ANTICOAGULATION CONSULT NOTE - Initial Consult  Pharmacy Consult for Lovenox Indication: h/o dvt & subtherapeutic INR  Allergies  Allergen Reactions  . Bee Venom   . Codeine     REACTION: GI distress  . Penicillins   . Nutritional Supplements Swelling    Patient Measurements: Height: 5\' 9"  (175.3 cm) Weight: 219 lb (99.338 kg) IBW/kg (Calculated) : 66.2  Vital Signs: Temp: 97.4 F (36.3 C) (10/07 1550) Temp src: Oral (10/07 1550) BP: 99/47 mmHg (10/07 1945) Pulse Rate: 94 (10/07 1945)  Labs:  Recent Labs  02/10/13 1730 02/10/13 1805  HGB 13.4  --   HCT 39.8  --   PLT 159  --   LABPROT 17.2*  --   INR 1.44  --   CREATININE 1.08  --   TROPONINI  --  <0.30    Estimated Creatinine Clearance: 47.7 ml/min (by C-G formula based on Cr of 1.08).   Medical History: Past Medical History  Diagnosis Date  . Atrial fibrillation   . DVT (deep venous thrombosis)   . Pulmonary embolism   . Brain aneurysm   . Arthritis   . Gout   . Peripheral neuropathy   . DJD (degenerative joint disease)   . DDD (degenerative disc disease), lumbar   . COPD (chronic obstructive pulmonary disease)   . Bilateral chronic knee pain     Assessment: 79 yo female on chronic coumadin & INR subtherapeutic on admission.  Full dose Lovenox until INR at goal. Estimated Creatinine Clearance: 47.7 ml/min (by C-G formula based on Cr of 1.08).  Goal of Therapy:  INR 2-3 Monitor platelets by anticoagulation protocol:[id number]::"Monitor platelets by anticoagulation protocol: Yes"   Plan:  lovenox 1mg /kg sq q12h Monitor cbc  Margo Aye, Maelie Chriswell A 02/10/2013,8:26 PM

## 2013-02-12 ENCOUNTER — Telehealth: Payer: Self-pay | Admitting: *Deleted

## 2013-02-12 ENCOUNTER — Ambulatory Visit (INDEPENDENT_AMBULATORY_CARE_PROVIDER_SITE_OTHER): Payer: Medicare Other | Admitting: *Deleted

## 2013-02-12 DIAGNOSIS — Z7901 Long term (current) use of anticoagulants: Secondary | ICD-10-CM

## 2013-02-12 DIAGNOSIS — I2699 Other pulmonary embolism without acute cor pulmonale: Secondary | ICD-10-CM

## 2013-02-12 DIAGNOSIS — I82409 Acute embolism and thrombosis of unspecified deep veins of unspecified lower extremity: Secondary | ICD-10-CM

## 2013-02-12 DIAGNOSIS — I4891 Unspecified atrial fibrillation: Secondary | ICD-10-CM

## 2013-02-12 LAB — URINE CULTURE: Colony Count: >=100000

## 2013-02-12 NOTE — Telephone Encounter (Signed)
See coumadin note. 

## 2013-02-12 NOTE — Telephone Encounter (Signed)
Coumadin instructions / patient was seen at ED on 10/7 / patient is in [place] with [place] Niece due to fall / tgs

## 2013-02-25 ENCOUNTER — Ambulatory Visit (INDEPENDENT_AMBULATORY_CARE_PROVIDER_SITE_OTHER): Payer: Medicare Other | Admitting: *Deleted

## 2013-02-25 DIAGNOSIS — I82409 Acute embolism and thrombosis of unspecified deep veins of unspecified lower extremity: Secondary | ICD-10-CM

## 2013-02-25 DIAGNOSIS — I4891 Unspecified atrial fibrillation: Secondary | ICD-10-CM | POA: Diagnosis not present

## 2013-02-25 DIAGNOSIS — Z7901 Long term (current) use of anticoagulants: Secondary | ICD-10-CM | POA: Diagnosis not present

## 2013-02-25 DIAGNOSIS — I2699 Other pulmonary embolism without acute cor pulmonale: Secondary | ICD-10-CM | POA: Diagnosis not present

## 2013-02-25 LAB — POCT INR: INR: 3

## 2013-03-12 ENCOUNTER — Other Ambulatory Visit: Payer: Self-pay

## 2013-03-13 ENCOUNTER — Ambulatory Visit (INDEPENDENT_AMBULATORY_CARE_PROVIDER_SITE_OTHER): Payer: Medicare Other | Admitting: *Deleted

## 2013-03-13 DIAGNOSIS — I495 Sick sinus syndrome: Secondary | ICD-10-CM

## 2013-03-17 LAB — MDC_IDC_ENUM_SESS_TYPE_INCLINIC
Battery Impedance: 9748 Ohm
Battery Voltage: 2.55 V
Brady Statistic RV Percent Paced: 35.5 %
Lead Channel Setting Pacing Amplitude: 2.5 V
Lead Channel Setting Pacing Pulse Width: 0.4 ms
Lead Channel Setting Sensing Sensitivity: 2.8 mV

## 2013-03-18 ENCOUNTER — Encounter: Payer: Self-pay | Admitting: Internal Medicine

## 2013-03-18 ENCOUNTER — Ambulatory Visit (INDEPENDENT_AMBULATORY_CARE_PROVIDER_SITE_OTHER): Payer: Medicare Other | Admitting: *Deleted

## 2013-03-18 DIAGNOSIS — I495 Sick sinus syndrome: Secondary | ICD-10-CM

## 2013-03-18 DIAGNOSIS — I82409 Acute embolism and thrombosis of unspecified deep veins of unspecified lower extremity: Secondary | ICD-10-CM

## 2013-03-18 DIAGNOSIS — Z7901 Long term (current) use of anticoagulants: Secondary | ICD-10-CM

## 2013-03-18 DIAGNOSIS — I2699 Other pulmonary embolism without acute cor pulmonale: Secondary | ICD-10-CM | POA: Diagnosis not present

## 2013-03-18 DIAGNOSIS — I4891 Unspecified atrial fibrillation: Secondary | ICD-10-CM | POA: Diagnosis not present

## 2013-03-18 LAB — POCT INR: INR: 1.7

## 2013-03-19 ENCOUNTER — Encounter: Payer: Self-pay | Admitting: *Deleted

## 2013-03-19 ENCOUNTER — Ambulatory Visit (INDEPENDENT_AMBULATORY_CARE_PROVIDER_SITE_OTHER): Payer: Medicare Other | Admitting: Internal Medicine

## 2013-03-19 ENCOUNTER — Encounter: Payer: Self-pay | Admitting: Internal Medicine

## 2013-03-19 VITALS — BP 87/52 | HR 66 | Ht 69.0 in | Wt 212.4 lb

## 2013-03-19 DIAGNOSIS — I495 Sick sinus syndrome: Secondary | ICD-10-CM | POA: Diagnosis not present

## 2013-03-19 DIAGNOSIS — Z45018 Encounter for adjustment and management of other part of cardiac pacemaker: Secondary | ICD-10-CM

## 2013-03-19 DIAGNOSIS — Z950 Presence of cardiac pacemaker: Secondary | ICD-10-CM

## 2013-03-19 DIAGNOSIS — I10 Essential (primary) hypertension: Secondary | ICD-10-CM

## 2013-03-19 LAB — CBC WITH DIFFERENTIAL/PLATELET
Basophils Absolute: 0 10*3/uL (ref 0.0–0.1)
Basophils Relative: 0.5 % (ref 0.0–3.0)
Eosinophils Absolute: 0.2 10*3/uL (ref 0.0–0.7)
Eosinophils Relative: 1.9 % (ref 0.0–5.0)
HCT: 37.4 % (ref 36.0–46.0)
Hemoglobin: 12.8 g/dL (ref 12.0–15.0)
Lymphocytes Relative: 20 % (ref 12.0–46.0)
Lymphs Abs: 1.6 10*3/uL (ref 0.7–4.0)
MCHC: 34.2 g/dL (ref 30.0–36.0)
MCV: 97.4 fl (ref 78.0–100.0)
Monocytes Absolute: 0.6 10*3/uL (ref 0.1–1.0)
Monocytes Relative: 7.5 % (ref 3.0–12.0)
Neutro Abs: 5.7 10*3/uL (ref 1.4–7.7)
Neutrophils Relative %: 70.1 % (ref 43.0–77.0)
Platelets: 175 10*3/uL (ref 150.0–400.0)
RBC: 3.84 Mil/uL — ABNORMAL LOW (ref 3.87–5.11)
RDW: 15.7 % — ABNORMAL HIGH (ref 11.5–14.6)
WBC: 8.1 10*3/uL (ref 4.5–10.5)

## 2013-03-19 LAB — BASIC METABOLIC PANEL
BUN: 12 mg/dL (ref 6–23)
CO2: 28 mEq/L (ref 19–32)
Calcium: 8.9 mg/dL (ref 8.4–10.5)
Chloride: 102 mEq/L (ref 96–112)
Creatinine, Ser: 1.4 mg/dL — ABNORMAL HIGH (ref 0.4–1.2)
GFR: 37.96 mL/min — ABNORMAL LOW (ref >60.00)
Glucose, Bld: 90 mg/dL (ref 70–99)
Potassium: 4 mEq/L (ref 3.5–5.1)
Sodium: 140 mEq/L (ref 135–145)

## 2013-03-19 LAB — PROTIME-INR
INR: 2 ratio — ABNORMAL HIGH (ref 0.8–1.0)
Prothrombin Time: 20.6 s — ABNORMAL HIGH (ref 10.2–12.4)

## 2013-03-19 NOTE — Patient Instructions (Signed)
Need generator change for pacemaker  See instruction sheet

## 2013-03-19 NOTE — Assessment & Plan Note (Signed)
Her pacemaker is working normally but has reached elective replacement and reverted to the VVI mode. We will schedule generator change in the next week or 2.

## 2013-03-19 NOTE — Progress Notes (Signed)
HPI [name] returns today for followup. She is a very pleasant 79 year old woman with a history of symptomatic bradycardia, chronic atrial fibrillation, status post permanent pacemaker insertion secondary to bradycardia. In the interim, she has been stable. Despite her advanced age, she remains active. She denies chest pain or shortness of breath. No peripheral edema. Allergies  Allergen Reactions  . Bee Venom   . Codeine     REACTION: GI distress  . Penicillins   . Nutritional Supplements Swelling     Current Outpatient Prescriptions  Medication Sig Dispense Refill  . allopurinol (ZYLOPRIM) 300 MG tablet Take 1 tablet by mouth Daily.      Marland Kitchen aspirin (ASPIRIN EC) 81 MG EC tablet Take 81 mg by mouth daily.        . cefdinir (OMNICEF) 300 MG capsule Take 1 capsule (300 mg total) by mouth 2 (two) times daily. For the next 10 days  20 capsule  0  . digoxin (LANOXIN) 0.125 MG tablet Take 1 tablet (125 mcg total) by mouth as directed. Take one-half by mouth daily  15 tablet  3  . diltiazem (CARDIZEM CD) 240 MG 24 hr capsule Take 240 mg by mouth daily.        Marland Kitchen gabapentin (NEURONTIN) 300 MG capsule Take 300 mg by mouth at bedtime.        . metoprolol (TOPROL-XL) 100 MG 24 hr tablet Take 100 mg by mouth Daily.       . naproxen (NAPROSYN) 500 MG tablet Take 500 mg by mouth 2 (two) times daily with a meal.      . warfarin (COUMADIN) 5 MG tablet Take 5 mg by mouth daily. As directed by anticoagulation clinic       No current facility-administered medications for this visit.     Past Medical History  Diagnosis Date  . Atrial fibrillation   . DVT (deep venous thrombosis)   . Pulmonary embolism   . Brain aneurysm   . Arthritis   . Gout   . Peripheral neuropathy   . DJD (degenerative joint disease)   . DDD (degenerative disc disease), lumbar   . COPD (chronic obstructive pulmonary disease)   . Bilateral chronic knee pain     ROS:   All systems reviewed and negative except as noted in  the HPI.   Past Surgical History  Procedure Laterality Date  . Insert / replace / remove pacemaker  09-13-1999  . Cardiac catheterization  2001  . Craniotomy clipping  06-2002    unruptured  . Left paraclinoid segment anurysum      [facility]     Family History  Problem Relation Age of Onset  . Heart attack Mother   . Heart attack Father      History   Social History  . Marital Status: Widowed    Spouse Name: N/A    Number of Children: N/A  . Years of Education: N/A   Occupational History  . Not on file.   Social History Main Topics  . Smoking status: Former Smoker    Types: Cigarettes    Quit date: 05/26/2010  . Smokeless tobacco: Never Used  . Alcohol Use: No  . Drug Use: No  . Sexual Activity: Not on file   Other Topics Concern  . Not on file   Social History Narrative  . No narrative on file     BP 87/52  Pulse 66  Ht 5\' 9"  (1.753 m)  Wt  212 lb 6.4 oz (96.344 kg)  BMI 31.35 kg/m2  Physical Exam:  Well appearing elderly woman, NAD HEENT: Unremarkable Neck:  7 cm JVD, no thyromegally Lungs:  Clear with no wheezes, rales, or rhonchi. HEART:  Regular rate rhythm, no murmurs, no rubs, no clicks Abd:  soft, positive bowel sounds, no organomegally, no rebound, no guarding Ext:  2 plus pulses, no edema, no cyanosis, no clubbing Skin:  No rashes no nodules Neuro:  CN II through XII intact, motor grossly intact  DEVICE  Normal device function.  See PaceArt for details. Device at elective replacement.  Assess/Plan:

## 2013-03-19 NOTE — Assessment & Plan Note (Signed)
Her blood pressure today is on the low side but she is asymptomatic. She will continue her current medical therapy.

## 2013-03-23 ENCOUNTER — Telehealth: Payer: Self-pay | Admitting: Internal Medicine

## 2013-03-23 NOTE — Telephone Encounter (Signed)
New message     Having pacer Landscape architect.  According to the medication list we sent pt--pantopriazile 40mg  is listed but she does not talke this pill or anything that is 40mg .  Is this a new medication that she should be taking?

## 2013-03-23 NOTE — Telephone Encounter (Signed)
Spoke with patient and she does take that for her reflux.  She will discuss the others with Dr Ladona Ridgel at gen change

## 2013-03-25 ENCOUNTER — Encounter (HOSPITAL_COMMUNITY): Payer: Self-pay | Admitting: Pharmacy Technician

## 2013-03-25 DIAGNOSIS — J449 Chronic obstructive pulmonary disease, unspecified: Secondary | ICD-10-CM | POA: Diagnosis not present

## 2013-03-25 DIAGNOSIS — I4891 Unspecified atrial fibrillation: Secondary | ICD-10-CM | POA: Diagnosis not present

## 2013-03-25 DIAGNOSIS — Z7982 Long term (current) use of aspirin: Secondary | ICD-10-CM | POA: Diagnosis not present

## 2013-03-25 DIAGNOSIS — I495 Sick sinus syndrome: Secondary | ICD-10-CM | POA: Diagnosis not present

## 2013-03-25 DIAGNOSIS — Z87891 Personal history of nicotine dependence: Secondary | ICD-10-CM | POA: Diagnosis not present

## 2013-03-25 DIAGNOSIS — Z45018 Encounter for adjustment and management of other part of cardiac pacemaker: Secondary | ICD-10-CM | POA: Diagnosis not present

## 2013-03-25 MED ORDER — SODIUM CHLORIDE 0.9 % IR SOLN
80.0000 mg | Status: DC
  Filled 2013-03-25: qty 2

## 2013-03-25 MED ORDER — VANCOMYCIN HCL IN DEXTROSE 1-5 GM/200ML-% IV SOLN
1000.0000 mg | INTRAVENOUS | Status: DC
  Filled 2013-03-25: qty 200

## 2013-03-26 ENCOUNTER — Encounter (HOSPITAL_COMMUNITY)
Admission: RE | Disposition: A | Discharge status: Home / Self Care | Payer: Self-pay | Source: Ambulatory Visit | Attending: Internal Medicine

## 2013-03-26 ENCOUNTER — Ambulatory Visit (HOSPITAL_COMMUNITY)
Admission: RE | Admit: 2013-03-26 | Discharge: 2013-03-26 | Disposition: A | Discharge status: Home / Self Care | Payer: Medicare Other | Source: Ambulatory Visit | Attending: Internal Medicine | Admitting: Internal Medicine

## 2013-03-26 DIAGNOSIS — Z87891 Personal history of nicotine dependence: Secondary | ICD-10-CM | POA: Diagnosis not present

## 2013-03-26 DIAGNOSIS — I4891 Unspecified atrial fibrillation: Secondary | ICD-10-CM | POA: Insufficient documentation

## 2013-03-26 DIAGNOSIS — J449 Chronic obstructive pulmonary disease, unspecified: Secondary | ICD-10-CM | POA: Diagnosis not present

## 2013-03-26 DIAGNOSIS — I495 Sick sinus syndrome: Secondary | ICD-10-CM | POA: Diagnosis not present

## 2013-03-26 DIAGNOSIS — Z45018 Encounter for adjustment and management of other part of cardiac pacemaker: Secondary | ICD-10-CM | POA: Diagnosis not present

## 2013-03-26 DIAGNOSIS — J4489 Other specified chronic obstructive pulmonary disease: Secondary | ICD-10-CM | POA: Insufficient documentation

## 2013-03-26 DIAGNOSIS — Z7982 Long term (current) use of aspirin: Secondary | ICD-10-CM | POA: Insufficient documentation

## 2013-03-26 HISTORY — DX: Permanent atrial fibrillation: I48.21

## 2013-03-26 HISTORY — PX: PERMANENT PACEMAKER GENERATOR CHANGE: SHX6022

## 2013-03-26 LAB — SURGICAL PCR SCREEN
MRSA, PCR: POSITIVE — AB
Staphylococcus aureus: POSITIVE — AB

## 2013-03-26 LAB — PROTIME-INR
INR: 1.51 — ABNORMAL HIGH (ref 0.00–1.49)
Prothrombin Time: 17.8 seconds — ABNORMAL HIGH (ref 11.6–15.2)

## 2013-03-26 SURGERY — PERMANENT PACEMAKER GENERATOR CHANGE
Anesthesia: LOCAL

## 2013-03-26 MED ORDER — LIDOCAINE HCL (PF) 1 % IJ SOLN
INTRAMUSCULAR | Status: AC
  Filled 2013-03-26: qty 60

## 2013-03-26 MED ORDER — SODIUM CHLORIDE 0.9 % IV SOLN
INTRAVENOUS | Status: DC
  Administered 2013-03-26: 14:00:00 via INTRAVENOUS

## 2013-03-26 MED ORDER — FENTANYL CITRATE 0.05 MG/ML IJ SOLN
INTRAMUSCULAR | Status: AC
  Filled 2013-03-26: qty 2

## 2013-03-26 MED ORDER — MIDAZOLAM HCL 5 MG/5ML IJ SOLN
INTRAMUSCULAR | Status: AC
  Filled 2013-03-26: qty 5

## 2013-03-26 MED ORDER — MUPIROCIN 2 % EX OINT
TOPICAL_OINTMENT | CUTANEOUS | Status: AC
  Administered 2013-03-26: 1
  Filled 2013-03-26: qty 22

## 2013-03-26 MED ORDER — ACETAMINOPHEN 325 MG PO TABS: 325.0000 mg | ORAL_TABLET | ORAL | Status: DC | PRN

## 2013-03-26 MED ORDER — MUPIROCIN 2 % EX OINT
TOPICAL_OINTMENT | Freq: Two times a day (BID) | CUTANEOUS | Status: DC
  Filled 2013-03-26: qty 22

## 2013-03-26 MED ORDER — CHLORHEXIDINE GLUCONATE 4 % EX LIQD
60.0000 mL | Freq: Once | CUTANEOUS | Status: DC
  Filled 2013-03-26: qty 60

## 2013-03-26 MED ORDER — ONDANSETRON HCL 4 MG/2ML IJ SOLN: 4.0000 mg | Freq: Four times a day (QID) | INTRAMUSCULAR | Status: DC | PRN

## 2013-03-26 NOTE — CV Procedure (Signed)
VVI PM generator change without immediate complication. W#[id number].

## 2013-03-26 NOTE — Progress Notes (Signed)
Discharge instruction given to pt per MD order.  Pt denies any discomfort.  Noted a less than a dime size blood on post-op site dressing that is stable and has not progressed in the past hour.  Pt was able to verbalize understanding of instruction.  Pt to car with family in a wheelchair.

## 2013-03-26 NOTE — Progress Notes (Signed)
Pt areceived form cath procedure alert and able to tolerate food and fluid.

## 2013-03-26 NOTE — Interval H&P Note (Signed)
History and Physical Interval Note:  03/26/2013 2:46 PM  [name]  has presented today for surgery, with the diagnosis of eri  The various methods of treatment have been discussed with the patient and family. After consideration of risks, benefits and other options for treatment, the patient has consented to  Procedure(s): PERMANENT PACEMAKER GENERATOR CHANGE (N/A) as a surgical intervention .  The patient's history has been reviewed, patient examined, no change in status, stable for surgery.  I have reviewed the patient's chart and labs.  Questions were answered to the patient's satisfaction.     Lewayne Bunting

## 2013-03-26 NOTE — H&P (View-Only) (Signed)
HPI [name] returns today for followup. She is a very pleasant 79-year-old woman with a history of symptomatic bradycardia, chronic atrial fibrillation, status post permanent pacemaker insertion secondary to bradycardia. In the interim, she has been stable. Despite her advanced age, she remains active. She denies chest pain or shortness of breath. No peripheral edema. Allergies  Allergen Reactions  . Bee Venom   . Codeine     REACTION: GI distress  . Penicillins   . Nutritional Supplements Swelling     Current Outpatient Prescriptions  Medication Sig Dispense Refill  . allopurinol (ZYLOPRIM) 300 MG tablet Take 1 tablet by mouth Daily.      . aspirin (ASPIRIN EC) 81 MG EC tablet Take 81 mg by mouth daily.        . cefdinir (OMNICEF) 300 MG capsule Take 1 capsule (300 mg total) by mouth 2 (two) times daily. For the next 10 days  20 capsule  0  . digoxin (LANOXIN) 0.125 MG tablet Take 1 tablet (125 mcg total) by mouth as directed. Take one-half by mouth daily  15 tablet  3  . diltiazem (CARDIZEM CD) 240 MG 24 hr capsule Take 240 mg by mouth daily.        . gabapentin (NEURONTIN) 300 MG capsule Take 300 mg by mouth at bedtime.        . metoprolol (TOPROL-XL) 100 MG 24 hr tablet Take 100 mg by mouth Daily.       . naproxen (NAPROSYN) 500 MG tablet Take 500 mg by mouth 2 (two) times daily with a meal.      . warfarin (COUMADIN) 5 MG tablet Take 5 mg by mouth daily. As directed by anticoagulation clinic       No current facility-administered medications for this visit.     Past Medical History  Diagnosis Date  . Atrial fibrillation   . DVT (deep venous thrombosis)   . Pulmonary embolism   . Brain aneurysm   . Arthritis   . Gout   . Peripheral neuropathy   . DJD (degenerative joint disease)   . DDD (degenerative disc disease), lumbar   . COPD (chronic obstructive pulmonary disease)   . Bilateral chronic knee pain     ROS:   All systems reviewed and negative except as noted in  the HPI.   Past Surgical History  Procedure Laterality Date  . Insert / replace / remove pacemaker  09-13-1999  . Cardiac catheterization  2001  . Craniotomy clipping  06-2002    unruptured  . Left paraclinoid segment anurysum      [facility]     Family History  Problem Relation Age of Onset  . Heart attack Mother   . Heart attack Father      History   Social History  . Marital Status: Widowed    Spouse Name: N/A    Number of Children: N/A  . Years of Education: N/A   Occupational History  . Not on file.   Social History Main Topics  . Smoking status: Former Smoker    Types: Cigarettes    Quit date: 05/26/2010  . Smokeless tobacco: Never Used  . Alcohol Use: No  . Drug Use: No  . Sexual Activity: Not on file   Other Topics Concern  . Not on file   Social History Narrative  . No narrative on file     BP 87/52  Pulse 66  Ht 5' 9" (1.753 m)  Wt   212 lb 6.4 oz (96.344 kg)  BMI 31.35 kg/m2  Physical Exam:  Well appearing elderly woman, NAD HEENT: Unremarkable Neck:  7 cm JVD, no thyromegally Lungs:  Clear with no wheezes, rales, or rhonchi. HEART:  Regular rate rhythm, no murmurs, no rubs, no clicks Abd:  soft, positive bowel sounds, no organomegally, no rebound, no guarding Ext:  2 plus pulses, no edema, no cyanosis, no clubbing Skin:  No rashes no nodules Neuro:  CN II through XII intact, motor grossly intact  DEVICE  Normal device function.  See PaceArt for details. Device at elective replacement.  Assess/Plan: 

## 2013-03-27 ENCOUNTER — Encounter (HOSPITAL_COMMUNITY): Payer: Self-pay | Admitting: *Deleted

## 2013-03-27 NOTE — Op Note (Signed)
[name], Cross NO.:  [id number]  MEDICAL RECORD NO.:  [id number]  LOCATION:  [facility]                         FACILITY:  [facility]  PHYSICIAN:  Doylene Canning. Ladona Ridgel, MD    DATE OF BIRTH:  14-Jul-1925  DATE OF PROCEDURE:  03/26/2013 DATE OF DISCHARGE:  03/26/2013                              OPERATIVE REPORT   PROCEDURE PERFORMED:  Removal of a previously implanted dual-chamber pacemaker, which had reached elective replacement and insertion of a new single chamber pacemaker in a patient who has developed chronic atrial fibrillation and symptomatic tachy-brady syndrome with sinus node dysfunction.  INTRODUCTION:  The patient is an 79 year old woman with a history of symptomatic sinus node dysfunction who underwent permanent pacemaker insertion approximately 13 years ago.  She has developed chronic atrial fibrillation and is now referred for removal of her old device, which had reached elective replacement and insertion of a new device.  PROCEDURE:  After informed consent was obtained, the patient was taken to the diagnostic EP lab in a fasting state.  After usual preparation and draping, intravenous fentanyl and midazolam was given for sedation. 30 mL of lidocaine was infiltrated into the right infraclavicular region.  A 5-cm incision was carried out over this region and electrocautery was utilized to dissect down to the fascial plane.  The pacemaker pocket was entered and the generator removed with gentle traction.  The atrial lead was capped.  The ventricular lead was evaluated.  The R-waves were 6, the pacing impedance was nearly 400 ohms, and the threshold was 0.2 V at 0.5 milliseconds.  With these satisfactory parameters, the new Medtronic Cynthia single-chamber pacemaker, serial number [id number] was connected to the ventricular pacing lead and placed back in the subcutaneous pocket.  The atrial lead was capped in the pocket as well.  The pocket was irrigated  with antibiotic irrigation and the incision was closed with 2-0 and 3-0 Vicryl.  Benzoin and Steri-Strips were painted on the skin, pressure dressing was applied, and the patient was returned to her room in a satisfactory condition.  COMPLICATIONS:  There were no immediate procedure complications.  RESULTS:  This demonstrates successful implantation of a Medtronic single-chamber pacemaker after removal of previously implanted dual- chamber pacemaker, which had reached elective replacement.     Doylene Canning. Ladona Ridgel, MD     GWT/MEDQ  D:  03/26/2013  T:  03/27/2013  Job:  [id number]

## 2013-04-03 ENCOUNTER — Encounter: Payer: Self-pay | Admitting: Internal Medicine

## 2013-04-09 ENCOUNTER — Ambulatory Visit (INDEPENDENT_AMBULATORY_CARE_PROVIDER_SITE_OTHER): Payer: Medicare Other | Admitting: *Deleted

## 2013-04-09 DIAGNOSIS — I4891 Unspecified atrial fibrillation: Secondary | ICD-10-CM

## 2013-04-09 DIAGNOSIS — I82409 Acute embolism and thrombosis of unspecified deep veins of unspecified lower extremity: Secondary | ICD-10-CM

## 2013-04-09 DIAGNOSIS — I2699 Other pulmonary embolism without acute cor pulmonale: Secondary | ICD-10-CM | POA: Diagnosis not present

## 2013-04-09 DIAGNOSIS — Z7901 Long term (current) use of anticoagulants: Secondary | ICD-10-CM

## 2013-04-09 LAB — POCT INR: INR: 1.8

## 2013-04-10 ENCOUNTER — Ambulatory Visit (INDEPENDENT_AMBULATORY_CARE_PROVIDER_SITE_OTHER): Payer: Medicare Other | Admitting: *Deleted

## 2013-04-10 DIAGNOSIS — I4891 Unspecified atrial fibrillation: Secondary | ICD-10-CM | POA: Diagnosis not present

## 2013-04-10 LAB — MDC_IDC_ENUM_SESS_TYPE_INCLINIC
Battery Impedance: 100 Ohm
Battery Remaining Longevity: 138 mo
Battery Voltage: 2.8 V
Brady Statistic RV Percent Paced: 15 %
Date Time Interrogation Session: 20141205144529
Lead Channel Impedance Value: 437 Ohm
Lead Channel Pacing Threshold Amplitude: 0.5 V
Lead Channel Pacing Threshold Pulse Width: 0.4 ms
Lead Channel Sensing Intrinsic Amplitude: 8 mV
Lead Channel Setting Pacing Amplitude: 2 V
Lead Channel Setting Pacing Pulse Width: 0.4 ms
Lead Channel Setting Sensing Sensitivity: 2 mV

## 2013-04-10 NOTE — Progress Notes (Signed)
Wound check-PPM in office. 

## 2013-04-27 ENCOUNTER — Ambulatory Visit (INDEPENDENT_AMBULATORY_CARE_PROVIDER_SITE_OTHER): Payer: Medicare Other | Admitting: Pharmacist

## 2013-04-27 DIAGNOSIS — I4891 Unspecified atrial fibrillation: Secondary | ICD-10-CM

## 2013-04-27 DIAGNOSIS — I82409 Acute embolism and thrombosis of unspecified deep veins of unspecified lower extremity: Secondary | ICD-10-CM

## 2013-04-27 DIAGNOSIS — I2699 Other pulmonary embolism without acute cor pulmonale: Secondary | ICD-10-CM

## 2013-04-27 DIAGNOSIS — Z7901 Long term (current) use of anticoagulants: Secondary | ICD-10-CM | POA: Diagnosis not present

## 2013-04-27 LAB — POCT INR: INR: 4.6

## 2013-04-29 ENCOUNTER — Encounter: Payer: Self-pay | Admitting: Internal Medicine

## 2013-05-06 ENCOUNTER — Ambulatory Visit (INDEPENDENT_AMBULATORY_CARE_PROVIDER_SITE_OTHER): Payer: Medicare Other | Admitting: *Deleted

## 2013-05-06 DIAGNOSIS — Z7901 Long term (current) use of anticoagulants: Secondary | ICD-10-CM | POA: Diagnosis not present

## 2013-05-06 DIAGNOSIS — I82409 Acute embolism and thrombosis of unspecified deep veins of unspecified lower extremity: Secondary | ICD-10-CM | POA: Diagnosis not present

## 2013-05-06 DIAGNOSIS — I2699 Other pulmonary embolism without acute cor pulmonale: Secondary | ICD-10-CM

## 2013-05-06 DIAGNOSIS — I4891 Unspecified atrial fibrillation: Secondary | ICD-10-CM | POA: Diagnosis not present

## 2013-05-06 DIAGNOSIS — Z5181 Encounter for therapeutic drug level monitoring: Secondary | ICD-10-CM

## 2013-05-06 LAB — POCT INR: INR: 1.3

## 2013-05-20 ENCOUNTER — Encounter: Payer: Self-pay | Admitting: *Deleted

## 2013-05-27 ENCOUNTER — Ambulatory Visit (INDEPENDENT_AMBULATORY_CARE_PROVIDER_SITE_OTHER): Payer: Medicare Other | Admitting: *Deleted

## 2013-05-27 DIAGNOSIS — I4891 Unspecified atrial fibrillation: Secondary | ICD-10-CM

## 2013-05-27 DIAGNOSIS — I82409 Acute embolism and thrombosis of unspecified deep veins of unspecified lower extremity: Secondary | ICD-10-CM | POA: Diagnosis not present

## 2013-05-27 DIAGNOSIS — I2699 Other pulmonary embolism without acute cor pulmonale: Secondary | ICD-10-CM | POA: Diagnosis not present

## 2013-05-27 DIAGNOSIS — Z7901 Long term (current) use of anticoagulants: Secondary | ICD-10-CM | POA: Diagnosis not present

## 2013-05-27 LAB — POCT INR: INR: 2.2

## 2013-06-26 ENCOUNTER — Encounter: Payer: Medicare Other | Admitting: Internal Medicine

## 2013-07-09 ENCOUNTER — Ambulatory Visit (INDEPENDENT_AMBULATORY_CARE_PROVIDER_SITE_OTHER): Payer: Medicare Other | Admitting: *Deleted

## 2013-07-09 DIAGNOSIS — I4891 Unspecified atrial fibrillation: Secondary | ICD-10-CM

## 2013-07-09 DIAGNOSIS — Z7901 Long term (current) use of anticoagulants: Secondary | ICD-10-CM

## 2013-07-09 DIAGNOSIS — I2699 Other pulmonary embolism without acute cor pulmonale: Secondary | ICD-10-CM | POA: Diagnosis not present

## 2013-07-09 DIAGNOSIS — I82409 Acute embolism and thrombosis of unspecified deep veins of unspecified lower extremity: Secondary | ICD-10-CM | POA: Diagnosis not present

## 2013-07-09 LAB — POCT INR: INR: 1.8

## 2013-07-27 ENCOUNTER — Emergency Department (HOSPITAL_COMMUNITY)
Admission: EM | Admit: 2013-07-27 | Discharge: 2013-07-27 | Disposition: A | Discharge status: Home / Self Care | Payer: Medicare Other | Attending: Emergency Medicine | Admitting: Emergency Medicine

## 2013-07-27 ENCOUNTER — Encounter (HOSPITAL_COMMUNITY): Payer: Self-pay | Admitting: Emergency Medicine

## 2013-07-27 ENCOUNTER — Emergency Department (HOSPITAL_COMMUNITY): Payer: Medicare Other

## 2013-07-27 DIAGNOSIS — G8929 Other chronic pain: Secondary | ICD-10-CM | POA: Diagnosis not present

## 2013-07-27 DIAGNOSIS — M25559 Pain in unspecified hip: Secondary | ICD-10-CM | POA: Diagnosis not present

## 2013-07-27 DIAGNOSIS — S7010XA Contusion of unspecified thigh, initial encounter: Secondary | ICD-10-CM | POA: Diagnosis not present

## 2013-07-27 DIAGNOSIS — M7989 Other specified soft tissue disorders: Secondary | ICD-10-CM | POA: Diagnosis not present

## 2013-07-27 DIAGNOSIS — S0510XA Contusion of eyeball and orbital tissues, unspecified eye, initial encounter: Secondary | ICD-10-CM | POA: Diagnosis not present

## 2013-07-27 DIAGNOSIS — Y92009 Unspecified place in unspecified non-institutional (private) residence as the place of occurrence of the external cause: Secondary | ICD-10-CM | POA: Insufficient documentation

## 2013-07-27 DIAGNOSIS — Z79899 Other long term (current) drug therapy: Secondary | ICD-10-CM | POA: Diagnosis not present

## 2013-07-27 DIAGNOSIS — Z950 Presence of cardiac pacemaker: Secondary | ICD-10-CM | POA: Insufficient documentation

## 2013-07-27 DIAGNOSIS — M109 Gout, unspecified: Secondary | ICD-10-CM | POA: Diagnosis not present

## 2013-07-27 DIAGNOSIS — Z9889 Other specified postprocedural states: Secondary | ICD-10-CM | POA: Diagnosis not present

## 2013-07-27 DIAGNOSIS — D689 Coagulation defect, unspecified: Secondary | ICD-10-CM | POA: Diagnosis not present

## 2013-07-27 DIAGNOSIS — W19XXXA Unspecified fall, initial encounter: Secondary | ICD-10-CM

## 2013-07-27 DIAGNOSIS — Z8739 Personal history of other diseases of the musculoskeletal system and connective tissue: Secondary | ICD-10-CM | POA: Diagnosis not present

## 2013-07-27 DIAGNOSIS — J449 Chronic obstructive pulmonary disease, unspecified: Secondary | ICD-10-CM | POA: Diagnosis not present

## 2013-07-27 DIAGNOSIS — I4891 Unspecified atrial fibrillation: Secondary | ICD-10-CM | POA: Diagnosis not present

## 2013-07-27 DIAGNOSIS — Z791 Long term (current) use of non-steroidal anti-inflammatories (NSAID): Secondary | ICD-10-CM | POA: Diagnosis not present

## 2013-07-27 DIAGNOSIS — W1809XA Striking against other object with subsequent fall, initial encounter: Secondary | ICD-10-CM | POA: Insufficient documentation

## 2013-07-27 DIAGNOSIS — S79919A Unspecified injury of unspecified hip, initial encounter: Secondary | ICD-10-CM | POA: Diagnosis not present

## 2013-07-27 DIAGNOSIS — Z880 Allergy status to penicillin: Secondary | ICD-10-CM | POA: Diagnosis not present

## 2013-07-27 DIAGNOSIS — Z8669 Personal history of other diseases of the nervous system and sense organs: Secondary | ICD-10-CM | POA: Diagnosis not present

## 2013-07-27 DIAGNOSIS — Y9301 Activity, walking, marching and hiking: Secondary | ICD-10-CM | POA: Insufficient documentation

## 2013-07-27 DIAGNOSIS — Z7901 Long term (current) use of anticoagulants: Secondary | ICD-10-CM | POA: Diagnosis not present

## 2013-07-27 DIAGNOSIS — Z87891 Personal history of nicotine dependence: Secondary | ICD-10-CM | POA: Insufficient documentation

## 2013-07-27 DIAGNOSIS — D6832 Hemorrhagic disorder due to extrinsic circulating anticoagulants: Secondary | ICD-10-CM

## 2013-07-27 DIAGNOSIS — J4489 Other specified chronic obstructive pulmonary disease: Secondary | ICD-10-CM | POA: Insufficient documentation

## 2013-07-27 DIAGNOSIS — Z86711 Personal history of pulmonary embolism: Secondary | ICD-10-CM | POA: Diagnosis not present

## 2013-07-27 DIAGNOSIS — S7012XA Contusion of left thigh, initial encounter: Secondary | ICD-10-CM

## 2013-07-27 DIAGNOSIS — Z86718 Personal history of other venous thrombosis and embolism: Secondary | ICD-10-CM | POA: Diagnosis not present

## 2013-07-27 DIAGNOSIS — T45515A Adverse effect of anticoagulants, initial encounter: Secondary | ICD-10-CM | POA: Insufficient documentation

## 2013-07-27 LAB — CBC
HCT: 36.1 % (ref 36.0–46.0)
Hemoglobin: 12.1 g/dL (ref 12.0–15.0)
MCH: 32.7 pg (ref 26.0–34.0)
MCHC: 33.5 g/dL (ref 30.0–36.0)
MCV: 97.6 fL (ref 78.0–100.0)
Platelets: 179 10*3/uL (ref 150–400)
RBC: 3.7 MIL/uL — ABNORMAL LOW (ref 3.87–5.11)
RDW: 15.1 % (ref 11.5–15.5)
WBC: 6.6 10*3/uL (ref 4.0–10.5)

## 2013-07-27 LAB — PROTIME-INR
INR: 1.78 — ABNORMAL HIGH (ref 0.00–1.49)
Prothrombin Time: 20.2 seconds — ABNORMAL HIGH (ref 11.6–15.2)

## 2013-07-27 NOTE — ED Notes (Signed)
Pt c/o left hip pain since falling yesterday. Pt ambulatory in triage.

## 2013-07-27 NOTE — Discharge Instructions (Signed)
Ice packs to the bruised areas. You may seen bruising around your knee, ankle or foot as the blood starts drifting down your leg. Take acetaminophen for pain as needed. Your INR today is 1.78, call your coumadin clinic tomorrow.  Contusion A contusion is a deep bruise. Contusions are the result of an injury that caused bleeding under the skin. The contusion may turn blue, purple, or yellow. Minor injuries will give you a painless contusion, but more severe contusions may stay painful and swollen for a few weeks.  CAUSES  A contusion is usually caused by a blow, trauma, or direct force to an area of the body. SYMPTOMS   Swelling and redness of the injured area.  Bruising of the injured area.  Tenderness and soreness of the injured area.  Pain. DIAGNOSIS  The diagnosis can be made by taking a history and physical exam. An X-ray, CT scan, or MRI may be needed to determine if there were any associated injuries, such as fractures. TREATMENT  Specific treatment will depend on what area of the body was injured. In general, the best treatment for a contusion is resting, icing, elevating, and applying cold compresses to the injured area. Over-the-counter medicines may also be recommended for pain control. Ask your caregiver what the best treatment is for your contusion. HOME CARE INSTRUCTIONS   Put ice on the injured area.  Put ice in a plastic bag.  Place a towel between your skin and the bag.  Leave the ice on for 15-20 minutes, 03-04 times a day.  Only take over-the-counter or prescription medicines for pain, discomfort, or fever as directed by your caregiver. Your caregiver may recommend avoiding anti-inflammatory medicines (aspirin, ibuprofen, and naproxen) for 48 hours because these medicines may increase bruising.  Rest the injured area.  If possible, elevate the injured area to reduce swelling. SEEK IMMEDIATE MEDICAL CARE IF:   You have increased bruising or swelling.  You have  pain that is getting worse.  Your swelling or pain is not relieved with medicines. MAKE SURE YOU:   Understand these instructions.  Will watch your condition.  Will get help right away if you are not doing well or get worse. Document Released: 01/31/2005 Document Revised: 07/16/2011 Document Reviewed: 02/26/2011 [facility] Patient Information 2014 [place], [place].

## 2013-07-27 NOTE — ED Provider Notes (Signed)
CSN: [id number]     Arrival date & time 07/27/13  1413 History  This chart was scribed for Ward Givens, MD by Blanchard Kelch, ED Scribe. The patient was seen in room APA16A/APA16A. Patient's care was started at 4:04 PM.    Chief Complaint  Patient presents with  . Fall     Patient is a 80 y.o. female presenting with fall. The history is provided by the patient. No language interpreter was used.  Fall Pertinent negatives include no headaches.    HPI Comments: [name] is a 80 y.o. female who presents to the Emergency Department due to a fall that occurred in her house yesterday. She states that she was walking to the bathroom, tripped on a stepdown in her house and landed on her left hip. She reports hitting the top of her head when she fell. She denies losing consciousness. She is complaining of constant, mild pain with associated swelling to her left hip since the fall occurred. She is ambulatory post fall. She denies headache, nausea, vomiting, change in vision, numbness or tingling in extremities. She started noticing swelling with bruising in her left eye. She is currently taking Coumadin but has not taken her dose today. She has her INR checked every month. Her last lab draw was three weeks ago and she states the INR was at 3.1. She currently lives alone.  Her PCP is Dr. Juanetta Gosling.  Cardiologist Dr Ladona Ridgel   Past Medical History  Diagnosis Date  . Permanent atrial fibrillation     MDT [id number] pacemaker 03-26-2013 by Dr Ladona Ridgel  . DVT (deep venous thrombosis)   . Pulmonary embolism   . Brain aneurysm   . Arthritis   . Gout   . Peripheral neuropathy   . DJD (degenerative joint disease)   . DDD (degenerative disc disease), lumbar   . COPD (chronic obstructive pulmonary disease)   . Bilateral chronic knee pain    Past Surgical History  Procedure Laterality Date  . Pacemaker insertion  09-13-1999; 03-26-2013    PPM gen change by Dr Ladona Ridgel 03-26-2013 - MDT [id number]  . Cardiac  catheterization  2001  . Craniotomy clipping  06-2002    unruptured  . Left paraclinoid segment anurysum      [facility]   Family History  Problem Relation Age of Onset  . Heart attack Mother   . Heart attack Father    History  Substance Use Topics  . Smoking status: Former Smoker    Types: Cigarettes    Quit date: 05/26/2010  . Smokeless tobacco: Never Used  . Alcohol Use: No   Lives at home Lives alone Uses a cane  OB History   Grav Para Term Preterm Abortions TAB SAB Ect Mult Living                 Review of Systems  Gastrointestinal: Negative for nausea and vomiting.  Musculoskeletal: Positive for myalgias (left hip). Negative for gait problem.       Positive for swelling, left hip.  Neurological: Negative for numbness and headaches.  All other systems reviewed and are negative.      Allergies  Bee venom; Codeine; and Penicillins  Home Medications   Current Outpatient Rx  Name  Route  Sig  Dispense  Refill  . allopurinol (ZYLOPRIM) 300 MG tablet   Oral   Take 1 tablet by mouth Daily.         . digoxin (LANOXIN) 0.125 MG  tablet   Oral   Take 0.0625 mg by mouth daily.         Marland Kitchen. diltiazem (CARDIZEM CD) 240 MG 24 hr capsule   Oral   Take 240 mg by mouth daily.           . furosemide (LASIX) 40 MG tablet   Oral   Take 40 mg by mouth daily.         Marland Kitchen. gabapentin (NEURONTIN) 300 MG capsule   Oral   Take 300 mg by mouth at bedtime.           . metoprolol (TOPROL-XL) 100 MG 24 hr tablet   Oral   Take 100 mg by mouth Daily.          . naproxen (NAPROSYN) 500 MG tablet   Oral   Take 500 mg by mouth 2 (two) times daily with a meal.         . warfarin (COUMADIN) 5 MG tablet   Oral   Take 5 mg by mouth daily. As directed by anticoagulation clinic          Triage Vitals: BP 123/62  Pulse 87  Temp(Src) 97.5 F (36.4 C) (Oral)  Resp 12  SpO2 99%  Vital signs normal    Physical Exam  Nursing note and  vitals reviewed. Constitutional: She is oriented to person, place, and time. She appears well-developed and well-nourished.  Non-toxic appearance. She does not appear ill. No distress.  HENT:  Head: Normocephalic and atraumatic.  Right Ear: External ear normal.  Left Ear: External ear normal.  Nose: Nose normal. No mucosal edema or rhinorrhea.  Mouth/Throat: Oropharynx is clear and moist and mucous membranes are normal. No dental abscesses or uvula swelling.  Tender spot left upper scalp without bruising or swelling.  Eyes: Conjunctivae and EOM are normal. Pupils are equal, round, and reactive to light.  Neck: Normal range of motion and full passive range of motion without pain. Neck supple.  Cardiovascular: Normal rate, regular rhythm and normal heart sounds.  Exam reveals no gallop and no friction rub.   No murmur heard. Pulmonary/Chest: Effort normal and breath sounds normal. No respiratory distress. She has no wheezes. She has no rhonchi. She has no rales. She exhibits no tenderness and no crepitus.  Abdominal: Soft. Normal appearance and bowel sounds are normal. She exhibits no distension. There is no tenderness. There is no rebound and no guarding.  Musculoskeletal: Normal range of motion. She exhibits no edema and no tenderness.  Moves all extremities well. Lower extremities have no shortening, no internal rotation or external rotation. No pain with flexion of left knee or hip but patient states the area is "tight," most likely due to bruising (see photo below).  Neurological: She is alert and oriented to person, place, and time. She has normal strength. No cranial nerve deficit.  Skin: Skin is warm, dry and intact. Bruising (see photo) noted. No rash noted. No erythema. No pallor.  Psychiatric: She has a normal mood and affect. Her speech is normal and behavior is normal. Her mood appears not anxious.          ED Course  Procedures (including critical care time)  DIAGNOSTIC  STUDIES: Oxygen Saturation is 99% on room air, normal by my interpretation.    COORDINATION OF CARE: 4:35 PM -Will order Protime-INR and CBC. Patient verbalizes understanding and agrees with treatment plan.  6:40 PM -Updated patient on lab results. Will discharge. Return precautions were discussed. Patient  verbalizes understanding and agrees with treatment plan.   Medications - No data to display  Results for orders placed during the hospital encounter of 07/27/13  [facility]      Result Value Ref Range   Prothrombin Time 20.2 (*) 11.6 - 15.2 seconds   INR 1.78 (*) 0.00 - 1.49  CBC      Result Value Ref Range   WBC 6.6  4.0 - 10.5 K/uL   RBC 3.70 (*) 3.87 - 5.11 MIL/uL   Hemoglobin 12.1  12.0 - 15.0 g/dL   HCT 96.0  45.4 - 09.8 %   MCV 97.6  78.0 - 100.0 fL   MCH 32.7  26.0 - 34.0 pg   MCHC 33.5  30.0 - 36.0 g/dL   RDW 11.9  14.7 - 82.9 %   Platelets 179  150 - 400 K/uL   Laboratory interpretation all normal except subtherapeutic INR   Dg Hip Complete Left  07/27/2013   CLINICAL DATA:  Fall.  Left lateral hip pain and swelling.  EXAM: LEFT HIP - COMPLETE 2+ VIEW  COMPARISON:  DG HIP COMPLETE*L* dated 02/10/2013  FINDINGS: Severe degenerative arthropathy of both hips, similar to prior. No fracture identified. No significant observed change from the prior exam.  IMPRESSION: 1. Severe bilateral degenerative hip arthropathy. No conventional radiographic findings convincing for fracture.   Electronically Signed   By: Herbie Baltimore M.D.   On: 07/27/2013 14:49     EKG Interpretation None      MDM  patient fell and now has bruising to her thigh consistent with her being on Coumadin. She states she did bump her head however she has no concerning symptoms for intracranial injury such as headache, dizziness,  blurred vision. CT of head not done for that reason. Patient is alert, active, and talkative in no distress.    Final diagnoses:  Fall  Contusion of thigh, left   Warfarin-induced coagulopathy    Plan discharge  Devoria Albe, MD, FACEP   I personally performed the services described in this documentation, which was scribed in my presence. The recorded information has been reviewed and considered.  Devoria Albe, MD, FACEP    Ward Givens, MD 07/28/13 [phone]

## 2013-07-28 ENCOUNTER — Ambulatory Visit (HOSPITAL_COMMUNITY)
Admission: RE | Admit: 2013-07-28 | Discharge: 2013-07-28 | Disposition: A | Discharge status: Home / Self Care | Payer: Medicare Other | Source: Ambulatory Visit | Attending: Pulmonary Disease | Admitting: Pulmonary Disease

## 2013-07-28 ENCOUNTER — Ambulatory Visit (INDEPENDENT_AMBULATORY_CARE_PROVIDER_SITE_OTHER): Payer: Medicare Other | Admitting: *Deleted

## 2013-07-28 ENCOUNTER — Telehealth: Payer: Self-pay | Admitting: *Deleted

## 2013-07-28 ENCOUNTER — Other Ambulatory Visit (HOSPITAL_COMMUNITY): Payer: Self-pay | Admitting: Pulmonary Disease

## 2013-07-28 DIAGNOSIS — W19XXXA Unspecified fall, initial encounter: Secondary | ICD-10-CM | POA: Insufficient documentation

## 2013-07-28 DIAGNOSIS — R51 Headache: Secondary | ICD-10-CM | POA: Insufficient documentation

## 2013-07-28 DIAGNOSIS — Z7901 Long term (current) use of anticoagulants: Secondary | ICD-10-CM

## 2013-07-28 DIAGNOSIS — S0990XA Unspecified injury of head, initial encounter: Secondary | ICD-10-CM | POA: Diagnosis not present

## 2013-07-28 DIAGNOSIS — R519 Headache, unspecified: Secondary | ICD-10-CM

## 2013-07-28 DIAGNOSIS — I2699 Other pulmonary embolism without acute cor pulmonale: Secondary | ICD-10-CM

## 2013-07-28 DIAGNOSIS — I82409 Acute embolism and thrombosis of unspecified deep veins of unspecified lower extremity: Secondary | ICD-10-CM

## 2013-07-28 DIAGNOSIS — R200 Anesthesia of skin: Secondary | ICD-10-CM

## 2013-07-28 DIAGNOSIS — I4891 Unspecified atrial fibrillation: Secondary | ICD-10-CM

## 2013-07-28 NOTE — Telephone Encounter (Signed)
[facility] and [facility] checked [facility]R, can we call her with new instructions? She also has a follow up appointment for INR check on 07/30/13.

## 2013-07-28 NOTE — Telephone Encounter (Signed)
No answer x1

## 2013-07-29 NOTE — Telephone Encounter (Signed)
Spoke with pt.  States she has been taking coumadin.  Has appt for INR check tomorrow.  Pt states she will be able to come for appt.

## 2013-07-30 ENCOUNTER — Ambulatory Visit (INDEPENDENT_AMBULATORY_CARE_PROVIDER_SITE_OTHER): Payer: Medicare Other | Admitting: *Deleted

## 2013-07-30 DIAGNOSIS — I4891 Unspecified atrial fibrillation: Secondary | ICD-10-CM

## 2013-07-30 DIAGNOSIS — Z7901 Long term (current) use of anticoagulants: Secondary | ICD-10-CM

## 2013-07-30 DIAGNOSIS — I2699 Other pulmonary embolism without acute cor pulmonale: Secondary | ICD-10-CM | POA: Diagnosis not present

## 2013-07-30 DIAGNOSIS — I82409 Acute embolism and thrombosis of unspecified deep veins of unspecified lower extremity: Secondary | ICD-10-CM

## 2013-07-30 LAB — POCT INR: INR: 3.3

## 2013-08-03 ENCOUNTER — Encounter: Payer: Self-pay | Admitting: Internal Medicine

## 2013-08-03 ENCOUNTER — Ambulatory Visit (INDEPENDENT_AMBULATORY_CARE_PROVIDER_SITE_OTHER): Payer: Medicare Other | Admitting: Internal Medicine

## 2013-08-03 VITALS — BP 110/50 | HR 80 | Ht 68.0 in | Wt 207.0 lb

## 2013-08-03 DIAGNOSIS — Z7901 Long term (current) use of anticoagulants: Secondary | ICD-10-CM | POA: Diagnosis not present

## 2013-08-03 DIAGNOSIS — I4891 Unspecified atrial fibrillation: Secondary | ICD-10-CM

## 2013-08-03 DIAGNOSIS — I10 Essential (primary) hypertension: Secondary | ICD-10-CM

## 2013-08-03 LAB — MDC_IDC_ENUM_SESS_TYPE_INCLINIC
Battery Impedance: 133 Ohm
Battery Remaining Longevity: 125 mo
Battery Voltage: 2.8 V
Brady Statistic RV Percent Paced: 35 %
Date Time Interrogation Session: 20150330103818
Lead Channel Impedance Value: 0 Ohm
Lead Channel Impedance Value: 446 Ohm
Lead Channel Pacing Threshold Amplitude: 0.5 V
Lead Channel Pacing Threshold Pulse Width: 0.4 ms
Lead Channel Sensing Intrinsic Amplitude: 8 mV
Lead Channel Setting Pacing Amplitude: 2 V
Lead Channel Setting Pacing Pulse Width: 0.4 ms
Lead Channel Setting Sensing Sensitivity: 2.8 mV

## 2013-08-03 NOTE — Assessment & Plan Note (Signed)
Her ventricular rate is well controlled. She has fallen. I considered stopping her coumadin but would like to see how she does. If she continues to have problems with falls, she will have to stop her coumadin.

## 2013-08-03 NOTE — Assessment & Plan Note (Signed)
Her blood pressure is well controlled. She will continue her current meds.  

## 2013-08-03 NOTE — Progress Notes (Signed)
HPI [name] returns today for followup. She is a very pleasant 80 year old woman with a history of symptomatic bradycardia, chronic atrial fibrillation, status post permanent pacemaker insertion secondary to bradycardia. In the interim, she has been stable. Despite her advanced age, she remains active. She denies chest pain or shortness of breath. No peripheral edema. She is on chronic coumadin and notes that she fell in her house. She has "lots of stuff" and a cluttered house by her description. Allergies  Allergen Reactions  . Bee Venom Swelling  . Codeine     REACTION: GI distress  . Penicillins Other (See Comments)    Reaction unknown     Current Outpatient Prescriptions  Medication Sig Dispense Refill  . allopurinol (ZYLOPRIM) 300 MG tablet Take 1 tablet by mouth Daily.      . digoxin (LANOXIN) 0.125 MG tablet Take 0.0625 mg by mouth daily.      Marland Kitchen diltiazem (CARDIZEM CD) 240 MG 24 hr capsule Take 240 mg by mouth daily.        . furosemide (LASIX) 40 MG tablet Take 40 mg by mouth daily.      Marland Kitchen gabapentin (NEURONTIN) 300 MG capsule Take 300 mg by mouth at bedtime.        . metoprolol (TOPROL-XL) 100 MG 24 hr tablet Take 100 mg by mouth Daily.       . naproxen (NAPROSYN) 500 MG tablet Take 500 mg by mouth 2 (two) times daily with a meal.      . warfarin (COUMADIN) 5 MG tablet Take 5 mg by mouth daily. As directed by anticoagulation clinic       No current facility-administered medications for this visit.     Past Medical History  Diagnosis Date  . Permanent atrial fibrillation     MDT [id number] pacemaker 03-26-2013 by Dr Ladona Ridgel  . DVT (deep venous thrombosis)   . Pulmonary embolism   . Brain aneurysm   . Arthritis   . Gout   . Peripheral neuropathy   . DJD (degenerative joint disease)   . DDD (degenerative disc disease), lumbar   . COPD (chronic obstructive pulmonary disease)   . Bilateral chronic knee pain     ROS:   All systems reviewed and negative except as noted  in the HPI.   Past Surgical History  Procedure Laterality Date  . Pacemaker insertion  09-13-1999; 03-26-2013    PPM gen change by Dr Ladona Ridgel 03-26-2013 - MDT [id number]  . Cardiac catheterization  2001  . Craniotomy clipping  06-2002    unruptured  . Left paraclinoid segment anurysum      [facility]     Family History  Problem Relation Age of Onset  . Heart attack Mother   . Heart attack Father      History   Social History  . Marital Status: Widowed    Spouse Name: N/A    Number of Children: N/A  . Years of Education: N/A   Occupational History  . Not on file.   Social History Main Topics  . Smoking status: Former Smoker    Types: Cigarettes    Quit date: 05/26/2010  . Smokeless tobacco: Never Used  . Alcohol Use: No  . Drug Use: No  . Sexual Activity: Not on file   Other Topics Concern  . Not on file   Social History Narrative  . No narrative on file     BP 110/50  Pulse 80  Ht 5'  8" (1.727 m)  Wt 207 lb (93.895 kg)  BMI 31.48 kg/m2  Physical Exam:  Well appearing elderly woman, NAD HEENT: Unremarkable Neck:  7 cm JVD, no thyromegally Lungs:  Clear with no wheezes, rales, or rhonchi. HEART:  Regular rate rhythm, no murmurs, no rubs, no clicks Abd:  soft, positive bowel sounds, no organomegally, no rebound, no guarding Ext:  2 plus pulses, no edema, no cyanosis, no clubbing Skin:  No rashes no nodules Neuro:  CN II through XII intact, motor grossly intact  DEVICE  Normal device function.  See PaceArt for details. Device at elective replacement.  Assess/Plan:

## 2013-08-03 NOTE — Patient Instructions (Signed)
Your physician recommends that you schedule a follow-up appointment in: 1 year with Dr Taylor You will receive a reminder letter two months in advance reminding you to call and schedule your appointment. If you don't receive this letter, please contact our office.    

## 2013-08-03 NOTE — Assessment & Plan Note (Signed)
She will continue coumadin for now. If she continues to fall, then she will need to stop coumadin.

## 2013-08-13 ENCOUNTER — Ambulatory Visit (INDEPENDENT_AMBULATORY_CARE_PROVIDER_SITE_OTHER): Payer: Medicare Other | Admitting: *Deleted

## 2013-08-13 DIAGNOSIS — I82409 Acute embolism and thrombosis of unspecified deep veins of unspecified lower extremity: Secondary | ICD-10-CM | POA: Diagnosis not present

## 2013-08-13 DIAGNOSIS — I2699 Other pulmonary embolism without acute cor pulmonale: Secondary | ICD-10-CM

## 2013-08-13 DIAGNOSIS — Z5181 Encounter for therapeutic drug level monitoring: Secondary | ICD-10-CM | POA: Insufficient documentation

## 2013-08-13 DIAGNOSIS — I4891 Unspecified atrial fibrillation: Secondary | ICD-10-CM | POA: Diagnosis not present

## 2013-08-13 DIAGNOSIS — Z7901 Long term (current) use of anticoagulants: Secondary | ICD-10-CM

## 2013-08-13 LAB — POCT INR: INR: 1.8

## 2013-08-19 NOTE — Telephone Encounter (Signed)
error 

## 2013-09-03 ENCOUNTER — Ambulatory Visit (INDEPENDENT_AMBULATORY_CARE_PROVIDER_SITE_OTHER): Payer: Medicare Other | Admitting: *Deleted

## 2013-09-03 DIAGNOSIS — I4891 Unspecified atrial fibrillation: Secondary | ICD-10-CM | POA: Diagnosis not present

## 2013-09-03 DIAGNOSIS — I2699 Other pulmonary embolism without acute cor pulmonale: Secondary | ICD-10-CM | POA: Diagnosis not present

## 2013-09-03 DIAGNOSIS — Z5181 Encounter for therapeutic drug level monitoring: Secondary | ICD-10-CM

## 2013-09-03 DIAGNOSIS — Z7901 Long term (current) use of anticoagulants: Secondary | ICD-10-CM | POA: Diagnosis not present

## 2013-09-03 DIAGNOSIS — I82409 Acute embolism and thrombosis of unspecified deep veins of unspecified lower extremity: Secondary | ICD-10-CM | POA: Diagnosis not present

## 2013-09-03 LAB — POCT INR: INR: 3.9

## 2013-09-10 ENCOUNTER — Ambulatory Visit (INDEPENDENT_AMBULATORY_CARE_PROVIDER_SITE_OTHER): Payer: Medicare Other | Admitting: *Deleted

## 2013-09-10 DIAGNOSIS — Z5181 Encounter for therapeutic drug level monitoring: Secondary | ICD-10-CM

## 2013-09-10 DIAGNOSIS — Z7901 Long term (current) use of anticoagulants: Secondary | ICD-10-CM | POA: Diagnosis not present

## 2013-09-10 DIAGNOSIS — I2699 Other pulmonary embolism without acute cor pulmonale: Secondary | ICD-10-CM

## 2013-09-10 DIAGNOSIS — I82409 Acute embolism and thrombosis of unspecified deep veins of unspecified lower extremity: Secondary | ICD-10-CM | POA: Diagnosis not present

## 2013-09-10 DIAGNOSIS — I4891 Unspecified atrial fibrillation: Secondary | ICD-10-CM | POA: Diagnosis not present

## 2013-09-10 LAB — POCT INR: INR: 1.4

## 2013-09-23 ENCOUNTER — Ambulatory Visit (INDEPENDENT_AMBULATORY_CARE_PROVIDER_SITE_OTHER): Payer: Medicare Other | Admitting: *Deleted

## 2013-09-23 DIAGNOSIS — I2699 Other pulmonary embolism without acute cor pulmonale: Secondary | ICD-10-CM

## 2013-09-23 DIAGNOSIS — Z5181 Encounter for therapeutic drug level monitoring: Secondary | ICD-10-CM

## 2013-09-23 DIAGNOSIS — Z7901 Long term (current) use of anticoagulants: Secondary | ICD-10-CM

## 2013-09-23 DIAGNOSIS — I4891 Unspecified atrial fibrillation: Secondary | ICD-10-CM | POA: Diagnosis not present

## 2013-09-23 DIAGNOSIS — I82409 Acute embolism and thrombosis of unspecified deep veins of unspecified lower extremity: Secondary | ICD-10-CM | POA: Diagnosis not present

## 2013-09-23 LAB — POCT INR: INR: 1.8

## 2013-10-07 ENCOUNTER — Ambulatory Visit (INDEPENDENT_AMBULATORY_CARE_PROVIDER_SITE_OTHER): Payer: Medicare Other | Admitting: *Deleted

## 2013-10-07 DIAGNOSIS — I82409 Acute embolism and thrombosis of unspecified deep veins of unspecified lower extremity: Secondary | ICD-10-CM | POA: Diagnosis not present

## 2013-10-07 DIAGNOSIS — Z7901 Long term (current) use of anticoagulants: Secondary | ICD-10-CM | POA: Diagnosis not present

## 2013-10-07 DIAGNOSIS — I4891 Unspecified atrial fibrillation: Secondary | ICD-10-CM

## 2013-10-07 DIAGNOSIS — Z5181 Encounter for therapeutic drug level monitoring: Secondary | ICD-10-CM

## 2013-10-07 DIAGNOSIS — I2699 Other pulmonary embolism without acute cor pulmonale: Secondary | ICD-10-CM | POA: Diagnosis not present

## 2013-10-07 LAB — POCT INR: INR: 1.2

## 2013-10-19 ENCOUNTER — Ambulatory Visit (INDEPENDENT_AMBULATORY_CARE_PROVIDER_SITE_OTHER): Payer: Medicare Other | Admitting: *Deleted

## 2013-10-19 DIAGNOSIS — I2699 Other pulmonary embolism without acute cor pulmonale: Secondary | ICD-10-CM

## 2013-10-19 DIAGNOSIS — I4891 Unspecified atrial fibrillation: Secondary | ICD-10-CM

## 2013-10-19 DIAGNOSIS — I82409 Acute embolism and thrombosis of unspecified deep veins of unspecified lower extremity: Secondary | ICD-10-CM

## 2013-10-19 DIAGNOSIS — Z5181 Encounter for therapeutic drug level monitoring: Secondary | ICD-10-CM

## 2013-10-19 DIAGNOSIS — Z7901 Long term (current) use of anticoagulants: Secondary | ICD-10-CM

## 2013-10-19 LAB — POCT INR: INR: 4.9

## 2013-10-29 ENCOUNTER — Ambulatory Visit (INDEPENDENT_AMBULATORY_CARE_PROVIDER_SITE_OTHER): Payer: Medicare Other | Admitting: *Deleted

## 2013-10-29 DIAGNOSIS — I2699 Other pulmonary embolism without acute cor pulmonale: Secondary | ICD-10-CM | POA: Diagnosis not present

## 2013-10-29 DIAGNOSIS — Z7901 Long term (current) use of anticoagulants: Secondary | ICD-10-CM

## 2013-10-29 DIAGNOSIS — I82409 Acute embolism and thrombosis of unspecified deep veins of unspecified lower extremity: Secondary | ICD-10-CM

## 2013-10-29 DIAGNOSIS — Z5181 Encounter for therapeutic drug level monitoring: Secondary | ICD-10-CM

## 2013-10-29 DIAGNOSIS — I4891 Unspecified atrial fibrillation: Secondary | ICD-10-CM

## 2013-10-29 DIAGNOSIS — I482 Chronic atrial fibrillation, unspecified: Secondary | ICD-10-CM

## 2013-10-29 LAB — POCT INR: INR: 1.3

## 2013-11-04 ENCOUNTER — Ambulatory Visit (INDEPENDENT_AMBULATORY_CARE_PROVIDER_SITE_OTHER): Payer: Medicare Other | Admitting: *Deleted

## 2013-11-04 ENCOUNTER — Telehealth: Payer: Self-pay | Admitting: Cardiology

## 2013-11-04 ENCOUNTER — Encounter: Payer: Medicare Other | Admitting: *Deleted

## 2013-11-04 DIAGNOSIS — I4891 Unspecified atrial fibrillation: Secondary | ICD-10-CM | POA: Diagnosis not present

## 2013-11-04 DIAGNOSIS — Z7901 Long term (current) use of anticoagulants: Secondary | ICD-10-CM

## 2013-11-04 DIAGNOSIS — I82409 Acute embolism and thrombosis of unspecified deep veins of unspecified lower extremity: Secondary | ICD-10-CM | POA: Diagnosis not present

## 2013-11-04 DIAGNOSIS — I2699 Other pulmonary embolism without acute cor pulmonale: Secondary | ICD-10-CM | POA: Diagnosis not present

## 2013-11-04 DIAGNOSIS — Z5181 Encounter for therapeutic drug level monitoring: Secondary | ICD-10-CM

## 2013-11-04 DIAGNOSIS — I482 Chronic atrial fibrillation, unspecified: Secondary | ICD-10-CM

## 2013-11-04 LAB — POCT INR: INR: 2.8

## 2013-11-04 NOTE — Telephone Encounter (Signed)
Attempted to contact pt x1 and remind her of her remote transmission for her device that is due from home today.

## 2013-11-05 ENCOUNTER — Encounter: Payer: Self-pay | Admitting: Cardiology

## 2013-11-09 ENCOUNTER — Encounter: Payer: Self-pay | Admitting: Cardiology

## 2013-11-25 ENCOUNTER — Ambulatory Visit (INDEPENDENT_AMBULATORY_CARE_PROVIDER_SITE_OTHER): Payer: Medicare Other | Admitting: *Deleted

## 2013-11-25 DIAGNOSIS — I82409 Acute embolism and thrombosis of unspecified deep veins of unspecified lower extremity: Secondary | ICD-10-CM | POA: Diagnosis not present

## 2013-11-25 DIAGNOSIS — Z7901 Long term (current) use of anticoagulants: Secondary | ICD-10-CM

## 2013-11-25 DIAGNOSIS — I4891 Unspecified atrial fibrillation: Secondary | ICD-10-CM | POA: Diagnosis not present

## 2013-11-25 DIAGNOSIS — Z5181 Encounter for therapeutic drug level monitoring: Secondary | ICD-10-CM

## 2013-11-25 DIAGNOSIS — I2699 Other pulmonary embolism without acute cor pulmonale: Secondary | ICD-10-CM | POA: Diagnosis not present

## 2013-11-25 LAB — POCT INR: INR: 1.3

## 2013-12-09 ENCOUNTER — Ambulatory Visit (INDEPENDENT_AMBULATORY_CARE_PROVIDER_SITE_OTHER): Payer: Medicare Other | Admitting: *Deleted

## 2013-12-09 DIAGNOSIS — Z5181 Encounter for therapeutic drug level monitoring: Secondary | ICD-10-CM

## 2013-12-09 DIAGNOSIS — I82409 Acute embolism and thrombosis of unspecified deep veins of unspecified lower extremity: Secondary | ICD-10-CM

## 2013-12-09 DIAGNOSIS — I4891 Unspecified atrial fibrillation: Secondary | ICD-10-CM | POA: Diagnosis not present

## 2013-12-09 DIAGNOSIS — Z7901 Long term (current) use of anticoagulants: Secondary | ICD-10-CM

## 2013-12-09 DIAGNOSIS — I2699 Other pulmonary embolism without acute cor pulmonale: Secondary | ICD-10-CM | POA: Diagnosis not present

## 2013-12-09 LAB — POCT INR: INR: 1.4

## 2013-12-16 ENCOUNTER — Ambulatory Visit (INDEPENDENT_AMBULATORY_CARE_PROVIDER_SITE_OTHER): Payer: Medicare Other | Admitting: *Deleted

## 2013-12-16 DIAGNOSIS — I82409 Acute embolism and thrombosis of unspecified deep veins of unspecified lower extremity: Secondary | ICD-10-CM | POA: Diagnosis not present

## 2013-12-16 DIAGNOSIS — I2699 Other pulmonary embolism without acute cor pulmonale: Secondary | ICD-10-CM | POA: Diagnosis not present

## 2013-12-16 DIAGNOSIS — I4891 Unspecified atrial fibrillation: Secondary | ICD-10-CM | POA: Diagnosis not present

## 2013-12-16 DIAGNOSIS — Z7901 Long term (current) use of anticoagulants: Secondary | ICD-10-CM | POA: Diagnosis not present

## 2013-12-16 DIAGNOSIS — Z5181 Encounter for therapeutic drug level monitoring: Secondary | ICD-10-CM

## 2013-12-16 LAB — POCT INR: INR: 2.4

## 2013-12-30 ENCOUNTER — Ambulatory Visit (INDEPENDENT_AMBULATORY_CARE_PROVIDER_SITE_OTHER): Payer: Medicare Other | Admitting: *Deleted

## 2013-12-30 DIAGNOSIS — I2699 Other pulmonary embolism without acute cor pulmonale: Secondary | ICD-10-CM | POA: Diagnosis not present

## 2013-12-30 DIAGNOSIS — I4891 Unspecified atrial fibrillation: Secondary | ICD-10-CM

## 2013-12-30 DIAGNOSIS — Z7901 Long term (current) use of anticoagulants: Secondary | ICD-10-CM

## 2013-12-30 DIAGNOSIS — I82409 Acute embolism and thrombosis of unspecified deep veins of unspecified lower extremity: Secondary | ICD-10-CM | POA: Diagnosis not present

## 2013-12-30 DIAGNOSIS — Z5181 Encounter for therapeutic drug level monitoring: Secondary | ICD-10-CM

## 2013-12-30 LAB — POCT INR: INR: 1.3

## 2014-01-06 ENCOUNTER — Encounter: Payer: Self-pay | Admitting: *Deleted

## 2014-01-14 ENCOUNTER — Ambulatory Visit (INDEPENDENT_AMBULATORY_CARE_PROVIDER_SITE_OTHER): Payer: Medicare Other | Admitting: *Deleted

## 2014-01-14 DIAGNOSIS — I4891 Unspecified atrial fibrillation: Secondary | ICD-10-CM | POA: Diagnosis not present

## 2014-01-14 DIAGNOSIS — Z7901 Long term (current) use of anticoagulants: Secondary | ICD-10-CM | POA: Diagnosis not present

## 2014-01-14 DIAGNOSIS — I2699 Other pulmonary embolism without acute cor pulmonale: Secondary | ICD-10-CM | POA: Diagnosis not present

## 2014-01-14 DIAGNOSIS — Z5181 Encounter for therapeutic drug level monitoring: Secondary | ICD-10-CM

## 2014-01-14 DIAGNOSIS — I82409 Acute embolism and thrombosis of unspecified deep veins of unspecified lower extremity: Secondary | ICD-10-CM

## 2014-01-14 LAB — POCT INR: INR: 1.4

## 2014-01-27 ENCOUNTER — Ambulatory Visit (INDEPENDENT_AMBULATORY_CARE_PROVIDER_SITE_OTHER): Payer: Medicare Other | Admitting: *Deleted

## 2014-01-27 DIAGNOSIS — I82409 Acute embolism and thrombosis of unspecified deep veins of unspecified lower extremity: Secondary | ICD-10-CM | POA: Diagnosis not present

## 2014-01-27 DIAGNOSIS — Z7901 Long term (current) use of anticoagulants: Secondary | ICD-10-CM

## 2014-01-27 DIAGNOSIS — I2699 Other pulmonary embolism without acute cor pulmonale: Secondary | ICD-10-CM | POA: Diagnosis not present

## 2014-01-27 DIAGNOSIS — I4891 Unspecified atrial fibrillation: Secondary | ICD-10-CM | POA: Diagnosis not present

## 2014-01-27 DIAGNOSIS — Z5181 Encounter for therapeutic drug level monitoring: Secondary | ICD-10-CM

## 2014-01-27 LAB — POCT INR: INR: 2.3

## 2014-02-05 ENCOUNTER — Encounter: Payer: Self-pay | Admitting: Internal Medicine

## 2014-02-05 ENCOUNTER — Ambulatory Visit (INDEPENDENT_AMBULATORY_CARE_PROVIDER_SITE_OTHER): Payer: Medicare Other | Admitting: *Deleted

## 2014-02-05 DIAGNOSIS — I482 Chronic atrial fibrillation, unspecified: Secondary | ICD-10-CM

## 2014-02-05 LAB — MDC_IDC_ENUM_SESS_TYPE_INCLINIC
Battery Impedance: 157 Ohm
Battery Remaining Longevity: 120 mo
Battery Voltage: 2.78 V
Brady Statistic RV Percent Paced: 41 %
Date Time Interrogation Session: 20151002152938
Lead Channel Impedance Value: 0 Ohm
Lead Channel Impedance Value: 443 Ohm
Lead Channel Pacing Threshold Amplitude: 0.75 V
Lead Channel Pacing Threshold Pulse Width: 0.4 ms
Lead Channel Sensing Intrinsic Amplitude: 8 mV
Lead Channel Setting Pacing Amplitude: 2 V
Lead Channel Setting Pacing Pulse Width: 0.4 ms
Lead Channel Setting Sensing Sensitivity: 2.8 mV

## 2014-02-05 NOTE — Progress Notes (Signed)
PPM check in office. 

## 2014-02-10 ENCOUNTER — Ambulatory Visit (INDEPENDENT_AMBULATORY_CARE_PROVIDER_SITE_OTHER): Payer: Medicare Other | Admitting: Physician Assistant

## 2014-02-10 ENCOUNTER — Encounter (INDEPENDENT_AMBULATORY_CARE_PROVIDER_SITE_OTHER): Payer: Medicare Other

## 2014-02-10 ENCOUNTER — Encounter: Payer: Self-pay | Admitting: Physician Assistant

## 2014-02-10 ENCOUNTER — Encounter: Payer: Self-pay | Admitting: *Deleted

## 2014-02-10 VITALS — BP 113/60 | HR 65 | Ht 68.0 in | Wt 198.0 lb

## 2014-02-10 DIAGNOSIS — I4891 Unspecified atrial fibrillation: Secondary | ICD-10-CM | POA: Diagnosis not present

## 2014-02-10 DIAGNOSIS — I2699 Other pulmonary embolism without acute cor pulmonale: Secondary | ICD-10-CM

## 2014-02-10 DIAGNOSIS — I10 Essential (primary) hypertension: Secondary | ICD-10-CM | POA: Diagnosis not present

## 2014-02-10 DIAGNOSIS — I482 Chronic atrial fibrillation, unspecified: Secondary | ICD-10-CM

## 2014-02-10 DIAGNOSIS — I82409 Acute embolism and thrombosis of unspecified deep veins of unspecified lower extremity: Secondary | ICD-10-CM

## 2014-02-10 DIAGNOSIS — R079 Chest pain, unspecified: Secondary | ICD-10-CM | POA: Diagnosis not present

## 2014-02-10 DIAGNOSIS — R609 Edema, unspecified: Secondary | ICD-10-CM | POA: Insufficient documentation

## 2014-02-10 DIAGNOSIS — Z7901 Long term (current) use of anticoagulants: Secondary | ICD-10-CM

## 2014-02-10 NOTE — Patient Instructions (Signed)
Your physician recommends that you schedule a follow-up appointment in: 1 month with Dr. Ladona Ridgelaylor. Your physician recommends that you continue on your current medications as directed. Please refer to the Current Medication list given to you today. Your physician has requested that you have a lexiscan myoview. For further information please visit [email or web address]. Please follow instruction sheet, as given.

## 2014-02-10 NOTE — Assessment & Plan Note (Signed)
Blood pressure controlled. 

## 2014-02-10 NOTE — Progress Notes (Signed)
HPI: This is a very pleasant 80 year old female patient of Dr. Ladona Ridgel who has history of symptomatic bradycardia, chronic atrial fibrillation, status post pacemaker insertion secondary to bradycardia 03/26/13. She is on chronic Coumadin. She also has a history of pulmonary embolus and DVT. When Dr. Ladona Ridgel last saw her in March she had fallen and he considered stopping her Coumadin but wanted to see how she did. Last pacemaker check was 02/05/14 at which time she had atrial fib with 27.9% of total heartbeats greater than 100 beats per minute.  Patient comes in today complaining of several year history of chest tightness with shortness of breath on exertion. She says whenever she walks around her yard or works in her garden she notices it. She sits down and goes away. She did have to go under her house to check the water and had a more significant episode that did is with rest. She denies any palpitations associated with it, radiation of the pain, dizziness or presyncope. She hasn't had any more falls. She remains quite active. She went out to lunch at [employer] today and does eat a lot of salt. She has occasional swelling in her legs at the end of the day if she is on her feet a lot. The edema goes down overnight.  Allergies  Allergen Reactions  . Bee Venom Swelling  . Codeine     REACTION: GI distress  . Penicillins Other (See Comments)    Reaction unknown     Current Outpatient Prescriptions  Medication Sig Dispense Refill  . allopurinol (ZYLOPRIM) 300 MG tablet Take 1 tablet by mouth Daily.      . digoxin (LANOXIN) 0.125 MG tablet Take 0.0625 mg by mouth daily.      Marland Kitchen diltiazem (CARDIZEM CD) 240 MG 24 hr capsule Take 240 mg by mouth daily.        . furosemide (LASIX) 40 MG tablet Take 40 mg by mouth daily.      Marland Kitchen gabapentin (NEURONTIN) 300 MG capsule Take 300 mg by mouth at bedtime.        . metoprolol (TOPROL-XL) 100 MG 24 hr tablet Take 100 mg by mouth Daily.       .  naproxen (NAPROSYN) 500 MG tablet Take 500 mg by mouth 2 (two) times daily with a meal.      . pantoprazole (PROTONIX) 40 MG tablet Take 40 mg by mouth daily.      Marland Kitchen warfarin (COUMADIN) 5 MG tablet Take 5 mg by mouth daily. As directed by anticoagulation clinic       No current facility-administered medications for this visit.    Past Medical History  Diagnosis Date  . Permanent atrial fibrillation     MDT [id number] pacemaker 03-26-2013 by Dr Ladona Ridgel  . DVT (deep venous thrombosis)   . Pulmonary embolism   . Brain aneurysm   . Arthritis   . Gout   . Peripheral neuropathy   . DJD (degenerative joint disease)   . DDD (degenerative disc disease), lumbar   . COPD (chronic obstructive pulmonary disease)   . Bilateral chronic knee pain     Past Surgical History  Procedure Laterality Date  . Pacemaker insertion  09-13-1999; 03-26-2013    PPM gen change by Dr Ladona Ridgel 03-26-2013 - MDT [id number]  . Cardiac catheterization  2001  . Craniotomy clipping  06-2002    unruptured  . Left paraclinoid segment anurysum      [facility]  Family History  Problem Relation Age of Onset  . Heart attack Mother   . Heart attack Father     History   Social History  . Marital Status: Widowed    Spouse Name: N/A    Number of Children: N/A  . Years of Education: N/A   Occupational History  . Not on file.   Social History Main Topics  . Smoking status: Former Smoker    Types: Cigarettes    Quit date: 05/26/2010  . Smokeless tobacco: Never Used  . Alcohol Use: No  . Drug Use: No  . Sexual Activity: Not on file   Other Topics Concern  . Not on file   Social History Narrative  . No narrative on file    ROS: See history of present illness otherwise negative  BP 113/60  Pulse 65  Ht 5\' 8"  (1.727 m)  Wt 198 lb (89.812 kg)  BMI 30.11 kg/m2  PHYSICAL EXAM: Obese, in no acute distress. Neck: No JVD, HJR, Bruit, or thyroid enlargement  Lungs: No tachypnea, clear  without wheezing, rales, or rhonchi  Cardiovascular: Irregular irregular, PMI not displaced, heart sounds normal, no murmurs, gallops, bruit, thrill, or heave.  Abdomen: BS normal. Soft without organomegaly, masses, lesions or tenderness.  Extremities: Brawny changes of the lower extremities otherwise without cyanosis, clubbing or edema. Good distal pulses bilateral  SKin: Warm, no lesions or rashes   Musculoskeletal: No deformities  Neuro: no focal signs   Wt Readings from Last 3 Encounters:  08/03/13 207 lb (93.895 kg)  03/26/13 212 lb (96.163 kg)  03/26/13 212 lb (96.163 kg)     EKG: Paced rhythm with underlying atrial fibrillation and T wave inversion anterior lateral

## 2014-02-10 NOTE — Assessment & Plan Note (Signed)
Patient's most recent pacemaker check did show heart rates greater than 100 beats per minute 27.9% of the time. She is on Lanoxin 0.0625 mg daily diltiazem 240 mg daily and Toprol 100 mg daily. She did forget to take her medications today. I told her it's important to take her medications on a regular basis. I suspect this is why her heart rates been up on her pacer check. I am reluctant to increase these meds at this time.

## 2014-02-10 NOTE — Assessment & Plan Note (Signed)
Patient complains of edema at the end of the day of she's on her feet a lot. There is no evidence of swelling today. She does eat out a lot and enjoys her salt. Recommend 2 g sodium diet. Keep feet elevated.

## 2014-02-10 NOTE — Assessment & Plan Note (Signed)
Patient complains of several year history of exertional chest tightness associate with shortness of breath. She is very active for 88. Her recent pacemaker check did show heart rates over 100, 27.9% of the time. She did forget to take her medications this morning. I don't think this is what's causing her symptoms. Will check Lexi scan Myoview and have followup with Dr. Ladona Ridgelaylor.

## 2014-02-11 ENCOUNTER — Telehealth: Payer: Self-pay | Admitting: Adult Health

## 2014-02-11 DIAGNOSIS — I4891 Unspecified atrial fibrillation: Secondary | ICD-10-CM

## 2014-02-11 NOTE — Telephone Encounter (Signed)
Patient has questions about meds from yesterday's office visit / tgs

## 2014-02-11 NOTE — Telephone Encounter (Signed)
Pt wanted to clarify that she is not taking lasix and wanted to know if she needed to stay off it or start taking it again, she forgot to mention this at yesterdays office visit. Will forward to Herma CarsonMichelle Lenze, NP.

## 2014-02-15 ENCOUNTER — Ambulatory Visit (INDEPENDENT_AMBULATORY_CARE_PROVIDER_SITE_OTHER): Payer: Medicare Other | Admitting: *Deleted

## 2014-02-15 DIAGNOSIS — Z5181 Encounter for therapeutic drug level monitoring: Secondary | ICD-10-CM | POA: Diagnosis not present

## 2014-02-15 DIAGNOSIS — I4891 Unspecified atrial fibrillation: Secondary | ICD-10-CM

## 2014-02-15 LAB — POCT INR: INR: 1.4

## 2014-02-15 NOTE — Telephone Encounter (Signed)
Pt is not taking K. Will start taking lasix again. Orders in for BMP will have labs drawn next week. Please advise on K

## 2014-02-15 NOTE — Telephone Encounter (Signed)
Yes patient should go back on lasix. It may help her shortness of breath and edema. Does she take potassium with it? Check BMET next week

## 2014-02-18 ENCOUNTER — Encounter (HOSPITAL_COMMUNITY)
Admission: RE | Admit: 2014-02-18 | Discharge: 2014-02-18 | Disposition: A | Discharge status: Home / Self Care | Payer: Medicare Other | Source: Ambulatory Visit | Attending: Physician Assistant | Admitting: Physician Assistant

## 2014-02-18 ENCOUNTER — Encounter (HOSPITAL_COMMUNITY): Payer: Self-pay

## 2014-02-18 ENCOUNTER — Ambulatory Visit (HOSPITAL_COMMUNITY)
Admission: RE | Admit: 2014-02-18 | Discharge: 2014-02-18 | Disposition: A | Discharge status: Home / Self Care | Payer: Medicare Other | Source: Ambulatory Visit | Attending: Physician Assistant | Admitting: Physician Assistant

## 2014-02-18 DIAGNOSIS — R0609 Other forms of dyspnea: Secondary | ICD-10-CM | POA: Diagnosis not present

## 2014-02-18 DIAGNOSIS — I10 Essential (primary) hypertension: Secondary | ICD-10-CM | POA: Diagnosis not present

## 2014-02-18 DIAGNOSIS — I4891 Unspecified atrial fibrillation: Secondary | ICD-10-CM | POA: Diagnosis not present

## 2014-02-18 DIAGNOSIS — R079 Chest pain, unspecified: Secondary | ICD-10-CM | POA: Diagnosis not present

## 2014-02-18 MED ORDER — SODIUM CHLORIDE 0.9 % IJ SOLN
INTRAMUSCULAR | Status: AC
  Administered 2014-02-18: 10 mL via INTRAVENOUS
  Filled 2014-02-18: qty 10

## 2014-02-18 MED ORDER — REGADENOSON 0.4 MG/5ML IV SOLN
INTRAVENOUS | Status: AC
  Administered 2014-02-18: 0.4 mg via INTRAVENOUS
  Filled 2014-02-18: qty 5

## 2014-02-18 MED ORDER — REGADENOSON 0.4 MG/5ML IV SOLN
0.4000 mg | Freq: Once | INTRAVENOUS | Status: AC | PRN
  Administered 2014-02-18: 0.4 mg via INTRAVENOUS

## 2014-02-18 MED ORDER — TECHNETIUM TC 99M SESTAMIBI - CARDIOLITE
30.0000 | Freq: Once | INTRAVENOUS | Status: AC | PRN
  Administered 2014-02-18: 12:00:00 30 via INTRAVENOUS

## 2014-02-18 MED ORDER — TECHNETIUM TC 99M SESTAMIBI GENERIC - CARDIOLITE
10.0000 | Freq: Once | INTRAVENOUS | Status: AC | PRN
  Administered 2014-02-18: 10 via INTRAVENOUS

## 2014-02-18 MED ORDER — SODIUM CHLORIDE 0.9 % IJ SOLN
10.0000 mL | INTRAMUSCULAR | Status: DC | PRN
  Administered 2014-02-18: 10 mL via INTRAVENOUS

## 2014-02-18 NOTE — Progress Notes (Signed)
Stress Lab Nurses Notes - [name]  [name] 02/18/2014 Reason for doing test: chest pain, DOE & AFib Type of test: Marlane HatcherLexiscan  Cardiolite Nurse performing test: Parke PoissonPhyllis Billingsly, RN Nuclear Medicine Tech: Lyndel Pleasureyan Liles Echo Tech: Not Applicable MD performing test: Koneswaran/M.Geni BersLenze PA Family MD: Juanetta GoslingHawkins Test explained and consent signed: Yes.   IV started: Saline lock flushed, No redness or edema and Saline lock started in radiology Symptoms: none Treatment/Intervention: None Reason test stopped: protocol completed After recovery IV was: Discontinued via X-ray tech and No redness or edema Patient to return to Nuc. Med at : 12:15 Patient discharged: Home Patient's Condition upon discharge was: stable Comments: During test BP 113/77 & HR 122.  Recovery BP 121/83 & HR 96.  Symptoms resolved in recovery. Erskine SpeedBillingsley, Jacelyn Cuen T

## 2014-02-26 ENCOUNTER — Telehealth: Payer: Self-pay | Admitting: *Deleted

## 2014-02-26 NOTE — Telephone Encounter (Signed)
Pt is taking tylenol and I told her to avoid pseuphed products

## 2014-02-26 NOTE — Telephone Encounter (Signed)
PT WANTS TO KNOW WHAT KIND OF MEDICATION SHE CAN TAKE FOR A COLD/TMJ

## 2014-03-01 ENCOUNTER — Ambulatory Visit (INDEPENDENT_AMBULATORY_CARE_PROVIDER_SITE_OTHER): Payer: Medicare Other | Admitting: *Deleted

## 2014-03-01 DIAGNOSIS — I82409 Acute embolism and thrombosis of unspecified deep veins of unspecified lower extremity: Secondary | ICD-10-CM | POA: Diagnosis not present

## 2014-03-01 DIAGNOSIS — I4891 Unspecified atrial fibrillation: Secondary | ICD-10-CM

## 2014-03-01 DIAGNOSIS — Z7901 Long term (current) use of anticoagulants: Secondary | ICD-10-CM

## 2014-03-01 DIAGNOSIS — Z5181 Encounter for therapeutic drug level monitoring: Secondary | ICD-10-CM

## 2014-03-01 DIAGNOSIS — I2699 Other pulmonary embolism without acute cor pulmonale: Secondary | ICD-10-CM

## 2014-03-01 LAB — POCT INR: INR: 3.2

## 2014-03-20 DIAGNOSIS — R404 Transient alteration of awareness: Secondary | ICD-10-CM | POA: Diagnosis not present

## 2014-03-20 DIAGNOSIS — R531 Weakness: Secondary | ICD-10-CM | POA: Diagnosis not present

## 2014-03-22 ENCOUNTER — Encounter: Payer: Self-pay | Admitting: *Deleted

## 2014-03-24 DIAGNOSIS — Z23 Encounter for immunization: Secondary | ICD-10-CM | POA: Diagnosis not present

## 2014-03-24 DIAGNOSIS — S0001XA Abrasion of scalp, initial encounter: Secondary | ICD-10-CM | POA: Diagnosis not present

## 2014-03-31 ENCOUNTER — Encounter: Payer: Self-pay | Admitting: *Deleted

## 2014-04-05 ENCOUNTER — Ambulatory Visit (INDEPENDENT_AMBULATORY_CARE_PROVIDER_SITE_OTHER): Payer: Medicare Other | Admitting: *Deleted

## 2014-04-05 DIAGNOSIS — Z7901 Long term (current) use of anticoagulants: Secondary | ICD-10-CM

## 2014-04-05 DIAGNOSIS — I4891 Unspecified atrial fibrillation: Secondary | ICD-10-CM | POA: Diagnosis not present

## 2014-04-05 DIAGNOSIS — I82409 Acute embolism and thrombosis of unspecified deep veins of unspecified lower extremity: Secondary | ICD-10-CM | POA: Diagnosis not present

## 2014-04-05 DIAGNOSIS — Z5181 Encounter for therapeutic drug level monitoring: Secondary | ICD-10-CM | POA: Diagnosis not present

## 2014-04-05 DIAGNOSIS — I2699 Other pulmonary embolism without acute cor pulmonale: Secondary | ICD-10-CM

## 2014-04-05 LAB — POCT INR: INR: 1.2

## 2014-04-08 ENCOUNTER — Ambulatory Visit (INDEPENDENT_AMBULATORY_CARE_PROVIDER_SITE_OTHER): Payer: Medicare Other | Admitting: Internal Medicine

## 2014-04-08 ENCOUNTER — Encounter: Payer: Self-pay | Admitting: Internal Medicine

## 2014-04-08 ENCOUNTER — Telehealth: Payer: Self-pay | Admitting: *Deleted

## 2014-04-08 VITALS — BP 90/64 | HR 54 | Ht 68.0 in | Wt 198.0 lb

## 2014-04-08 DIAGNOSIS — Z950 Presence of cardiac pacemaker: Secondary | ICD-10-CM

## 2014-04-08 DIAGNOSIS — I480 Paroxysmal atrial fibrillation: Secondary | ICD-10-CM

## 2014-04-08 DIAGNOSIS — I10 Essential (primary) hypertension: Secondary | ICD-10-CM | POA: Diagnosis not present

## 2014-04-08 LAB — MDC_IDC_ENUM_SESS_TYPE_INCLINIC
Battery Impedance: 181 Ohm
Battery Remaining Longevity: 118 mo
Battery Voltage: 2.79 V
Brady Statistic RV Percent Paced: 36 %
Date Time Interrogation Session: 20151203152626
Lead Channel Impedance Value: 0 Ohm
Lead Channel Impedance Value: 443 Ohm
Lead Channel Pacing Threshold Amplitude: 0.5 V
Lead Channel Pacing Threshold Pulse Width: 0.4 ms
Lead Channel Sensing Intrinsic Amplitude: 5.6 mV
Lead Channel Setting Pacing Amplitude: 2.5 V
Lead Channel Setting Pacing Pulse Width: 0.4 ms
Lead Channel Setting Sensing Sensitivity: 2.8 mV

## 2014-04-08 MED ORDER — METOPROLOL SUCCINATE ER 100 MG PO TB24: 100.0000 mg | ORAL_TABLET | Freq: Every day | ORAL | Status: DC

## 2014-04-08 MED ORDER — FUROSEMIDE 40 MG PO TABS
40.0000 mg | ORAL_TABLET | Freq: Every day | ORAL | Status: DC
Start: 2014-04-08 — End: 2015-04-26

## 2014-04-08 NOTE — Assessment & Plan Note (Signed)
Her ventricular rate remains well controlled. Will follow. No change in meds.

## 2014-04-08 NOTE — Telephone Encounter (Signed)
Pt made aware to d/c torsemide and refilled lasix. Pt understood and will only take lasix.

## 2014-04-08 NOTE — Patient Instructions (Addendum)
Your physician wants you to follow-up in: 1 YEAR WITH DR TAYLOR You will receive a reminder letter in the mail two months in advance. If you don't receive a letter, please call our office to schedule the follow-up appointment.  Your physician recommends that you continue on your current medications as directed. Please refer to the Current Medication list given to you today.  Thank you for choosing [facility]!!   

## 2014-04-08 NOTE — Progress Notes (Signed)
HPI [name] returns today for followup. She is a very pleasant 80 year old woman with a history of symptomatic bradycardia, chronic atrial fibrillation, status post permanent pacemaker insertion secondary to bradycardia. In the interim, she has undergone a stress test which did not demonstrate any ischemia. She denies chest pain or shortness of breath. No peripheral edema. She is on chronic coumadin and notes that she fell in her house. She has "lots of stuff" and a cluttered house by her description.  Allergies  Allergen Reactions  . Bee Venom Swelling  . Codeine     REACTION: GI distress  . Penicillins Other (See Comments)    Reaction unknown     Current Outpatient Prescriptions  Medication Sig Dispense Refill  . allopurinol (ZYLOPRIM) 300 MG tablet Take 1 tablet by mouth Daily.    . digoxin (LANOXIN) 0.125 MG tablet Take 0.0625 mg by mouth daily.    Marland Kitchen. diltiazem (CARDIZEM CD) 240 MG 24 hr capsule Take 240 mg by mouth daily.      . furosemide (LASIX) 40 MG tablet Take 40 mg by mouth daily.    Marland Kitchen. gabapentin (NEURONTIN) 300 MG capsule Take 300 mg by mouth at bedtime.      . metoprolol (TOPROL-XL) 100 MG 24 hr tablet Take 100 mg by mouth Daily.     . mupirocin ointment (BACTROBAN) 2 %   5  . naproxen (NAPROSYN) 500 MG tablet Take 500 mg by mouth 2 (two) times daily with a meal.    . pantoprazole (PROTONIX) 40 MG tablet Take 40 mg by mouth daily.    Marland Kitchen. torsemide (DEMADEX) 100 MG tablet Take 50 mg by mouth daily.  12  . warfarin (COUMADIN) 5 MG tablet Take 5 mg by mouth daily. As directed by anticoagulation clinic     No current facility-administered medications for this visit.     Past Medical History  Diagnosis Date  . Permanent atrial fibrillation     MDT [id number] pacemaker 03-26-2013 by Dr Ladona Ridgelaylor  . DVT (deep venous thrombosis)   . Pulmonary embolism   . Brain aneurysm   . Arthritis   . Gout   . Peripheral neuropathy   . DJD (degenerative joint disease)   . DDD (degenerative  disc disease), lumbar   . COPD (chronic obstructive pulmonary disease)   . Bilateral chronic knee pain     ROS:   All systems reviewed and negative except as noted in the HPI.   Past Surgical History  Procedure Laterality Date  . Pacemaker insertion  09-13-1999; 03-26-2013    PPM gen change by Dr Ladona Ridgelaylor 03-26-2013 - MDT [id number]  . Cardiac catheterization  2001  . Craniotomy clipping  06-2002    unruptured  . Left paraclinoid segment anurysum      [facility]     Family History  Problem Relation Age of Onset  . Heart attack Mother   . Heart attack Father      History   Social History  . Marital Status: Widowed    Spouse Name: N/A    Number of Children: N/A  . Years of Education: N/A   Occupational History  . Not on file.   Social History Main Topics  . Smoking status: Former Smoker -- 0.25 packs/day for 35 years    Types: Cigarettes    Start date: 12/27/1977    Quit date: 05/26/2010  . Smokeless tobacco: Never Used  . Alcohol Use: No  . Drug Use: No  .  Sexual Activity: Not on file   Other Topics Concern  . Not on file   Social History Narrative  . No narrative on file     BP 90/64 mmHg  Pulse 54  Ht 5\' 8"  (1.727 m)  Wt 198 lb (89.812 kg)  BMI 30.11 kg/m2  Physical Exam:  Well appearing elderly woman, NAD HEENT: Unremarkable Neck:  7 cm JVD, no thyromegally Lungs:  Clear with no wheezes, rales, or rhonchi. HEART:  Regular rate rhythm, no murmurs, no rubs, no clicks Abd:  soft, positive bowel sounds, no organomegally, no rebound, no guarding Ext:  2 plus pulses, no edema, no cyanosis, no clubbing Skin:  No rashes no nodules Neuro:  CN II through XII intact, motor grossly intact  DEVICE  Normal device function.  See PaceArt for details.   Assess/Plan:

## 2014-04-08 NOTE — Assessment & Plan Note (Signed)
Her blood pressure is on the low side. Will follow. Will hold off on one of her diuretics.

## 2014-04-08 NOTE — Assessment & Plan Note (Signed)
Her medtronic VVI PM is working normally. Will recheck in several months. 

## 2014-04-09 ENCOUNTER — Encounter: Payer: Self-pay | Admitting: Internal Medicine

## 2014-04-09 LAB — PACEMAKER DEVICE OBSERVATION

## 2014-04-15 ENCOUNTER — Encounter (HOSPITAL_COMMUNITY): Payer: Self-pay | Admitting: Internal Medicine

## 2014-04-19 ENCOUNTER — Ambulatory Visit (INDEPENDENT_AMBULATORY_CARE_PROVIDER_SITE_OTHER): Payer: Medicare Other | Admitting: *Deleted

## 2014-04-19 DIAGNOSIS — Z7901 Long term (current) use of anticoagulants: Secondary | ICD-10-CM | POA: Diagnosis not present

## 2014-04-19 DIAGNOSIS — I2699 Other pulmonary embolism without acute cor pulmonale: Secondary | ICD-10-CM

## 2014-04-19 DIAGNOSIS — I82409 Acute embolism and thrombosis of unspecified deep veins of unspecified lower extremity: Secondary | ICD-10-CM

## 2014-04-19 DIAGNOSIS — Z5181 Encounter for therapeutic drug level monitoring: Secondary | ICD-10-CM | POA: Diagnosis not present

## 2014-04-19 DIAGNOSIS — I4891 Unspecified atrial fibrillation: Secondary | ICD-10-CM

## 2014-04-19 LAB — POCT INR: INR: 2.1

## 2014-05-10 ENCOUNTER — Encounter: Payer: Self-pay | Admitting: *Deleted

## 2014-05-17 ENCOUNTER — Ambulatory Visit (INDEPENDENT_AMBULATORY_CARE_PROVIDER_SITE_OTHER): Payer: Medicare Other | Admitting: *Deleted

## 2014-05-17 DIAGNOSIS — I4891 Unspecified atrial fibrillation: Secondary | ICD-10-CM | POA: Diagnosis not present

## 2014-05-17 DIAGNOSIS — I82409 Acute embolism and thrombosis of unspecified deep veins of unspecified lower extremity: Secondary | ICD-10-CM

## 2014-05-17 DIAGNOSIS — I2699 Other pulmonary embolism without acute cor pulmonale: Secondary | ICD-10-CM | POA: Diagnosis not present

## 2014-05-17 DIAGNOSIS — Z5181 Encounter for therapeutic drug level monitoring: Secondary | ICD-10-CM

## 2014-05-17 DIAGNOSIS — Z7901 Long term (current) use of anticoagulants: Secondary | ICD-10-CM

## 2014-05-17 LAB — POCT INR: INR: 1.2

## 2014-05-26 ENCOUNTER — Encounter: Payer: Self-pay | Admitting: *Deleted

## 2014-06-30 ENCOUNTER — Ambulatory Visit (INDEPENDENT_AMBULATORY_CARE_PROVIDER_SITE_OTHER): Payer: Medicare Other | Admitting: *Deleted

## 2014-06-30 DIAGNOSIS — Z5181 Encounter for therapeutic drug level monitoring: Secondary | ICD-10-CM | POA: Diagnosis not present

## 2014-06-30 DIAGNOSIS — I480 Paroxysmal atrial fibrillation: Secondary | ICD-10-CM | POA: Diagnosis not present

## 2014-06-30 DIAGNOSIS — I2699 Other pulmonary embolism without acute cor pulmonale: Secondary | ICD-10-CM

## 2014-06-30 DIAGNOSIS — I4891 Unspecified atrial fibrillation: Secondary | ICD-10-CM

## 2014-06-30 DIAGNOSIS — Z7901 Long term (current) use of anticoagulants: Secondary | ICD-10-CM | POA: Diagnosis not present

## 2014-06-30 DIAGNOSIS — I82409 Acute embolism and thrombosis of unspecified deep veins of unspecified lower extremity: Secondary | ICD-10-CM

## 2014-06-30 LAB — POCT INR: INR: 1.1

## 2014-07-07 ENCOUNTER — Ambulatory Visit (INDEPENDENT_AMBULATORY_CARE_PROVIDER_SITE_OTHER): Payer: Medicare Other | Admitting: *Deleted

## 2014-07-07 DIAGNOSIS — Z7901 Long term (current) use of anticoagulants: Secondary | ICD-10-CM | POA: Diagnosis not present

## 2014-07-07 DIAGNOSIS — I4891 Unspecified atrial fibrillation: Secondary | ICD-10-CM | POA: Diagnosis not present

## 2014-07-07 DIAGNOSIS — Z5181 Encounter for therapeutic drug level monitoring: Secondary | ICD-10-CM | POA: Diagnosis not present

## 2014-07-07 DIAGNOSIS — I2699 Other pulmonary embolism without acute cor pulmonale: Secondary | ICD-10-CM

## 2014-07-07 DIAGNOSIS — I82409 Acute embolism and thrombosis of unspecified deep veins of unspecified lower extremity: Secondary | ICD-10-CM | POA: Diagnosis not present

## 2014-07-07 DIAGNOSIS — I480 Paroxysmal atrial fibrillation: Secondary | ICD-10-CM | POA: Diagnosis not present

## 2014-07-07 LAB — POCT INR: INR: 5.8

## 2014-07-19 ENCOUNTER — Encounter: Payer: Self-pay | Admitting: *Deleted

## 2014-08-09 ENCOUNTER — Ambulatory Visit (INDEPENDENT_AMBULATORY_CARE_PROVIDER_SITE_OTHER): Payer: Medicare Other | Admitting: *Deleted

## 2014-08-09 DIAGNOSIS — Z7901 Long term (current) use of anticoagulants: Secondary | ICD-10-CM | POA: Diagnosis not present

## 2014-08-09 DIAGNOSIS — I2699 Other pulmonary embolism without acute cor pulmonale: Secondary | ICD-10-CM | POA: Diagnosis not present

## 2014-08-09 DIAGNOSIS — I4891 Unspecified atrial fibrillation: Secondary | ICD-10-CM | POA: Diagnosis not present

## 2014-08-09 DIAGNOSIS — Z5181 Encounter for therapeutic drug level monitoring: Secondary | ICD-10-CM | POA: Diagnosis not present

## 2014-08-09 DIAGNOSIS — I82409 Acute embolism and thrombosis of unspecified deep veins of unspecified lower extremity: Secondary | ICD-10-CM | POA: Diagnosis not present

## 2014-08-09 DIAGNOSIS — I480 Paroxysmal atrial fibrillation: Secondary | ICD-10-CM

## 2014-08-09 LAB — POCT INR: INR: 2.3

## 2014-08-30 ENCOUNTER — Ambulatory Visit (INDEPENDENT_AMBULATORY_CARE_PROVIDER_SITE_OTHER): Payer: Medicare Other | Admitting: *Deleted

## 2014-08-30 DIAGNOSIS — I2699 Other pulmonary embolism without acute cor pulmonale: Secondary | ICD-10-CM

## 2014-08-30 DIAGNOSIS — I82409 Acute embolism and thrombosis of unspecified deep veins of unspecified lower extremity: Secondary | ICD-10-CM | POA: Diagnosis not present

## 2014-08-30 DIAGNOSIS — I480 Paroxysmal atrial fibrillation: Secondary | ICD-10-CM

## 2014-08-30 DIAGNOSIS — I4891 Unspecified atrial fibrillation: Secondary | ICD-10-CM | POA: Diagnosis not present

## 2014-08-30 DIAGNOSIS — Z7901 Long term (current) use of anticoagulants: Secondary | ICD-10-CM | POA: Diagnosis not present

## 2014-08-30 DIAGNOSIS — Z5181 Encounter for therapeutic drug level monitoring: Secondary | ICD-10-CM

## 2014-08-30 LAB — POCT INR: INR: 1.3

## 2014-09-08 ENCOUNTER — Ambulatory Visit (INDEPENDENT_AMBULATORY_CARE_PROVIDER_SITE_OTHER): Payer: Medicare Other | Admitting: *Deleted

## 2014-09-08 DIAGNOSIS — Z5181 Encounter for therapeutic drug level monitoring: Secondary | ICD-10-CM

## 2014-09-08 DIAGNOSIS — I82409 Acute embolism and thrombosis of unspecified deep veins of unspecified lower extremity: Secondary | ICD-10-CM | POA: Diagnosis not present

## 2014-09-08 DIAGNOSIS — I2699 Other pulmonary embolism without acute cor pulmonale: Secondary | ICD-10-CM

## 2014-09-08 DIAGNOSIS — I4891 Unspecified atrial fibrillation: Secondary | ICD-10-CM

## 2014-09-08 DIAGNOSIS — Z7901 Long term (current) use of anticoagulants: Secondary | ICD-10-CM | POA: Diagnosis not present

## 2014-09-08 LAB — POCT INR: INR: 1.7

## 2014-09-29 ENCOUNTER — Ambulatory Visit (INDEPENDENT_AMBULATORY_CARE_PROVIDER_SITE_OTHER): Payer: Medicare Other | Admitting: *Deleted

## 2014-09-29 DIAGNOSIS — I82409 Acute embolism and thrombosis of unspecified deep veins of unspecified lower extremity: Secondary | ICD-10-CM | POA: Diagnosis not present

## 2014-09-29 DIAGNOSIS — I2699 Other pulmonary embolism without acute cor pulmonale: Secondary | ICD-10-CM

## 2014-09-29 DIAGNOSIS — Z5181 Encounter for therapeutic drug level monitoring: Secondary | ICD-10-CM

## 2014-09-29 DIAGNOSIS — Z7901 Long term (current) use of anticoagulants: Secondary | ICD-10-CM

## 2014-09-29 DIAGNOSIS — I4891 Unspecified atrial fibrillation: Secondary | ICD-10-CM | POA: Diagnosis not present

## 2014-09-29 LAB — POCT INR: INR: 1.3

## 2014-10-18 ENCOUNTER — Ambulatory Visit (INDEPENDENT_AMBULATORY_CARE_PROVIDER_SITE_OTHER): Payer: Medicare Other | Admitting: *Deleted

## 2014-10-18 DIAGNOSIS — Z5181 Encounter for therapeutic drug level monitoring: Secondary | ICD-10-CM | POA: Diagnosis not present

## 2014-10-18 DIAGNOSIS — Z7901 Long term (current) use of anticoagulants: Secondary | ICD-10-CM

## 2014-10-18 DIAGNOSIS — I4891 Unspecified atrial fibrillation: Secondary | ICD-10-CM | POA: Diagnosis not present

## 2014-10-18 DIAGNOSIS — I2699 Other pulmonary embolism without acute cor pulmonale: Secondary | ICD-10-CM

## 2014-10-18 DIAGNOSIS — I82409 Acute embolism and thrombosis of unspecified deep veins of unspecified lower extremity: Secondary | ICD-10-CM

## 2014-10-18 LAB — POCT INR: INR: 2.3

## 2014-11-10 ENCOUNTER — Ambulatory Visit (INDEPENDENT_AMBULATORY_CARE_PROVIDER_SITE_OTHER): Payer: Medicare Other | Admitting: *Deleted

## 2014-11-10 DIAGNOSIS — I82409 Acute embolism and thrombosis of unspecified deep veins of unspecified lower extremity: Secondary | ICD-10-CM

## 2014-11-10 DIAGNOSIS — Z5181 Encounter for therapeutic drug level monitoring: Secondary | ICD-10-CM

## 2014-11-10 DIAGNOSIS — Z7901 Long term (current) use of anticoagulants: Secondary | ICD-10-CM | POA: Diagnosis not present

## 2014-11-10 DIAGNOSIS — I2699 Other pulmonary embolism without acute cor pulmonale: Secondary | ICD-10-CM

## 2014-11-10 DIAGNOSIS — I4891 Unspecified atrial fibrillation: Secondary | ICD-10-CM

## 2014-11-10 LAB — POCT INR: INR: 1.4

## 2014-11-10 MED ORDER — WARFARIN SODIUM 5 MG PO TABS: ORAL_TABLET | ORAL | Status: DC

## 2014-11-24 ENCOUNTER — Ambulatory Visit (INDEPENDENT_AMBULATORY_CARE_PROVIDER_SITE_OTHER): Payer: Medicare Other | Admitting: *Deleted

## 2014-11-24 DIAGNOSIS — Z5181 Encounter for therapeutic drug level monitoring: Secondary | ICD-10-CM

## 2014-11-24 DIAGNOSIS — I2699 Other pulmonary embolism without acute cor pulmonale: Secondary | ICD-10-CM | POA: Diagnosis not present

## 2014-11-24 DIAGNOSIS — I4891 Unspecified atrial fibrillation: Secondary | ICD-10-CM | POA: Diagnosis not present

## 2014-11-24 DIAGNOSIS — Z7901 Long term (current) use of anticoagulants: Secondary | ICD-10-CM | POA: Diagnosis not present

## 2014-11-24 DIAGNOSIS — I82409 Acute embolism and thrombosis of unspecified deep veins of unspecified lower extremity: Secondary | ICD-10-CM

## 2014-11-24 LAB — POCT INR: INR: 1.2

## 2014-12-01 ENCOUNTER — Ambulatory Visit (INDEPENDENT_AMBULATORY_CARE_PROVIDER_SITE_OTHER): Payer: Medicare Other | Admitting: *Deleted

## 2014-12-01 DIAGNOSIS — Z7901 Long term (current) use of anticoagulants: Secondary | ICD-10-CM

## 2014-12-01 DIAGNOSIS — I82409 Acute embolism and thrombosis of unspecified deep veins of unspecified lower extremity: Secondary | ICD-10-CM

## 2014-12-01 DIAGNOSIS — I4891 Unspecified atrial fibrillation: Secondary | ICD-10-CM

## 2014-12-01 DIAGNOSIS — I2699 Other pulmonary embolism without acute cor pulmonale: Secondary | ICD-10-CM | POA: Diagnosis not present

## 2014-12-01 DIAGNOSIS — Z5181 Encounter for therapeutic drug level monitoring: Secondary | ICD-10-CM

## 2014-12-01 LAB — POCT INR: INR: 2.3

## 2014-12-15 ENCOUNTER — Ambulatory Visit (INDEPENDENT_AMBULATORY_CARE_PROVIDER_SITE_OTHER): Payer: Medicare Other | Admitting: *Deleted

## 2014-12-15 DIAGNOSIS — Z5181 Encounter for therapeutic drug level monitoring: Secondary | ICD-10-CM

## 2014-12-15 DIAGNOSIS — Z7901 Long term (current) use of anticoagulants: Secondary | ICD-10-CM

## 2014-12-15 DIAGNOSIS — I82409 Acute embolism and thrombosis of unspecified deep veins of unspecified lower extremity: Secondary | ICD-10-CM | POA: Diagnosis not present

## 2014-12-15 DIAGNOSIS — I4891 Unspecified atrial fibrillation: Secondary | ICD-10-CM | POA: Diagnosis not present

## 2014-12-15 DIAGNOSIS — I2699 Other pulmonary embolism without acute cor pulmonale: Secondary | ICD-10-CM | POA: Diagnosis not present

## 2014-12-15 LAB — POCT INR: INR: 3.2

## 2015-01-17 ENCOUNTER — Ambulatory Visit (INDEPENDENT_AMBULATORY_CARE_PROVIDER_SITE_OTHER): Payer: Medicare Other | Admitting: *Deleted

## 2015-01-17 DIAGNOSIS — I82409 Acute embolism and thrombosis of unspecified deep veins of unspecified lower extremity: Secondary | ICD-10-CM

## 2015-01-17 DIAGNOSIS — Z7901 Long term (current) use of anticoagulants: Secondary | ICD-10-CM | POA: Diagnosis not present

## 2015-01-17 DIAGNOSIS — Z5181 Encounter for therapeutic drug level monitoring: Secondary | ICD-10-CM | POA: Diagnosis not present

## 2015-01-17 DIAGNOSIS — I2699 Other pulmonary embolism without acute cor pulmonale: Secondary | ICD-10-CM | POA: Diagnosis not present

## 2015-01-17 DIAGNOSIS — I4891 Unspecified atrial fibrillation: Secondary | ICD-10-CM

## 2015-01-17 LAB — POCT INR: INR: 1.5

## 2015-02-07 ENCOUNTER — Ambulatory Visit (INDEPENDENT_AMBULATORY_CARE_PROVIDER_SITE_OTHER): Payer: Medicare Other | Admitting: *Deleted

## 2015-02-07 DIAGNOSIS — I4891 Unspecified atrial fibrillation: Secondary | ICD-10-CM

## 2015-02-07 DIAGNOSIS — I2699 Other pulmonary embolism without acute cor pulmonale: Secondary | ICD-10-CM

## 2015-02-07 DIAGNOSIS — Z7901 Long term (current) use of anticoagulants: Secondary | ICD-10-CM | POA: Diagnosis not present

## 2015-02-07 DIAGNOSIS — Z5181 Encounter for therapeutic drug level monitoring: Secondary | ICD-10-CM

## 2015-02-07 DIAGNOSIS — I82409 Acute embolism and thrombosis of unspecified deep veins of unspecified lower extremity: Secondary | ICD-10-CM

## 2015-02-07 LAB — POCT INR: INR: 2.7

## 2015-02-22 DIAGNOSIS — T1490 Injury, unspecified: Secondary | ICD-10-CM | POA: Diagnosis not present

## 2015-03-03 ENCOUNTER — Ambulatory Visit (INDEPENDENT_AMBULATORY_CARE_PROVIDER_SITE_OTHER): Payer: Medicare Other | Admitting: *Deleted

## 2015-03-03 DIAGNOSIS — I4891 Unspecified atrial fibrillation: Secondary | ICD-10-CM

## 2015-03-03 DIAGNOSIS — Z5181 Encounter for therapeutic drug level monitoring: Secondary | ICD-10-CM

## 2015-03-03 DIAGNOSIS — I2699 Other pulmonary embolism without acute cor pulmonale: Secondary | ICD-10-CM | POA: Diagnosis not present

## 2015-03-03 DIAGNOSIS — I82409 Acute embolism and thrombosis of unspecified deep veins of unspecified lower extremity: Secondary | ICD-10-CM | POA: Diagnosis not present

## 2015-03-03 DIAGNOSIS — Z7901 Long term (current) use of anticoagulants: Secondary | ICD-10-CM | POA: Diagnosis not present

## 2015-03-03 LAB — POCT INR: INR: 2.7

## 2015-03-23 ENCOUNTER — Ambulatory Visit (INDEPENDENT_AMBULATORY_CARE_PROVIDER_SITE_OTHER): Payer: Medicare Other | Admitting: *Deleted

## 2015-03-23 DIAGNOSIS — Z5181 Encounter for therapeutic drug level monitoring: Secondary | ICD-10-CM | POA: Diagnosis not present

## 2015-03-23 DIAGNOSIS — I2699 Other pulmonary embolism without acute cor pulmonale: Secondary | ICD-10-CM | POA: Diagnosis not present

## 2015-03-23 DIAGNOSIS — I82409 Acute embolism and thrombosis of unspecified deep veins of unspecified lower extremity: Secondary | ICD-10-CM

## 2015-03-23 DIAGNOSIS — I4891 Unspecified atrial fibrillation: Secondary | ICD-10-CM

## 2015-03-23 DIAGNOSIS — Z7901 Long term (current) use of anticoagulants: Secondary | ICD-10-CM | POA: Diagnosis not present

## 2015-03-23 DIAGNOSIS — I480 Paroxysmal atrial fibrillation: Secondary | ICD-10-CM

## 2015-03-23 LAB — POCT INR: INR: 1.4

## 2015-03-25 ENCOUNTER — Emergency Department (HOSPITAL_COMMUNITY)
Admission: EM | Admit: 2015-03-25 | Discharge: 2015-03-25 | Disposition: A | Discharge status: Home / Self Care | Payer: Medicare Other | Attending: Emergency Medicine | Admitting: Emergency Medicine

## 2015-03-25 ENCOUNTER — Emergency Department (HOSPITAL_COMMUNITY): Payer: Medicare Other

## 2015-03-25 ENCOUNTER — Encounter (HOSPITAL_COMMUNITY): Payer: Self-pay | Admitting: Emergency Medicine

## 2015-03-25 DIAGNOSIS — Y9389 Activity, other specified: Secondary | ICD-10-CM | POA: Diagnosis not present

## 2015-03-25 DIAGNOSIS — M1990 Unspecified osteoarthritis, unspecified site: Secondary | ICD-10-CM | POA: Insufficient documentation

## 2015-03-25 DIAGNOSIS — Z9889 Other specified postprocedural states: Secondary | ICD-10-CM | POA: Insufficient documentation

## 2015-03-25 DIAGNOSIS — Z86711 Personal history of pulmonary embolism: Secondary | ICD-10-CM | POA: Insufficient documentation

## 2015-03-25 DIAGNOSIS — S299XXA Unspecified injury of thorax, initial encounter: Secondary | ICD-10-CM | POA: Insufficient documentation

## 2015-03-25 DIAGNOSIS — Z880 Allergy status to penicillin: Secondary | ICD-10-CM | POA: Insufficient documentation

## 2015-03-25 DIAGNOSIS — Y9289 Other specified places as the place of occurrence of the external cause: Secondary | ICD-10-CM | POA: Diagnosis not present

## 2015-03-25 DIAGNOSIS — J449 Chronic obstructive pulmonary disease, unspecified: Secondary | ICD-10-CM | POA: Diagnosis not present

## 2015-03-25 DIAGNOSIS — M109 Gout, unspecified: Secondary | ICD-10-CM | POA: Insufficient documentation

## 2015-03-25 DIAGNOSIS — Z87891 Personal history of nicotine dependence: Secondary | ICD-10-CM | POA: Diagnosis not present

## 2015-03-25 DIAGNOSIS — Z7901 Long term (current) use of anticoagulants: Secondary | ICD-10-CM | POA: Insufficient documentation

## 2015-03-25 DIAGNOSIS — W1839XA Other fall on same level, initial encounter: Secondary | ICD-10-CM | POA: Insufficient documentation

## 2015-03-25 DIAGNOSIS — M160 Bilateral primary osteoarthritis of hip: Secondary | ICD-10-CM | POA: Diagnosis not present

## 2015-03-25 DIAGNOSIS — G8929 Other chronic pain: Secondary | ICD-10-CM | POA: Diagnosis not present

## 2015-03-25 DIAGNOSIS — Z79899 Other long term (current) drug therapy: Secondary | ICD-10-CM | POA: Insufficient documentation

## 2015-03-25 DIAGNOSIS — I482 Chronic atrial fibrillation: Secondary | ICD-10-CM | POA: Diagnosis not present

## 2015-03-25 DIAGNOSIS — R404 Transient alteration of awareness: Secondary | ICD-10-CM | POA: Diagnosis not present

## 2015-03-25 DIAGNOSIS — G629 Polyneuropathy, unspecified: Secondary | ICD-10-CM | POA: Diagnosis not present

## 2015-03-25 DIAGNOSIS — S8992XA Unspecified injury of left lower leg, initial encounter: Secondary | ICD-10-CM | POA: Diagnosis not present

## 2015-03-25 DIAGNOSIS — Y998 Other external cause status: Secondary | ICD-10-CM | POA: Insufficient documentation

## 2015-03-25 DIAGNOSIS — M6281 Muscle weakness (generalized): Secondary | ICD-10-CM | POA: Insufficient documentation

## 2015-03-25 DIAGNOSIS — W19XXXA Unspecified fall, initial encounter: Secondary | ICD-10-CM

## 2015-03-25 DIAGNOSIS — Z86718 Personal history of other venous thrombosis and embolism: Secondary | ICD-10-CM | POA: Diagnosis not present

## 2015-03-25 DIAGNOSIS — M25552 Pain in left hip: Secondary | ICD-10-CM | POA: Diagnosis not present

## 2015-03-25 DIAGNOSIS — R9431 Abnormal electrocardiogram [ECG] [EKG]: Secondary | ICD-10-CM | POA: Diagnosis not present

## 2015-03-25 DIAGNOSIS — R531 Weakness: Secondary | ICD-10-CM | POA: Diagnosis not present

## 2015-03-25 DIAGNOSIS — M25562 Pain in left knee: Secondary | ICD-10-CM | POA: Diagnosis not present

## 2015-03-25 NOTE — ED Notes (Signed)
Patient arrives via EMS from home with fall last night at home around 1700. Patient spent the night on the floor. Patient reports mid/lower back pain that was there prior to fall and states generalized weakness for awhile. Reports pacemaker. CBG 91 with EMS.

## 2015-03-25 NOTE — Discharge Instructions (Signed)
As discussed, your evaluation today has been largely reassuring.  But, it is important that you monitor your condition carefully, and do not hesitate to return to the ED if you develop new, or concerning changes in your condition. ? ?Otherwise, please follow-up with your physician for appropriate ongoing care. ? ?

## 2015-03-25 NOTE — ED Provider Notes (Signed)
CSN: [id number]     Arrival date & time 03/25/15  1001 History  By signing my name below, I, [name], attest that this documentation has been prepared under the direction and in the presence of Tracy Munchobert Hung Rhinesmith, MD.  Electronically Signed: Gwenyth Oberatherine Cross, ED Scribe. 03/25/2015. 10:33 AM. Chief Complaint  Patient presents with  . Fall   The history is provided by the patient. No language interpreter was used.    HPI Comments: [name] is a 81 y.o. female BIBA, accompanied by her family, who presents to the Emergency Department after a fall that occurred last night. Pt states 6/10 middle back pain and left knee pain that radiates distally as associated symptoms. She also notes chronic upper leg weakness that started 2 years ago and is unchanged today. Pt reports that she was unable to stand after she fell last night. She tried crawling and using furniture as support, but was unable to stand because her legs were sore / tired. Pt has had recurrent falls, with increasing frequency, in the last year. She denies LOC. Pt also denies fever, CP, confusion, syncope and vomiting.  Past Medical History  Diagnosis Date  . Permanent atrial fibrillation [facility](HCC)     MDT [id number] pacemaker 03-26-2013 by Dr Ladona Ridgelaylor  . DVT (deep venous thrombosis) (HCC)   . Pulmonary embolism (HCC)   . Brain aneurysm   . Arthritis   . Gout   . Peripheral neuropathy (HCC)   . DJD (degenerative joint disease)   . DDD (degenerative disc disease), lumbar   . COPD (chronic obstructive pulmonary disease) (HCC)   . Bilateral chronic knee pain    Past Surgical History  Procedure Laterality Date  . Pacemaker insertion  09-13-1999; 03-26-2013    PPM gen change by Dr Ladona Ridgelaylor 03-26-2013 - MDT [id number]  . Cardiac catheterization  2001  . Craniotomy clipping  06-2002    unruptured  . Left paraclinoid segment anurysum      [facility]  . Permanent pacemaker generator change N/A 03/26/2013     Procedure: PERMANENT PACEMAKER GENERATOR CHANGE;  Surgeon: Marinus MawGregg W Taylor, MD;  [facility];  Service: Cardiovascular;  Laterality: N/A;   Family History  Problem Relation Age of Onset  . Heart attack Mother   . Heart attack Father    Social History  Substance Use Topics  . Smoking status: Former Smoker -- 0.25 packs/day for 35 years    Types: Cigarettes    Start date: 12/27/1977    Quit date: 05/26/2010  . Smokeless tobacco: Never Used  . Alcohol Use: No   OB History    No data available     Review of Systems  Constitutional: Negative for fever.       Per HPI, otherwise negative  HENT:       Per HPI, otherwise negative  Respiratory:       Per HPI, otherwise negative  Cardiovascular: Negative for chest pain.       Per HPI, otherwise negative  Gastrointestinal: Negative for vomiting.  Endocrine:       Negative aside from HPI  Genitourinary:       Neg aside from HPI   Musculoskeletal: Positive for back pain and arthralgias.       Per HPI, otherwise negative  Skin: Negative.   Neurological: Negative for syncope.  All other systems reviewed and are negative.  Allergies  Bee venom; Codeine; and Penicillins  Home Medications   Prior to Admission  medications   Medication Sig Start Date End Date Taking? Authorizing Provider  allopurinol (ZYLOPRIM) 300 MG tablet Take 1 tablet by mouth Daily. 04/07/11  Yes Historical Provider, MD  digoxin (LANOXIN) 0.125 MG tablet Take 0.0625 mg by mouth daily.   Yes Historical Provider, MD  diltiazem (CARDIZEM CD) 240 MG 24 hr capsule Take 240 mg by mouth daily.     Yes Historical Provider, MD  furosemide (LASIX) 40 MG tablet Take 1 tablet (40 mg total) by mouth daily. 04/08/14  Yes Marinus Maw, MD  gabapentin (NEURONTIN) 300 MG capsule Take 300 mg by mouth at bedtime.     Yes Historical Provider, MD  metoprolol succinate (TOPROL-XL) 100 MG 24 hr tablet Take 1 tablet (100 mg total) by mouth daily. 04/08/14  Yes Marinus Maw, MD   naproxen (NAPROSYN) 500 MG tablet Take 500 mg by mouth 2 (two) times daily with a meal.   Yes Historical Provider, MD  pantoprazole (PROTONIX) 40 MG tablet Take 40 mg by mouth daily.   Yes Historical Provider, MD  warfarin (COUMADIN) 5 MG tablet Take 1 tablet daily except 1 1/2 tablets on Tuesdays and Fridays 11/10/14  Yes Marinus Maw, MD   BP 117/70 mmHg  Pulse 92  Temp(Src) 97.7 F (36.5 C) (Oral)  Resp 19  Ht  (1.727 m)  Wt 190 lb (86.183 kg)  BMI 28.90 kg/m2  SpO2 97% Physical Exam  Constitutional: She is oriented to person, place, and time. She appears well-developed and well-nourished. No distress.  HENT:  Head: Normocephalic and atraumatic.  Eyes: Conjunctivae and EOM are normal.  Cardiovascular: Normal rate, regular rhythm and normal heart sounds.   Pulmonary/Chest: Effort normal and breath sounds normal. No stridor. No respiratory distress. She has no wheezes. She has no rales.  Abdominal: She exhibits no distension.  Musculoskeletal: She exhibits no edema.  Left lateral knee TTP No deformity Quadriceps intact Tenderness in left lateral hip No deformity  Neurological: She is alert and oriented to person, place, and time. No cranial nerve deficit.  Skin: Skin is warm and dry.  Psychiatric: She has a normal mood and affect.  Nursing note and vitals reviewed.   ED Course  Procedures  DIAGNOSTIC STUDIES: Oxygen Saturation is 97% on RA, normal by my interpretation.    COORDINATION OF CARE: 10:32 AM Discussed treatment plan with pt which includes x-rays of her left leg. Pt agreed to plan.  Labs Review Labs Reviewed - No data to display  Imaging Review Dg Knee Complete 4 Views Left  03/25/2015  CLINICAL DATA:  Patient arrives via EMS from home with fall last night at home around 1700. Patient spent the night on the floor. Patient reports mid/lower back pain that was there prior to fall and states generalized weakness for awhile. Also with left knee pain. EXAM:  LEFT KNEE - COMPLETE 4+ VIEW COMPARISON:  None. FINDINGS: No fracture. No dislocation. There is narrowing of the medial and patellofemoral joint space compartments. Minor marginal osteophytes project from all 3 compartments. No bone lesion.  No joint effusion.  Soft tissues are unremarkable. IMPRESSION: No fracture or acute finding.  Mild osteoarthritis. Electronically Signed   By: Amie Portland M.D.   On: 03/25/2015 12:02   Dg Hip Unilat With Pelvis 2-3 Views Left  03/25/2015  CLINICAL DATA:  Larey Seat last night at home by 1700 hours and stent night on the floor, mid and lower back pain including prior to fall, generalized weakness EXAM: DG HIP (  WITH OR WITHOUT PELVIS) 2-3V LEFT COMPARISON:  None FINDINGS: Diffuse osseous demineralization. Advanced osteoarthritic changes of hip joints bilaterally with joint space loss, minimal subchondral sclerosis, bone-on-bone appearance, spur formation, and scattered subchondral cystic changes. SI joints symmetric. Degenerative disc and facet disease changes at visualized lower lumbar spine. No definite acute fracture, dislocation, or bone destruction. IMPRESSION: Advanced osteoarthritic changes of both hip joints. Osseous demineralization with degenerative changes at visualized lower lumbar spine. No definite acute osseous findings. Electronically Signed   By: Ulyses Southward M.D.   On: 03/25/2015 11:57   I have personally reviewed and evaluated these images as part of my medical decision-making.   EKG Interpretation   Date/Time:  Friday March 25 2015 10:08:36 EST Ventricular Rate:  82 PR Interval:    QRS Duration: 138 QT Interval:  421 QTC Calculation: 492 R Axis:   -61 Text Interpretation:  Accelerated junctional rhythm IVCD, consider  atypical RBBB LVH with secondary repolarization abnormality Inferior  infarct, acute (RCA) Anterolateral infarct, old Probable RV involvement,  suggest recording right precordial leads VENTRICULAR PACED RHYTHM Abnormal  ekg  Confirmed by Tracy Munch  MD [phone]) on 03/25/2015 10:15:53 AM      MDM   Final diagnoses:  Fall, initial encounter    I personally performed the services described in this documentation, which was scribed in my presence. The recorded information has been reviewed and is accurate.    Elderly female with frequent falls presents after a fall occurred yesterday. Here, the patient is awake, alert, neurologically intact, hemodynamically stable. No evidence for fracture on multiple x-rays, and no new complaints throughout monitored time here. Patient is on Coumadin, but denies head trauma given the passage of one day since the fall, there is low suspicion for cranial hemorrhage. Patient discharged in stable condition to follow-up with her primary care physician.  Tracy Munch, MD 03/25/15 [phone]

## 2015-04-06 ENCOUNTER — Ambulatory Visit (INDEPENDENT_AMBULATORY_CARE_PROVIDER_SITE_OTHER): Payer: Medicare Other | Admitting: *Deleted

## 2015-04-06 DIAGNOSIS — I2699 Other pulmonary embolism without acute cor pulmonale: Secondary | ICD-10-CM

## 2015-04-06 DIAGNOSIS — I4891 Unspecified atrial fibrillation: Secondary | ICD-10-CM | POA: Diagnosis not present

## 2015-04-06 DIAGNOSIS — I82409 Acute embolism and thrombosis of unspecified deep veins of unspecified lower extremity: Secondary | ICD-10-CM

## 2015-04-06 DIAGNOSIS — Z5181 Encounter for therapeutic drug level monitoring: Secondary | ICD-10-CM | POA: Diagnosis not present

## 2015-04-06 DIAGNOSIS — Z7901 Long term (current) use of anticoagulants: Secondary | ICD-10-CM | POA: Diagnosis not present

## 2015-04-06 LAB — POCT INR: INR: 2.1

## 2015-04-07 DIAGNOSIS — R69 Illness, unspecified: Secondary | ICD-10-CM | POA: Diagnosis not present

## 2015-04-14 DIAGNOSIS — R69 Illness, unspecified: Secondary | ICD-10-CM | POA: Diagnosis not present

## 2015-04-26 ENCOUNTER — Other Ambulatory Visit: Payer: Self-pay | Admitting: Internal Medicine

## 2015-04-27 ENCOUNTER — Telehealth: Payer: Self-pay | Admitting: *Deleted

## 2015-04-27 NOTE — Telephone Encounter (Signed)
[name] canceled her appointment today due to a head cold. Our next available coumad[place] clinic [place] in [place] is January 9th (the [place] so the patient is scheduled to come then.   In the meantime, the patient wanted to confirm that she should continue to take her coumadin on the same schedule until then. I told her a nurse would call her back to confirm those instructions.

## 2015-04-27 NOTE — Telephone Encounter (Signed)
States has head cold and has been taking cough drops and instructed to call [place] office if she sees PCP and is placed on any new medications also wanted to know if she should continue same coumadin dose and pt instructed to continue same dose as was instructed on her visit of 04/06/2015 coumadin 5mg  daily except 7.5mg  on Tuesdays and Fridays and stressed to her need to keep her appt in January and she states understanding. Pt also informs nurse that she has fallen several times since last coumadin appt stressed to her needs to see PCP regarding her falls and pain in shoulders and that she states cannot lift arm up and stressed if she hits her head needs to call MD immediately regarding this and she states understanding

## 2015-05-25 ENCOUNTER — Ambulatory Visit (INDEPENDENT_AMBULATORY_CARE_PROVIDER_SITE_OTHER): Payer: Medicare Other | Admitting: Pharmacist

## 2015-05-25 DIAGNOSIS — I82409 Acute embolism and thrombosis of unspecified deep veins of unspecified lower extremity: Secondary | ICD-10-CM

## 2015-05-25 DIAGNOSIS — I4891 Unspecified atrial fibrillation: Secondary | ICD-10-CM | POA: Diagnosis not present

## 2015-05-25 DIAGNOSIS — I2699 Other pulmonary embolism without acute cor pulmonale: Secondary | ICD-10-CM | POA: Diagnosis not present

## 2015-05-25 DIAGNOSIS — Z5181 Encounter for therapeutic drug level monitoring: Secondary | ICD-10-CM

## 2015-05-25 DIAGNOSIS — Z7901 Long term (current) use of anticoagulants: Secondary | ICD-10-CM

## 2015-05-25 LAB — POCT INR: INR: 1.1

## 2015-06-01 ENCOUNTER — Ambulatory Visit (INDEPENDENT_AMBULATORY_CARE_PROVIDER_SITE_OTHER): Payer: Medicare Other | Admitting: *Deleted

## 2015-06-01 DIAGNOSIS — I82409 Acute embolism and thrombosis of unspecified deep veins of unspecified lower extremity: Secondary | ICD-10-CM | POA: Diagnosis not present

## 2015-06-01 DIAGNOSIS — Z7901 Long term (current) use of anticoagulants: Secondary | ICD-10-CM | POA: Diagnosis not present

## 2015-06-01 DIAGNOSIS — I4891 Unspecified atrial fibrillation: Secondary | ICD-10-CM

## 2015-06-01 DIAGNOSIS — Z5181 Encounter for therapeutic drug level monitoring: Secondary | ICD-10-CM | POA: Diagnosis not present

## 2015-06-01 DIAGNOSIS — I2699 Other pulmonary embolism without acute cor pulmonale: Secondary | ICD-10-CM | POA: Diagnosis not present

## 2015-06-01 LAB — POCT INR: INR: 1.6

## 2015-06-15 ENCOUNTER — Ambulatory Visit (INDEPENDENT_AMBULATORY_CARE_PROVIDER_SITE_OTHER): Payer: Medicare Other | Admitting: *Deleted

## 2015-06-15 ENCOUNTER — Encounter: Payer: Self-pay | Admitting: Physician Assistant

## 2015-06-15 ENCOUNTER — Ambulatory Visit (INDEPENDENT_AMBULATORY_CARE_PROVIDER_SITE_OTHER): Payer: Medicare Other | Admitting: Physician Assistant

## 2015-06-15 VITALS — BP 116/56 | HR 90 | Ht 68.0 in | Wt 178.8 lb

## 2015-06-15 DIAGNOSIS — I2699 Other pulmonary embolism without acute cor pulmonale: Secondary | ICD-10-CM | POA: Diagnosis not present

## 2015-06-15 DIAGNOSIS — I10 Essential (primary) hypertension: Secondary | ICD-10-CM | POA: Diagnosis not present

## 2015-06-15 DIAGNOSIS — I82409 Acute embolism and thrombosis of unspecified deep veins of unspecified lower extremity: Secondary | ICD-10-CM

## 2015-06-15 DIAGNOSIS — R0789 Other chest pain: Secondary | ICD-10-CM

## 2015-06-15 DIAGNOSIS — Z7901 Long term (current) use of anticoagulants: Secondary | ICD-10-CM

## 2015-06-15 DIAGNOSIS — I482 Chronic atrial fibrillation, unspecified: Secondary | ICD-10-CM

## 2015-06-15 DIAGNOSIS — I4891 Unspecified atrial fibrillation: Secondary | ICD-10-CM

## 2015-06-15 DIAGNOSIS — Z950 Presence of cardiac pacemaker: Secondary | ICD-10-CM | POA: Diagnosis not present

## 2015-06-15 DIAGNOSIS — Z5181 Encounter for therapeutic drug level monitoring: Secondary | ICD-10-CM | POA: Diagnosis not present

## 2015-06-15 LAB — POCT INR: INR: 7.3

## 2015-06-15 NOTE — Assessment & Plan Note (Signed)
Blood pressure stable ? ?

## 2015-06-15 NOTE — Assessment & Plan Note (Signed)
Patient hasn't had her pacer checked since 04/2014. To have it checked in 2 weeks.

## 2015-06-15 NOTE — Patient Instructions (Signed)
Your physician recommends that you schedule a follow-up appointment in: with Dr. Ladona Ridgel  Your physician recommends that you continue on your current medications as directed. Please refer to the Current Medication list given to you today.  You have been referred to [facility]  Thank you for choosing [facility]!

## 2015-06-15 NOTE — Assessment & Plan Note (Signed)
Patient has chronic atrial fibrillation and her rate was high when she got here. She is very forgetful when it comes to taking her medication and she continues to live alone. I'm not sure she is taking her medicines on a regular basis. Recommend [facility] consult to assess home situation and help with her medication management. She has also been on Coumadin for many years. She has had multiple falls and does not take this reliably. Her INRs range from 1.0-7. We'll forward this note to Dr. Ladona Ridgel who she has an appointment with on 07/01/15 to see if he wants to start her on Xarelto or just place her on an aspirin.CHADSVASC is at least 6 but she doesn't take her Coumadin regularly.

## 2015-06-15 NOTE — Progress Notes (Signed)
Cardiology Office Note   Date:  06/15/2015   ID:  Tracy, Cross [birth year], MRN [id number]  PCP:  Tracy Maudlin, MD  Cardiologist:  Dr. Ladona Cross  Chief Complaint:    History of Present Illness: [name] is a 82 y.o. female who presents for follow-up. Last seen in 04/2014 by Dr. Ladona Cross.  She has a history of symptomatic bradycardia, chronic atrial fibrillation status post pacemaker insertion secondary to bradycardia. She is on chronic Coumadin and has hypertension. She also has history of pulmonary embolus and DVT. I saw her in 02/2014 at which time she was complaining of chest tightness and shortness of breath with exertion. She had a low risk Lexi scan Myoview at that time with no reversible ischemia or infarction EF 72%.  Discussions have been made in the past about stopping Coumadin because of frequent falls but she has been maintained on it. I spoke with Tracy Derby RN who manages her Coumadin and the patient has had multiple falls in the last 6 months. EMS comes to help get her up but then she refuses to come to the hospital. Her INR is rangong from 1.5 to as high as 7 today. She does not take it regularly or doubles up on her dose.  Patient says sometimes she falls asleep in her recliner during the day and forgets to take all of her medications. She has become quite forgetful and doesn't know what she takes her when. She is very forgetful but continues to live alone. She says has had several falls and 1 fall year ago was pre-severe and she had a large knot on her left hip. Sometimes she gets tightness that eases spontaneously. She is nonspecific as to when this happens. Similar to when I ordered the stress test in 2015.  Past Medical History  Diagnosis Date  . Permanent atrial fibrillation [facility])     MDT [id number] pacemaker 03-26-2013 by Dr [name]  . DVT (deep venous thrombosis) (HCC)   . Pulmonary embolism (HCC)   . Brain aneurysm   . Arthritis   . Gout   .  Peripheral neuropathy (HCC)   . DJD (degenerative joint disease)   . DDD (degenerative disc disease), lumbar   . COPD (chronic obstructive pulmonary disease) (HCC)   . Bilateral chronic knee pain     Past Surgical History  Procedure Laterality Date  . Pacemaker insertion  09-13-1999; 03-26-2013    PPM gen change by Dr [name] 03-26-2013 - MDT [id number]  . Cardiac catheterization  2001  . Craniotomy clipping  06-2002    unruptured  . Left paraclinoid segment anurysum      [facility]  . Permanent pacemaker generator change N/A 03/26/2013    Procedure: PERMANENT PACEMAKER GENERATOR CHANGE;  Surgeon: Tracy Maw, MD;  Location: [facility] CATH LAB;  Service: Cardiovascular;  Laterality: N/A;     Current Outpatient Prescriptions  Medication Sig Dispense Refill  . allopurinol (ZYLOPRIM) 300 MG tablet Take 1 tablet by mouth Daily.    . digoxin (LANOXIN) 0.125 MG tablet Take 0.0625 mg by mouth daily.    Marland Kitchen diltiazem (CARDIZEM CD) 240 MG 24 hr capsule Take 240 mg by mouth daily.      . furosemide (LASIX) 40 MG tablet TAKE 1 TABLET (40 MG TOTAL) BY MOUTH DAILY. 30 tablet 0  . gabapentin (NEURONTIN) 300 MG capsule Take 300 mg by mouth at bedtime.      . metoprolol succinate (TOPROL-XL) 100 MG 24 hr  tablet TAKE 1 TABLET (100 MG TOTAL) BY MOUTH DAILY. 30 tablet 0  . naproxen (NAPROSYN) 500 MG tablet Take 500 mg by mouth 2 (two) times daily with a meal.    . pantoprazole (PROTONIX) 40 MG tablet Take 40 mg by mouth daily.    Marland Kitchen warfarin (COUMADIN) 5 MG tablet Take 1 tablet daily except 1 1/2 tablets on Tuesdays and Fridays 45 tablet 3   No current facility-administered medications for this visit.    Allergies:   Bee venom; Codeine; and Penicillins    Social History:  The patient  reports that she quit smoking about 5 years ago. Her smoking use included Cigarettes. She started smoking about 37 years ago. She has a 8.75 pack-year smoking history. She has never used smokeless tobacco.  She reports that she does not drink alcohol or use illicit drugs.   Family History:  The patient's    family history includes Heart attack in her father and mother.    ROS:  Please see the history of present illness.   Otherwise, review of systems are positive for none.   All other systems are reviewed and negative.    PHYSICAL EXAM: VS:  BP 116/56 mmHg  Pulse 90  Ht  (1.727 m)  Wt 178 lb 12.8 oz (81.103 kg)  BMI 27.19 kg/m2  SpO2 98% , BMI Body mass index is 27.19 kg/(m^2). GEN: Well nourished, well developed, in no acute distress Neck: no JVD, HJR, carotid bruits, or masses Cardiac: Irregular with 2/6 systolic murmur at the left sternal border, no;  rubs, thrill or heave,  Respiratory:  clear to auscultation bilaterally, normal work of breathing GI: soft, nontender, nondistended, + BS MS: no deformity or atrophy Extremities: without cyanosis, clubbing, edema, good distal pulses bilaterally.  Skin: warm and dry, no rash Neuro:  Strength and sensation are intact    EKG:  EKG is ordered today. The ekg ordered today demonstrates atrial fibrillation at 88 bpm although it was faster on exam   Recent Labs: No results found for requested labs within last 365 days.    Lipid Panel No results found for: CHOL, TRIG, HDL, CHOLHDL, VLDL, LDLCALC, LDLDIRECT    Wt Readings from Last 3 Encounters:  06/15/15 178 lb 12.8 oz (81.103 kg)  03/25/15 190 lb (86.183 kg)  04/08/14 198 lb (89.812 kg)      Other studies Reviewed: Additional studies/ records that were reviewed today include and review of the records demonstrates:  Lexi scan Myoview 02/2014 IMPRESSION: 1. No reversible ischemia or infarction.   2. Normal left ventricular wall motion.   3. Left ventricular ejection fraction 72%   4. Low-risk stress test findings*.   *2012 Appropriate Use Criteria for Coronary Revascularization Focused Update: J Am Coll Cardiol.  2012;59(9):857-881. http://content.dementiazones.com.aspx?articleid=[email or web address]     Electronically Signed   By: [name]   On: 02/18/2014 13:17   ASSESSMENT AND PLAN:  ATRIAL FIBRILLATION, CHRONIC Patient has chronic atrial fibrillation and her rate was high when she got here. She is very forgetful when it comes to taking her medication and she continues to live alone. I'm not sure she is taking her medicines on a regular basis. Recommend [facility] consult to assess home situation and help with her medication management. She has also been on Coumadin for many years. She has had multiple falls and does not take this reliably. Her INRs range from 1.0-7. We'll forward this note to Dr. Ladona Cross who she has an appointment with on 07/01/15  to see if he wants to start her on Xarelto or just place her on an aspirin.CHADSVASC is at least 6 but she doesn't take her Coumadin regularly.   CARDIAC PACEMAKER IN SITU Patient hasn't had her pacer checked since 04/2014. To have it checked in 2 weeks.  ESSENTIAL HYPERTENSION, BENIGN Blood pressure stable  Chest pain History of chest pain with low risk Myoview in 02/2014. I suspect she has chest pain when she forgets to take her medications and her atrial fibrillation is going fast. Hopefully [facility]  will make a difference.    Elson Clan, PA-C  06/15/2015 2:24 PM    [facility] [address] [place], [place], [place]  [address] Phone: [phone]; Fax: [phone]

## 2015-06-15 NOTE — Assessment & Plan Note (Signed)
History of chest pain with low risk Myoview in 02/2014. I suspect she has chest pain when she forgets to take her medications and her atrial fibrillation is going fast. Hopefully [facility]  will make a difference.

## 2015-06-18 ENCOUNTER — Other Ambulatory Visit: Payer: Self-pay | Admitting: Internal Medicine

## 2015-06-20 ENCOUNTER — Encounter: Payer: Self-pay | Admitting: *Deleted

## 2015-06-21 ENCOUNTER — Other Ambulatory Visit: Payer: Self-pay | Admitting: *Deleted

## 2015-06-21 NOTE — Patient Outreach (Signed)
[facility]) Care Management  06/21/2015  SEVILLE BRICK 02/13/1926 [id number]   Referral from MD office: assess home, medication management: Telephone call to patient; recording states to enter access code, unable to leave message. Telephone call to alternate number-[phone]; contact listed as brother-in-law. Contact states he will give message to patient to have her return my call.   Plan: will follow up.  Screening appointment placed on call schedule.  Colleen Can, RN BSN CCM Care Management Coordinator [facility] Care Management  [phone]

## 2015-06-22 ENCOUNTER — Other Ambulatory Visit: Payer: Self-pay | Admitting: *Deleted

## 2015-06-22 NOTE — Patient Outreach (Signed)
[facility]) Care Management  06/22/2015  [name] 28-Jul-1925 [id number]   Telephone call attempt x 2 ; unable to leave message.   Plan:  Will follow up.  Colleen Can, RN BSN CCM Care Management Coordinator [facility] Care Management  [phone]

## 2015-06-23 ENCOUNTER — Other Ambulatory Visit: Payer: Self-pay | Admitting: *Deleted

## 2015-06-23 DIAGNOSIS — R296 Repeated falls: Secondary | ICD-10-CM

## 2015-06-23 DIAGNOSIS — I482 Chronic atrial fibrillation, unspecified: Secondary | ICD-10-CM

## 2015-06-24 ENCOUNTER — Other Ambulatory Visit: Payer: Self-pay | Admitting: *Deleted

## 2015-06-24 NOTE — Patient Outreach (Signed)
[facility]) Care Management  06/24/2015  [name] 1926-02-01 [id number]  Referral from MD office-cardiology not sure if patient taking medications on regular basis, INR's range from 1.0-7(patient on coumadin and has had multiple falls per referral).   Telephone call to patient who answered phone. Patient advised of reason for call and of [facility] care management services. HIPPA verification received from patient.  Patient voices that she lives alone and still drives. Voices that she does little cooking and goes to eat at restaurants frequently. Patient was recently at cardiology appointment 02/08 for check up. States she has pacemaker that required checking. Patient admits to falling 2 times in last 6 months and sustained bruising but no broken bones.States she is currently using cane when she walks. States she gets blood work done on regular basis. Voices that she has no trouble driving her self to MD appointments. Has not had recent primary care appointment and could not remember when she had last office visit. Patient voices that she has no trouble getting her prescriptions filled and manages her own medications. Patient states she is on 8 medications but unable to tell why she is taking medications. Patient states she has medic alert and cell phone and knows how to call 911 if needed. States her brother-in-law lives near by and checks on her everyday. States her niece in [place] manages her business affairs.  Patient states major health concern is pain in shoulders and poor circulation. Patient requires frequent redirection during assessment conversation. Recommendations for community case management. Patient agrees to [facility] but concerned that she has lot of boxes and antique furniture that she is trying to get moved to storage.   Plan: Will refer to care management assistant to refer to community. Patient has knowledge deficit regarding her medications. Currently is  falls risk and on chronic coumadin. Has chronic atrial fibrillation, cardiac pacemaker,  benign hypertension. Telephonic signing off.    Colleen Can, RN BSN CCM Care Management Coordinator [facility] Care Management  [phone]

## 2015-06-24 NOTE — Patient Outreach (Signed)
Call to patient in regard to [facility] Community referral.  Spoke with patient and provided overview of [facility] community program. Patient refusing home visit this call.  She states she has a stomach issue this afternoon. Also, reports she has a lot of stuff to go through and has "a mess right now". However, She does agree to let [facility] call her in a couple of weeks.  Uses medbox/weekly med planner, reports she takes 7-8 pills a day. She uses a cane.  She goes out to eat with her friends, still driving.  Plan to call in a couple of weeks to reassess for possible home visit.  Alben Spittle. Albertha Ghee, RN, BSN, CCM  [facility] [employer] [phone]

## 2015-06-27 ENCOUNTER — Ambulatory Visit (INDEPENDENT_AMBULATORY_CARE_PROVIDER_SITE_OTHER): Payer: Medicare Other | Admitting: *Deleted

## 2015-06-27 DIAGNOSIS — I82409 Acute embolism and thrombosis of unspecified deep veins of unspecified lower extremity: Secondary | ICD-10-CM

## 2015-06-27 DIAGNOSIS — Z7901 Long term (current) use of anticoagulants: Secondary | ICD-10-CM

## 2015-06-27 DIAGNOSIS — Z5181 Encounter for therapeutic drug level monitoring: Secondary | ICD-10-CM

## 2015-06-27 DIAGNOSIS — I2699 Other pulmonary embolism without acute cor pulmonale: Secondary | ICD-10-CM

## 2015-06-27 DIAGNOSIS — I4891 Unspecified atrial fibrillation: Secondary | ICD-10-CM

## 2015-06-27 LAB — CBC
HCT: 37.6 % (ref 36.0–46.0)
Hemoglobin: 12.4 g/dL (ref 12.0–15.0)
MCH: 29.5 pg (ref 26.0–34.0)
MCHC: 33 g/dL (ref 30.0–36.0)
MCV: 89.5 fL (ref 78.0–100.0)
MPV: 9.8 fL (ref 8.6–12.4)
Platelets: 235 10*3/uL (ref 150–400)
RBC: 4.2 MIL/uL (ref 3.87–5.11)
RDW: 16.3 % — ABNORMAL HIGH (ref 11.5–15.5)
WBC: 7.7 10*3/uL (ref 4.0–10.5)

## 2015-06-27 LAB — POCT INR: INR: 1.3

## 2015-06-28 LAB — BASIC METABOLIC PANEL
BUN: 7 mg/dL (ref 7–25)
CO2: 32 mmol/L — ABNORMAL HIGH (ref 20–31)
Calcium: 8.8 mg/dL (ref 8.6–10.4)
Chloride: 101 mmol/L (ref 98–110)
Creat: 1.04 mg/dL — ABNORMAL HIGH (ref 0.60–0.88)
Glucose, Bld: 80 mg/dL (ref 65–99)
Potassium: 3.4 mmol/L — ABNORMAL LOW (ref 3.5–5.3)
Sodium: 144 mmol/L (ref 135–146)

## 2015-07-01 ENCOUNTER — Ambulatory Visit (INDEPENDENT_AMBULATORY_CARE_PROVIDER_SITE_OTHER): Payer: Medicare Other | Admitting: Internal Medicine

## 2015-07-01 ENCOUNTER — Encounter: Payer: Self-pay | Admitting: Internal Medicine

## 2015-07-01 VITALS — BP 112/56 | HR 80 | Ht 68.0 in | Wt 179.0 lb

## 2015-07-01 DIAGNOSIS — Z950 Presence of cardiac pacemaker: Secondary | ICD-10-CM | POA: Diagnosis not present

## 2015-07-01 LAB — CUP PACEART INCLINIC DEVICE CHECK
Date Time Interrogation Session: 20170224115055
Implantable Lead Implant Date: 20010509
Implantable Lead Location: 753860
Implantable Lead Model: 5076

## 2015-07-01 NOTE — Patient Instructions (Signed)
Your physician wants you to follow-up in: 1 Year with Dr. Taylor. You will receive a reminder letter in the mail two months in advance. If you don't receive a letter, please call our office to schedule the follow-up appointment.  Your physician recommends that you continue on your current medications as directed. Please refer to the Current Medication list given to you today.  If you need a refill on your cardiac medications before your next appointment, please call your pharmacy.  Thank you for choosing [facility]!    

## 2015-07-02 NOTE — Progress Notes (Signed)
HPI [name] returns today for followup. She is a very pleasant 82 year old woman with a history of symptomatic bradycardia, chronic atrial fibrillation, status post permanent pacemaker insertion secondary to bradycardia. In the interim, she has been stable. She denies chest pain or shortness of breath. No peripheral edema. She is on chronic coumadin but has not fallen since I saw her last.  Allergies  Allergen Reactions  . Bee Venom Swelling  . Codeine     REACTION: GI distress  . Penicillins Other (See Comments)    Reaction unknown     Current Outpatient Prescriptions  Medication Sig Dispense Refill  . allopurinol (ZYLOPRIM) 300 MG tablet Take 1 tablet by mouth Daily.    . digoxin (LANOXIN) 0.125 MG tablet Take 0.0625 mg by mouth daily.    Marland Kitchen diltiazem (CARDIZEM CD) 240 MG 24 hr capsule Take 240 mg by mouth daily.      . furosemide (LASIX) 40 MG tablet TAKE 1 TABLET (40 MG TOTAL) BY MOUTH DAILY. 30 tablet 11  . gabapentin (NEURONTIN) 300 MG capsule Take 300 mg by mouth at bedtime.      . metoprolol succinate (TOPROL-XL) 100 MG 24 hr tablet TAKE 1 TABLET (100 MG TOTAL) BY MOUTH DAILY. 30 tablet 11  . naproxen (NAPROSYN) 500 MG tablet Take 500 mg by mouth 2 (two) times daily with a meal.    . pantoprazole (PROTONIX) 40 MG tablet Take 40 mg by mouth daily.    Marland Kitchen warfarin (COUMADIN) 5 MG tablet Take 1 tablet daily except 1 1/2 tablets on Tuesdays and Fridays 45 tablet 3   No current facility-administered medications for this visit.     Past Medical History  Diagnosis Date  . Permanent atrial fibrillation [facility])     MDT [id number] pacemaker 03-26-2013 by Dr Ladona Ridgel  . DVT (deep venous thrombosis) (HCC)   . Pulmonary embolism (HCC)   . Brain aneurysm   . Arthritis   . Gout   . Peripheral neuropathy (HCC)   . DJD (degenerative joint disease)   . DDD (degenerative disc disease), lumbar   . COPD (chronic obstructive pulmonary disease) (HCC)   . Bilateral chronic knee pain     ROS:   All systems reviewed and negative except as noted in the HPI.   Past Surgical History  Procedure Laterality Date  . Pacemaker insertion  09-13-1999; 03-26-2013    PPM gen change by Dr Ladona Ridgel 03-26-2013 - MDT [id number]  . Cardiac catheterization  2001  . Craniotomy clipping  06-2002    unruptured  . Left paraclinoid segment anurysum      [facility]  . Permanent pacemaker generator change N/A 03/26/2013    Procedure: PERMANENT PACEMAKER GENERATOR CHANGE;  Surgeon: Marinus Maw, MD;  Location: [facility] CATH LAB;  Service: Cardiovascular;  Laterality: N/A;     Family History  Problem Relation Age of Onset  . Heart attack Mother   . Heart attack Father      Social History   Social History  . Marital Status: Widowed    Spouse Name: N/A  . Number of Children: N/A  . Years of Education: N/A   Occupational History  . Not on file.   Social History Main Topics  . Smoking status: Former Smoker -- 0.25 packs/day for 35 years    Types: Cigarettes    Start date: 12/27/1977    Quit date: 05/26/2010  . Smokeless tobacco: Never Used  . Alcohol Use: No  . Drug Use:  No  . Sexual Activity: Not on file   Other Topics Concern  . Not on file   Social History Narrative     BP 112/56 mmHg  Pulse 80  Ht  (1.727 m)  Wt 179 lb (81.194 kg)  BMI 27.22 kg/m2  SpO2 96%  Physical Exam:  Well appearing elderly woman, NAD HEENT: Unremarkable Neck:  7 cm JVD, no thyromegally Lungs:  Clear with no wheezes, rales, or rhonchi. HEART:  Regular rate rhythm, no murmurs, no rubs, no clicks Abd:  soft, positive bowel sounds, no organomegally, no rebound, no guarding Ext:  2 plus pulses, no edema, no cyanosis, no clubbing Skin:  No rashes no nodules Neuro:  CN II through XII intact, motor grossly intact  DEVICE  Normal device function.  See PaceArt for details.   Assess/Plan: 1. Chronic atrial fib - her ventricular rate is well controlled. No change in meds. 2. HTN -  her blood pressure is well controlled. No change in meds. 3. PPM - her Medtronic VVI PM is working normally. Will recheck in several months.  Leonia Reeves.D.

## 2015-07-13 ENCOUNTER — Encounter: Payer: Self-pay | Admitting: *Deleted

## 2015-07-13 ENCOUNTER — Other Ambulatory Visit: Payer: Self-pay | Admitting: *Deleted

## 2015-07-13 NOTE — Patient Outreach (Signed)
Call to patient to attempt to schedule home visit and [facility]eng[facility].  Patient made several excuses when [facility] such as "I have too much to do", "the weather is too cold" and "I have piles of clothes I need to get rid of" RNCM discussed alternative to community visit, the [facility] telephonic program, patient states she is not interested "at this time". Patient does agree to receive information about our program.  Plan to close out referral, mail letter to patient and MD regarding closure.  Tracy SpittleMary E. Albertha GheeNiemczura, RN, BSN, CCM  [facility] [employer] Care Manager [phone]

## 2015-07-28 DIAGNOSIS — T1490 Injury, unspecified: Secondary | ICD-10-CM | POA: Diagnosis not present

## 2015-08-30 ENCOUNTER — Telehealth: Payer: Self-pay | Admitting: Internal Medicine

## 2015-08-30 NOTE — Telephone Encounter (Signed)
Patient is asking if she will be taking Coumadin for "the rest of her life". Please contact patient / tg

## 2015-08-30 NOTE — Telephone Encounter (Signed)
Pt advised that with afib and hx of PE she will remain on some type of anticoagulation chronically,pt verbalized understanding

## 2015-09-21 DIAGNOSIS — I4891 Unspecified atrial fibrillation: Secondary | ICD-10-CM | POA: Diagnosis not present

## 2015-09-21 DIAGNOSIS — F419 Anxiety disorder, unspecified: Secondary | ICD-10-CM | POA: Diagnosis not present

## 2015-09-21 DIAGNOSIS — J449 Chronic obstructive pulmonary disease, unspecified: Secondary | ICD-10-CM | POA: Diagnosis not present

## 2015-09-21 DIAGNOSIS — I10 Essential (primary) hypertension: Secondary | ICD-10-CM | POA: Diagnosis not present

## 2015-09-21 DIAGNOSIS — Z7901 Long term (current) use of anticoagulants: Secondary | ICD-10-CM | POA: Diagnosis not present

## 2015-12-02 DIAGNOSIS — I509 Heart failure, unspecified: Secondary | ICD-10-CM | POA: Diagnosis not present

## 2015-12-02 DIAGNOSIS — I4891 Unspecified atrial fibrillation: Secondary | ICD-10-CM | POA: Diagnosis not present

## 2015-12-02 DIAGNOSIS — J449 Chronic obstructive pulmonary disease, unspecified: Secondary | ICD-10-CM | POA: Diagnosis not present

## 2015-12-02 DIAGNOSIS — I10 Essential (primary) hypertension: Secondary | ICD-10-CM | POA: Diagnosis not present

## 2016-04-18 ENCOUNTER — Inpatient Hospital Stay (HOSPITAL_COMMUNITY)
Admission: EM | Admit: 2016-04-18 | Discharge: 2016-04-20 | DRG: 309 | Disposition: A | Discharge status: Home Health | Payer: Medicare Other | Attending: Pulmonary Disease | Admitting: Pulmonary Disease

## 2016-04-18 ENCOUNTER — Emergency Department (HOSPITAL_COMMUNITY): Payer: Medicare Other

## 2016-04-18 ENCOUNTER — Encounter (HOSPITAL_COMMUNITY): Payer: Self-pay | Admitting: *Deleted

## 2016-04-18 DIAGNOSIS — Z9119 Patient's noncompliance with other medical treatment and regimen: Secondary | ICD-10-CM

## 2016-04-18 DIAGNOSIS — W010XXA Fall on same level from slipping, tripping and stumbling without subsequent striking against object, initial encounter: Secondary | ICD-10-CM | POA: Diagnosis present

## 2016-04-18 DIAGNOSIS — I495 Sick sinus syndrome: Secondary | ICD-10-CM | POA: Diagnosis present

## 2016-04-18 DIAGNOSIS — K219 Gastro-esophageal reflux disease without esophagitis: Secondary | ICD-10-CM | POA: Diagnosis present

## 2016-04-18 DIAGNOSIS — I10 Essential (primary) hypertension: Secondary | ICD-10-CM | POA: Diagnosis present

## 2016-04-18 DIAGNOSIS — Z950 Presence of cardiac pacemaker: Secondary | ICD-10-CM

## 2016-04-18 DIAGNOSIS — G629 Polyneuropathy, unspecified: Secondary | ICD-10-CM | POA: Diagnosis present

## 2016-04-18 DIAGNOSIS — J449 Chronic obstructive pulmonary disease, unspecified: Secondary | ICD-10-CM | POA: Diagnosis present

## 2016-04-18 DIAGNOSIS — Z8249 Family history of ischemic heart disease and other diseases of the circulatory system: Secondary | ICD-10-CM

## 2016-04-18 DIAGNOSIS — Y92009 Unspecified place in unspecified non-institutional (private) residence as the place of occurrence of the external cause: Secondary | ICD-10-CM

## 2016-04-18 DIAGNOSIS — N390 Urinary tract infection, site not specified: Secondary | ICD-10-CM | POA: Diagnosis present

## 2016-04-18 DIAGNOSIS — M545 Low back pain: Secondary | ICD-10-CM | POA: Diagnosis present

## 2016-04-18 DIAGNOSIS — Z7901 Long term (current) use of anticoagulants: Secondary | ICD-10-CM

## 2016-04-18 DIAGNOSIS — R296 Repeated falls: Secondary | ICD-10-CM | POA: Diagnosis present

## 2016-04-18 DIAGNOSIS — R778 Other specified abnormalities of plasma proteins: Secondary | ICD-10-CM | POA: Diagnosis present

## 2016-04-18 DIAGNOSIS — I4891 Unspecified atrial fibrillation: Secondary | ICD-10-CM

## 2016-04-18 DIAGNOSIS — Z87891 Personal history of nicotine dependence: Secondary | ICD-10-CM | POA: Diagnosis not present

## 2016-04-18 DIAGNOSIS — Z23 Encounter for immunization: Secondary | ICD-10-CM | POA: Diagnosis present

## 2016-04-18 DIAGNOSIS — R001 Bradycardia, unspecified: Secondary | ICD-10-CM | POA: Diagnosis present

## 2016-04-18 DIAGNOSIS — Z86711 Personal history of pulmonary embolism: Secondary | ICD-10-CM | POA: Diagnosis not present

## 2016-04-18 DIAGNOSIS — F0390 Unspecified dementia without behavioral disturbance: Secondary | ICD-10-CM | POA: Diagnosis not present

## 2016-04-18 DIAGNOSIS — R2689 Other abnormalities of gait and mobility: Secondary | ICD-10-CM

## 2016-04-18 DIAGNOSIS — M109 Gout, unspecified: Secondary | ICD-10-CM | POA: Diagnosis present

## 2016-04-18 DIAGNOSIS — I482 Chronic atrial fibrillation: Secondary | ICD-10-CM | POA: Diagnosis present

## 2016-04-18 DIAGNOSIS — M5136 Other intervertebral disc degeneration, lumbar region: Secondary | ICD-10-CM | POA: Diagnosis present

## 2016-04-18 DIAGNOSIS — I951 Orthostatic hypotension: Secondary | ICD-10-CM | POA: Diagnosis present

## 2016-04-18 DIAGNOSIS — I4819 Other persistent atrial fibrillation: Secondary | ICD-10-CM

## 2016-04-18 DIAGNOSIS — W19XXXA Unspecified fall, initial encounter: Secondary | ICD-10-CM

## 2016-04-18 LAB — BASIC METABOLIC PANEL
Anion gap: 9 (ref 5–15)
BUN: 13 mg/dL (ref 6–20)
CO2: 28 mmol/L (ref 22–32)
Calcium: 9.2 mg/dL (ref 8.9–10.3)
Chloride: 102 mmol/L (ref 101–111)
Creatinine, Ser: 0.75 mg/dL (ref 0.44–1.00)
GFR calc Af Amer: >60 mL/min (ref >60)
GFR calc non Af Amer: >60 mL/min (ref >60)
Glucose, Bld: 110 mg/dL — ABNORMAL HIGH (ref 65–99)
Potassium: 3.4 mmol/L — ABNORMAL LOW (ref 3.5–5.1)
Sodium: 139 mmol/L (ref 135–145)

## 2016-04-18 LAB — URINALYSIS, ROUTINE W REFLEX MICROSCOPIC
Glucose, UA: NEGATIVE mg/dL
Nitrite: POSITIVE — AB
Protein, ur: 30 mg/dL — AB
Specific Gravity, Urine: 1.025 (ref 1.005–1.030)
pH: 6 (ref 5.0–8.0)

## 2016-04-18 LAB — CBC
HCT: 44 % (ref 36.0–46.0)
Hemoglobin: 14.6 g/dL (ref 12.0–15.0)
MCH: 31.3 pg (ref 26.0–34.0)
MCHC: 33.2 g/dL (ref 30.0–36.0)
MCV: 94.4 fL (ref 78.0–100.0)
Platelets: 156 10*3/uL (ref 150–400)
RBC: 4.66 MIL/uL (ref 3.87–5.11)
RDW: 14.3 % (ref 11.5–15.5)
WBC: 10.9 10*3/uL — ABNORMAL HIGH (ref 4.0–10.5)

## 2016-04-18 LAB — TROPONIN I
Troponin I: 0.04 ng/mL (ref <0.03)
Troponin I: 0.05 ng/mL (ref <0.03)
Troponin I: 0.04 ng/mL (ref <0.03)

## 2016-04-18 LAB — PROTIME-INR
INR: 0.98
Prothrombin Time: 13 seconds (ref 11.4–15.2)

## 2016-04-18 LAB — URINALYSIS, MICROSCOPIC (REFLEX)

## 2016-04-18 LAB — TSH: TSH: 5.611 u[IU]/mL — ABNORMAL HIGH (ref 0.350–4.500)

## 2016-04-18 MED ORDER — METOPROLOL SUCCINATE ER 50 MG PO TB24
100.0000 mg | ORAL_TABLET | Freq: Every day | ORAL | Status: DC
  Administered 2016-04-18 – 2016-04-20 (×3): 100 mg via ORAL
  Filled 2016-04-18 (×6): qty 2

## 2016-04-18 MED ORDER — DEXTROSE 5 % IV SOLN
1.0000 g | Freq: Once | INTRAVENOUS | Status: AC
  Administered 2016-04-18: 1 g via INTRAVENOUS
  Filled 2016-04-18: qty 10

## 2016-04-18 MED ORDER — WARFARIN - PHARMACIST DOSING INPATIENT: Status: DC

## 2016-04-18 MED ORDER — METOPROLOL SUCCINATE ER 100 MG PO TB24: 100.0000 mg | ORAL_TABLET | Freq: Every day | ORAL | Status: DC

## 2016-04-18 MED ORDER — INFLUENZA VAC SPLIT QUAD 0.5 ML IM SUSY
0.5000 mL | PREFILLED_SYRINGE | INTRAMUSCULAR | Status: AC
  Administered 2016-04-19: 0.5 mL via INTRAMUSCULAR
  Filled 2016-04-18: qty 0.5

## 2016-04-18 MED ORDER — PANTOPRAZOLE SODIUM 40 MG PO TBEC
40.0000 mg | DELAYED_RELEASE_TABLET | Freq: Every day | ORAL | Status: DC
  Administered 2016-04-18 – 2016-04-20 (×3): 40 mg via ORAL
  Filled 2016-04-18 (×3): qty 1

## 2016-04-18 MED ORDER — BISACODYL 10 MG RE SUPP: 10.0000 mg | Freq: Every day | RECTAL | Status: DC | PRN

## 2016-04-18 MED ORDER — DIGOXIN 125 MCG PO TABS
0.0625 mg | ORAL_TABLET | Freq: Every day | ORAL | Status: DC
  Administered 2016-04-18 – 2016-04-19 (×2): 0.0625 mg via ORAL
  Filled 2016-04-18: qty 1
  Filled 2016-04-18 (×2): qty 0.5
  Filled 2016-04-18: qty 1
  Filled 2016-04-18: qty 0.5

## 2016-04-18 MED ORDER — PNEUMOCOCCAL VAC POLYVALENT 25 MCG/0.5ML IJ INJ
0.5000 mL | INJECTION | INTRAMUSCULAR | Status: AC
  Administered 2016-04-19: 0.5 mL via INTRAMUSCULAR
  Filled 2016-04-18: qty 0.5

## 2016-04-18 MED ORDER — DILTIAZEM HCL ER COATED BEADS 240 MG PO CP24
240.0000 mg | ORAL_CAPSULE | Freq: Every day | ORAL | Status: DC
  Administered 2016-04-19 – 2016-04-20 (×2): 240 mg via ORAL
  Filled 2016-04-18 (×5): qty 1

## 2016-04-18 MED ORDER — ONDANSETRON HCL 4 MG/2ML IJ SOLN: 4.0000 mg | Freq: Four times a day (QID) | INTRAMUSCULAR | Status: DC | PRN

## 2016-04-18 MED ORDER — DILTIAZEM HCL 100 MG IV SOLR
5.0000 mg/h | Freq: Once | INTRAVENOUS | Status: AC
  Administered 2016-04-18: 5 mg/h via INTRAVENOUS
  Filled 2016-04-18: qty 100

## 2016-04-18 MED ORDER — POTASSIUM CHLORIDE CRYS ER 20 MEQ PO TBCR
40.0000 meq | EXTENDED_RELEASE_TABLET | Freq: Once | ORAL | Status: AC
  Administered 2016-04-18: 40 meq via ORAL
  Filled 2016-04-18: qty 2

## 2016-04-18 MED ORDER — DILTIAZEM HCL 100 MG IV SOLR
5.0000 mg/h | INTRAVENOUS | Status: DC
  Administered 2016-04-18 (×2): 5 mg/h via INTRAVENOUS
  Filled 2016-04-18 (×2): qty 100

## 2016-04-18 MED ORDER — HYDROCODONE-ACETAMINOPHEN 5-325 MG PO TABS
1.0000 | ORAL_TABLET | ORAL | Status: DC | PRN
  Administered 2016-04-18: 2 via ORAL
  Filled 2016-04-18: qty 2

## 2016-04-18 MED ORDER — WARFARIN SODIUM 7.5 MG PO TABS
7.5000 mg | ORAL_TABLET | Freq: Once | ORAL | Status: AC
  Administered 2016-04-18: 7.5 mg via ORAL
  Filled 2016-04-18: qty 1

## 2016-04-18 MED ORDER — GABAPENTIN 300 MG PO CAPS
300.0000 mg | ORAL_CAPSULE | Freq: Every day | ORAL | Status: DC
  Administered 2016-04-18 – 2016-04-19 (×2): 300 mg via ORAL
  Filled 2016-04-18 (×2): qty 1

## 2016-04-18 MED ORDER — ONDANSETRON HCL 4 MG PO TABS: 4.0000 mg | ORAL_TABLET | Freq: Four times a day (QID) | ORAL | Status: DC | PRN

## 2016-04-18 MED ORDER — ASPIRIN 81 MG PO CHEW
81.0000 mg | CHEWABLE_TABLET | Freq: Once | ORAL | Status: AC
  Administered 2016-04-18: 81 mg via ORAL
  Filled 2016-04-18: qty 1

## 2016-04-18 MED ORDER — MAGNESIUM SULFATE 2 GM/50ML IV SOLN
2.0000 g | Freq: Once | INTRAVENOUS | Status: AC
  Administered 2016-04-18: 2 g via INTRAVENOUS
  Filled 2016-04-18: qty 50

## 2016-04-18 MED ORDER — DEXTROSE 5 % IV SOLN
1.0000 g | INTRAVENOUS | Status: DC
  Administered 2016-04-19: 1 g via INTRAVENOUS
  Filled 2016-04-18 (×2): qty 10

## 2016-04-18 NOTE — ED Triage Notes (Signed)
Pt comes in by EMS from home. Pt had a fall last night and was in the floor all night. EMS was called earlier but she refused transport then. Pt then began having chest pain and called EMS back. Pt alert and oriented.

## 2016-04-18 NOTE — H&P (Signed)
TRH H&P   Patient Demographics:    Lexxie Winberg, is a 82 y.o. female  MRN: [id number]   DOB - [birth year]  Admit Date - 04/18/2016  Outpatient Primary MD for the patient is Fredirick Maudlin, MD  Patient coming from: Home  Chief Complaint  Patient presents with  . Chest Pain      HPI:    Bryce Kimble  is a 82 y.o. female, With history of sick sinus syndrome requiring pacemaker placement, A. fib chronic, COPD, PE, gout, GERD who lives at home by herself and uses a cane to ambulate comes to the ER after fall at home which happened yesterday night while she was getting out of the couch. She did not hurt herself, she never passed out, no bowel or bladder incontinence or seizure-like activity, she told her family about it today and was brought to the ER. In the ER workup showed UTI along with A. fib with RVR and I was called to admit. Patient is currently symptom free.    Review of systems:    In addition to the HPI above,   No Fever-chills, No Headache, No changes with Vision or hearing, No problems swallowing food or Liquids, No Chest pain, Cough or Shortness of Breath, No Abdominal pain, No Nausea or Vommitting, Bowel movements are regular, No Blood in stool or Urine, No dysuria, No new skin rashes or bruises, No new joints pains-aches,  No new weakness, tingling, numbness in any extremity, No recent weight gain or loss, No polyuria, polydypsia or polyphagia, No significant Mental Stressors.  A full 10 point Review of Systems was done, except as stated above, all other Review of Systems were negative.   With Past History of the following :    Past Medical History:  Diagnosis Date  .  Arthritis   . Bilateral chronic knee pain   . Brain aneurysm   . COPD (chronic obstructive pulmonary disease) (HCC)   . DDD (degenerative disc disease), lumbar   . DJD (degenerative joint disease)   . DVT (deep venous thrombosis) (HCC)   . Gout   . Peripheral neuropathy (HCC)   . Permanent atrial fibrillation [facility])    MDT [id number] pacemaker 03-26-2013 by Dr Ladona Ridgel  . Pulmonary embolism [facility])       Past Surgical History:  Procedure Laterality Date  .  CARDIAC CATHETERIZATION  2001  . Craniotomy clipping  06-2002   unruptured  . [facility]lin[facility]  . PACEMAKER INSERTION  09-13-1999; 03-26-2013   PPM gen change by Dr Ladona Ridgelaylor 03-26-2013 - MDT [id number]  . PERMANENT PACEMAKER GENERATOR CHANGE N/A 03/26/2013   Procedure: PERMANENT PACEMAKER GENERATOR CHANGE;  Surgeon: Marinus MawGregg W Taylor, MD;  [facility];  Service: Cardiovascular;  Laterality: N/A;      Social History:     Social History  Substance Use Topics  . Smoking status: Former Smoker    Packs/day: 0.25    Years: 35.00    Types: Cigarettes    Start date: 12/27/1977    Quit date: 05/26/2010  . Smokeless tobacco: Never Used  . Alcohol use No         Family History :     Family History  Problem Relation Age of Onset  . Heart attack Mother   . Heart attack Father        Home Medications:   Prior to Admission medications   Medication Sig Start Date End Date Taking? Authorizing Provider  allopurinol (ZYLOPRIM) 300 MG tablet Take 1 tablet by mouth Daily. 04/07/11  Yes Historical Provider, MD  digoxin (LANOXIN) 0.125 MG tablet Take 0.0625 mg by mouth daily.   Yes Historical Provider, MD  diltiazem (CARDIZEM CD) 240 MG 24 hr capsule Take 240 mg by mouth daily.     Yes Historical Provider, MD  furosemide (LASIX) 40 MG tablet TAKE 1 TABLET (40 MG TOTAL) BY MOUTH DAILY. 06/20/15  Yes Dyann KiefMichele M Lenze, PA-C  gabapentin (NEURONTIN) 300 MG capsule Take 300 mg by mouth at  bedtime.     Yes Historical Provider, MD  metoprolol succinate (TOPROL-XL) 100 MG 24 hr tablet TAKE 1 TABLET (100 MG TOTAL) BY MOUTH DAILY. 06/20/15  Yes Dyann KiefMichele M Lenze, PA-C  naproxen (NAPROSYN) 500 MG tablet Take 500 mg by mouth 2 (two) times daily with a meal.   Yes Historical Provider, MD  pantoprazole (PROTONIX) 40 MG tablet Take 40 mg by mouth daily.   Yes Historical Provider, MD  warfarin (COUMADIN) 5 MG tablet Take 1 tablet daily except 1 1/2 tablets on Tuesdays and Fridays 11/10/14  Yes Marinus MawGregg W Taylor, MD     Allergies:     Allergies  Allergen Reactions  . Bee Venom Swelling  . Codeine     REACTION: GI distress     Physical Exam:   Vitals  Blood pressure 122/69, pulse 103, temperature (!) 95.9 F (35.5 C), temperature source Rectal, resp. rate 16, height 5\' 8"  (1.727 m), weight 81.2 kg (179 lb), SpO2 99 %.   1. General pleasant elderly white female lying in bed in NAD,     2. Normal affect and insight, Not Suicidal or Homicidal, Awake Alert, Oriented X 3.  3. No F.N deficits, ALL C.Nerves Intact, Strength 5/5 all 4 extremities, Sensation intact all 4 extremities, Plantars down going.  4. Ears and Eyes appear Normal, Conjunctivae clear, PERRLA. Moist Oral Mucosa.  5. Supple Neck, No JVD, No cervical lymphadenopathy appriciated, No Carotid Bruits.  6. Symmetrical Chest wall movement, Good air movement bilaterally, CTAB.  7. iRRR, No Gallops, Rubs or Murmurs, No Parasternal Heave.  8. Positive Bowel Sounds, Abdomen Soft, No tenderness, No organomegaly appriciated,No rebound -guarding or rigidity.  9.  No Cyanosis, Normal Skin Turgor, No Skin Rash or Bruise.  10. Good muscle tone,  joints appear  normal , no effusions, Normal ROM.  11. No Palpable Lymph Nodes in Neck or Axillae      Data Review:    CBC  Recent Labs Lab 04/18/16 1123  WBC 10.9*  HGB 14.6  HCT 44.0  PLT 156  MCV 94.4  MCH 31.3  MCHC 33.2  RDW 14.3    ------------------------------------------------------------------------------------------------------------------  Chemistries   Recent Labs Lab 04/18/16 1123  NA 139  K 3.4*  CL 102  CO2 28  GLUCOSE 110*  BUN 13  CREATININE 0.75  CALCIUM 9.2   ------------------------------------------------------------------------------------------------------------------ estimated creatinine clearance is 54.3 mL/min (by C-G formula based on SCr of 0.75 mg/dL). ------------------------------------------------------------------------------------------------------------------ No results for input(s): TSH, T4TOTAL, T3FREE, THYROIDAB in the last 72 hours.  Invalid input(s): FREET3  Coagulation profile No results for input(s): INR, PROTIME in the last 168 hours. ------------------------------------------------------------------------------------------------------------------- No results for input(s): DDIMER in the last 72 hours. -------------------------------------------------------------------------------------------------------------------  Cardiac Enzymes  Recent Labs Lab 04/18/16 1123  TROPONINI 0.04*   ------------------------------------------------------------------------------------------------------------------ No results found for: BNP   ---------------------------------------------------------------------------------------------------------------  Urinalysis    Component Value Date/Time   COLORURINE YELLOW 04/18/2016 1225   APPEARANCEUR TURBID (A) 04/18/2016 1225   LABSPEC 1.025 04/18/2016 1225   PHURINE 6.0 04/18/2016 1225   GLUCOSEU NEGATIVE 04/18/2016 1225   HGBUR SMALL (A) 04/18/2016 1225   BILIRUBINUR SMALL (A) 04/18/2016 1225   KETONESUR TRACE (A) 04/18/2016 1225   PROTEINUR 30 (A) 04/18/2016 1225   UROBILINOGEN >8.0 (H) 02/10/2013 1845   NITRITE POSITIVE (A) 04/18/2016 1225   LEUKOCYTESUR MODERATE (A) 04/18/2016 1225     ----------------------------------------------------------------------------------------------------------------   Imaging Results:    Dg Chest 1 View  Result Date: 04/18/2016 CLINICAL DATA:  Larey SeatFell last night, now with chest pain EXAM: CHEST 1 VIEW COMPARISON:  Chest x-ray of 02/10/2013 FINDINGS: No active infiltrate or effusion is seen. Mediastinal and hilar contours are unremarkable. Cardiomegaly is stable with dual lead permanent pacemaker present. The bones are osteopenic and there are considerable degenerative changes within the shoulders. IMPRESSION: 1. Stable cardiomegaly with permanent pacemaker. 2. No pneumonia or effusion. Electronically Signed   By: Dwyane DeePaul  Barry M.D.   On: 04/18/2016 12:24   Dg Lumbar Spine Complete  Result Date: 04/18/2016 CLINICAL DATA:  Larey SeatFell last night, chest pain EXAM: LUMBAR SPINE - COMPLETE 4+ VIEW COMPARISON:  Lumbar spine films of 02/10/2013 FINDINGS: The lumbar vertebrae are normal alignment. There is however diffuse degenerative disc disease throughout the lumbar spine primarily involving L3-4, L4-5, and L5-S1 levels with loss of disc space and sclerosis with spurring. No compression deformity is seen. Moderate abdominal aortic atherosclerotic change is present. The SI joints appear corticated. IMPRESSION: 1. Normal alignment. 2. Diffuse degenerative disc disease particularly at L3-4, L4-5, and L5-S1. 3. Moderate abdominal aortic atherosclerosis. Electronically Signed   By: Dwyane DeePaul  Barry M.D.   On: 04/18/2016 12:22   Ct Head Wo Contrast  Result Date: 04/18/2016 CLINICAL DATA:  Pt had a fall last night and was in the floor all night; pt not sure how she fell or if she had loc; EXAM: CT HEAD WITHOUT CONTRAST TECHNIQUE: Contiguous axial images were obtained from the base of the skull through the vertex without intravenous contrast. COMPARISON:  07/28/2013 FINDINGS: Brain: Diffuse parenchymal atrophy. 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  Patchy areas of hypoattenuation in deep and periventricular white matter bilaterally. Negative for acute intracranial hemorrhage, mass lesion, acute infarction, midline shift, or mass-effect. Acute infarct may be inapparent on noncontrast CT. Ventricles and sulci symmetric. 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 Atherosclerotic and physiologic intracranial calcifications. Skull: Previous left temporal craniotomy. Negative for fracture or other acute lesion. Sinuses/Orbits: Mucoperiosteal thickening in bilateral maxillary sinuses. Partial opacification of bilateral ethmoid air cells. Other: None. IMPRESSION: 1. Negative for bleed or other acute intracranial process. 2. Stable changes of surgical aneurysm clipping and left frontal infarct. Electronically Signed   By: Corlis Leak M.D.   On: 04/18/2016 14:44    My personal review of EKG: Rhythm Afib 138/min  , no Acute ST changes   Assessment & Plan:      1. Chronic A. fib with RVR. [place] vasc 2 score of at least 3. Admit to telemetry, for now Cardizem drip, continue home dose better blocker and Cardizem, once heart rate is below 100 titrate off IV Cardizem. Also continue digoxin. Check TSH. Cardiology has been requested to evaluate as well. Note patient has a pacemaker as well. Continue Coumadin to be monitored by pharmacy.  2. UTI. Placed on Rocephin. Follow cultures.  3. Fall. Likely due to #1 above, PT evaluation, treat A. fib, she was not orthostatic.  4. GERD. On PPI.   DVT Prophylaxis Coumadin - SCDs   AM Labs Ordered, also please review Full Orders  Family Communication: Admission, patients condition and plan of care including tests being ordered have been discussed with the patient and who indicates understanding and agree with the plan and Code Status.  Code Status Full  Likely DC to  Home 1-2 days  Condition GUARDED    Consults called: Cards    Admission status: Inpt    Time spent in minutes :  35   Vilda Zollner K M.D on 04/18/2016 at 3:06 PM  Between 7am to 7pm - Pager - [phone]. After 7pm go to [email or web address] - password [facility]  [facility] - Office  [phone]

## 2016-04-18 NOTE — ED Notes (Signed)
ED Provider at bedside. 

## 2016-04-18 NOTE — ED Provider Notes (Addendum)
[facility]-EMERGENCY DEPT Provider Note   CSN: [id number] Arrival date & time: 04/18/16  1041  By signing my name below, I, Doreatha Martin, attest that this documentation has been prepared under the direction and in the presence of Donnetta Hutching, MD. Electronically Signed: Doreatha Martin, ED Scribe. 04/18/16. 11:22 AM.    History   Chief Complaint Chief Complaint  Patient presents with  . Chest Pain   LEVEL 5 CAVEAT: HPI and ROS limited due to mild dementia   HPI [name] is a 82 y.o. female brought in by ambulance with h/o Afib, pacemaker placement who presents to the Emergency Department complaining of chest tightness and SOB s/p fall that occurred today. Pt does not elaborate on the fall, but also complains of lower back pain that began after the fall. Pt states she was not able to get up on her own after falling. No h/o of similar chest tightness or SOB. She currently lives by herself. She denies diaphoresis, vomiting, leg swelling, abdominal pain, hip pain.   PCP- Fredirick Maudlin, MD   Cardiologist: Dr. Ladona Ridgel   The history is provided by the patient and the EMS personnel. History limited by: mild dementia. No language interpreter was used.    Past Medical History:  Diagnosis Date  . Arthritis   . Bilateral chronic knee pain   . Brain aneurysm   . COPD (chronic obstructive pulmonary disease) (HCC)   . DDD (degenerative disc disease), lumbar   . DJD (degenerative joint disease)   . DVT (deep venous thrombosis) (HCC)   . Gout   . Peripheral neuropathy (HCC)   . Permanent atrial fibrillation [facility])    MDT [id number] pacemaker 03-26-2013 by Dr Ladona Ridgel  . Pulmonary embolism [facility])     Patient Active Problem List   Diagnosis Date Noted  . UTI (urinary tract infection) 04/18/2016  . Chest pain 02/10/2014  . Edema 02/10/2014  . Encounter for therapeutic drug monitoring 08/13/2013  . Long term current use of anticoagulant 07/22/2010  . HYPOTENSION, ORTHOSTATIC 06/15/2010  .  ESSENTIAL HYPERTENSION, BENIGN 04/06/2009  . Cardiac pacemaker in situ 04/06/2009  . PULMONARY EMBOLISM 01/18/2009  . ATRIAL FIBRILLATION, CHRONIC 01/18/2009  . BRADYCARDIA-TACHYCARDIA SYNDROME 01/18/2009  . DVT 01/18/2009    Past Surgical History:  Procedure Laterality Date  . CARDIAC CATHETERIZATION  2001  . Craniotomy clipping  06-2002   unruptured  . Left paraclinoid segment anurysum     [facility]  . PACEMAKER INSERTION  09-13-1999; 03-26-2013   PPM gen change by Dr Ladona Ridgel 03-26-2013 - MDT [id number]  . PERMANENT PACEMAKER GENERATOR CHANGE N/A 03/26/2013   Procedure: PERMANENT PACEMAKER GENERATOR CHANGE;  Surgeon: Marinus Maw, MD;  Location: [facility] CATH LAB;  Service: Cardiovascular;  Laterality: N/A;    OB History    No data available       Home Medications    Prior to Admission medications   Medication Sig Start Date End Date Taking? Authorizing Provider  allopurinol (ZYLOPRIM) 300 MG tablet Take 1 tablet by mouth Daily. 04/07/11  Yes Historical Provider, MD  digoxin (LANOXIN) 0.125 MG tablet Take 0.0625 mg by mouth daily.   Yes Historical Provider, MD  diltiazem (CARDIZEM CD) 240 MG 24 hr capsule Take 240 mg by mouth daily.     Yes Historical Provider, MD  furosemide (LASIX) 40 MG tablet TAKE 1 TABLET (40 MG TOTAL) BY MOUTH DAILY. 06/20/15  Yes Dyann Kief, PA-C  gabapentin (NEURONTIN) 300 MG capsule Take 300 mg  by mouth at bedtime.     Yes Historical Provider, MD  metoprolol succinate (TOPROL-XL) 100 MG 24 hr tablet TAKE 1 TABLET (100 MG TOTAL) BY MOUTH DAILY. 06/20/15  Yes Dyann Kief, PA-C  naproxen (NAPROSYN) 500 MG tablet Take 500 mg by mouth 2 (two) times daily with a meal.   Yes Historical Provider, MD  pantoprazole (PROTONIX) 40 MG tablet Take 40 mg by mouth daily.   Yes Historical Provider, MD  warfarin (COUMADIN) 5 MG tablet Take 1 tablet daily except 1 1/2 tablets on Tuesdays and Fridays 11/10/14  Yes Marinus Maw, MD    Family  History Family History  Problem Relation Age of Onset  . Heart attack Mother   . Heart attack Father     Social History Social History  Substance Use Topics  . Smoking status: Former Smoker    Packs/day: 0.25    Years: 35.00    Types: Cigarettes    Start date: 12/27/1977    Quit date: 05/26/2010  . Smokeless tobacco: Never Used  . Alcohol use No     Allergies   Bee venom and Codeine   Review of Systems Review of Systems  Unable to perform ROS: Dementia     Physical Exam Updated Vital Signs BP 122/69   Pulse 103   Temp (!) 95.9 F (35.5 C) (Rectal)   Resp 16   Ht 5\' 8"  (1.727 m)   Wt 179 lb (81.2 kg)   SpO2 99%   BMI 27.22 kg/m   Physical Exam  Constitutional: She is oriented to person, place, and time. She appears well-developed and well-nourished.  HENT:  Head: Normocephalic and atraumatic.  Eyes: Conjunctivae are normal.  Neck: Neck supple.  Cardiovascular: Normal rate and regular rhythm.   Pulmonary/Chest: Effort normal and breath sounds normal.  Abdominal: Soft. Bowel sounds are normal.  Musculoskeletal: Normal range of motion. She exhibits tenderness.  Lower lumbar tenderness.   Neurological: She is alert and oriented to person, place, and time.  Skin: Skin is warm and dry.  Psychiatric: She has a normal mood and affect. Her behavior is normal.  Nursing note and vitals reviewed.    ED Treatments / Results   DIAGNOSTIC STUDIES: Oxygen Saturation is 95% on RA, adequate by my interpretation.    COORDINATION OF CARE: 11:16 AM Will order lab work, imaging.     Labs (all labs ordered are listed, but only abnormal results are displayed) Labs Reviewed  BASIC METABOLIC PANEL - Abnormal; Notable for the following:       Result Value   Potassium 3.4 (*)    Glucose, Bld 110 (*)    All other components within normal limits  CBC - Abnormal; Notable for the following:    WBC 10.9 (*)    All other components within normal limits  TROPONIN I -  Abnormal; Notable for the following:    Troponin I 0.04 (*)    All other components within normal limits  URINALYSIS, ROUTINE W REFLEX MICROSCOPIC - Abnormal; Notable for the following:    APPearance TURBID (*)    Hgb urine dipstick SMALL (*)    Bilirubin Urine SMALL (*)    Ketones, ur TRACE (*)    Protein, ur 30 (*)    Nitrite POSITIVE (*)    Leukocytes, UA MODERATE (*)    All other components within normal limits  URINALYSIS, MICROSCOPIC (REFLEX) - Abnormal; Notable for the following:    Bacteria, UA MANY (*)  Squamous Epithelial / LPF 6-30 (*)    All other components within normal limits  URINE CULTURE  TROPONIN I  TROPONIN I    EKG  EKG Interpretation  Date/Time:  Wednesday April 18 2016 10:47:51 EST Ventricular Rate:  138 PR Interval:    QRS Duration: 94 QT Interval:  368 QTC Calculation: 558 R Axis:   114 Text Interpretation:  Atrial fibrillation Probable right ventricular hypertrophy Nonspecific T abnormalities, diffuse leads Prolonged QT interval Confirmed by Adriana SimasOOK  MD, Seven Dollens ([id number]) on 04/18/2016 1:38:19 PM       Radiology Dg Chest 1 View  Result Date: 04/18/2016 CLINICAL DATA:  Larey SeatFell last night, now with chest pain EXAM: CHEST 1 VIEW COMPARISON:  Chest x-ray of 02/10/2013 FINDINGS: No active infiltrate or effusion is seen. Mediastinal and hilar contours are unremarkable. Cardiomegaly is stable with dual lead permanent pacemaker present. The bones are osteopenic and there are considerable degenerative changes within the shoulders. IMPRESSION: 1. Stable cardiomegaly with permanent pacemaker. 2. No pneumonia or effusion. Electronically Signed   By: Dwyane DeePaul  Barry M.D.   On: 04/18/2016 12:24   Dg Lumbar Spine Complete  Result Date: 04/18/2016 CLINICAL DATA:  Larey SeatFell last night, chest pain EXAM: LUMBAR SPINE - COMPLETE 4+ VIEW COMPARISON:  Lumbar spine films of 02/10/2013 FINDINGS: The lumbar vertebrae are normal alignment. There is however diffuse degenerative disc  disease throughout the lumbar spine primarily involving L3-4, L4-5, and L5-S1 levels with loss of disc space and sclerosis with spurring. No compression deformity is seen. Moderate abdominal aortic atherosclerotic change is present. The SI joints appear corticated. IMPRESSION: 1. Normal alignment. 2. Diffuse degenerative disc disease particularly at L3-4, L4-5, and L5-S1. 3. Moderate abdominal aortic atherosclerosis. Electronically Signed   By: Dwyane DeePaul  Barry M.D.   On: 04/18/2016 12:22   Ct Head Wo Contrast  Result Date: 04/18/2016 CLINICAL DATA:  Pt had a fall last night and was in the floor all night; pt not sure how she fell or if she had loc; EXAM: CT HEAD WITHOUT CONTRAST TECHNIQUE: Contiguous axial images were obtained from the base of the skull through the vertex without intravenous contrast. COMPARISON:  07/28/2013 FINDINGS: Brain: Diffuse parenchymal atrophy. 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 Patchy areas of hypoattenuation in deep and periventricular white matter bilaterally. Negative for acute intracranial hemorrhage, mass lesion, acute infarction, midline shift, or mass-effect. Acute infarct may be inapparent on noncontrast CT. Ventricles and sulci symmetric. 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 Atherosclerotic and physiologic intracranial calcifications. Skull: Previous left temporal craniotomy. Negative for fracture or other acute lesion. Sinuses/Orbits: Mucoperiosteal thickening in bilateral maxillary sinuses. Partial opacification of bilateral ethmoid air cells. Other: None. IMPRESSION: 1. Negative for bleed or other acute intracranial process. 2. Stable changes of surgical aneurysm clipping and left frontal infarct. Electronically Signed   By: Corlis Leak  Hassell M.D.   On: 04/18/2016 14:44    Procedures Procedures (including critical care time)  Medications Ordered in ED Medications  potassium chloride SA (K-DUR,KLOR-CON) CR tablet 40  mEq (not administered)  magnesium sulfate IVPB 2 g 50 mL (not administered)  cefTRIAXone (ROCEPHIN) 1 g in dextrose 5 % 50 mL IVPB (not administered)  diltiazem (CARDIZEM) 100 mg in dextrose 5 % 100 mL (1 mg/mL) infusion (15 mg/hr Intravenous Rate/Dose Change 04/18/16 1333)  cefTRIAXone (ROCEPHIN) 1 g in dextrose 5 % 50 mL IVPB (1 g Intravenous New Bag/Given 04/18/16 1418)     Initial Impression / Assessment and Plan /  ED Course  I have reviewed the triage vital signs and the nursing notes.  Pertinent labs & imaging results that were available during my care of the patient were reviewed by me and considered in my medical decision making (see chart for details).  Clinical Course     Patient had fall at home and couldn't get up. She is in atrial fibrillation rate 120-130. IV Cardizem started. Additionally, patient has evidence of a urinary tract infection. IV Rocephin, urine culture. Admit to general medicine.  Final Clinical Impressions(s) / ED Diagnoses   Final diagnoses:  Atrial fibrillation, unspecified type (HCC)  Urinary tract infection without hematuria, site unspecified  Fall, initial encounter    New Prescriptions New Prescriptions   No medications on file   I personally performed the services described in this documentation, which was scribed in my presence. The recorded information has been reviewed and is accurate.     Donnetta HutchingBrian Jalisia Puchalski, MD 04/18/16 1403    Donnetta HutchingBrian Ernestina Joe, MD 04/18/16 1457    Donnetta HutchingBrian Rahmir Beever, MD 05/12/16 [phone]

## 2016-04-18 NOTE — Progress Notes (Signed)
ANTICOAGULATION CONSULT NOTE - Initial Consult  Pharmacy Consult for Coumadin (chronic Rx PTA) Indication: atrial fibrillation  Allergies  Allergen Reactions  . Bee Venom Swelling  . Codeine     REACTION: GI distress   Patient Measurements: Height: 5\' 8"  (172.7 cm) Weight: 179 lb (81.2 kg) IBW/kg (Calculated) : 63.9  Vital Signs: Temp: 97.6 F (36.4 C) (12/13 1606) Temp Source: Oral (12/13 1606) BP: 104/67 (12/13 1630) Pulse Rate: 92 (12/13 1630)  Labs:  Recent Labs  04/18/16 1123 04/18/16 1144 04/18/16 1521  HGB 14.6  --   --   HCT 44.0  --   --   PLT 156  --   --   LABPROT  --  13.0  --   INR  --  0.98  --   CREATININE 0.75  --   --   TROPONINI 0.04*  --  0.05*   Estimated Creatinine Clearance: 54.3 mL/min (by C-G formula based on SCr of 0.75 mg/dL).  Medical History: Past Medical History:  Diagnosis Date  . Arthritis   . Bilateral chronic knee pain   . Brain aneurysm   . COPD (chronic obstructive pulmonary disease) (HCC)   . DDD (degenerative disc disease), lumbar   . DJD (degenerative joint disease)   . DVT (deep venous thrombosis) (HCC)   . Gout   . Peripheral neuropathy (HCC)   . Permanent atrial fibrillation [facility](HCC)    MDT [id number] pacemaker 03-26-2013 by Dr Ladona Ridgelaylor  . Pulmonary embolism (HCC)    Medications:   (Not in a hospital admission)  Assessment: 82yo female on chronic Coumadin PTA.  INR at baseline on admission which suggests noncompliance.  Last coumadin dose reportedly last week at unknown day / time.   Goal of Therapy:  INR 2-3 Monitor platelets by anticoagulation protocol: Yes   Plan:  Coumadin 7.5mg  po today x 1 INR daily  Margo AyeHall, Vic Esco A 04/18/2016,4:45 PM

## 2016-04-18 NOTE — ED Notes (Signed)
CRITICAL VALUE ALERT  Critical value received: troponin 0.04  Date of notification:  04/18/16  Time of notification:  1222  Critical value read back:Yes.    Nurse who received alert:  c Cevin Rubinstein rn  Responding MD:  Dr Adriana Simascook  Time MD responded:  [phone]

## 2016-04-18 NOTE — ED Notes (Signed)
Unable to get blood back from IV. 

## 2016-04-18 NOTE — ED Triage Notes (Signed)
Pt denies any chest pain at this time. She states she doesn't know why she is here.   EMS states that patients home is unkempt and they question how she is taking care of herself at home. Family told EMS that pt has become more forgetful in the last 6 months.   Pt is alert and oriented.

## 2016-04-18 NOTE — Final Consult Note (Signed)
Cardiology Consultation   Patient ID: [name]; [id number]; 1925/09/30   Admit date: 04/18/2016 Date of Consult: 04/18/2016  Referring MD:  Dr. Adriana Simasook Cardiologist: Dr. Ladona Ridgelaylor Consulting Cardiologist: Dr. Wyline MoodBranch   Patient Care Team: Kari BaarsEdward Hawkins, MD as PCP - General (Internal Medicine)    Reason for Consultation: Atrial fibrillation with RVR   History of Present Illness: [name] is a 82 y.o. female with a hx of chronic atrial fibrillation on Cardizem CD 240 mg, digoxin 0.125 mg, Toprol 100 mg daily Coumadin, hypertension and permanent pacemaker for bradycardia in 2001 with revision in 2014. Had a normal nuclear stress test in 2015, LVEF 72%. Last saw Dr. Ladona Ridgelaylor 06/2015 and was doing well. Patient also has a history of pulmonary embolus and DVT.  Patient comes to the emergency room today with  rapid atrial fibrillation after a fall. She has had multiple falls in the past and discussion about stopping Coumadin has been made. EMS has to come to her house to help get her up but she usually refuses to come to the hospital. Patient hasn't had her INR checked since February.  Patient actually fell last night and spent the night on the floor and called EMS this morning. She initially refused to come to the hospital. Her brother-in-law called EMS back and told them she was having chest pain so that they bring her to the emergency room. Patient denies having chest pain. Patient says she lost her pills long time ago. She had them in a plastic bag and doesn't know what she did with them. She says she still takes Coumadin but stopped that as well. Her house is in disarray and she trips over things all the time. She still drives and says she is very independent and not willing to go to a nursing facility. She has no family other than a brother-in-law in town and a niece in the [place] area. Has appointment with Dr. Ladona Ridgelaylor in the office tomorrow.  Past Medical History:  Diagnosis  Date  . Arthritis   . Bilateral chronic knee pain   . Brain aneurysm   . COPD (chronic obstructive pulmonary disease) (HCC)   . DDD (degenerative disc disease), lumbar   . DJD (degenerative joint disease)   . DVT (deep venous thrombosis) (HCC)   . Gout   . Peripheral neuropathy (HCC)   . Permanent atrial fibrillation [facility](HCC)    MDT [id number] pacemaker 03-26-2013 by Dr Ladona Ridgelaylor  . Pulmonary embolism [facility](HCC)     Past Surgical History:  Procedure Laterality Date  . CARDIAC CATHETERIZATION  2001  . Craniotomy clipping  06-2002   unruptured  . [facility]aclinoid segment [facility]urysum     [facility]  . PACEMAKER INSERTION  09-13-1999; 03-26-2013   PPM gen change by Dr Ladona Ridgelaylor 03-26-2013 - MDT [id number]  . PERMANENT PACEMAKER GENERATOR CHANGE N/A 03/26/2013   Procedure: PERMANENT PACEMAKER GENERATOR CHANGE;  Surgeon: Marinus MawGregg W Taylor, MD;  Location: [facility] CATH LAB;  Service: Cardiovascular;  Laterality: N/A;      Home Meds: Prior to Admission medications   Medication Sig Start Date End Date Taking? Authorizing Provider  allopurinol (ZYLOPRIM) 300 MG tablet Take 1 tablet by mouth Daily. 04/07/11  Yes Historical Provider, MD  digoxin (LANOXIN) 0.125 MG tablet Take 0.0625 mg by mouth daily.   Yes Historical Provider, MD  diltiazem (CARDIZEM CD) 240 MG 24 hr capsule Take 240 mg by mouth daily.     Yes Historical Provider, MD  furosemide (  LASIX) 40 MG tablet TAKE 1 TABLET (40 MG TOTAL) BY MOUTH DAILY. 06/20/15  Yes Dyann Kief, PA-C  gabapentin (NEURONTIN) 300 MG capsule Take 300 mg by mouth at bedtime.     Yes Historical Provider, MD  metoprolol succinate (TOPROL-XL) 100 MG 24 hr tablet TAKE 1 TABLET (100 MG TOTAL) BY MOUTH DAILY. 06/20/15  Yes Dyann Kief, PA-C  naproxen (NAPROSYN) 500 MG tablet Take 500 mg by mouth 2 (two) times daily with a meal.   Yes Historical Provider, MD  pantoprazole (PROTONIX) 40 MG tablet Take 40 mg by mouth daily.   Yes Historical Provider, MD  warfarin  (COUMADIN) 5 MG tablet Take 1 tablet daily except 1 1/2 tablets on Tuesdays and Fridays 11/10/14  Yes Marinus Maw, MD    Current Medications: . potassium chloride  40 mEq Oral Once     Allergies:    Allergies  Allergen Reactions  . Bee Venom Swelling  . Codeine     REACTION: GI distress    Social History:   The patient  reports that she quit smoking about 5 years ago. Her smoking use included Cigarettes. She started smoking about 38 years ago. She has a 8.75 pack-year smoking history. She has never used smokeless tobacco. She reports that she does not drink alcohol or use drugs.    Family History:   The patient's family history includes Heart attack in her father and mother.   ROS:  Please see the history of present illness.  Review of Systems  Constitution: Negative.  HENT: Negative.   Eyes: Negative.   Cardiovascular: Positive for irregular heartbeat.  Respiratory: Negative.   Hematologic/Lymphatic: Negative.   Musculoskeletal: Negative.  Negative for joint pain.  Gastrointestinal: Negative.   Genitourinary: Negative.   Neurological: Positive for loss of balance.       Frequent falls   All other ROS reviewed and negative.      Vital Signs: Blood pressure 122/69, pulse 103, temperature (!) 95.9 F (35.5 C), temperature source Rectal, resp. rate 16, height 5\' 8"  (1.727 m), weight 179 lb (81.2 kg), SpO2 99 %.   PHYSICAL EXAM: General:  Well nourished, well developed, in no acute distress  HEENT: normal Lymph: no adenopathy Neck: no JVD Endocrine:  No thryomegaly Vascular: No carotid bruits; FA pulses 2+ bilaterally without bruits  Cardiac: Irregular irregular with 2/6 systolic murmur at the left sternal border, no rub, bruit, thrill, or heave Lungs:  clear to auscultation bilaterally, no wheezing, rhonchi or rales  Abd: soft, nontender, no hepatomegaly  Ext: no edema, Good distal pulses bilaterally Musculoskeletal:  No deformities, BUE and BLE strength normal and  equal Skin: warm and dry  Neuro:  CNs 2-12 intact, no focal abnormalities noted Psych:  Normal affect    EKG: Atrial fibrillation at 138 bpm  Telemetry: Atrial fibrillation at 120 bpm  Labs:  Recent Labs  04/18/16 1123  TROPONINI 0.04*   Lab Results  Component Value Date   WBC 10.9 (H) 04/18/2016   HGB 14.6 04/18/2016   HCT 44.0 04/18/2016   MCV 94.4 04/18/2016   PLT 156 04/18/2016    Recent Labs Lab 04/18/16 1123  NA 139  K 3.4*  CL 102  CO2 28  BUN 13  CREATININE 0.75  CALCIUM 9.2  GLUCOSE 110*   No results found for: CHOL, HDL, LDLCALC, TRIG Lab Results  Component Value Date   DDIMER (H) 08/24/2010    1.11  AT THE INHOUSE ESTABLISHED CUTOFF VALUE OF 0.48 ug/mL FEU, THIS ASSAY HAS BEEN DOCUMENTED IN THE LITERATURE TO HAVE A SENSITIVITY AND NEGATIVE PREDICTIVE VALUE OF AT LEAST 98 TO 99%.  THE TEST RESULT SHOULD BE CORRELATED WITH AN ASSESSMENT OF THE CLINICAL PROBABILITY OF DVT / VTE.    Radiology/Studies:  Dg Chest 1 View  Result Date: 04/18/2016 CLINICAL DATA:  Larey SeatFell last night, now with chest pain EXAM: CHEST 1 VIEW COMPARISON:  Chest x-ray of 02/10/2013 FINDINGS: No active infiltrate or effusion is seen. Mediastinal and hilar contours are unremarkable. Cardiomegaly is stable with dual lead permanent pacemaker present. The bones are osteopenic and there are considerable degenerative changes within the shoulders. IMPRESSION: 1. Stable cardiomegaly with permanent pacemaker. 2. No pneumonia or effusion. Electronically Signed   By: Dwyane DeePaul  Barry M.D.   On: 04/18/2016 12:24   Dg Lumbar Spine Complete  Result Date: 04/18/2016 CLINICAL DATA:  Larey SeatFell last night, chest pain EXAM: LUMBAR SPINE - COMPLETE 4+ VIEW COMPARISON:  Lumbar spine films of 02/10/2013 FINDINGS: The lumbar vertebrae are normal alignment. There is however diffuse degenerative disc disease throughout the lumbar spine primarily involving L3-4, L4-5, and L5-S1 levels with loss of disc  space and sclerosis with spurring. No compression deformity is seen. Moderate abdominal aortic atherosclerotic change is present. The SI joints appear corticated. IMPRESSION: 1. Normal alignment. 2. Diffuse degenerative disc disease particularly at L3-4, L4-5, and L5-S1. 3. Moderate abdominal aortic atherosclerosis. Electronically Signed   By: Dwyane DeePaul  Barry M.D.   On: 04/18/2016 12:22   Ct Head Wo Contrast  Result Date: 04/18/2016 CLINICAL DATA:  Pt had a fall last night and was in the floor all night; pt not sure how she fell or if she had loc; EXAM: CT HEAD WITHOUT CONTRAST TECHNIQUE: Contiguous axial images were obtained from the base of the skull through the vertex without intravenous contrast. COMPARISON:  07/28/2013 FINDINGS: Brain: Diffuse parenchymal atrophy. 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 Patchy areas of hypoattenuation in deep and periventricular white matter bilaterally. Negative for acute intracranial hemorrhage, mass lesion, acute infarction, midline shift, or mass-effect. Acute infarct may be inapparent on noncontrast CT. Ventricles and sulci symmetric. 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 Atherosclerotic and physiologic intracranial calcifications. Skull: Previous left temporal craniotomy. Negative for fracture or other acute lesion. Sinuses/Orbits: Mucoperiosteal thickening in bilateral maxillary sinuses. Partial opacification of bilateral ethmoid air cells. Other: None. IMPRESSION: 1. Negative for bleed or other acute intracranial process. 2. Stable changes of surgical aneurysm clipping and left frontal infarct. Electronically Signed   By: Corlis Leak  Hassell M.D.   On: 04/18/2016 14:44  Lexi scan 02/2014 IMPRESSION: 1. No reversible ischemia or infarction.   2. Normal left ventricular wall motion.   3. Left ventricular ejection fraction 72%   4. Low-risk stress test findings*.   *2012 Appropriate Use Criteria for Coronary  Revascularization Focused Update: J Am Coll Cardiol. 2012;59(9):857-881. http://content.dementiazones.comonlinejacc.org/article.aspx?articleid=1201161       PROBLEM LIST:  Principal Problem:   UTI (urinary tract infection) Active Problems:   ATRIAL FIBRILLATION, CHRONIC   BRADYCARDIA-TACHYCARDIA SYNDROME   Cardiac pacemaker in situ   HYPOTENSION, ORTHOSTATIC   Long term current use of anticoagulant     ASSESSMENT AND PLAN:  Atrial fibrillation with RVR secondary to stopping all her medications. On IV diltiazem. Resume Toprol. We'll hold off on digoxin. With her multiple falls and noncompliance I don't think she is a candidate for Coumadin. She hasn't had her  INR checked since February. Discussion about Xarelto has been made in the past.  Fall risk high lives alone, houses in disarray, need social workers to assist with possible placement  Status post pacemaker revision in 2014 for bradycardia  History of pulmonary embolus and DVT had been on long-term Coumadin but noncompliant. Consider Xarelto but doubt compliance with this as well. Add low-dose aspirin for now until decisions made on long-term anticoagulation.  Elevated troponin suspect secondary to rapid atrial fibrillation and demand ischemia. Patient without chest pain or EKG changes. Will follow. Normal Lexi scan in 2015.  UTI  Signed, Jacolyn Reedy, PA-C  04/18/2016 3:03 PM  Attending Note  Patient seen and discussed with PA Geni Bers, I agree with her documentation above. 82 yo female with history of bradycardia with pacemaker followed by Dr Ladona Ridgel, chronic afib. Has been on coumadin but has had issues with medication and coumadin clinic appointment compliance. There has also been concern about anticoagulation and falls at home. Her INR is 0.98 on admit consistent with noncompliance.   K 3.4, Cr 0.75, WBC 10.9, Plt 156, INR 0.98 Trop 0.04,  CT head no acute process EKG afib RVR  Patient presents with afib with RVR, I suspect due to  medication noncompliance as opposed to medication failure. Started on dilt gtt, agree with restarting home regimen and weaning dilt. INR is 0.98, she has not been taking her coumadin at home and has not been making coumadin clinic appts. Frequent falls at home. If she is placed in nursing home at discharge can consider xarelto or eliquis, if discharged home will need to continue to weigh the risks vs benefits given her issues with monitoring compliance and falls. For now hold anticoag. Mild trop suspect related to afib with RVR, follow trend. I would stop digoxin given her age and unpredictable compliance.    Dominga Ferry MD

## 2016-04-19 DIAGNOSIS — F0390 Unspecified dementia without behavioral disturbance: Secondary | ICD-10-CM | POA: Diagnosis not present

## 2016-04-19 DIAGNOSIS — I4891 Unspecified atrial fibrillation: Secondary | ICD-10-CM | POA: Diagnosis not present

## 2016-04-19 DIAGNOSIS — N390 Urinary tract infection, site not specified: Secondary | ICD-10-CM | POA: Diagnosis not present

## 2016-04-19 LAB — CBC
HCT: 38.5 % (ref 36.0–46.0)
Hemoglobin: 12.4 g/dL (ref 12.0–15.0)
MCH: 30.6 pg (ref 26.0–34.0)
MCHC: 32.2 g/dL (ref 30.0–36.0)
MCV: 95.1 fL (ref 78.0–100.0)
Platelets: 165 10*3/uL (ref 150–400)
RBC: 4.05 MIL/uL (ref 3.87–5.11)
RDW: 14.2 % (ref 11.5–15.5)
WBC: 8.4 10*3/uL (ref 4.0–10.5)

## 2016-04-19 LAB — BASIC METABOLIC PANEL
Anion gap: 6 (ref 5–15)
BUN: 18 mg/dL (ref 6–20)
CO2: 28 mmol/L (ref 22–32)
Calcium: 8.8 mg/dL — ABNORMAL LOW (ref 8.9–10.3)
Chloride: 105 mmol/L (ref 101–111)
Creatinine, Ser: 0.87 mg/dL (ref 0.44–1.00)
GFR calc Af Amer: >60 mL/min (ref >60)
GFR calc non Af Amer: 58 mL/min — ABNORMAL LOW (ref >60)
Glucose, Bld: 100 mg/dL — ABNORMAL HIGH (ref 65–99)
Potassium: 4.7 mmol/L (ref 3.5–5.1)
Sodium: 139 mmol/L (ref 135–145)

## 2016-04-19 LAB — MRSA PCR SCREENING: MRSA by PCR: NEGATIVE

## 2016-04-19 LAB — PROTIME-INR
INR: 1.15
Prothrombin Time: 14.8 seconds (ref 11.4–15.2)

## 2016-04-19 LAB — MAGNESIUM: Magnesium: 2.3 mg/dL (ref 1.7–2.4)

## 2016-04-19 NOTE — Progress Notes (Signed)
Subjective: She was admitted yesterday with atrial fibrillation with rapid ventricular response. She had fallen at home. She has fallen multiple occasions. She apparently is not taking any of her medication. Sometime within the last year because she had been having multiple falls I had stopped her Coumadin. She was also not following up and having pro time checked. She has come to my office on multiple occasions and been very confused.  Objective: Vital signs in last 24 hours: Temp:  [95.9 F (35.5 C)-97.6 F (36.4 C)] 97.3 F (36.3 C) (12/14 0400) Pulse Rate:  [92-138] 98 (12/13 1900) Resp:  [14-25] 21 (12/13 1900) BP: (92-143)/(65-88) 121/75 (12/13 1900) SpO2:  [91 %-100 %] 97 % (12/13 1900) Weight:  [76 kg (167 lb 8.8 oz)-81.2 kg (179 lb)] 76 kg (167 lb 8.8 oz) (12/14 0500) Weight change:  Last BM Date: 04/17/16  Intake/Output from previous day: 12/13 0701 - 12/14 0700 In: 100 [IV Piggyback:100] Out: 250 [Urine:250]  PHYSICAL EXAM General appearance: alert, cooperative and Confused Resp: clear to auscultation bilaterally Cardio: Still atrial fib. Occasional paced beat. GI: soft, non-tender; bowel sounds normal; no masses,  no organomegaly Extremities: extremities normal, atraumatic, no cyanosis or edema Mucous membranes are moist. Pupils react. Skin warm and dry  Lab Results:  Results for orders placed or performed during the hospital encounter of 04/18/16 (from the past 48 hour(s))  Basic metabolic panel     Status: Abnormal   Collection Time: 04/18/16 11:23 AM  Result Value Ref Range   Sodium 139 135 - 145 mmol/L   Potassium 3.4 (L) 3.5 - 5.1 mmol/L   Chloride 102 101 - 111 mmol/L   CO2 28 22 - 32 mmol/L   Glucose, Bld 110 (H) 65 - 99 mg/dL   BUN 13 6 - 20 mg/dL   Creatinine, Ser 0.75 0.44 - 1.00 mg/dL   Calcium 9.2 8.9 - 10.3 mg/dL   GFR calc non Af Amer >60 >60 mL/min   GFR calc Af Amer >60 >60 mL/min    Comment: (NOTE) The eGFR has been calculated using the CKD  EPI equation. This calculation has not been validated in all clinical situations. eGFR's persistently <60 mL/min signify possible Chronic Kidney Disease.    Anion gap 9 5 - 15  CBC     Status: Abnormal   Collection Time: 04/18/16 11:23 AM  Result Value Ref Range   WBC 10.9 (H) 4.0 - 10.5 K/uL   RBC 4.66 3.87 - 5.11 MIL/uL   Hemoglobin 14.6 12.0 - 15.0 g/dL   HCT 44.0 36.0 - 46.0 %   MCV 94.4 78.0 - 100.0 fL   MCH 31.3 26.0 - 34.0 pg   MCHC 33.2 30.0 - 36.0 g/dL   RDW 14.3 11.5 - 15.5 %   Platelets 156 150 - 400 K/uL  Troponin I     Status: Abnormal   Collection Time: 04/18/16 11:23 AM  Result Value Ref Range   Troponin I 0.04 (HH) <0.03 ng/mL    Comment: CRITICAL RESULT CALLED TO, READ BACK BY AND VERIFIED WITH: EDWARDS,C AT 12:20PM ON 04/18/16 BY FESTERMAN,C   Protime-INR     Status: None   Collection Time: 04/18/16 11:44 AM  Result Value Ref Range   Prothrombin Time 13.0 11.4 - 15.2 seconds   INR 0.98   Urinalysis, Routine w reflex microscopic     Status: Abnormal   Collection Time: 04/18/16 12:25 PM  Result Value Ref Range   Color, Urine YELLOW YELLOW   APPearance  TURBID (A) CLEAR   Specific Gravity, Urine 1.025 1.005 - 1.030   pH 6.0 5.0 - 8.0   Glucose, UA NEGATIVE NEGATIVE mg/dL   Hgb urine dipstick SMALL (A) NEGATIVE   Bilirubin Urine SMALL (A) NEGATIVE   Ketones, ur TRACE (A) NEGATIVE mg/dL   Protein, ur 30 (A) NEGATIVE mg/dL   Nitrite POSITIVE (A) NEGATIVE   Leukocytes, UA MODERATE (A) NEGATIVE  Urinalysis, Microscopic (reflex)     Status: Abnormal   Collection Time: 04/18/16 12:25 PM  Result Value Ref Range   RBC / HPF 0-5 0 - 5 RBC/hpf   WBC, UA TOO NUMEROUS TO COUNT 0 - 5 WBC/hpf   Bacteria, UA MANY (A) NONE SEEN   Squamous Epithelial / LPF 6-30 (A) NONE SEEN  Troponin I (q 6hr x 3)     Status: Abnormal   Collection Time: 04/18/16  3:21 PM  Result Value Ref Range   Troponin I 0.05 (HH) <0.03 ng/mL    Comment: CRITICAL RESULT CALLED TO, READ BACK BY  AND VERIFIED WITH: ROBINSON,L AT 4:45PM ON 04/18/16 BY FESTERMAN,C   TSH     Status: Abnormal   Collection Time: 04/18/16  4:56 PM  Result Value Ref Range   TSH 5.611 (H) 0.350 - 4.500 uIU/mL    Comment: Performed by a 3rd Generation assay with a functional sensitivity of <=0.01 uIU/mL.  Troponin I (q 6hr x 3)     Status: Abnormal   Collection Time: 04/18/16  8:28 PM  Result Value Ref Range   Troponin I 0.04 (HH) <0.03 ng/mL    Comment: CRITICAL VALUE NOTED.  VALUE IS CONSISTENT WITH PREVIOUSLY REPORTED AND CALLED VALUE.  Magnesium     Status: None   Collection Time: 04/19/16  4:26 AM  Result Value Ref Range   Magnesium 2.3 1.7 - 2.4 mg/dL  Basic metabolic panel     Status: Abnormal   Collection Time: 04/19/16  4:26 AM  Result Value Ref Range   Sodium 139 135 - 145 mmol/L   Potassium 4.7 3.5 - 5.1 mmol/L    Comment: DELTA CHECK NOTED   Chloride 105 101 - 111 mmol/L   CO2 28 22 - 32 mmol/L   Glucose, Bld 100 (H) 65 - 99 mg/dL   BUN 18 6 - 20 mg/dL   Creatinine, Ser 0.87 0.44 - 1.00 mg/dL   Calcium 8.8 (L) 8.9 - 10.3 mg/dL   GFR calc non Af Amer 58 (L) >60 mL/min   GFR calc Af Amer >60 >60 mL/min    Comment: (NOTE) The eGFR has been calculated using the CKD EPI equation. This calculation has not been validated in all clinical situations. eGFR's persistently <60 mL/min signify possible Chronic Kidney Disease.    Anion gap 6 5 - 15  CBC     Status: None   Collection Time: 04/19/16  4:26 AM  Result Value Ref Range   WBC 8.4 4.0 - 10.5 K/uL   RBC 4.05 3.87 - 5.11 MIL/uL   Hemoglobin 12.4 12.0 - 15.0 g/dL   HCT 38.5 36.0 - 46.0 %   MCV 95.1 78.0 - 100.0 fL   MCH 30.6 26.0 - 34.0 pg   MCHC 32.2 30.0 - 36.0 g/dL   RDW 14.2 11.5 - 15.5 %   Platelets 165 150 - 400 K/uL  Protime-INR     Status: None   Collection Time: 04/19/16  4:26 AM  Result Value Ref Range   Prothrombin Time 14.8 11.4 - 15.2 seconds  INR 1.15     ABGS No results for input(s): PHART, PO2ART, TCO2,  HCO3 in the last 72 hours.  Invalid input(s): PCO2 CULTURES No results found for this or any previous visit (from the past 240 hour(s)). Studies/Results: Dg Chest 1 View  Result Date: 04/18/2016 CLINICAL DATA:  [place] last night, now with chest pain EXAM: CHEST 1 VIEW COMPARISON:  Chest x-ray of 02/10/2013 FINDINGS: No active infiltrate or effusion is seen. Mediastinal and hilar contours are unremarkable. Cardiomegaly is stable with dual lead permanent pacemaker present. The bones are osteopenic and there are considerable degenerative changes within the shoulders. IMPRESSION: 1. Stable cardiomegaly with permanent pacemaker. 2. No pneumonia or effusion. Electronically Signed   By: Ivar Drape M.D.   On: 04/18/2016 12:24   Dg Lumbar Spine Complete  Result Date: 04/18/2016 CLINICAL DATA:  [place] last night, chest pain EXAM: LUMBAR SPINE - COMPLETE 4+ VIEW COMPARISON:  Lumbar spine films of 02/10/2013 FINDINGS: The lumbar vertebrae are normal alignment. There is however diffuse degenerative disc disease throughout the lumbar spine primarily involving L3-4, L4-5, and L5-S1 levels with loss of disc space and sclerosis with spurring. No compression deformity is seen. Moderate abdominal aortic atherosclerotic change is present. The SI joints appear corticated. IMPRESSION: 1. Normal alignment. 2. Diffuse degenerative disc disease particularly at L3-4, L4-5, and L5-S1. 3. Moderate abdominal aortic atherosclerosis. Electronically Signed   By: Ivar Drape M.D.   On: 04/18/2016 12:22   Ct Head Wo Contrast  Result Date: 04/18/2016 CLINICAL DATA:  Pt had a fall last night and was in the floor all night; pt not sure how she fell or if she had loc; EXAM: CT HEAD WITHOUT CONTRAST TECHNIQUE: Contiguous axial images were obtained from the base of the skull through the vertex without intravenous contrast. COMPARISON:  07/28/2013 FINDINGS: Brain: Diffuse parenchymal atrophy. 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 Patchy areas of hypoattenuation in deep and periventricular white matter bilaterally. Negative for acute intracranial hemorrhage, mass lesion, acute infarction, midline shift, or mass-effect. Acute infarct may be inapparent on noncontrast CT. Ventricles and sulci symmetric. 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 Atherosclerotic and physiologic intracranial calcifications. Skull: Previous left temporal craniotomy. Negative for fracture or other acute lesion. Sinuses/Orbits: Mucoperiosteal thickening in bilateral maxillary sinuses. Partial opacification of bilateral ethmoid air cells. Other: None. IMPRESSION: 1. Negative for bleed or other acute intracranial process. 2. Stable changes of surgical aneurysm clipping and left frontal infarct. Electronically Signed   By: Lucrezia Europe M.D.   On: 04/18/2016 14:44    Medications:  Prior to Admission:  Prescriptions Prior to Admission  Medication Sig Dispense Refill Last Dose  . allopurinol (ZYLOPRIM) 300 MG tablet Take 300 mg by mouth Daily.    Past Week at Unknown time  . digoxin (LANOXIN) 0.125 MG tablet Take 0.0625 mg by mouth daily.   Past Week at Unknown time  . diltiazem (CARDIZEM CD) 240 MG 24 hr capsule Take 240 mg by mouth daily.     Past Week at Unknown time  . furosemide (LASIX) 40 MG tablet TAKE 1 TABLET (40 MG TOTAL) BY MOUTH DAILY. 30 tablet 11 Past Week at Unknown time  . gabapentin (NEURONTIN) 300 MG capsule Take 300 mg by mouth at bedtime.     Past Week at Unknown time  . metoprolol succinate (TOPROL-XL) 100 MG 24 hr tablet TAKE 1 TABLET (100 MG TOTAL) BY MOUTH DAILY. 30 tablet 11 Past Week at Unknown  time  . naproxen (NAPROSYN) 500 MG tablet Take 500 mg by mouth 2 (two) times daily with a meal.   Past Week at Unknown time  . pantoprazole (PROTONIX) 40 MG tablet Take 40 mg by mouth daily.   Past Week at Unknown time  . warfarin (COUMADIN) 5 MG tablet Take 1 tablet daily except 1 1/2 tablets on  Tuesdays and Fridays 45 tablet 3 Past Week at Unknown time   Scheduled: . cefTRIAXone (ROCEPHIN)  IV  1 g Intravenous Q24H  . digoxin  0.0625 mg Oral Daily  . diltiazem  240 mg Oral Daily  . gabapentin  300 mg Oral QHS  . Influenza vac split quadrivalent PF  0.5 mL Intramuscular Tomorrow-1000  . metoprolol succinate  100 mg Oral Daily  . pantoprazole  40 mg Oral Daily  . pneumococcal 23 valent vaccine  0.5 mL Intramuscular Tomorrow-1000  . Warfarin - Pharmacist Dosing Inpatient   Does not apply Q24H   Continuous: . diltiazem (CARDIZEM) infusion 5 mg/hr (04/18/16 2314)   HKF:[id number], HYDROcodone-acetaminophen, ondansetron **OR** ondansetron (ZOFRAN) IV  Assesment:She was admitted with atrial fibrillation with rapid ventricular response. She has stopped taking all of her medications at home. She has had multiple falls. She had been on Coumadin with no follow-up and was not having INR checked and had multiple falls so I stopped her Coumadin. However she has stopped all of her medications. She thinks she is doing well at home but it sounds like she has a very dangerous situation and is not safe. I will check my office chart but I believe that I have made referral to Adult Protective Services in the past. She doesn't have family in the area. Principal Problem:   UTI (urinary tract infection) Active Problems:   ATRIAL FIBRILLATION, CHRONIC   BRADYCARDIA-TACHYCARDIA SYNDROME   Cardiac pacemaker in situ   HYPOTENSION, ORTHOSTATIC   Long term current use of anticoagulant   A-fib (HCC)    Plan: Continue treatment for cardiac arrhythmias. Request social work consultation.    LOS: 1 day   Arrin Ishler L 04/19/2016, 7:31 AM

## 2016-04-19 NOTE — Care Management Note (Signed)
Case Management Note  Patient Details  Name: [name] MRN: [id number] Date of Birth: 10/21/1925  Subjective/Objective:                  Pt admitted with UTI. Pt is from home, lives alone, has brother and law that assists when needed but no other family. She is ind with ADL's. She uses a cane with ambulation. She has falled multiple times at home. [facility] MD feels pt needs assistance at home, pt not agreeable to ALF placement and 24/7 supervision not possible at DC. RN and SW will also be ordered at DC. Pt is agreeable and has used [facility] in the past and would like to use them again. She understands they have 24 hrs to make first visit. She has PCP and drives herself to appointments. She has asked CM to cancel appointment at with Dr. Ladona Ridgelaylor tomorrow.   Action/Plan: Alroy BailiffLinda Lothian, of [facility], aware of referral and will obtain pt info from chart. [facility] will be notified when pt is DC'd. CM has called cardiology office and they have already DC DC'd the appointment. Pt will need re-schedule after DC.    Expected Discharge Date:     04/21/2016             Expected Discharge Plan:  Home w Home Health Services  In-House Referral:  NA  Discharge planning Services  NA  Post Acute Care Choice:  Home Health Choice offered to:  NA  HH Arranged:  [facility], [facility] Agency[facility]  [facility]  Status of Service:  Completed, signed off  Malcolm MetroChildress, Eesa Justiss Demske, RN 04/19/2016, 1:13 PM

## 2016-04-19 NOTE — Progress Notes (Signed)
Subjective:    No complaints  Objective:   Temp:  [95.9 F (35.5 C)-97.6 F (36.4 C)] 97.4 F (36.3 C) (12/14 0742) Pulse Rate:  [82-138] 82 (12/14 0742) Resp:  [14-25] 15 (12/14 0742) BP: (92-143)/(65-88) 121/75 (12/13 1900) SpO2:  [91 %-100 %] 100 % (12/14 0742) Weight:  [167 lb 8.8 oz (76 kg)-179 lb (81.2 kg)] 167 lb 8.8 oz (76 kg) (12/14 0500) Last BM Date: 04/17/16  Filed Weights   04/18/16 1047 04/19/16 0500  Weight: 179 lb (81.2 kg) 167 lb 8.8 oz (76 kg)    Intake/Output Summary (Last 24 hours) at 04/19/16 0941 Last data filed at 04/18/16 2054  Gross per 24 hour  Intake              100 ml  Output              250 ml  Net             -150 ml    Telemetry: Rate controlled afib  Exam:  General: NAD  HEENT: sclera clear, throat clear  Resp: CTAB  Cardiac: irreg, no mr/g, no jvd  GI: abdomen soft, NT, ND  MSK:no LE edema  Neuro: no focal deficits  Psych: appropriate affect  Lab Results:  Basic Metabolic Panel:  Recent Labs Lab 04/18/16 1123 04/19/16 0426  NA 139 139  K 3.4* 4.7  CL 102 105  CO2 28 28  GLUCOSE 110* 100*  BUN 13 18  CREATININE 0.75 0.87  CALCIUM 9.2 8.8*  MG  --  2.3    Liver Function Tests: No results for input(s): AST, ALT, ALKPHOS, BILITOT, PROT, ALBUMIN in the last 168 hours.  CBC:  Recent Labs Lab 04/18/16 1123 04/19/16 0426  WBC 10.9* 8.4  HGB 14.6 12.4  HCT 44.0 38.5  MCV 94.4 95.1  PLT 156 165    Cardiac Enzymes:  Recent Labs Lab 04/18/16 1123 04/18/16 1521 04/18/16 2028  TROPONINI 0.04* 0.05* 0.04*    BNP: No results for input(s): PROBNP in the last 8760 hours.  Coagulation:  Recent Labs Lab 04/18/16 1144 04/19/16 0426  INR 0.98 1.15    ECG:   Medications:   Scheduled Medications: . cefTRIAXone (ROCEPHIN)  IV  1 g Intravenous Q24H  . digoxin  0.0625 mg Oral Daily  . diltiazem  240 mg Oral Daily  . gabapentin  300 mg Oral QHS  . Influenza vac split quadrivalent PF   0.5 mL Intramuscular Tomorrow-1000  . metoprolol succinate  100 mg Oral Daily  . pantoprazole  40 mg Oral Daily  . pneumococcal 23 valent vaccine  0.5 mL Intramuscular Tomorrow-1000  . Warfarin - Pharmacist Dosing Inpatient   Does not apply Q24H     Infusions: . diltiazem (CARDIZEM) infusion 5 mg/hr (04/18/16 2314)     PRN Medications:  bisacodyl, HYDROcodone-acetaminophen, ondansetron **OR** ondansetron (ZOFRAN) IV     Assessment/Plan    1. Afib with RVR - long history of afib, poor medication compliance with prior admissions with afib with RVR - reports noncompliant with meds at home. Initially controlled overnight with dilt gtt.  - noncompliant with coumadin, INR 0.98. Has not been compilant with coumadin clinic visits.  - from Dr Juanetta GoslingHawkins note today he had stopped her coumadin previuosly due to her poor compliance and falls. At this time she will not be restarted on coumadin, we will d/c pharmacy consult order.  - with her age and compliance issues would not continue digoxin  at this time either.  - she is off dilt drip, rates controlled back on home regimen  2. Bradycardia - has pacemaker, followed in device clinic.    3. Elevated troponin - mild flat troponin elevation in setting of afib with RVR, not consistent with ACS -no ischemic testing planned at this time.   4. Social - lives alone without much support. Notes indicate EMS reports home in very poor order. She has some confusion, especially pertaining to her medication regimen. Would avoid anticoag unless she is ultimately placed in nursing home. With her confusion, lack of support, and falls risk fo complications are quite high.  - agree with SW evaluation   Would monitor rates one more day, likely discharge tomorrow Dina RichJonathan Shota Kohrs, M.D.

## 2016-04-19 NOTE — ACP (Advance Care Planning) (Signed)
Patient shared she may have completed the Advance Directives when she completed her Will. She and her brother stated they would check on this. I shared how they could add this to her medical record when they find it.

## 2016-04-19 NOTE — Evaluation (Signed)
Physical Therapy Evaluation Patient Details Name: [name] MRN: [id number] DOB: [birth year] Today's Date: 04/19/2016   History of Present Illness  82 y.o. female, With history of sick sinus syndrome requiring pacemaker placement, A. fib chronic, COPD, PE, gout, GERD who lives at home by herself and uses a cane to ambulate comes to the ER after fall at home which happened yesterday night while she was getting out of the couch. She did not hurt herself, she never passed out, no bowel or bladder incontinence or seizure-like activity, she told her family about it today and was brought to the ER. In the ER workup showed UTI along with A. fib with RVR.    Clinical Impression  Pt received in bed, and was agreeable to PT evaluation.  Pt states she lives at home alone, and normally ambulates with a cane because she feels like she is going to run people over when she uses a RW.  She states she is independent with dressing, bathing and all household activities.  Her brother in law comes by every day to bring in her mail.  She has had several falls, however she is not able to tell me how many, and becomes tangental when discussing this topic - telling me about falls that happened when she was a teenager.  She is alert and oriented x 4, however she is impulsive, and needs cues not to pull at her IV.  She requires Min A for all functional mobility including ambulation x 93ft with RW.  Distance was limited due to A-fib with frequent PVC's, with HR ranging from 74-103bpm.  Pt would benefit form 24/7 supervision/assistance and HHPT vs ALF, however pt lives alone, and does not seem to be open to the idea of ALF.  She is a high risk for recurrent falls at this time.      Follow Up Recommendations Supervision/Assistance - 24 hour;Home health PT (ALF)    Equipment Recommendations  None recommended by PT    Recommendations for Other Services       Precautions / Restrictions Precautions Precautions:  Fall Precaution Comments: Pt reports multiple falls, not able to tell me how many - pt becomes sided tracked during this - discussing falls that happened several years ago.  Restrictions Weight Bearing Restrictions: No      Mobility  Bed Mobility Overal bed mobility: Needs Assistance Bed Mobility: Supine to Sit     Supine to sit: Supervision     General bed mobility comments: increased time  Transfers Overall transfer level: Needs assistance Equipment used: Rolling walker (2 wheeled) Transfers: Sit to/from Stand Sit to Stand: Min guard         General transfer comment: increased time, and vc's not to pull at lines/leads.   Ambulation/Gait Ambulation/Gait assistance: Min guard Ambulation Distance (Feet): 10 Feet Assistive device: Rolling walker (2 wheeled) Gait Pattern/deviations: Step-through pattern     General Gait Details: Pt requires vc's for which direction to go, and how to maneuver the RW.  Gait distance limited due to continued Afib with frequent PVC's per telemetry monitor in the room.   Stairs            Wheelchair Mobility    Modified Rankin (Stroke Patients Only)       Balance Overall balance assessment: History of Falls;Needs assistance Sitting-balance support: Feet supported;Bilateral upper extremity supported Sitting balance-Leahy Scale: Good     Standing balance support: Bilateral upper extremity supported Standing balance-Leahy Scale: Fair  Pertinent Vitals/Pain Pain Assessment: No/denies pain    Home Living   Living Arrangements: Alone Available Help at Discharge: Available PRN/intermittently (brother in law lives 3-4 miles.  Checks by every day - gets the mail. ) Type of Home: House Home Access: Stairs to enter   [employer] Corporation[employer]trance Stairs-Number of Steps: 1 entry step Home Layout: One level Home Equipment: Cane - single point;Walker - 2 wheels      Prior Function Level of Independence:  Independent with assistive device(s)   Gait / Transfers Assistance Needed: Pt states she has had a fall and therefore, she uses the cane.  She states she was not able to maneuver the RW well, and was afraid she was going to hit someone.    ADL's / Homemaking Assistance Needed: independent with dressing and bathing, driving a car and a truck, and community ambulator to get groceries, eat out, and run errands.         Hand Dominance   Dominant Hand: Right    Extremity/Trunk Assessment   Upper Extremity Assessment Upper Extremity Assessment: Generalized weakness    Lower Extremity Assessment Lower Extremity Assessment: Generalized weakness       Communication   Communication: No difficulties  Cognition Arousal/Alertness: Awake/alert Behavior During Therapy: WFL for tasks assessed/performed Overall Cognitive Status: Within Functional Limits for tasks assessed                 General Comments: However, pt is tangental when answering questions, and has some difficulty following commands as requested.  She requires increased time to perform tasks requested of her.  She also demonstrates impulsivity and poor safety awareness - needed cues not to pull at her IV line    General Comments      Exercises     Assessment/Plan    PT Assessment Patient needs continued PT services  PT Problem List Decreased strength;Decreased knowledge of use of DME;Decreased safety awareness;Decreased knowledge of precautions;Decreased activity tolerance;Decreased balance;Decreased mobility;Decreased coordination;Cardiopulmonary status limiting activity          PT Treatment Interventions DME instruction;Gait training;Functional mobility training;Therapeutic activities;Therapeutic exercise;Balance training;Cognitive remediation;Patient/family education    PT Goals (Current goals can be found in the Care Plan section)  Acute Rehab PT Goals Patient Stated Goal: To go home PT Goal Formulation:  With patient Time For Goal Achievement: 04/26/16 Potential to Achieve Goals: Fair    Frequency Min 3X/week   Barriers to discharge Decreased caregiver support Pt lives alone    Co-evaluation               End of Session Equipment Utilized During Treatment: Gait belt Activity Tolerance: Patient tolerated treatment well;Treatment limited secondary to medical complications (Comment) (limited due to Afib with PVC's. ) Patient left: in chair;with call bell/phone within reach;with family/visitor present Nurse Communication: Mobility status Sheria Lang(Cameron, RN notified of pt's location and mobility status.  Mobility sheet hung up in the room. )    Functional Assessment Tool Used: [employer] AM-PAC "6-clicks"  Functional Limitation: Mobility: Walking and moving around Mobility: Walking and Moving Around Current Status [phone]([id number]): At least 40 percent but less than 60 percent impaired, limited or restricted Mobility: Walking and Moving Around Goal Status [phone]): At least 20 percent but less than 40 percent impaired, limited or restricted    Time: 0849-0909 PT Time Calculation (min) (ACUTE ONLY): 20 min   Charges:   PT Evaluation $PT Eval Moderate Complexity: 1 Procedure     PT G Codes:   PT G-Codes **NOT FOR  INPATIENT CLASS** Functional Assessment Tool Used: [employer] AM-PAC "6-clicks"  Functional Limitation: Mobility: Walking and moving around Mobility: Walking and Moving Around Current Status [phone]([id number]): At least 40 percent but less than 60 percent impaired, limited or restricted Mobility: Walking and Moving Around Goal Status [phone]([id number]): At least 20 percent but less than 40 percent impaired, limited or restricted    Beth Dayquan Buys, PT, DPT X: [phone]

## 2016-04-20 DIAGNOSIS — I4891 Unspecified atrial fibrillation: Secondary | ICD-10-CM | POA: Diagnosis not present

## 2016-04-20 DIAGNOSIS — F0390 Unspecified dementia without behavioral disturbance: Secondary | ICD-10-CM | POA: Diagnosis not present

## 2016-04-20 DIAGNOSIS — N390 Urinary tract infection, site not specified: Secondary | ICD-10-CM | POA: Diagnosis not present

## 2016-04-20 LAB — PROTIME-INR
INR: 1.11
Prothrombin Time: 14.3 seconds (ref 11.4–15.2)

## 2016-04-20 MED ORDER — CEFUROXIME AXETIL 250 MG PO TABS: 250.0000 mg | ORAL_TABLET | Freq: Two times a day (BID) | ORAL | 0 refills | Status: DC

## 2016-04-20 NOTE — Discharge Summary (Signed)
Physician Discharge Summary  Patient ID: [name] MRN: [id number] DOB/AGE: 1925/10/15 82 y.o. Primary Care Physician:Chief Walkup L, MD Admit date: 04/18/2016 Discharge date: 04/20/2016    Discharge Diagnoses:   Principal Problem:   UTI (urinary tract infection) Active Problems:   Essential hypertension, benign   ATRIAL FIBRILLATION, CHRONIC   BRADYCARDIA-TACHYCARDIA SYNDROME   Cardiac pacemaker in situ   HYPOTENSION, ORTHOSTATIC   Long term current use of anticoagulant   A-fib (HCC) Multiple falls Balance disorder  Allergies as of 04/20/2016      Reactions   Bee Venom Swelling   Codeine    REACTION: GI distress      Medication List    STOP taking these medications   digoxin 0.125 MG tablet Commonly known as:  LANOXIN   warfarin 5 MG tablet Commonly known as:  COUMADIN     TAKE these medications   allopurinol 300 MG tablet Commonly known as:  ZYLOPRIM Take 300 mg by mouth Daily.   cefUROXime 250 MG tablet Commonly known as:  CEFTIN Take 1 tablet (250 mg total) by mouth 2 (two) times daily with a meal.   diltiazem 240 MG 24 hr capsule Commonly known as:  CARDIZEM CD Take 240 mg by mouth daily.   furosemide 40 MG tablet Commonly known as:  LASIX TAKE 1 TABLET (40 MG TOTAL) BY MOUTH DAILY.   gabapentin 300 MG capsule Commonly known as:  NEURONTIN Take 300 mg by mouth at bedtime.   metoprolol succinate 100 MG 24 hr tablet Commonly known as:  TOPROL-XL TAKE 1 TABLET (100 MG TOTAL) BY MOUTH DAILY.   naproxen 500 MG tablet Commonly known as:  NAPROSYN Take 500 mg by mouth 2 (two) times daily with a meal.   pantoprazole 40 MG tablet Commonly known as:  PROTONIX Take 40 mg by mouth daily.       Discharged Condition:Improved    Consults: Cardiology physical therapy  Significant Diagnostic Studies: Dg Chest 1 View  Result Date: 04/18/2016 CLINICAL DATA:  Larey SeatFell last night, now with chest pain EXAM: CHEST 1 VIEW COMPARISON:  Chest  x-ray of 02/10/2013 FINDINGS: No active infiltrate or effusion is seen. Mediastinal and hilar contours are unremarkable. Cardiomegaly is stable with dual lead permanent pacemaker present. The bones are osteopenic and there are considerable degenerative changes within the shoulders. IMPRESSION: 1. Stable cardiomegaly with permanent pacemaker. 2. No pneumonia or effusion. Electronically Signed   By: Dwyane DeePaul  Barry M.D.   On: 04/18/2016 12:24   Dg Lumbar Spine Complete  Result Date: 04/18/2016 CLINICAL DATA:  Larey SeatFell last night, chest pain EXAM: LUMBAR SPINE - COMPLETE 4+ VIEW COMPARISON:  Lumbar spine films of 02/10/2013 FINDINGS: The lumbar vertebrae are normal alignment. There is however diffuse degenerative disc disease throughout the lumbar spine primarily involving L3-4, L4-5, and L5-S1 levels with loss of disc space and sclerosis with spurring. No compression deformity is seen. Moderate abdominal aortic atherosclerotic change is present. The SI joints appear corticated. IMPRESSION: 1. Normal alignment. 2. Diffuse degenerative disc disease particularly at L3-4, L4-5, and L5-S1. 3. Moderate abdominal aortic atherosclerosis. Electronically Signed   By: Dwyane DeePaul  Barry M.D.   On: 04/18/2016 12:22   Ct Head Wo Contrast  Result Date: 04/18/2016 CLINICAL DATA:  Pt had a fall last night and was in the floor all night; pt not sure how she fell or if she had loc; EXAM: CT HEAD WITHOUT CONTRAST TECHNIQUE: Contiguous axial images were obtained from the base of the skull through the vertex without  intravenous contrast. COMPARISON:  07/28/2013 FINDINGS: Brain: Diffuse parenchymal atrophy. 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 Patchy areas of hypoattenuation in deep and periventricular white matter bilaterally. Negative for acute intracranial hemorrhage, mass lesion, acute infarction, midline shift, or mass-effect. Acute infarct may be inapparent on noncontrast CT. Ventricles and sulci symmetric. 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 Atherosclerotic and physiologic intracranial calcifications. Skull: Previous left temporal craniotomy. Negative for fracture or other acute lesion. Sinuses/Orbits: Mucoperiosteal thickening in bilateral maxillary sinuses. Partial opacification of bilateral ethmoid air cells. Other: None. IMPRESSION: 1. Negative for bleed or other acute intracranial process. 2. Stable changes of surgical aneurysm clipping and left frontal infarct. Electronically Signed   By: Corlis Leak M.D.   On: 04/18/2016 14:44    Lab Results: Basic Metabolic Panel:  Recent Labs  24/40/10 1123 04/19/16 0426  NA 139 139  K 3.4* 4.7  CL 102 105  CO2 28 28  GLUCOSE 110* 100*  BUN 13 18  CREATININE 0.75 0.87  CALCIUM 9.2 8.8*  MG  --  2.3   Liver Function Tests: No results for input(s): AST, ALT, ALKPHOS, BILITOT, PROT, ALBUMIN in the last 72 hours.   CBC:  Recent Labs  04/18/16 1123 04/19/16 0426  WBC 10.9* 8.4  HGB 14.6 12.4  HCT 44.0 38.5  MCV 94.4 95.1  PLT 156 165    Recent Results (from the past 240 hour(s))  MRSA PCR Screening     Status: None   Collection Time: 04/19/16  4:09 AM  Result Value Ref Range Status   MRSA by PCR NEGATIVE NEGATIVE Final    Comment:        The GeneXpert MRSA Assay (FDA approved for NASAL specimens only), is one component of a comprehensive MRSA colonization surveillance program. It is not intended to diagnose MRSA infection nor to guide or monitor treatment for MRSA infections.      Hospital Course: This is an 82 year old who came to the emergency department after a fall at home. She was found to have a urinary tract infection and she had atrial fib with rapid ventricular response. It turned out she has stopped all of her medications at home. She was restarted on diltiazem initially intravenously started on Rocephin for urinary tract infection and improved. Urine culture is still pending but she has  done much better on Rocephin so I will send her home on a similar cephalosporin. Her heart rate is better controlled. She is no longer a candidate for any anticoagulation because of multiple falls and medical noncompliance. She is no longer a candidate for digoxin for the medical noncompliance. We discussed placement but she refuses.  Discharge Exam: Blood pressure (!) 106/56, pulse 66, temperature 97.8 F (36.6 C), temperature source Oral, resp. rate 20, height 5\' 8"  (1.727 m), weight 78.7 kg (173 lb 8 oz), SpO2 97 %. She is awake and alert. Chest is clear. Her heart is regular. Abdomen is soft with no masses.  Disposition: Home with home health services  Discharge Instructions    Discharge patient    Complete by:  As directed    Face-to-face encounter (required for Medicare/Medicaid patients)    Complete by:  As directed    I Rector Devonshire L certify that this patient is under my care and that I, or a nurse practitioner or physician's assistant working with me, had a face-to-face encounter that meets the physician face-to-face encounter requirements with this patient on 04/20/2016. The encounter  with the patient was in whole, or in part for the following medical condition(s) which is the primary reason for home health care (List medical condition): UTI/atrial fib with rapid ventricular response   The encounter with the patient was in whole, or in part, for the following medical condition, which is the primary reason for home health care:  UTI/atrial fib with rapid ventricular response   I certify that, based on my findings, the following services are medically necessary home health services:   Nursing Physical therapy     Reason for Medically Necessary Home Health Services:  Skilled Nursing- Change/Decline in Patient Status   My clinical findings support the need for the above services:   Unable to leave home safely without assistance and/or assistive device Unsafe ambulation due to balance  issues     Further, I certify that my clinical findings support that this patient is homebound due to:  Unsafe ambulation due to balance issues   Home Health    Complete by:  As directed    To provide the following care/treatments:   PT RN Social work          Signed: Torii Royse L   04/20/2016, 8:55 AM

## 2016-04-20 NOTE — Progress Notes (Signed)
Subjective: She feels okay. She has no new complaints. No chest pain. No nausea no vomiting.  Objective: Vital signs in last 24 hours: Temp:  [97.4 F (36.3 C)-97.9 F (36.6 C)] 97.8 F (36.6 C) (12/15 0509) Pulse Rate:  [62-103] 66 (12/15 0509) Resp:  [18-20] 20 (12/15 0509) BP: (89-122)/(54-96) 106/56 (12/15 0509) SpO2:  [94 %-100 %] 97 % (12/15 0509) Weight:  [78.7 kg (173 lb 8 oz)] 78.7 kg (173 lb 8 oz) (12/15 0509) Weight change: -2.494 kg (-5 lb 8 oz) Last BM Date: 04/19/16  Intake/Output from previous day: 12/14 0701 - 12/15 0700 In: 600 [P.O.:600] Out: -   PHYSICAL EXAM General appearance: alert and no distress Resp: clear to auscultation bilaterally Cardio: Still in atrial fibrillation GI: soft, non-tender; bowel sounds normal; no masses,  no organomegaly Extremities: extremities normal, atraumatic, no cyanosis or edema Skin warm and dry. Mucous membranes are moist  Lab Results:  Results for orders placed or performed during the hospital encounter of 04/18/16 (from the past 48 hour(s))  Basic metabolic panel     Status: Abnormal   Collection Time: 04/18/16 11:23 AM  Result Value Ref Range   Sodium 139 135 - 145 mmol/L   Potassium 3.4 (L) 3.5 - 5.1 mmol/L   Chloride 102 101 - 111 mmol/L   CO2 28 22 - 32 mmol/L   Glucose, Bld 110 (H) 65 - 99 mg/dL   BUN 13 6 - 20 mg/dL   Creatinine, Ser 0.75 0.44 - 1.00 mg/dL   Calcium 9.2 8.9 - 10.3 mg/dL   GFR calc non Af Amer >60 >60 mL/min   GFR calc Af Amer >60 >60 mL/min    Comment: (NOTE) The eGFR has been calculated using the CKD EPI equation. This calculation has not been validated in all clinical situations. eGFR's persistently <60 mL/min signify possible Chronic Kidney Disease.    Anion gap 9 5 - 15  CBC     Status: Abnormal   Collection Time: 04/18/16 11:23 AM  Result Value Ref Range   WBC 10.9 (H) 4.0 - 10.5 K/uL   RBC 4.66 3.87 - 5.11 MIL/uL   Hemoglobin 14.6 12.0 - 15.0 g/dL   HCT 44.0 36.0 - 46.0 %    MCV 94.4 78.0 - 100.0 fL   MCH 31.3 26.0 - 34.0 pg   MCHC 33.2 30.0 - 36.0 g/dL   RDW 14.3 11.5 - 15.5 %   Platelets 156 150 - 400 K/uL  Troponin I     Status: Abnormal   Collection Time: 04/18/16 11:23 AM  Result Value Ref Range   Troponin I 0.04 (HH) <0.03 ng/mL    Comment: CRITICAL RESULT CALLED TO, READ BACK BY AND VERIFIED WITH: EDWARDS,C AT 12:20PM ON 04/18/16 BY FESTERMAN,C   Protime-INR     Status: None   Collection Time: 04/18/16 11:44 AM  Result Value Ref Range   Prothrombin Time 13.0 11.4 - 15.2 seconds   INR 0.98   Urinalysis, Routine w reflex microscopic     Status: Abnormal   Collection Time: 04/18/16 12:25 PM  Result Value Ref Range   Color, Urine YELLOW YELLOW   APPearance TURBID (A) CLEAR   Specific Gravity, Urine 1.025 1.005 - 1.030   pH 6.0 5.0 - 8.0   Glucose, UA NEGATIVE NEGATIVE mg/dL   Hgb urine dipstick SMALL (A) NEGATIVE   Bilirubin Urine SMALL (A) NEGATIVE   Ketones, ur TRACE (A) NEGATIVE mg/dL   Protein, ur 30 (A) NEGATIVE mg/dL  Nitrite POSITIVE (A) NEGATIVE   Leukocytes, UA MODERATE (A) NEGATIVE  Urinalysis, Microscopic (reflex)     Status: Abnormal   Collection Time: 04/18/16 12:25 PM  Result Value Ref Range   RBC / HPF 0-5 0 - 5 RBC/hpf   WBC, UA TOO NUMEROUS TO COUNT 0 - 5 WBC/hpf   Bacteria, UA MANY (A) NONE SEEN   Squamous Epithelial / LPF 6-30 (A) NONE SEEN  Troponin I (q 6hr x 3)     Status: Abnormal   Collection Time: 04/18/16  3:21 PM  Result Value Ref Range   Troponin I 0.05 (HH) <0.03 ng/mL    Comment: CRITICAL RESULT CALLED TO, READ BACK BY AND VERIFIED WITH: ROBINSON,L AT 4:45PM ON 04/18/16 BY FESTERMAN,C   TSH     Status: Abnormal   Collection Time: 04/18/16  4:56 PM  Result Value Ref Range   TSH 5.611 (H) 0.350 - 4.500 uIU/mL    Comment: Performed by a 3rd Generation assay with a functional sensitivity of <=0.01 uIU/mL.  Troponin I (q 6hr x 3)     Status: Abnormal   Collection Time: 04/18/16  8:28 PM  Result Value Ref  Range   Troponin I 0.04 (HH) <0.03 ng/mL    Comment: CRITICAL VALUE NOTED.  VALUE IS CONSISTENT WITH PREVIOUSLY REPORTED AND CALLED VALUE.  MRSA PCR Screening     Status: None   Collection Time: 04/19/16  4:09 AM  Result Value Ref Range   MRSA by PCR NEGATIVE NEGATIVE    Comment:        The GeneXpert MRSA Assay (FDA approved for NASAL specimens only), is one component of a comprehensive MRSA colonization surveillance program. It is not intended to diagnose MRSA infection nor to guide or monitor treatment for MRSA infections.   Magnesium     Status: None   Collection Time: 04/19/16  4:26 AM  Result Value Ref Range   Magnesium 2.3 1.7 - 2.4 mg/dL  Basic metabolic panel     Status: Abnormal   Collection Time: 04/19/16  4:26 AM  Result Value Ref Range   Sodium 139 135 - 145 mmol/L   Potassium 4.7 3.5 - 5.1 mmol/L    Comment: DELTA CHECK NOTED   Chloride 105 101 - 111 mmol/L   CO2 28 22 - 32 mmol/L   Glucose, Bld 100 (H) 65 - 99 mg/dL   BUN 18 6 - 20 mg/dL   Creatinine, Ser 0.87 0.44 - 1.00 mg/dL   Calcium 8.8 (L) 8.9 - 10.3 mg/dL   GFR calc non Af Amer 58 (L) >60 mL/min   GFR calc Af Amer >60 >60 mL/min    Comment: (NOTE) The eGFR has been calculated using the CKD EPI equation. This calculation has not been validated in all clinical situations. eGFR's persistently <60 mL/min signify possible Chronic Kidney Disease.    Anion gap 6 5 - 15  CBC     Status: None   Collection Time: 04/19/16  4:26 AM  Result Value Ref Range   WBC 8.4 4.0 - 10.5 K/uL   RBC 4.05 3.87 - 5.11 MIL/uL   Hemoglobin 12.4 12.0 - 15.0 g/dL   HCT 38.5 36.0 - 46.0 %   MCV 95.1 78.0 - 100.0 fL   MCH 30.6 26.0 - 34.0 pg   MCHC 32.2 30.0 - 36.0 g/dL   RDW 14.2 11.5 - 15.5 %   Platelets 165 150 - 400 K/uL  Protime-INR     Status: None  Collection Time: 04/19/16  4:26 AM  Result Value Ref Range   Prothrombin Time 14.8 11.4 - 15.2 seconds   INR 1.15   Protime-INR     Status: None   Collection Time:  04/20/16  4:58 AM  Result Value Ref Range   Prothrombin Time 14.3 11.4 - 15.2 seconds   INR 1.11     ABGS No results for input(s): PHART, PO2ART, TCO2, HCO3 in the last 72 hours.  Invalid input(s): PCO2 CULTURES Recent Results (from the past 240 hour(s))  MRSA PCR Screening     Status: None   Collection Time: 04/19/16  4:09 AM  Result Value Ref Range Status   MRSA by PCR NEGATIVE NEGATIVE Final    Comment:        The GeneXpert MRSA Assay (FDA approved for NASAL specimens only), is one component of a comprehensive MRSA colonization surveillance program. It is not intended to diagnose MRSA infection nor to guide or monitor treatment for MRSA infections.    Studies/Results: Dg Chest 1 View  Result Date: 04/18/2016 CLINICAL DATA:  [place] last night, now with chest pain EXAM: CHEST 1 VIEW COMPARISON:  Chest x-ray of 02/10/2013 FINDINGS: No active infiltrate or effusion is seen. Mediastinal and hilar contours are unremarkable. Cardiomegaly is stable with dual lead permanent pacemaker present. The bones are osteopenic and there are considerable degenerative changes within the shoulders. IMPRESSION: 1. Stable cardiomegaly with permanent pacemaker. 2. No pneumonia or effusion. Electronically Signed   By: Ivar Drape M.D.   On: 04/18/2016 12:24   Dg Lumbar Spine Complete  Result Date: 04/18/2016 CLINICAL DATA:  [place] last night, chest pain EXAM: LUMBAR SPINE - COMPLETE 4+ VIEW COMPARISON:  Lumbar spine films of 02/10/2013 FINDINGS: The lumbar vertebrae are normal alignment. There is however diffuse degenerative disc disease throughout the lumbar spine primarily involving L3-4, L4-5, and L5-S1 levels with loss of disc space and sclerosis with spurring. No compression deformity is seen. Moderate abdominal aortic atherosclerotic change is present. The SI joints appear corticated. IMPRESSION: 1. Normal alignment. 2. Diffuse degenerative disc disease particularly at L3-4, L4-5, and L5-S1. 3.  Moderate abdominal aortic atherosclerosis. Electronically Signed   By: Ivar Drape M.D.   On: 04/18/2016 12:22   Ct Head Wo Contrast  Result Date: 04/18/2016 CLINICAL DATA:  Pt had a fall last night and was in the floor all night; pt not sure how she fell or if she had loc; EXAM: CT HEAD WITHOUT CONTRAST TECHNIQUE: Contiguous axial images were obtained from the base of the skull through the vertex without intravenous contrast. COMPARISON:  07/28/2013 FINDINGS: Brain: Diffuse parenchymal atrophy. 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 Patchy areas of hypoattenuation in deep and periventricular white matter bilaterally. Negative for acute intracranial hemorrhage, mass lesion, acute infarction, midline shift, or mass-effect. Acute infarct may be inapparent on noncontrast CT. Ventricles and sulci symmetric. 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 Atherosclerotic and physiologic intracranial calcifications. Skull: Previous left temporal craniotomy. Negative for fracture or other acute lesion. Sinuses/Orbits: Mucoperiosteal thickening in bilateral maxillary sinuses. Partial opacification of bilateral ethmoid air cells. Other: None. IMPRESSION: 1. Negative for bleed or other acute intracranial process. 2. Stable changes of surgical aneurysm clipping and left frontal infarct. Electronically Signed   By: Lucrezia Europe M.D.   On: 04/18/2016 14:44    Medications:  Prior to Admission:  Prescriptions Prior to Admission  Medication Sig Dispense Refill Last Dose  . allopurinol (ZYLOPRIM) 300 MG  tablet Take 300 mg by mouth Daily.    Past Week at Unknown time  . digoxin (LANOXIN) 0.125 MG tablet Take 0.0625 mg by mouth daily.   Past Week at Unknown time  . diltiazem (CARDIZEM CD) 240 MG 24 hr capsule Take 240 mg by mouth daily.     Past Week at Unknown time  . furosemide (LASIX) 40 MG tablet TAKE 1 TABLET (40 MG TOTAL) BY MOUTH DAILY. 30 tablet 11 Past Week at Unknown  time  . gabapentin (NEURONTIN) 300 MG capsule Take 300 mg by mouth at bedtime.     Past Week at Unknown time  . metoprolol succinate (TOPROL-XL) 100 MG 24 hr tablet TAKE 1 TABLET (100 MG TOTAL) BY MOUTH DAILY. 30 tablet 11 Past Week at Unknown time  . naproxen (NAPROSYN) 500 MG tablet Take 500 mg by mouth 2 (two) times daily with a meal.   Past Week at Unknown time  . pantoprazole (PROTONIX) 40 MG tablet Take 40 mg by mouth daily.   Past Week at Unknown time  . warfarin (COUMADIN) 5 MG tablet Take 1 tablet daily except 1 1/2 tablets on Tuesdays and Fridays 45 tablet 3 Past Week at Unknown time   Scheduled: . cefTRIAXone (ROCEPHIN)  IV  1 g Intravenous Q24H  . diltiazem  240 mg Oral Daily  . gabapentin  300 mg Oral QHS  . metoprolol succinate  100 mg Oral Daily  . pantoprazole  40 mg Oral Daily   Continuous:  PJA:[id number], HYDROcodone-acetaminophen, ondansetron **OR** ondansetron (ZOFRAN) IV  Assesment: She was admitted with urinary tract infection and atrial fib with rapid ventricular response. She is much better. She has some dementia at baseline. She is no longer a candidate for anticoagulation because of multiple falls and medical noncompliance. I think she's ready for discharge today Principal Problem:   UTI (urinary tract infection) Active Problems:   ATRIAL FIBRILLATION, CHRONIC   BRADYCARDIA-TACHYCARDIA SYNDROME   Cardiac pacemaker in situ   HYPOTENSION, ORTHOSTATIC   Long term current use of anticoagulant   A-fib ([place])    Plan: Discharge home with home health services    LOS: 2 days   Loriel Diehl L 04/20/2016, 8:48 AM

## 2016-04-20 NOTE — Progress Notes (Signed)
Pt has orders for discharge. Pt is to be discharged home with home health. Pt understands medication regimen and need for home healthcare. IV and telemetry removed. Pt has no furthers questions at this time. Family member present for discharge teaching and as transportation.  Quita SkyeMorgan P Dishmon, RN

## 2016-04-20 NOTE — Clinical Social Work Note (Signed)
Patient was recommended for ALF.  Patient not interested in placement. Patient to have HH/PT/SW/Aid/ services in home.       [name], Juleen ChinaHeather D, LCSW

## 2016-04-24 ENCOUNTER — Emergency Department (HOSPITAL_COMMUNITY): Payer: Medicare Other

## 2016-04-24 ENCOUNTER — Observation Stay (HOSPITAL_COMMUNITY)
Admission: EM | Admit: 2016-04-24 | Discharge: 2016-04-25 | Disposition: A | Discharge status: Home / Self Care | Payer: Medicare Other | Attending: Pulmonary Disease | Admitting: Pulmonary Disease

## 2016-04-24 ENCOUNTER — Encounter (HOSPITAL_COMMUNITY): Payer: Self-pay | Admitting: Emergency Medicine

## 2016-04-24 DIAGNOSIS — I4891 Unspecified atrial fibrillation: Secondary | ICD-10-CM | POA: Diagnosis present

## 2016-04-24 DIAGNOSIS — N3000 Acute cystitis without hematuria: Secondary | ICD-10-CM | POA: Diagnosis not present

## 2016-04-24 DIAGNOSIS — Z9181 History of falling: Secondary | ICD-10-CM

## 2016-04-24 DIAGNOSIS — N390 Urinary tract infection, site not specified: Secondary | ICD-10-CM | POA: Diagnosis present

## 2016-04-24 DIAGNOSIS — I481 Persistent atrial fibrillation: Secondary | ICD-10-CM | POA: Diagnosis not present

## 2016-04-24 DIAGNOSIS — R0602 Shortness of breath: Secondary | ICD-10-CM | POA: Diagnosis present

## 2016-04-24 DIAGNOSIS — Z91148 Patient's other noncompliance with medication regimen for other reason: Secondary | ICD-10-CM

## 2016-04-24 DIAGNOSIS — J449 Chronic obstructive pulmonary disease, unspecified: Secondary | ICD-10-CM | POA: Insufficient documentation

## 2016-04-24 DIAGNOSIS — Z79899 Other long term (current) drug therapy: Secondary | ICD-10-CM | POA: Diagnosis not present

## 2016-04-24 DIAGNOSIS — Z9114 Patient's other noncompliance with medication regimen: Secondary | ICD-10-CM | POA: Diagnosis not present

## 2016-04-24 DIAGNOSIS — F03918 Unspecified dementia, unspecified severity, with other behavioral disturbance: Secondary | ICD-10-CM | POA: Diagnosis present

## 2016-04-24 DIAGNOSIS — Z87891 Personal history of nicotine dependence: Secondary | ICD-10-CM | POA: Diagnosis not present

## 2016-04-24 DIAGNOSIS — I480 Paroxysmal atrial fibrillation: Principal | ICD-10-CM | POA: Insufficient documentation

## 2016-04-24 DIAGNOSIS — I10 Essential (primary) hypertension: Secondary | ICD-10-CM | POA: Diagnosis not present

## 2016-04-24 DIAGNOSIS — F0391 Unspecified dementia with behavioral disturbance: Secondary | ICD-10-CM | POA: Diagnosis present

## 2016-04-24 DIAGNOSIS — M6281 Muscle weakness (generalized): Secondary | ICD-10-CM

## 2016-04-24 LAB — COMPREHENSIVE METABOLIC PANEL
ALT: 16 U/L (ref 14–54)
AST: 18 U/L (ref 15–41)
Albumin: 3.6 g/dL (ref 3.5–5.0)
Alkaline Phosphatase: 73 U/L (ref 38–126)
Anion gap: 6 (ref 5–15)
BUN: 12 mg/dL (ref 6–20)
CO2: 27 mmol/L (ref 22–32)
Calcium: 8.7 mg/dL — ABNORMAL LOW (ref 8.9–10.3)
Chloride: 102 mmol/L (ref 101–111)
Creatinine, Ser: 0.94 mg/dL (ref 0.44–1.00)
GFR calc Af Amer: >60 mL/min (ref >60)
GFR calc non Af Amer: 53 mL/min — ABNORMAL LOW (ref >60)
Glucose, Bld: 80 mg/dL (ref 65–99)
Potassium: 3.9 mmol/L (ref 3.5–5.1)
Sodium: 135 mmol/L (ref 135–145)
Total Bilirubin: 1.1 mg/dL (ref 0.3–1.2)
Total Protein: 6.6 g/dL (ref 6.5–8.1)

## 2016-04-24 LAB — CBC WITH DIFFERENTIAL/PLATELET
Basophils Absolute: 0 10*3/uL (ref 0.0–0.1)
Basophils Relative: 0 %
Eosinophils Absolute: 0.1 10*3/uL (ref 0.0–0.7)
Eosinophils Relative: 1 %
HCT: 39.3 % (ref 36.0–46.0)
Hemoglobin: 13 g/dL (ref 12.0–15.0)
Lymphocytes Relative: 21 %
Lymphs Abs: 1.8 10*3/uL (ref 0.7–4.0)
MCH: 31.6 pg (ref 26.0–34.0)
MCHC: 33.1 g/dL (ref 30.0–36.0)
MCV: 95.4 fL (ref 78.0–100.0)
Monocytes Absolute: 0.5 10*3/uL (ref 0.1–1.0)
Monocytes Relative: 6 %
Neutro Abs: 6 10*3/uL (ref 1.7–7.7)
Neutrophils Relative %: 72 %
Platelets: 154 10*3/uL (ref 150–400)
RBC: 4.12 MIL/uL (ref 3.87–5.11)
RDW: 14.3 % (ref 11.5–15.5)
WBC: 8.4 10*3/uL (ref 4.0–10.5)

## 2016-04-24 LAB — PROTIME-INR
INR: 1.11
Prothrombin Time: 14.3 seconds (ref 11.4–15.2)

## 2016-04-24 LAB — TROPONIN I: Troponin I: <0.03 ng/mL (ref <0.03)

## 2016-04-24 MED ORDER — METOPROLOL SUCCINATE ER 50 MG PO TB24
100.0000 mg | ORAL_TABLET | Freq: Every day | ORAL | Status: DC
  Administered 2016-04-25: 100 mg via ORAL
  Filled 2016-04-24 (×2): qty 2
  Filled 2016-04-24: qty 1
  Filled 2016-04-24: qty 2

## 2016-04-24 MED ORDER — DIGOXIN 0.0625 MG HALF TABLET
0.0625 mg | ORAL_TABLET | Freq: Every day | ORAL | Status: DC
  Filled 2016-04-24: qty 1

## 2016-04-24 MED ORDER — ONDANSETRON HCL 4 MG/2ML IJ SOLN
4.0000 mg | Freq: Four times a day (QID) | INTRAMUSCULAR | Status: DC | PRN
Start: 2016-04-24 — End: 2016-04-25

## 2016-04-24 MED ORDER — ENOXAPARIN SODIUM 40 MG/0.4ML ~~LOC~~ SOLN
40.0000 mg | SUBCUTANEOUS | Status: DC
  Administered 2016-04-24: 40 mg via SUBCUTANEOUS
  Filled 2016-04-24: qty 0.4

## 2016-04-24 MED ORDER — DILTIAZEM HCL 25 MG/5ML IV SOLN: 10.0000 mg | Freq: Four times a day (QID) | INTRAVENOUS | Status: DC | PRN

## 2016-04-24 MED ORDER — DILTIAZEM HCL ER COATED BEADS 240 MG PO CP24: 240.0000 mg | ORAL_CAPSULE | Freq: Every day | ORAL | Status: DC

## 2016-04-24 MED ORDER — ACETAMINOPHEN 325 MG PO TABS: 650.0000 mg | ORAL_TABLET | ORAL | Status: DC | PRN

## 2016-04-24 MED ORDER — CEFUROXIME AXETIL 250 MG PO TABS
250.0000 mg | ORAL_TABLET | Freq: Two times a day (BID) | ORAL | Status: DC
  Administered 2016-04-24 – 2016-04-25 (×2): 250 mg via ORAL
  Filled 2016-04-24 (×2): qty 1

## 2016-04-24 MED ORDER — DILTIAZEM HCL 25 MG/5ML IV SOLN
10.0000 mg | Freq: Once | INTRAVENOUS | Status: AC
  Administered 2016-04-24: 10 mg via INTRAVENOUS
  Filled 2016-04-24: qty 5

## 2016-04-24 MED ORDER — DILTIAZEM HCL ER COATED BEADS 240 MG PO CP24
240.0000 mg | ORAL_CAPSULE | Freq: Every day | ORAL | Status: DC
  Administered 2016-04-25: 240 mg via ORAL
  Filled 2016-04-24: qty 1

## 2016-04-24 MED ORDER — METOPROLOL SUCCINATE ER 100 MG PO TB24: 100.0000 mg | ORAL_TABLET | Freq: Every day | ORAL | Status: DC

## 2016-04-24 NOTE — Progress Notes (Signed)
Discussed anticoagulation with admitting MD.  Will not order treatment dose Lovenox for Afib do to recent falls, non-compliance and determination last admission to not continue with long term anticoagulation.  Lovenox for VTE prophylaxis ordered at 40mg  SQ every 24 hours.  Mady GemmaHayes, Deamonte Sayegh R, [facility] IncRPH 04/24/2016 7:26 PM

## 2016-04-24 NOTE — H&P (Signed)
History and Physical    Tracy CocoJacqueline H Geske WGN:[id number] DOB: 04/04/1926 DOA: 04/24/2016  PCP: Fredirick MaudlinHAWKINS,EDWARD L, MD Consultants:  Ladona Ridgelaylor - EP Patient coming from: home - lives alone, husband died several years ago and doesn't have children.  NOK: brother-onlaw, [phone]  Chief Complaint: falls  HPI: [name] is a 82 y.o. female with medical history significant of afib no longer on anticoagulation, COPD, and likely dementia presents because she  "couldn't figure out what was going on".  She fell a couple of times and they had to pick her up at home.  No injuries.  No chest pain.  Hips are currently hurting from sitting on the bedpan.  Misplaces her medications frequently (by patient report as well as [facility] report).  She was previously admitted by Dr. Juanetta GoslingHawkins from 12/13-15 for UTI after falls.  At that time his note reports that she had stopped all of her medications at home.  She responded well and was discharged after treatment of UTI and with diltiazem resumption.  She was taken off anticoagulation and diltizaem during that hospitalization due to multiple falls and medical noncompliance.  They discussed placement but the patient refused.  ED Course: Per Dr. Estell HarpinZammit: Patient has atrial fibrillation with rapid rate. She was given some Cardizem in the emergency department and it was slowed down to about 100 bpm. I spoke with her primary care doctor Dr. Juanetta GoslingHawkins and he agrees to observation admission for further care.   Review of Systems: As per HPI; otherwise 10 point review of systems reviewed and negative.  Veracity of this report may be in question.   Ambulatory Status:  Ambulates with a cane  Past Medical History:  Diagnosis Date  . Arthritis   . Bilateral chronic knee pain   . Brain aneurysm   . COPD (chronic obstructive pulmonary disease) (HCC)   . DDD (degenerative disc disease), lumbar   . DJD (degenerative joint disease)   . DVT (deep venous thrombosis)  (HCC)   . Gout   . Peripheral neuropathy (HCC)   . Permanent atrial fibrillation [facility](HCC)    MDT [id number] pacemaker 03-26-2013 by Dr Ladona Ridgelaylor  . Pulmonary embolism [facility](HCC)     Past Surgical History:  Procedure Laterality Date  . CARDIAC CATHETERIZATION  2001  . Craniotomy clipping  06-2002   unruptured  . Left paraclinoid segment [facility]urysum     [facility]  . PACEMAKER INSERTION  09-13-1999; 03-26-2013   PPM gen change by Dr Ladona Ridgelaylor 03-26-2013 - MDT [id number]  . PERMANENT PACEMAKER GENERATOR CHANGE N/A 03/26/2013   Procedure: PERMANENT PACEMAKER GENERATOR CHANGE;  Surgeon: Marinus MawGregg W Taylor, MD;  [facility];  Service: Cardiovascular;  Laterality: N/A;    Social History   Social History  . Marital status: Widowed    Spouse name: N/A  . Number of children: N/A  . Years of education: N/A   Occupational History  . retired    Social History Main Topics  . Smoking status: Former Smoker    Packs/day: 0.25    Years: 35.00    Types: Cigarettes    Start date: 12/27/1977    Quit date: 05/26/2010  . Smokeless tobacco: Never Used  . Alcohol use No  . Drug use: No  . Sexual activity: Not on file   Other Topics Concern  . Not on file   Social History Narrative  . No narrative on file    Allergies  Allergen Reactions  . Bee Venom Swelling  .  Codeine     REACTION: GI distress    Family History  Problem Relation Age of Onset  . Heart attack Mother   . Heart attack Father   . Alzheimer's disease Sister     Prior to Admission medications   Medication Sig Start Date End Date Taking? Authorizing Provider  allopurinol (ZYLOPRIM) 300 MG tablet Take 300 mg by mouth Daily.  04/07/11  Yes Historical Provider, MD  cefUROXime (CEFTIN) 250 MG tablet Take 1 tablet (250 mg total) by mouth 2 (two) times daily with a meal. 04/20/16  Yes Kari Baars, MD  digoxin (LANOXIN) 0.125 MG tablet Take 0.0625 mg by mouth daily.   Yes Historical Provider, MD  diltiazem (CARDIZEM  CD) 240 MG 24 hr capsule Take 240 mg by mouth daily.     Yes Historical Provider, MD  furosemide (LASIX) 40 MG tablet TAKE 1 TABLET (40 MG TOTAL) BY MOUTH DAILY. 06/20/15  Yes Dyann Kief, PA-C  naproxen (NAPROSYN) 500 MG tablet Take 500 mg by mouth 2 (two) times daily with a meal.   Yes Historical Provider, MD  warfarin (COUMADIN) 5 MG tablet Take 5-7.5 mg by mouth daily. Take 7.5mg  on Tuesdays and Fridays only. Take 5mg  on all other days   Yes Historical Provider, MD  gabapentin (NEURONTIN) 300 MG capsule Take 300 mg by mouth at bedtime.      Historical Provider, MD  metoprolol succinate (TOPROL-XL) 100 MG 24 hr tablet TAKE 1 TABLET (100 MG TOTAL) BY MOUTH DAILY. Patient not taking: Reported on 04/24/2016 06/20/15   Dyann Kief, PA-C    Physical Exam: Vitals:   04/24/16 1700 04/24/16 1747 04/24/16 1843 04/24/16 2052  BP: 108/90 101/64 127/78 125/68  Pulse: 105 91 (!) 105 100  Resp:   20 18  Temp:  97.3 F (36.3 C) 98.7 F (37.1 C) 98 F (36.7 C)  TempSrc:  Oral Oral Oral  SpO2:  96% 100% 97%  Weight:   76.4 kg (168 lb 8 oz)   Height:   5\' 8"  (1.727 m)      General: Appears calm and comfortable and is NAD Eyes:  PERRL, EOMI, normal lids, iris ENT:  grossly normal hearing, lips & tongue, mmm Neck:  no LAD, masses or thyromegaly Cardiovascular:  Irregularly irregular, rate about 100,  no m/r/g. No LE edema.  Respiratory: CTA bilaterally, no w/r/r. Normal respiratory effort. Abdomen:  soft, ntnd, NABS Skin:  no rash or induration seen on limited exam Musculoskeletal:  grossly normal tone BUE/BLE, good ROM, no bony abnormality Psychiatric:  grossly normal mood and affect, speech fluent and appropriate, AOx3 Neurologic:  CN 2-12 grossly intact, moves all extremities in coordinated fashion, sensation intact  Labs on Admission: I have personally reviewed following labs and imaging studies  CBC:  Recent Labs Lab 04/18/16 1123 04/19/16 0426 04/24/16 1522  WBC 10.9* 8.4  8.4  NEUTROABS  --   --  6.0  HGB 14.6 12.4 13.0  HCT 44.0 38.5 39.3  MCV 94.4 95.1 95.4  PLT 156 165 154   Basic Metabolic Panel:  Recent Labs Lab 04/18/16 1123 04/19/16 0426 04/24/16 1522  NA 139 139 135  K 3.4* 4.7 3.9  CL 102 105 102  CO2 28 28 27   GLUCOSE 110* 100* 80  BUN 13 18 12   CREATININE 0.75 0.87 0.94  CALCIUM 9.2 8.8* 8.7*  MG  --  2.3  --    GFR: Estimated Creatinine Clearance: 41.7 mL/min (by C-G formula based on  SCr of 0.94 mg/dL). Liver Function Tests:  Recent Labs Lab 04/24/16 1522  AST 18  ALT 16  ALKPHOS 73  BILITOT 1.1  PROT 6.6  ALBUMIN 3.6   No results for input(s): LIPASE, AMYLASE in the last 168 hours. No results for input(s): AMMONIA in the last 168 hours. Coagulation Profile:  Recent Labs Lab 04/18/16 1144 04/19/16 0426 04/20/16 0458 04/24/16 1522  INR 0.98 1.15 1.11 1.11   Cardiac Enzymes:  Recent Labs Lab 04/18/16 1123 04/18/16 1521 04/18/16 2028 04/24/16 1522  TROPONINI 0.04* 0.05* 0.04* <0.03   BNP (last 3 results) No results for input(s): PROBNP in the last 8760 hours. HbA1C: No results for input(s): HGBA1C in the last 72 hours. CBG: No results for input(s): GLUCAP in the last 168 hours. Lipid Profile: No results for input(s): CHOL, HDL, LDLCALC, TRIG, CHOLHDL, LDLDIRECT in the last 72 hours. Thyroid Function Tests: No results for input(s): TSH, T4TOTAL, FREET4, T3FREE, THYROIDAB in the last 72 hours. Anemia Panel: No results for input(s): VITAMINB12, FOLATE, FERRITIN, TIBC, IRON, RETICCTPCT in the last 72 hours. Urine analysis:    Component Value Date/Time   COLORURINE YELLOW 04/18/2016 1225   APPEARANCEUR TURBID (A) 04/18/2016 1225   LABSPEC 1.025 04/18/2016 1225   PHURINE 6.0 04/18/2016 1225   GLUCOSEU NEGATIVE 04/18/2016 1225   HGBUR SMALL (A) 04/18/2016 1225   BILIRUBINUR SMALL (A) 04/18/2016 1225   KETONESUR TRACE (A) 04/18/2016 1225   PROTEINUR 30 (A) 04/18/2016 1225   UROBILINOGEN >8.0 (H)  02/10/2013 1845   NITRITE POSITIVE (A) 04/18/2016 1225   LEUKOCYTESUR MODERATE (A) 04/18/2016 1225    Creatinine Clearance: Estimated Creatinine Clearance: 41.7 mL/min (by C-G formula based on SCr of 0.94 mg/dL).  Sepsis Labs: @LABRCNTIP (procalcitonin:4,lacticidven:4) ) Recent Results (from the past 240 hour(s))  Urine culture     Status: Abnormal (Preliminary result)   Collection Time: 04/18/16 12:25 PM  Result Value Ref Range Status   Specimen Description URINE, CATHETERIZED  Final   Special Requests NONE  Final   Culture (A)  Final    >=100,000 COLONIES/mL ESCHERICHIA COLI CULTURE REINCUBATED FOR BETTER GROWTH Performed at [facility]    Report Status PENDING  Incomplete   Organism ID, Bacteria ESCHERICHIA COLI (A)  Final      Susceptibility   Escherichia coli - MIC*    AMPICILLIN 4 SENSITIVE Sensitive     CEFAZOLIN <=4 SENSITIVE Sensitive     CEFTRIAXONE <=1 SENSITIVE Sensitive     CIPROFLOXACIN <=0.25 SENSITIVE Sensitive     GENTAMICIN <=1 SENSITIVE Sensitive     IMIPENEM <=0.25 SENSITIVE Sensitive     NITROFURANTOIN 32 SENSITIVE Sensitive     TRIMETH/SULFA <=20 SENSITIVE Sensitive     AMPICILLIN/SULBACTAM <=2 SENSITIVE Sensitive     PIP/TAZO <=4 SENSITIVE Sensitive     Extended ESBL NEGATIVE Sensitive     * >=100,000 COLONIES/mL ESCHERICHIA COLI  MRSA PCR Screening     Status: None   Collection Time: 04/19/16  4:09 AM  Result Value Ref Range Status   MRSA by PCR NEGATIVE NEGATIVE Final    Comment:        The GeneXpert MRSA Assay (FDA approved for NASAL specimens only), is one component of a comprehensive MRSA colonization surveillance program. It is not intended to diagnose MRSA infection nor to guide or monitor treatment for MRSA infections.      Radiological Exams on Admission: Dg Chest 2 View  Result Date: 04/24/2016 CLINICAL DATA:  82 year old female shortness of breath and confusion.  Initial encounter. EXAM: CHEST  2 VIEW COMPARISON:   04/18/2016 and earlier. FINDINGS: Chronic right chest 2 lead cardiac pacemaker appear stable. There is cardiomegaly. Other mediastinal contours are within normal limits. Visualized tracheal air column is within normal limits. Large lung volumes. No pneumothorax, pulmonary edema, pleural effusion or acute pulmonary opacity. Stable mild linear scarring in the lingula. Osteopenia. No acute osseous abnormality identified. Calcified aortic atherosclerosis. Stable cholecystectomy clips. IMPRESSION: Stable cardiomegaly. No acute cardiopulmonary abnormality. Calcified aortic atherosclerosis. Electronically Signed   By: Odessa FlemingH  Hall M.D.   On: 04/24/2016 16:18    EKG: Independently reviewed.  Afib with rate 108; nonspecific ST changes with no evidence of acute ischemia  Assessment/Plan Active Problems:   ATRIAL FIBRILLATION, CHRONIC   UTI (urinary tract infection)   Noncompliance with medication regimen   Afib -Patient with recent hospitalization for UTI and not taking her chronic medications -Uncertain compliance after patient returned home -Returns with similar symptoms -Was in RVR upon presentation but this improved with 1 dose of IV Diltiazem and resumption of PO medications (Dilt, Toprol) -Patient is no longer an anticoagulant candidate -Denies chest pain or other symptoms -Will place overnight in observation status on telemetry  Noncompliance -This is very concerning and may put the patient at significant risk -APS consult -SW consult for possible placement  UTI -Continue Ceftin   DVT prophylaxis:  Lovenox Code Status: DNR - confirmed with patient Family Communication: None present Disposition Plan: To be determined Consults called: SW Admission status: It is my clinical opinion that referral for OBSERVATION is reasonable and necessary in this patient based on the above information provided. The aforementioned taken together are felt to place the patient at high risk for further clinical  deterioration. However it is anticipated that the patient may be medically stable for discharge from the hospital within 24 to 48 hours.   *Patient will be seen by Dr. Juanetta GoslingHawkins tomorrow*  Jonah BlueJennifer Cameryn Schum MD [facility]  If 7PM-7AM, please contact night-coverage [email or web address] Password [email or web address]  04/24/2016, 9:46 PM

## 2016-04-24 NOTE — ED Triage Notes (Signed)
[facility] states the pt was picked up in her [place] [employer] for c/o SOB that started after walking to her car today. EMS reports increased confusion over the past week and her house is very cluttered and living conditions are poor. PT also states she hasn't been taking her medications lately because she can't locate them.

## 2016-04-24 NOTE — ED Provider Notes (Signed)
[facility]-EMERGENCY DEPT Provider Note   CSN: [id number] Arrival date & time: 04/24/16  1429     History   Chief Complaint Chief Complaint  Patient presents with  . Shortness of Breath    HPI [name] is a 82 y.o. female.  Patient states that she had some chest discomfort and shortness of breath today. She lives alone and was recently admitted to the hospital for a total fluid. She's misplaced her medicines and has not taken them in a couple days   The history is provided by the patient. No language interpreter was used.  Shortness of Breath  This is a new problem. The problem occurs frequently.The current episode started yesterday. The problem has not changed since onset.Associated symptoms include chest pain. Pertinent negatives include no fever, no headaches, no cough, no abdominal pain and no rash. It is unknown what precipitated the problem. She has tried nothing for the symptoms.    Past Medical History:  Diagnosis Date  . Arthritis   . Bilateral chronic knee pain   . Brain aneurysm   . COPD (chronic obstructive pulmonary disease) (HCC)   . DDD (degenerative disc disease), lumbar   . DJD (degenerative joint disease)   . DVT (deep venous thrombosis) (HCC)   . Gout   . Peripheral neuropathy (HCC)   . Permanent atrial fibrillation [facility](HCC)    MDT [id number] pacemaker 03-26-2013 by Dr Ladona Ridgelaylor  . Pulmonary embolism [facility](HCC)     Patient Active Problem List   Diagnosis Date Noted  . UTI (urinary tract infection) 04/18/2016  . A-fib (HCC) 04/18/2016  . Chest pain 02/10/2014  . Edema 02/10/2014  . Encounter for therapeutic drug monitoring 08/13/2013  . Long term current use of anticoagulant 07/22/2010  . HYPOTENSION, ORTHOSTATIC 06/15/2010  . Essential hypertension, benign 04/06/2009  . Cardiac pacemaker in situ 04/06/2009  . PULMONARY EMBOLISM 01/18/2009  . ATRIAL FIBRILLATION, CHRONIC 01/18/2009  . BRADYCARDIA-TACHYCARDIA SYNDROME 01/18/2009  . DVT 01/18/2009     Past Surgical History:  Procedure Laterality Date  . CARDIAC CATHETERIZATION  2001  . Craniotomy clipping  06-2002   unruptured  . Left paraclinoid segment anurysum     [facility]  . PACEMAKER INSERTION  09-13-1999; 03-26-2013   PPM gen change by Dr Ladona Ridgelaylor 03-26-2013 - MDT [id number]  . PERMANENT PACEMAKER GENERATOR CHANGE N/A 03/26/2013   Procedure: PERMANENT PACEMAKER GENERATOR CHANGE;  Surgeon: Marinus MawGregg W Taylor, MD;  [facility];  Service: Cardiovascular;  Laterality: N/A;    OB History    No data available       Home Medications    Prior to Admission medications   Medication Sig Start Date End Date Taking? Authorizing Provider  allopurinol (ZYLOPRIM) 300 MG tablet Take 300 mg by mouth Daily.  04/07/11   Historical Provider, MD  cefUROXime (CEFTIN) 250 MG tablet Take 1 tablet (250 mg total) by mouth 2 (two) times daily with a meal. 04/20/16   Kari BaarsEdward Hawkins, MD  diltiazem (CARDIZEM CD) 240 MG 24 hr capsule Take 240 mg by mouth daily.      Historical Provider, MD  furosemide (LASIX) 40 MG tablet TAKE 1 TABLET (40 MG TOTAL) BY MOUTH DAILY. 06/20/15   Dyann KiefMichele M Lenze, PA-C  gabapentin (NEURONTIN) 300 MG capsule Take 300 mg by mouth at bedtime.      Historical Provider, MD  metoprolol succinate (TOPROL-XL) 100 MG 24 hr tablet TAKE 1 TABLET (100 MG TOTAL) BY MOUTH DAILY. 06/20/15   Elon JesterMichele  M Lenze, PA-C  naproxen (NAPROSYN) 500 MG tablet Take 500 mg by mouth 2 (two) times daily with a meal.    Historical Provider, MD  pantoprazole (PROTONIX) 40 MG tablet Take 40 mg by mouth daily.    Historical Provider, MD    Family History Family History  Problem Relation Age of Onset  . Heart attack Mother   . Heart attack Father     Social History Social History  Substance Use Topics  . Smoking status: Former Smoker    Packs/day: 0.25    Years: 35.00    Types: Cigarettes    Start date: 12/27/1977    Quit date: 05/26/2010  . Smokeless tobacco: Never Used  .  Alcohol use No     Allergies   Bee venom and Codeine   Review of Systems Review of Systems  Constitutional: Negative for appetite change, fatigue and fever.  HENT: Negative for congestion, ear discharge and sinus pressure.   Eyes: Negative for discharge.  Respiratory: Positive for shortness of breath. Negative for cough.   Cardiovascular: Positive for chest pain.  Gastrointestinal: Negative for abdominal pain and diarrhea.  Genitourinary: Negative for frequency and hematuria.  Musculoskeletal: Negative for back pain.  Skin: Negative for rash.  Neurological: Negative for seizures and headaches.  Psychiatric/Behavioral: Negative for hallucinations.     Physical Exam Updated Vital Signs BP 108/90   Pulse (!) 56   Temp 97.6 F (36.4 C)   Resp 23   Ht 5\' 8"  (1.727 m)   Wt 170 lb (77.1 kg)   SpO2 92%   BMI 25.85 kg/m   Physical Exam  Constitutional: She is oriented to person, place, and time. She appears well-developed.  HENT:  Head: Normocephalic.  Eyes: Conjunctivae and EOM are normal. No scleral icterus.  Neck: Neck supple. No thyromegaly present.  Cardiovascular: Exam reveals no gallop and no friction rub.   No murmur heard. Irregular rapid rate  Pulmonary/Chest: No stridor. She has no wheezes. She has no rales. She exhibits no tenderness.  Abdominal: She exhibits no distension. There is no tenderness. There is no rebound.  Musculoskeletal: Normal range of motion. She exhibits no edema.  Lymphadenopathy:    She has no cervical adenopathy.  Neurological: She is oriented to person, place, and time. She exhibits normal muscle tone. Coordination normal.  Skin: No rash noted. No erythema.  Psychiatric: She has a normal mood and affect. Her behavior is normal.     ED Treatments / Results  Labs (all labs ordered are listed, but only abnormal results are displayed) Labs Reviewed  COMPREHENSIVE METABOLIC PANEL - Abnormal; Notable for the following:       Result  Value   Calcium 8.7 (*)    GFR calc non Af Amer 53 (*)    All other components within normal limits  CBC WITH DIFFERENTIAL/PLATELET  TROPONIN I    EKG  EKG Interpretation None       Radiology Dg Chest 2 View  Result Date: 04/24/2016 CLINICAL DATA:  82 year old female shortness of breath and confusion. Initial encounter. EXAM: CHEST  2 VIEW COMPARISON:  04/18/2016 and earlier. FINDINGS: Chronic right chest 2 lead cardiac pacemaker appear stable. There is cardiomegaly. Other mediastinal contours are within normal limits. Visualized tracheal air column is within normal limits. Large lung volumes. No pneumothorax, pulmonary edema, pleural effusion or acute pulmonary opacity. Stable mild linear scarring in the lingula. Osteopenia. No acute osseous abnormality identified. Calcified aortic atherosclerosis. Stable cholecystectomy clips. IMPRESSION: Stable cardiomegaly.  No acute cardiopulmonary abnormality. Calcified aortic atherosclerosis. Electronically Signed   By: Odessa Fleming M.D.   On: 04/24/2016 16:18    Procedures Procedures (including critical care time)  Medications Ordered in ED Medications  diltiazem (CARDIZEM CD) 24 hr capsule 240 mg (not administered)  metoprolol succinate (TOPROL-XL) 24 hr tablet 100 mg (not administered)  diltiazem (CARDIZEM) injection 10 mg (10 mg Intravenous Given 04/24/16 1551)     Initial Impression / Assessment and Plan / ED Course  I have reviewed the triage vital signs and the nursing notes.  Pertinent labs & imaging results that were available during my care of the patient were reviewed by me and considered in my medical decision making (see chart for details).  Clinical Course     Patient has atrial fibrillation with rapid rate. She was given some Cardizem in the emergency department and it was slowed down to about 100 bpm. I spoke with her primary care doctor Dr. Juanetta Gosling and he agrees to observation admission for further care.  Final Clinical  Impressions(s) / ED Diagnoses   Final diagnoses:  Atrial fibrillation with rapid ventricular response [facility])    New Prescriptions New Prescriptions   No medications on file     Bethann Berkshire, MD 04/24/16 1728

## 2016-04-25 DIAGNOSIS — F03918 Unspecified dementia, unspecified severity, with other behavioral disturbance: Secondary | ICD-10-CM | POA: Diagnosis present

## 2016-04-25 DIAGNOSIS — I4891 Unspecified atrial fibrillation: Secondary | ICD-10-CM | POA: Diagnosis not present

## 2016-04-25 DIAGNOSIS — F0391 Unspecified dementia with behavioral disturbance: Secondary | ICD-10-CM | POA: Diagnosis present

## 2016-04-25 LAB — CBC
HCT: 36.5 % (ref 36.0–46.0)
Hemoglobin: 12.1 g/dL (ref 12.0–15.0)
MCH: 31.7 pg (ref 26.0–34.0)
MCHC: 33.2 g/dL (ref 30.0–36.0)
MCV: 95.5 fL (ref 78.0–100.0)
Platelets: 153 10*3/uL (ref 150–400)
RBC: 3.82 MIL/uL — ABNORMAL LOW (ref 3.87–5.11)
RDW: 14.5 % (ref 11.5–15.5)
WBC: 6.8 10*3/uL (ref 4.0–10.5)

## 2016-04-25 LAB — URINE CULTURE: Culture: >=100000 — AB

## 2016-04-25 LAB — VITAMIN B12: Vitamin B-12: 215 pg/mL (ref 180–914)

## 2016-04-25 MED ORDER — DONEPEZIL HCL 5 MG PO TABS: 5.0000 mg | ORAL_TABLET | Freq: Every day | ORAL | Status: DC

## 2016-04-25 MED ORDER — DONEPEZIL HCL 5 MG PO TABS: 5.0000 mg | ORAL_TABLET | Freq: Every day | ORAL | 12 refills | Status: DC

## 2016-04-25 NOTE — Care Management Note (Signed)
Case Management Note  Patient Details  Name: [name] MRN: [id number] Date of Birth: [birth year]  Subjective/Objective:                  Pt from home, lives alone and was previously DC'd. [facility]. Pt's BIL interested in placement. Pt alert and oriented (with periods of confusion) and refuses ALF placement. PT has recommended ALF/24/7 supervision. Pt reports this is not possible and still choses to DC home. [facility] tomorrow. Pt discharging home today. Admitted as observation and does not require orders to resume services.   Action/Plan: [facility].   Expected Discharge Date:     04/25/2016             Expected Discharge Plan:  Home w Home Health Services  In-House Referral:  Clinical Social Work  Discharge planning Services  CM Consult  Post Acute Care Choice:  Home Health, Resumption of Svcs/PTA Provider Choice offered to:  Patient  HH Arranged:  Charity fundraiserN, PT, Nurse's Aide, Social Work [facility] Agency:  [facility]  Status of Service:  Completed, signed off Malcolm MetroChildress, Orlena Garmon Demske, RN 04/25/2016, 2:10 PM

## 2016-04-25 NOTE — Discharge Summary (Signed)
Physician Discharge Summary  Patient ID: [name] MRN: [id number] DOB/AGE: 1925-06-01 82 y.o. Primary Care Physician:Liberti Appleton L, MD Admit date: 04/24/2016 Discharge date: 04/25/2016    Discharge Diagnoses:   Active Problems:   ATRIAL FIBRILLATION, CHRONIC   UTI (urinary tract infection)   Noncompliance with medication regimen   Dementia with behavioral disturbance   Allergies as of 04/25/2016      Reactions   Bee Venom Swelling   Codeine    REACTION: GI distress      Medication List    TAKE these medications   allopurinol 300 MG tablet Commonly known as:  ZYLOPRIM Take 300 mg by mouth Daily.   cefUROXime 250 MG tablet Commonly known as:  CEFTIN Take 1 tablet (250 mg total) by mouth 2 (two) times daily with a meal.   diltiazem 240 MG 24 hr capsule Commonly known as:  CARDIZEM CD Take 240 mg by mouth daily.   donepezil 5 MG tablet Commonly known as:  ARICEPT Take 1 tablet (5 mg total) by mouth at bedtime.   furosemide 40 MG tablet Commonly known as:  LASIX TAKE 1 TABLET (40 MG TOTAL) BY MOUTH DAILY.   metoprolol succinate 100 MG 24 hr tablet Commonly known as:  TOPROL-XL TAKE 1 TABLET (100 MG TOTAL) BY MOUTH DAILY.   naproxen 500 MG tablet Commonly known as:  NAPROSYN Take 500 mg by mouth 2 (two) times daily with a meal.       Discharged Condition:Unchanged/improved    Consults: None  Significant Diagnostic Studies: Dg Chest 1 View  Result Date: 04/18/2016 CLINICAL DATA:  Larey SeatFell last night, now with chest pain EXAM: CHEST 1 VIEW COMPARISON:  Chest x-ray of 02/10/2013 FINDINGS: No active infiltrate or effusion is seen. Mediastinal and hilar contours are unremarkable. Cardiomegaly is stable with dual lead permanent pacemaker present. The bones are osteopenic and there are considerable degenerative changes within the shoulders. IMPRESSION: 1. Stable cardiomegaly with permanent pacemaker. 2. No pneumonia or effusion. Electronically Signed    By: Dwyane DeePaul  Barry M.D.   On: 04/18/2016 12:24   Dg Chest 2 View  Result Date: 04/24/2016 CLINICAL DATA:  82 year old female shortness of breath and confusion. Initial encounter. EXAM: CHEST  2 VIEW COMPARISON:  04/18/2016 and earlier. FINDINGS: Chronic right chest 2 lead cardiac pacemaker appear stable. There is cardiomegaly. Other mediastinal contours are within normal limits. Visualized tracheal air column is within normal limits. Large lung volumes. No pneumothorax, pulmonary edema, pleural effusion or acute pulmonary opacity. Stable mild linear scarring in the lingula. Osteopenia. No acute osseous abnormality identified. Calcified aortic atherosclerosis. Stable cholecystectomy clips. IMPRESSION: Stable cardiomegaly. No acute cardiopulmonary abnormality. Calcified aortic atherosclerosis. Electronically Signed   By: Odessa FlemingH  Hall M.D.   On: 04/24/2016 16:18   Dg Lumbar Spine Complete  Result Date: 04/18/2016 CLINICAL DATA:  Larey SeatFell last night, chest pain EXAM: LUMBAR SPINE - COMPLETE 4+ VIEW COMPARISON:  Lumbar spine films of 02/10/2013 FINDINGS: The lumbar vertebrae are normal alignment. There is however diffuse degenerative disc disease throughout the lumbar spine primarily involving L3-4, L4-5, and L5-S1 levels with loss of disc space and sclerosis with spurring. No compression deformity is seen. Moderate abdominal aortic atherosclerotic change is present. The SI joints appear corticated. IMPRESSION: 1. Normal alignment. 2. Diffuse degenerative disc disease particularly at L3-4, L4-5, and L5-S1. 3. Moderate abdominal aortic atherosclerosis. Electronically Signed   By: Dwyane DeePaul  Barry M.D.   On: 04/18/2016 12:22   Ct Head Wo Contrast  Result Date: 04/18/2016 CLINICAL DATA:  Pt had a fall last night and was in the floor all night; pt not sure how she fell or if she had loc; EXAM: CT HEAD WITHOUT CONTRAST TECHNIQUE: Contiguous axial images were obtained from the base of the skull through the vertex without  intravenous contrast. COMPARISON:  07/28/2013 FINDINGS: Brain: Diffuse parenchymal atrophy. 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 Patchy areas of hypoattenuation in deep and periventricular white matter bilaterally. Negative for acute intracranial hemorrhage, mass lesion, acute infarction, midline shift, or mass-effect. Acute infarct may be inapparent on noncontrast CT. Ventricles and sulci symmetric. 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 Atherosclerotic and physiologic intracranial calcifications. Skull: Previous left temporal craniotomy. Negative for fracture or other acute lesion. Sinuses/Orbits: Mucoperiosteal thickening in bilateral maxillary sinuses. Partial opacification of bilateral ethmoid air cells. Other: None. IMPRESSION: 1. Negative for bleed or other acute intracranial process. 2. Stable changes of surgical aneurysm clipping and left frontal infarct. Electronically Signed   By: Corlis Leak  Hassell M.D.   On: 04/18/2016 14:44    Lab Results: Basic Metabolic Panel:  Recent Labs  16/02/9611/19/17 1522  NA 135  K 3.9  CL 102  CO2 27  GLUCOSE 80  BUN 12  CREATININE 0.94  CALCIUM 8.7*   Liver Function Tests:  Recent Labs  04/24/16 1522  AST 18  ALT 16  ALKPHOS 73  BILITOT 1.1  PROT 6.6  ALBUMIN 3.6     CBC:  Recent Labs  04/24/16 1522 04/25/16 0417  WBC 8.4 6.8  NEUTROABS 6.0  --   HGB 13.0 12.1  HCT 39.3 36.5  MCV 95.4 95.5  PLT 154 153    Recent Results (from the past 240 hour(s))  Urine culture     Status: Abnormal   Collection Time: 04/18/16 12:25 PM  Result Value Ref Range Status   Specimen Description URINE, CATHETERIZED  Final   Special Requests NONE  Final   Culture (A)  Final    >=100,000 COLONIES/mL ESCHERICHIA COLI >=100,000 COLONIES/mL AEROCOCCUS URINAE Standardized susceptibility testing for this organism is not available. Performed at [facility]    Report Status 04/25/2016 FINAL  Final    Organism ID, Bacteria ESCHERICHIA COLI (A)  Final      Susceptibility   Escherichia coli - MIC*    AMPICILLIN 4 SENSITIVE Sensitive     CEFAZOLIN <=4 SENSITIVE Sensitive     CEFTRIAXONE <=1 SENSITIVE Sensitive     CIPROFLOXACIN <=0.25 SENSITIVE Sensitive     GENTAMICIN <=1 SENSITIVE Sensitive     IMIPENEM <=0.25 SENSITIVE Sensitive     NITROFURANTOIN 32 SENSITIVE Sensitive     TRIMETH/SULFA <=20 SENSITIVE Sensitive     AMPICILLIN/SULBACTAM <=2 SENSITIVE Sensitive     PIP/TAZO <=4 SENSITIVE Sensitive     Extended ESBL NEGATIVE Sensitive     * >=100,000 COLONIES/mL ESCHERICHIA COLI  MRSA PCR Screening     Status: None   Collection Time: 04/19/16  4:09 AM  Result Value Ref Range Status   MRSA by PCR NEGATIVE NEGATIVE Final    Comment:        The GeneXpert MRSA Assay (FDA approved for NASAL specimens only), is one component of a comprehensive MRSA colonization surveillance program. It is not intended to diagnose MRSA infection nor to guide or monitor treatment for MRSA infections.      Hospital Course: This is an 82 year old who lives at home alone. She had been in the hospital about a week ago with atrial  fibrillation rapid ventricular response and a urinary tract infection. She was felt to need placement at that time but she refused. Her competence is somewhat questionable but she clearly knows what she wants and what she does not. She does have some element of dementia. She came back after going to a restaurant and being noted to be somewhat confused. She apparently did not fill her medications. EMS says at her home is in disarray. Discussed placement again and she again refuses. I started on Aricept.  Discharge Exam: Blood pressure (!) 109/40, pulse 91, temperature 97.3 F (36.3 C), temperature source Oral, resp. rate 17, height 5\' 8"  (1.727 m), weight 74 kg (163 lb 3.2 oz), SpO2 95 %. Minimally confused but adamant about what she wants. She is in atrial fib. Lungs are  clear.  Disposition: Home. Ideal would be for her to have placement which what I think is ultimately going to happen. Hopefully she will get her medications. Home health services in the meantime. Reported to Adult Protective Services who may be able to involuntarily place her in a skilled care facility or assisted living facility      Signed: Florean Hoobler L   04/25/2016, 2:16 PM

## 2016-04-25 NOTE — Progress Notes (Signed)
Subjective: She was brought in for observation last night after being found with atrial fibrillation rapid ventricular response again. After she was discharged last week my office tried on several occasions to get in touch with her but was unable to do so. EMS reports that her house is in disarray. She says that she got her medication after her admission last week and had it in a bag but she has lost the bag. I'm not sure that she actually filled the prescriptions and I will check with the drugstore.  Objective: Vital signs in last 24 hours: Temp:  [97.3 F (36.3 C)-98.7 F (37.1 C)] 97.3 F (36.3 C) (12/20 0447) Pulse Rate:  [56-116] 91 (12/20 0447) Resp:  [17-23] 17 (12/20 0447) BP: (101-127)/(40-90) 109/40 (12/20 0447) SpO2:  [88 %-100 %] 98 % (12/20 0447) Weight:  [74 kg (163 lb 3.2 oz)-77.1 kg (170 lb)] 74 kg (163 lb 3.2 oz) (12/20 0447) Weight change:  Last BM Date: 04/22/16  Intake/Output from previous day: No intake/output data recorded.  PHYSICAL EXAM General appearance: alert, no distress and Unfocused Resp: clear to auscultation bilaterally Cardio: She is in atrial fib with better control GI: soft, non-tender; bowel sounds normal; no masses,  no organomegaly Extremities: extremities normal, atraumatic, no cyanosis or edema Skin warm and dry. Mucous membranes are moist. Pupils react.  Lab Results:  Results for orders placed or performed during the hospital encounter of 04/24/16 (from the past 48 hour(s))  CBC with Differential/Platelet     Status: None   Collection Time: 04/24/16  3:22 PM  Result Value Ref Range   WBC 8.4 4.0 - 10.5 K/uL   RBC 4.12 3.87 - 5.11 MIL/uL   Hemoglobin 13.0 12.0 - 15.0 g/dL   HCT 39.3 36.0 - 46.0 %   MCV 95.4 78.0 - 100.0 fL   MCH 31.6 26.0 - 34.0 pg   MCHC 33.1 30.0 - 36.0 g/dL   RDW 14.3 11.5 - 15.5 %   Platelets 154 150 - 400 K/uL   Neutrophils Relative % 72 %   Neutro Abs 6.0 1.7 - 7.7 K/uL   Lymphocytes Relative 21 %   Lymphs  Abs 1.8 0.7 - 4.0 K/uL   Monocytes Relative 6 %   Monocytes Absolute 0.5 0.1 - 1.0 K/uL   Eosinophils Relative 1 %   Eosinophils Absolute 0.1 0.0 - 0.7 K/uL   Basophils Relative 0 %   Basophils Absolute 0.0 0.0 - 0.1 K/uL  Comprehensive metabolic panel     Status: Abnormal   Collection Time: 04/24/16  3:22 PM  Result Value Ref Range   Sodium 135 135 - 145 mmol/L   Potassium 3.9 3.5 - 5.1 mmol/L   Chloride 102 101 - 111 mmol/L   CO2 27 22 - 32 mmol/L   Glucose, Bld 80 65 - 99 mg/dL   BUN 12 6 - 20 mg/dL   Creatinine, Ser 0.94 0.44 - 1.00 mg/dL   Calcium 8.7 (L) 8.9 - 10.3 mg/dL   Total Protein 6.6 6.5 - 8.1 g/dL   Albumin 3.6 3.5 - 5.0 g/dL   AST 18 15 - 41 U/L   ALT 16 14 - 54 U/L   Alkaline Phosphatase 73 38 - 126 U/L   Total Bilirubin 1.1 0.3 - 1.2 mg/dL   GFR calc non Af Amer 53 (L) >60 mL/min   GFR calc Af Amer >60 >60 mL/min    Comment: (NOTE) The eGFR has been calculated using the CKD EPI equation. This calculation  has not been validated in all clinical situations. eGFR's persistently <60 mL/min signify possible Chronic Kidney Disease.    Anion gap 6 5 - 15  Troponin I     Status: None   Collection Time: 04/24/16  3:22 PM  Result Value Ref Range   Troponin I <0.03 <0.03 ng/mL  Protime-INR     Status: None   Collection Time: 04/24/16  3:22 PM  Result Value Ref Range   Prothrombin Time 14.3 11.4 - 15.2 seconds   INR 1.11   CBC     Status: Abnormal   Collection Time: 04/25/16  4:17 AM  Result Value Ref Range   WBC 6.8 4.0 - 10.5 K/uL   RBC 3.82 (L) 3.87 - 5.11 MIL/uL   Hemoglobin 12.1 12.0 - 15.0 g/dL   HCT 36.5 36.0 - 46.0 %   MCV 95.5 78.0 - 100.0 fL   MCH 31.7 26.0 - 34.0 pg   MCHC 33.2 30.0 - 36.0 g/dL   RDW 14.5 11.5 - 15.5 %   Platelets 153 150 - 400 K/uL    ABGS No results for input(s): PHART, PO2ART, TCO2, HCO3 in the last 72 hours.  Invalid input(s): PCO2 CULTURES Recent Results (from the past 240 hour(s))  Urine culture     Status: Abnormal  (Preliminary result)   Collection Time: 04/18/16 12:25 PM  Result Value Ref Range Status   Specimen Description URINE, CATHETERIZED  Final   Special Requests NONE  Final   Culture (A)  Final    >=100,000 COLONIES/mL ESCHERICHIA COLI CULTURE REINCUBATED FOR BETTER GROWTH Performed at [facility]    Report Status PENDING  Incomplete   Organism ID, Bacteria ESCHERICHIA COLI (A)  Final      Susceptibility   Escherichia coli - MIC*    AMPICILLIN 4 SENSITIVE Sensitive     CEFAZOLIN <=4 SENSITIVE Sensitive     CEFTRIAXONE <=1 SENSITIVE Sensitive     CIPROFLOXACIN <=0.25 SENSITIVE Sensitive     GENTAMICIN <=1 SENSITIVE Sensitive     IMIPENEM <=0.25 SENSITIVE Sensitive     NITROFURANTOIN 32 SENSITIVE Sensitive     TRIMETH/SULFA <=20 SENSITIVE Sensitive     AMPICILLIN/SULBACTAM <=2 SENSITIVE Sensitive     PIP/TAZO <=4 SENSITIVE Sensitive     Extended ESBL NEGATIVE Sensitive     * >=100,000 COLONIES/mL ESCHERICHIA COLI  MRSA PCR Screening     Status: None   Collection Time: 04/19/16  4:09 AM  Result Value Ref Range Status   MRSA by PCR NEGATIVE NEGATIVE Final    Comment:        The GeneXpert MRSA Assay (FDA approved for NASAL specimens only), is one component of a comprehensive MRSA colonization surveillance program. It is not intended to diagnose MRSA infection nor to guide or monitor treatment for MRSA infections.    Studies/Results: Dg Chest 2 View  Result Date: 04/24/2016 CLINICAL DATA:  82 year old female shortness of breath and confusion. Initial encounter. EXAM: CHEST  2 VIEW COMPARISON:  04/18/2016 and earlier. FINDINGS: Chronic right chest 2 lead cardiac pacemaker appear stable. There is cardiomegaly. Other mediastinal contours are within normal limits. Visualized tracheal air column is within normal limits. Large lung volumes. No pneumothorax, pulmonary edema, pleural effusion or acute pulmonary opacity. Stable mild linear scarring in the lingula. Osteopenia. No  acute osseous abnormality identified. Calcified aortic atherosclerosis. Stable cholecystectomy clips. IMPRESSION: Stable cardiomegaly. No acute cardiopulmonary abnormality. Calcified aortic atherosclerosis. Electronically Signed   By: Genevie Ann M.D.   On: 04/24/2016  16:18    Medications:  Prior to Admission:  Prescriptions Prior to Admission  Medication Sig Dispense Refill Last Dose  . allopurinol (ZYLOPRIM) 300 MG tablet Take 300 mg by mouth Daily.    unknown  . cefUROXime (CEFTIN) 250 MG tablet Take 1 tablet (250 mg total) by mouth 2 (two) times daily with a meal. 10 tablet 0 unknown  . diltiazem (CARDIZEM CD) 240 MG 24 hr capsule Take 240 mg by mouth daily.     unknown  . furosemide (LASIX) 40 MG tablet TAKE 1 TABLET (40 MG TOTAL) BY MOUTH DAILY. 30 tablet 11 unknown  . naproxen (NAPROSYN) 500 MG tablet Take 500 mg by mouth 2 (two) times daily with a meal.   unknown  . metoprolol succinate (TOPROL-XL) 100 MG 24 hr tablet TAKE 1 TABLET (100 MG TOTAL) BY MOUTH DAILY. (Patient not taking: Reported on 04/24/2016) 30 tablet 11 Not Taking at Unknown time   Scheduled: . cefUROXime  250 mg Oral BID WC  . diltiazem  240 mg Oral Daily  . enoxaparin (LOVENOX) injection  40 mg Subcutaneous Q24H  . metoprolol succinate  100 mg Oral Daily   Continuous:  HQR:[id number], diltiazem, ondansetron (ZOFRAN) IV  Assesment: She was admitted with another episode of atrial fibrillation with rapid ventricular response. It's not at all clear that she is taking her medications are that she even has her medications. Her situation is that she lives alone she is still driving her home is in disarray. She has dementia. I think this is a result of a brain aneurysm but I'm not clear about that. I'm concerned about her going home and whether she is safe. When I talked to her about that she seems genuinely puzzled that anyone could be concerned about her safety and that anyone but would be concerned about her home. Active  Problems:   ATRIAL FIBRILLATION, CHRONIC   UTI (urinary tract infection)   Noncompliance with medication regimen   Dementia with behavioral disturbance    Plan: Continue treatments. I'm going to ask for PT consultation. Social service consultation is underway. I will plan to go ahead and request neurology consultation and I'm going to go ahead and get baseline lab work which may help [place] with dementia. I will start her on medications here but I don't know if she will take them    LOS: 0 days   Lonza Shimabukuro L 04/25/2016, 8:26 AM

## 2016-04-25 NOTE — Progress Notes (Signed)
Pt discharged home today per Dr. Juanetta GoslingHawkins.  Pt's IV site D/C'd and WDL.  Pt's VSS.  Pt provided with home medication list, discharge instructions and prescriptions.  Verbalized understanding.  Pt left floor via WC in stable condition accompanied by NT.  Central cab called per patient request.  Brother-in-law called and made aware of patient's discharge home.  Verbalized understanding.

## 2016-04-25 NOTE — Care Management Obs Status (Signed)
MEDICARE OBSERVATION STATUS NOTIFICATION   Patient Details  Name: [name] MRN: [id number] Date of Birth: 1925/05/19   Medicare Observation Status Notification Given:  Yes    Malcolm MetroChildress, Aubryn Spinola Demske, RN 04/25/2016, 2:09 PM

## 2016-04-25 NOTE — Evaluation (Signed)
Physical Therapy Evaluation Patient Details Name: [name] MRN: [id number] DOB: Jun 29, 1925 Today's Date: 04/25/2016   History of Present Illness  82 y.o. female with medical history significant of afib no longer on anticoagulation, COPD, and likely dementia presents because she  "couldn't figure out what was going on".  She fell a couple of times and they had to pick her up at home.  No injuries.  No chest pain.  Hips are currently hurting from sitting on the bedpan.  She was previously admitted by Dr. Juanetta Gosling from 12/13-15 for UTI after falls.  At that time his note reports that she had stopped all of her medications at home.  She responded well and was discharged after treatment of UTI and with diltiazem resumption.  She was taken off anticoagulation and diltizaem during that hospitalization due to multiple falls and medical noncompliance.  They discussed placement but the patient refused.  Dx: Afib with RVR.      Clinical Impression  Pt received in bed, and was agreeable to PT evaluation.  Pt expressed that she is not exactly sure why she is here.  She expressed that she was getting ready to go to the [employer], and on her way in she started to feel funny, so they helped her back to the car and called EMS.  Pt has no recollection of her recent admission at the beginning of the month, and does not recall falling.  She normally ambulates with a quad cane, and she states she is independent with dressing and bathing.  Pt admits to not taking medications like she should.  She states that when her friends call and ask to meet her for a meal, that she just goes and forgets about her medication.  PT educated pt on importance of taking her medications appropriately.  During PT evaluation, she ambulated 251ft with quad cane and Min guard and pt expressed fatigue at the end.  HR remained 72-91bpm during mobility, and SpO2 99% on RA.  Pt is strongly recommended for ALF, however pt states  that she is not sure she wants to move out of her house.      Follow Up Recommendations Supervision/Assistance - 24 hour;Other (comment);Home health PT (ALF - however, pt does not like the idea of having to leave her house.  )    Equipment Recommendations  None recommended by PT    Recommendations for Other Services       Precautions / Restrictions Precautions Precautions: Fall Precaution Comments: Pt has had several falls.  Restrictions Weight Bearing Restrictions: No      Mobility  Bed Mobility Overal bed mobility: Modified Independent       Supine to sit: Modified independent (Device/Increase time)        Transfers Overall transfer level: Needs assistance   Transfers: Sit to/from Stand Sit to Stand: Min guard            Ambulation/Gait Ambulation/Gait assistance: Min guard Ambulation Distance (Feet): 210 Feet Assistive device: Quad cane Gait Pattern/deviations: Step-through pattern;Trunk flexed Gait velocity: 0.98ft/sec Gait velocity interpretation: <1.8 ft/sec, indicative of risk for recurrent falls General Gait Details: Pt expressed feeling SOB after ambulating ~132ft.  Pt took 1 standing rest break as a stretcher passed by in the hallway.  HR noted to range from 72-91bpm, and SpO2 upon return to her room was 99% on RA.    Stairs            Wheelchair Mobility    Modified  Rankin (Stroke Patients Only)       Balance Overall balance assessment: History of Falls;Needs assistance Sitting-balance support: Feet supported;Bilateral upper extremity supported Sitting balance-Leahy Scale: Good     Standing balance support: Single extremity supported Standing balance-Leahy Scale: Fair                               Pertinent Vitals/Pain Pain Assessment: No/denies pain    Home Living   Living Arrangements: Alone Available Help at Discharge: Available PRN/intermittently (brother in law lives 3-4 miles away - checks in every day  (sometimes by phone) and brings her mail in.  ) Type of Home: House Home Access: Stairs to enter   [employer] Corporation[employer]trance Stairs-Number of Steps: 1 entry step Home Layout: One level Home Equipment: Cane - single point;Walker - 2 wheels      Prior Function Level of Independence: Independent with assistive device(s)   Gait / Transfers Assistance Needed: Pt uses a quad cane for ambulation  ADL's / Homemaking Assistance Needed: independent with dressing and bathing, driving a car and a truck, and community ambulator to get groceries, eat out, and run errands.         Hand Dominance   Dominant Hand: Right    Extremity/Trunk Assessment   Upper Extremity Assessment Upper Extremity Assessment: Generalized weakness    Lower Extremity Assessment Lower Extremity Assessment: Generalized weakness       Communication   Communication: No difficulties (hyperverbal)  Cognition Arousal/Alertness: Awake/alert Behavior During Therapy: WFL for tasks assessed/performed Overall Cognitive Status: Impaired/Different from baseline Area of Impairment: Orientation Orientation Level: Disoriented to;Time (Pt does not recall previous admission. )                  General Comments      Exercises     Assessment/Plan    PT Assessment Patient needs continued PT services  PT Problem List Decreased activity tolerance;Decreased balance;Decreased mobility;Decreased cognition;Decreased knowledge of use of DME;Decreased safety awareness;Decreased knowledge of precautions;Cardiopulmonary status limiting activity          PT Treatment Interventions DME instruction;Gait training;Functional mobility training;Therapeutic activities;Therapeutic exercise;Balance training;Cognitive remediation;Patient/family education    PT Goals (Current goals can be found in the Care Plan section)  Acute Rehab PT Goals Patient Stated Goal: Pt continues to want to go back to her home PT Goal Formulation: With patient Time For  Goal Achievement: 05/02/16 Potential to Achieve Goals: Fair    Frequency Min 3X/week   Barriers to discharge Decreased caregiver support Pt lives alone, and this is her 2nd admission this month    Co-evaluation               End of Session Equipment Utilized During Treatment: Gait belt Activity Tolerance: Patient tolerated treatment well Patient left: in chair;with call bell/phone within reach;with chair alarm set Nurse Communication: Mobility status Shanda Bumps(Jessica, RN observed pt's mobilization during evaluation.  mobiltiy sheet left up in pt's room. )    Functional Assessment Tool Used: [employer] AM-PAC "6-clicks"  Functional Limitation: Mobility: Walking and moving around Mobility: Walking and Moving Around Current Status [phone](G8978): At least 20 percent but less than 40 percent impaired, limited or restricted Mobility: Walking and Moving Around Goal Status [phone](G8979): At least 1 percent but less than 20 percent impaired, limited or restricted    Time: [id number] PT Time Calculation (min) (ACUTE ONLY): 33 min   Charges:   PT Evaluation $PT Eval Low Complexity: 1  Procedure PT Treatments $Gait Training: 8-22 mins   PT G Codes:   PT G-Codes **NOT FOR INPATIENT CLASS** Functional Assessment Tool Used: [employer] AM-PAC "6-clicks"  Functional Limitation: Mobility: Walking and moving around Mobility: Walking and Moving Around Current Status [phone](G8978): At least 20 percent but less than 40 percent impaired, limited or restricted Mobility: Walking and Moving Around Goal Status [phone](G8979): At least 1 percent but less than 20 percent impaired, limited or restricted    Beth Hailey Miles, PT, DPT X: [phone]

## 2016-04-25 NOTE — Clinical Social Work Note (Signed)
Clinical Social Work Assessment  Patient Details  Name: [name] MRN: [id number] Date of Birth: [birth year]  Date of referral:  04/25/16               Reason for consult:  Discharge Planning                Permission sought to share information with:    Permission granted to share information::     Name::        Agency::     Relationship::     Contact Information:     Housing/Transportation Living arrangements for the past 2 months:  Single Family Home Source of Information:  Patient Patient Interpreter Needed:  None Criminal Activity/Legal Involvement Pertinent to Current Situation/Hospitalization:  No - Comment as needed Significant Relationships:  Other Family Members Lives with:  Self Do you feel safe going back to the place where you live?  Yes Need for family participation in patient care:  Yes (Comment)  Care giving concerns:  None identified by patient.   Social Worker assessment / plan: Patient was assessed a few days ago and declined placement. Patient stated that she feels safe in her home although her home needs to be cleaned. Patient stated that she believes that she is taking her medications correctly, however her PCP says she isn't. She admitted to an episode of losing her medications. CSW discussed concerns related to overall home safety and placement options. Patient stated "I would be very unhappy. I'd be miserable" related to going to placement.  Patient indicated that she enjoys painting [employer], candle wicking and cross stitch while at home and she does not desire to go to placement. CSW discussed that many of these activities could be done in placement and patient continued to decline placement as as discharge option.  CSW contacted [facility] regarding referral. Mardene Celeste at [facility] took referral and indicated that she was going to give it to her supervisor. She stated that it may not be a lot that they could do as patient has capacity and is able to  make her own decisions. Mardene Celeste stated that they would contact CSW as to whether the case was accepted/met state standards. CSW signing off.   Employment status:  Retired Nurse, adult PT Recommendations:  Not assessed at this time Information / Referral to community resources:     Patient/Family's Response to care:  Patient declines placement.   Patient/Family's Understanding of and Emotional Response to Diagnosis, Current Treatment, and Prognosis: Patient was able to discuss the situation that lead to current hospitalization in detail.   Emotional Assessment Appearance:  Appears stated age Attitude/Demeanor/Rapport:   (Cooperative, miserable) Affect (typically observed):  Accepting Orientation:  Oriented to Self, Oriented to Place, Oriented to  Time, Oriented to Situation Alcohol / Substance use:  Not Applicable Psych involvement (Current and /or in the community):  No (Comment)  Discharge Needs  Concerns to be addressed:  Discharge Planning Concerns Readmission within the last 30 days:  No Current discharge risk:  None Barriers to Discharge:  No Barriers Identified   Ihor Gully, LCSW 04/25/2016, 10:18 AM

## 2016-04-26 LAB — FOLATE RBC
Folate, Hemolysate: 238.1 ng/mL
Folate, RBC: 640 ng/mL (ref >498)
Hematocrit: 37.2 % (ref 34.0–46.6)

## 2016-05-21 ENCOUNTER — Ambulatory Visit (INDEPENDENT_AMBULATORY_CARE_PROVIDER_SITE_OTHER): Payer: Medicare Other | Admitting: *Deleted

## 2016-05-21 DIAGNOSIS — I482 Chronic atrial fibrillation: Secondary | ICD-10-CM | POA: Diagnosis not present

## 2016-05-21 DIAGNOSIS — I4821 Permanent atrial fibrillation: Secondary | ICD-10-CM

## 2016-05-21 LAB — CUP PACEART INCLINIC DEVICE CHECK
Battery Impedance: 523 Ohm
Battery Remaining Longevity: 88 mo
Battery Voltage: 2.77 V
Brady Statistic RV Percent Paced: 18 %
Date Time Interrogation Session: 20180115154648
Implantable Lead Implant Date: 20010509
Implantable Lead Location: 753860
Implantable Lead Model: 5076
Implantable Pulse Generator Implant Date: 20141120
Lead Channel Impedance Value: 0 Ohm
Lead Channel Impedance Value: 507 Ohm
Lead Channel Pacing Threshold Amplitude: 0.625 V
Lead Channel Pacing Threshold Amplitude: 1 V
Lead Channel Pacing Threshold Pulse Width: 0.4 ms
Lead Channel Pacing Threshold Pulse Width: 0.4 ms
Lead Channel Sensing Intrinsic Amplitude: 8 mV
Lead Channel Setting Pacing Amplitude: 2.5 V
Lead Channel Setting Pacing Pulse Width: 0.4 ms
Lead Channel Setting Sensing Sensitivity: 4 mV

## 2016-05-21 NOTE — Progress Notes (Signed)
Pacemaker check in clinic. Normal device function. Threshold, sensing, and impedance consistent with previous measurements. Device programmed to maximize longevity. 6865 high ventricular rates noted--max dur. 25sec--AF/RVR per EGMs.. Device programmed at appropriate safety margins. Histogram distribution appropriate for patient activity level. Device programmed to optimize intrinsic conduction. Estimated longevity 7.5 years. Patient will follow up with GT/R in 3 months.

## 2016-07-23 ENCOUNTER — Ambulatory Visit (INDEPENDENT_AMBULATORY_CARE_PROVIDER_SITE_OTHER): Payer: Medicare Other | Admitting: *Deleted

## 2016-07-23 DIAGNOSIS — Z5181 Encounter for therapeutic drug level monitoring: Secondary | ICD-10-CM

## 2016-09-27 ENCOUNTER — Other Ambulatory Visit (HOSPITAL_COMMUNITY): Payer: Self-pay | Admitting: Pulmonary Disease

## 2016-09-27 ENCOUNTER — Ambulatory Visit (HOSPITAL_COMMUNITY)
Admission: RE | Admit: 2016-09-27 | Discharge: 2016-09-27 | Disposition: A | Discharge status: Home / Self Care | Payer: Medicare Other | Source: Ambulatory Visit | Attending: Pulmonary Disease | Admitting: Pulmonary Disease

## 2016-09-27 DIAGNOSIS — M79605 Pain in left leg: Secondary | ICD-10-CM | POA: Diagnosis present

## 2016-09-27 DIAGNOSIS — M79604 Pain in right leg: Secondary | ICD-10-CM | POA: Insufficient documentation

## 2016-09-27 DIAGNOSIS — M79662 Pain in left lower leg: Secondary | ICD-10-CM

## 2016-09-27 DIAGNOSIS — I4891 Unspecified atrial fibrillation: Secondary | ICD-10-CM | POA: Diagnosis not present

## 2016-09-27 DIAGNOSIS — M79661 Pain in right lower leg: Secondary | ICD-10-CM

## 2016-09-27 DIAGNOSIS — I824Z2 Acute embolism and thrombosis of unspecified deep veins of left distal lower extremity: Secondary | ICD-10-CM | POA: Diagnosis not present

## 2016-09-27 DIAGNOSIS — R2243 Localized swelling, mass and lump, lower limb, bilateral: Secondary | ICD-10-CM

## 2016-09-27 DIAGNOSIS — R069 Unspecified abnormalities of breathing: Secondary | ICD-10-CM | POA: Diagnosis not present

## 2016-09-27 DIAGNOSIS — I25110 Atherosclerotic heart disease of native coronary artery with unstable angina pectoris: Secondary | ICD-10-CM | POA: Diagnosis not present

## 2016-09-28 ENCOUNTER — Other Ambulatory Visit (HOSPITAL_COMMUNITY)
Admission: RE | Admit: 2016-09-28 | Discharge: 2016-09-28 | Disposition: A | Discharge status: Home / Self Care | Payer: Medicare Other | Source: Ambulatory Visit | Attending: Pulmonary Disease | Admitting: Pulmonary Disease

## 2016-09-28 DIAGNOSIS — J449 Chronic obstructive pulmonary disease, unspecified: Secondary | ICD-10-CM | POA: Diagnosis present

## 2016-09-28 DIAGNOSIS — I10 Essential (primary) hypertension: Secondary | ICD-10-CM | POA: Insufficient documentation

## 2016-09-28 DIAGNOSIS — F419 Anxiety disorder, unspecified: Secondary | ICD-10-CM | POA: Diagnosis present

## 2016-09-28 DIAGNOSIS — I4891 Unspecified atrial fibrillation: Secondary | ICD-10-CM | POA: Diagnosis present

## 2016-09-28 DIAGNOSIS — M109 Gout, unspecified: Secondary | ICD-10-CM | POA: Insufficient documentation

## 2016-09-28 LAB — CBC
HCT: 39.2 % (ref 36.0–46.0)
Hemoglobin: 13.1 g/dL (ref 12.0–15.0)
MCH: 31.3 pg (ref 26.0–34.0)
MCHC: 33.4 g/dL (ref 30.0–36.0)
MCV: 93.8 fL (ref 78.0–100.0)
Platelets: 146 10*3/uL — ABNORMAL LOW (ref 150–400)
RBC: 4.18 MIL/uL (ref 3.87–5.11)
RDW: 14.3 % (ref 11.5–15.5)
WBC: 7.4 10*3/uL (ref 4.0–10.5)

## 2016-09-28 LAB — COMPREHENSIVE METABOLIC PANEL
ALT: 12 U/L — ABNORMAL LOW (ref 14–54)
AST: 19 U/L (ref 15–41)
Albumin: 3.8 g/dL (ref 3.5–5.0)
Alkaline Phosphatase: 74 U/L (ref 38–126)
Anion gap: 9 (ref 5–15)
BUN: 9 mg/dL (ref 6–20)
CO2: 27 mmol/L (ref 22–32)
Calcium: 8.8 mg/dL — ABNORMAL LOW (ref 8.9–10.3)
Chloride: 103 mmol/L (ref 101–111)
Creatinine, Ser: 0.69 mg/dL (ref 0.44–1.00)
GFR calc Af Amer: >60 mL/min (ref >60)
GFR calc non Af Amer: >60 mL/min (ref >60)
Glucose, Bld: 90 mg/dL (ref 65–99)
Potassium: 3.6 mmol/L (ref 3.5–5.1)
Sodium: 139 mmol/L (ref 135–145)
Total Bilirubin: 1.2 mg/dL (ref 0.3–1.2)
Total Protein: 6.8 g/dL (ref 6.5–8.1)

## 2016-09-28 LAB — TSH: TSH: 3.099 u[IU]/mL (ref 0.350–4.500)

## 2016-09-28 LAB — LIPID PANEL
Cholesterol: 173 mg/dL (ref 0–200)
HDL: 60 mg/dL (ref >40)
LDL Cholesterol: 101 mg/dL — ABNORMAL HIGH (ref 0–99)
Total CHOL/HDL Ratio: 2.9 RATIO
Triglycerides: 58 mg/dL (ref <150)
VLDL: 12 mg/dL (ref 0–40)

## 2016-10-04 ENCOUNTER — Encounter: Payer: Self-pay | Admitting: Internal Medicine

## 2016-10-04 ENCOUNTER — Ambulatory Visit (INDEPENDENT_AMBULATORY_CARE_PROVIDER_SITE_OTHER): Payer: Medicare Other | Admitting: Internal Medicine

## 2016-10-04 VITALS — BP 115/75 | HR 99 | Ht 68.0 in | Wt 166.4 lb

## 2016-10-04 DIAGNOSIS — I495 Sick sinus syndrome: Secondary | ICD-10-CM

## 2016-10-04 DIAGNOSIS — Z950 Presence of cardiac pacemaker: Secondary | ICD-10-CM

## 2016-10-04 DIAGNOSIS — R600 Localized edema: Secondary | ICD-10-CM | POA: Diagnosis not present

## 2016-10-04 DIAGNOSIS — I482 Chronic atrial fibrillation, unspecified: Secondary | ICD-10-CM

## 2016-10-04 LAB — CUP PACEART INCLINIC DEVICE CHECK
Battery Impedance: 649 Ohm
Battery Remaining Longevity: 82 mo
Battery Voltage: 2.78 V
Brady Statistic RV Percent Paced: 7 %
Date Time Interrogation Session: 20180531121026
Implantable Lead Implant Date: 20010509
Implantable Lead Location: 753860
Implantable Lead Model: 5076
Implantable Pulse Generator Implant Date: 20141120
Lead Channel Impedance Value: 0 Ohm
Lead Channel Impedance Value: 453 Ohm
Lead Channel Pacing Threshold Amplitude: 0.625 V
Lead Channel Pacing Threshold Amplitude: 0.75 V
Lead Channel Pacing Threshold Pulse Width: 0.4 ms
Lead Channel Pacing Threshold Pulse Width: 0.4 ms
Lead Channel Sensing Intrinsic Amplitude: 8 mV
Lead Channel Setting Pacing Amplitude: 2.5 V
Lead Channel Setting Pacing Pulse Width: 0.4 ms
Lead Channel Setting Sensing Sensitivity: 2.8 mV

## 2016-10-04 NOTE — Progress Notes (Signed)
HPI [name] returns today for followup. She is a very pleasant 83 year old woman with a history of symptomatic bradycardia, chronic atrial fibrillation, status post permanent pacemaker insertion secondary to bradycardia. In the interim, she has been in her garden and got an insect bite and her leg has been a bit swollen. No erythema.  Allergies  Allergen Reactions  . Bee Venom Swelling  . Codeine     REACTION: GI distress     Current Outpatient Prescriptions  Medication Sig Dispense Refill  . allopurinol (ZYLOPRIM) 300 MG tablet Take 300 mg by mouth Daily.     Marland Kitchen. apixaban (ELIQUIS) 5 MG TABS tablet Take 5 mg by mouth 2 (two) times daily.    . cefUROXime (CEFTIN) 250 MG tablet Take 1 tablet (250 mg total) by mouth 2 (two) times daily with a meal. 10 tablet 0  . diltiazem (CARDIZEM CD) 240 MG 24 hr capsule Take 240 mg by mouth daily.      Marland Kitchen. donepezil (ARICEPT) 5 MG tablet Take 1 tablet (5 mg total) by mouth at bedtime. 30 tablet 12  . doxycycline (VIBRAMYCIN) 100 MG capsule Take 100 mg by mouth 2 (two) times daily.    . furosemide (LASIX) 40 MG tablet TAKE 1 TABLET (40 MG TOTAL) BY MOUTH DAILY. 30 tablet 11  . metoprolol succinate (TOPROL-XL) 100 MG 24 hr tablet TAKE 1 TABLET (100 MG TOTAL) BY MOUTH DAILY. 30 tablet 11  . naproxen (NAPROSYN) 500 MG tablet Take 500 mg by mouth 2 (two) times daily with a meal.     No current facility-administered medications for this visit.      Past Medical History:  Diagnosis Date  . Arthritis   . Bilateral chronic knee pain   . Brain aneurysm   . COPD (chronic obstructive pulmonary disease) (HCC)   . DDD (degenerative disc disease), lumbar   . DJD (degenerative joint disease)   . DVT (deep venous thrombosis) (HCC)   . Gout   . Peripheral neuropathy   . Permanent atrial fibrillation [facility](HCC)    MDT [id number] pacemaker 03-26-2013 by Dr Ladona Ridgelaylor  . Pulmonary embolism (HCC)     ROS:   All systems reviewed and negative except as noted in the  HPI.   Past Surgical History:  Procedure Laterality Date  . CARDIAC CATHETERIZATION  2001  . Craniotomy clipping  06-2002   unruptured  . [facility]lin[facility]  . PACEMAKER INSERTION  09-13-1999; 03-26-2013   PPM gen change by Dr Ladona Ridgelaylor 03-26-2013 - MDT [id number]  . PERMANENT PACEMAKER GENERATOR CHANGE N/A 03/26/2013   Procedure: PERMANENT PACEMAKER GENERATOR CHANGE;  Surgeon: Marinus MawGregg W Taylor, MD;  [facility];  Service: Cardiovascular;  Laterality: N/A;     Family History  Problem Relation Age of Onset  . Heart attack Mother   . Heart attack Father   . Alzheimer's disease Sister      Social History   Social History  . Marital status: Widowed    Spouse name: N/A  . Number of children: N/A  . Years of education: N/A   Occupational History  . retired    Social History Main Topics  . Smoking status: Former Smoker    Packs/day: 0.25    Years: 35.00    Types: Cigarettes    Start date: 12/27/1977    Quit date: 05/26/2010  . Smokeless tobacco: Never Used  . Alcohol use No  . Drug use: No  .  Sexual activity: Not on file   Other Topics Concern  . Not on file   Social History Narrative  . No narrative on file     BP 115/75   Pulse 99   Ht 5\' 8"  (1.727 m)   Wt 166 lb 6.4 oz (75.5 kg)   BMI 25.30 kg/m   Physical Exam:  Well appearing elderly woman, NAD HEENT: Unremarkable Neck:  6 cm JVD, no thyromegally Lungs:  Clear with no wheezes, rales, or rhonchi. HEART:  IRegular rate rhythm, no murmurs, no rubs, no clicks Abd:  soft, positive bowel sounds, no organomegally, no rebound, no guarding Ext:  2 plus pulses, 2+ edema on right leg and trace edema on the left leg, no cyanosis, no clubbing Skin:  No rashes no nodules Neuro:  CN II through XII intact, motor grossly intact  DEVICE  Normal device function.  See PaceArt for details.   Assess/Plan: 1. Chronic atrial fib - her ventricular rate is well  controlled. No change in meds. 2. HTN - her blood pressure is well controlled. No change in meds. 3. PPM - her Medtronic VVI PM is working normally. Will recheck in several months. 4. Lower extremity edema - she had no evidence of infection. I have asked her to elevate her leg and reduce her salt intake. I do not think she could tolerate any support stockings.   Leonia Reeves.D.

## 2016-10-04 NOTE — Patient Instructions (Addendum)
Medication Instructions:   Your physician recommends that you continue on your current medications as directed. Please refer to the Current Medication list given to you today.  Labwork:  NONE  Testing/Procedures:  NONE  Follow-Up:  Your physician recommends that you schedule a follow-up appointment in: 6 months in the device clinic. You will receive a reminder letter in the mail in about 4 months reminding you to call and schedule your appointment. If you don't receive this letter, please contact our office.  Your physician recommends that you schedule a follow-up appointment in: 12 months with Dr. Ladona Ridgelaylor. You will receive a reminder letter in the mail in about 10 months reminding you to call and schedule your appointment. If you don't receive this letter, please contact our office.   Any Other Special Instructions Will Be Listed Below (If Applicable).  Please eat less salty foods.   If you need a refill on your cardiac medications before your next appointment, please call your pharmacy.

## 2016-10-09 ENCOUNTER — Ambulatory Visit (HOSPITAL_COMMUNITY)
Admission: RE | Admit: 2016-10-09 | Discharge: 2016-10-09 | Disposition: A | Discharge status: Home / Self Care | Payer: Medicare Other | Source: Ambulatory Visit | Attending: Pulmonary Disease | Admitting: Pulmonary Disease

## 2016-10-09 ENCOUNTER — Other Ambulatory Visit (HOSPITAL_COMMUNITY): Payer: Self-pay | Admitting: Pulmonary Disease

## 2016-10-09 DIAGNOSIS — R079 Chest pain, unspecified: Secondary | ICD-10-CM | POA: Insufficient documentation

## 2016-10-09 DIAGNOSIS — R0602 Shortness of breath: Secondary | ICD-10-CM | POA: Diagnosis present

## 2016-10-09 MED ORDER — IOPAMIDOL (ISOVUE-370) INJECTION 76%
100.0000 mL | Freq: Once | INTRAVENOUS | Status: AC | PRN
  Administered 2016-10-09: 100 mL via INTRAVENOUS

## 2016-12-05 ENCOUNTER — Emergency Department (HOSPITAL_COMMUNITY): Payer: Medicare Other

## 2016-12-05 ENCOUNTER — Encounter (HOSPITAL_COMMUNITY): Payer: Self-pay | Admitting: *Deleted

## 2016-12-05 ENCOUNTER — Observation Stay (HOSPITAL_COMMUNITY)
Admission: EM | Admit: 2016-12-05 | Discharge: 2016-12-06 | Disposition: A | Discharge status: Home / Self Care | Payer: Medicare Other | Attending: Pulmonary Disease | Admitting: Pulmonary Disease

## 2016-12-05 DIAGNOSIS — Z79899 Other long term (current) drug therapy: Secondary | ICD-10-CM | POA: Diagnosis not present

## 2016-12-05 DIAGNOSIS — Z7901 Long term (current) use of anticoagulants: Secondary | ICD-10-CM | POA: Diagnosis not present

## 2016-12-05 DIAGNOSIS — E876 Hypokalemia: Secondary | ICD-10-CM | POA: Diagnosis not present

## 2016-12-05 DIAGNOSIS — I4891 Unspecified atrial fibrillation: Secondary | ICD-10-CM | POA: Diagnosis present

## 2016-12-05 DIAGNOSIS — I10 Essential (primary) hypertension: Secondary | ICD-10-CM | POA: Diagnosis not present

## 2016-12-05 DIAGNOSIS — Z950 Presence of cardiac pacemaker: Secondary | ICD-10-CM | POA: Insufficient documentation

## 2016-12-05 DIAGNOSIS — J449 Chronic obstructive pulmonary disease, unspecified: Secondary | ICD-10-CM | POA: Insufficient documentation

## 2016-12-05 DIAGNOSIS — R079 Chest pain, unspecified: Secondary | ICD-10-CM | POA: Diagnosis present

## 2016-12-05 DIAGNOSIS — R0789 Other chest pain: Principal | ICD-10-CM | POA: Insufficient documentation

## 2016-12-05 DIAGNOSIS — F03918 Unspecified dementia, unspecified severity, with other behavioral disturbance: Secondary | ICD-10-CM | POA: Diagnosis present

## 2016-12-05 DIAGNOSIS — Z87891 Personal history of nicotine dependence: Secondary | ICD-10-CM | POA: Diagnosis not present

## 2016-12-05 DIAGNOSIS — F0391 Unspecified dementia with behavioral disturbance: Secondary | ICD-10-CM | POA: Diagnosis present

## 2016-12-05 LAB — URINALYSIS, ROUTINE W REFLEX MICROSCOPIC
Bilirubin Urine: NEGATIVE
Glucose, UA: NEGATIVE mg/dL
Hgb urine dipstick: NEGATIVE
Ketones, ur: NEGATIVE mg/dL
Nitrite: NEGATIVE
Protein, ur: NEGATIVE mg/dL
Specific Gravity, Urine: 1.005 (ref 1.005–1.030)
pH: 7 (ref 5.0–8.0)

## 2016-12-05 LAB — CBC WITH DIFFERENTIAL/PLATELET
Basophils Absolute: 0 10*3/uL (ref 0.0–0.1)
Basophils Relative: 0 %
Eosinophils Absolute: 0.1 10*3/uL (ref 0.0–0.7)
Eosinophils Relative: 1 %
HCT: 36.3 % (ref 36.0–46.0)
Hemoglobin: 12.6 g/dL (ref 12.0–15.0)
Lymphocytes Relative: 25 %
Lymphs Abs: 1.4 10*3/uL (ref 0.7–4.0)
MCH: 33.1 pg (ref 26.0–34.0)
MCHC: 34.7 g/dL (ref 30.0–36.0)
MCV: 95.3 fL (ref 78.0–100.0)
Monocytes Absolute: 0.4 10*3/uL (ref 0.1–1.0)
Monocytes Relative: 7 %
Neutro Abs: 3.6 10*3/uL (ref 1.7–7.7)
Neutrophils Relative %: 67 %
Platelets: 142 10*3/uL — ABNORMAL LOW (ref 150–400)
RBC: 3.81 MIL/uL — ABNORMAL LOW (ref 3.87–5.11)
RDW: 14.4 % (ref 11.5–15.5)
WBC: 5.4 10*3/uL (ref 4.0–10.5)

## 2016-12-05 LAB — BASIC METABOLIC PANEL
Anion gap: 11 (ref 5–15)
BUN: 12 mg/dL (ref 6–20)
CO2: 33 mmol/L — ABNORMAL HIGH (ref 22–32)
Calcium: 8.3 mg/dL — ABNORMAL LOW (ref 8.9–10.3)
Chloride: 94 mmol/L — ABNORMAL LOW (ref 101–111)
Creatinine, Ser: 1.23 mg/dL — ABNORMAL HIGH (ref 0.44–1.00)
GFR calc Af Amer: 44 mL/min — ABNORMAL LOW (ref >60)
GFR calc non Af Amer: 38 mL/min — ABNORMAL LOW (ref >60)
Glucose, Bld: 105 mg/dL — ABNORMAL HIGH (ref 65–99)
Potassium: 2.2 mmol/L — CL (ref 3.5–5.1)
Sodium: 138 mmol/L (ref 135–145)

## 2016-12-05 LAB — RAPID URINE DRUG SCREEN, HOSP PERFORMED
Amphetamines: NOT DETECTED
Barbiturates: NOT DETECTED
Benzodiazepines: NOT DETECTED
Cocaine: NOT DETECTED
Opiates: NOT DETECTED
Tetrahydrocannabinol: NOT DETECTED

## 2016-12-05 LAB — MAGNESIUM: Magnesium: 1.5 mg/dL — ABNORMAL LOW (ref 1.7–2.4)

## 2016-12-05 LAB — PROTIME-INR
INR: 2.23
Prothrombin Time: 25.1 seconds — ABNORMAL HIGH (ref 11.4–15.2)

## 2016-12-05 LAB — DIGOXIN LEVEL: Digoxin Level: 0.5 ng/mL — ABNORMAL LOW (ref 0.8–2.0)

## 2016-12-05 LAB — POTASSIUM: Potassium: 3.1 mmol/L — ABNORMAL LOW (ref 3.5–5.1)

## 2016-12-05 LAB — APTT: aPTT: 37 seconds — ABNORMAL HIGH (ref 24–36)

## 2016-12-05 LAB — TROPONIN I: Troponin I: <0.03 ng/mL (ref <0.03)

## 2016-12-05 MED ORDER — ONDANSETRON HCL 4 MG/2ML IJ SOLN: 4.0000 mg | Freq: Four times a day (QID) | INTRAMUSCULAR | Status: DC | PRN

## 2016-12-05 MED ORDER — MAGNESIUM SULFATE IN D5W 1-5 GM/100ML-% IV SOLN
1.0000 g | Freq: Once | INTRAVENOUS | Status: AC
  Administered 2016-12-05: 1 g via INTRAVENOUS
  Filled 2016-12-05: qty 100

## 2016-12-05 MED ORDER — SODIUM CHLORIDE 0.9% FLUSH
3.0000 mL | Freq: Two times a day (BID) | INTRAVENOUS | Status: DC
  Administered 2016-12-06: 3 mL via INTRAVENOUS

## 2016-12-05 MED ORDER — POTASSIUM CHLORIDE IN NACL 40-0.9 MEQ/L-% IV SOLN
INTRAVENOUS | Status: AC
  Administered 2016-12-05: 100 mL/h via INTRAVENOUS

## 2016-12-05 MED ORDER — POTASSIUM CHLORIDE CRYS ER 20 MEQ PO TBCR
40.0000 meq | EXTENDED_RELEASE_TABLET | Freq: Once | ORAL | Status: AC
  Administered 2016-12-05: 40 meq via ORAL
  Filled 2016-12-05: qty 2

## 2016-12-05 MED ORDER — DIGOXIN 125 MCG PO TABS
0.1250 mg | ORAL_TABLET | Freq: Every day | ORAL | Status: DC
Start: 2016-12-05 — End: 2016-12-06
  Administered 2016-12-05 – 2016-12-06 (×2): 0.125 mg via ORAL
  Filled 2016-12-05 (×2): qty 1

## 2016-12-05 MED ORDER — DOCUSATE SODIUM 100 MG PO CAPS
100.0000 mg | ORAL_CAPSULE | Freq: Two times a day (BID) | ORAL | Status: DC
  Administered 2016-12-05 – 2016-12-06 (×2): 100 mg via ORAL
  Filled 2016-12-05 (×2): qty 1

## 2016-12-05 MED ORDER — ALLOPURINOL 300 MG PO TABS
300.0000 mg | ORAL_TABLET | Freq: Every day | ORAL | Status: DC
  Administered 2016-12-05 – 2016-12-06 (×2): 300 mg via ORAL
  Filled 2016-12-05 (×2): qty 1

## 2016-12-05 MED ORDER — ONDANSETRON HCL 4 MG PO TABS: 4.0000 mg | ORAL_TABLET | Freq: Four times a day (QID) | ORAL | Status: DC | PRN

## 2016-12-05 MED ORDER — ACETAMINOPHEN 650 MG RE SUPP: 650.0000 mg | Freq: Four times a day (QID) | RECTAL | Status: DC | PRN

## 2016-12-05 MED ORDER — DONEPEZIL HCL 5 MG PO TABS
5.0000 mg | ORAL_TABLET | Freq: Every day | ORAL | Status: DC
  Administered 2016-12-05: 5 mg via ORAL
  Filled 2016-12-05: qty 1

## 2016-12-05 MED ORDER — ACETAMINOPHEN 325 MG PO TABS: 650.0000 mg | ORAL_TABLET | Freq: Four times a day (QID) | ORAL | Status: DC | PRN

## 2016-12-05 MED ORDER — POTASSIUM CHLORIDE 10 MEQ/100ML IV SOLN
10.0000 meq | Freq: Once | INTRAVENOUS | Status: AC
  Administered 2016-12-05: 10 meq via INTRAVENOUS
  Filled 2016-12-05: qty 100

## 2016-12-05 MED ORDER — METOPROLOL SUCCINATE ER 50 MG PO TB24
100.0000 mg | ORAL_TABLET | Freq: Every day | ORAL | Status: DC
  Administered 2016-12-06: 100 mg via ORAL
  Filled 2016-12-05: qty 2

## 2016-12-05 MED ORDER — DILTIAZEM HCL ER COATED BEADS 240 MG PO CP24
240.0000 mg | ORAL_CAPSULE | Freq: Every day | ORAL | Status: DC
  Administered 2016-12-06: 240 mg via ORAL
  Filled 2016-12-05: qty 1

## 2016-12-05 MED ORDER — APIXABAN 5 MG PO TABS
5.0000 mg | ORAL_TABLET | Freq: Two times a day (BID) | ORAL | Status: DC
  Administered 2016-12-05 – 2016-12-06 (×2): 5 mg via ORAL
  Filled 2016-12-05 (×2): qty 1

## 2016-12-05 NOTE — ED Provider Notes (Addendum)
[facility]-EMERGENCY DEPT Provider Note   CSN: [id number] Arrival date & time: 12/05/16  1047     History   Chief Complaint Chief Complaint  Patient presents with  . Chest Pain    HPI [name] is a 83 y.o. female.  HPI The patient presents to the emergency room for evaluation of chest discomfort and a concern that "everything was shutting down".  Patient states she was sitting in her recliner chair last night watching TV. Patient states everything went wrong.  When I asked her to explain what she means by that the patient states "I don't know what to say just everything was wrong.  I thought I might be having a heart attack." Patient states she did have some discomfort in her chest. She cannot characterize it. She can't tell me what else was wrong when she says everything. She does know that she has not felt that way before. She states she did not get up from her recliner chair but can't tell me whether it was because she felt too weak. Patient does not think she got out of her chair until this morning. She denies any vomiting or diarrhea. No headache. No numbness or weakness. She is not having any chest pain or shortness of breath right now. EMS noted the patient had episodes of bradycardia however shortly thereafter the pacemaker would activate and her heart rate was back into the 60s. Past Medical History:  Diagnosis Date  . Arthritis   . Bilateral chronic knee pain   . Brain aneurysm   . COPD (chronic obstructive pulmonary disease) (HCC)   . DDD (degenerative disc disease), lumbar   . DJD (degenerative joint disease)   . DVT (deep venous thrombosis) (HCC)   . Gout   . Peripheral neuropathy   . Permanent atrial fibrillation [facility](HCC)    MDT [id number] pacemaker 03-26-2013 by Dr Ladona Ridgelaylor  . Pulmonary embolism [facility](HCC)     Patient Active Problem List   Diagnosis Date Noted  . Dementia with behavioral disturbance 04/25/2016  . Noncompliance with medication regimen 04/24/2016  . UTI  (urinary tract infection) 04/18/2016  . Chest pain 02/10/2014  . Edema 02/10/2014  . Encounter for therapeutic drug monitoring 08/13/2013  . HYPOTENSION, ORTHOSTATIC 06/15/2010  . Essential hypertension, benign 04/06/2009  . Cardiac pacemaker in situ 04/06/2009  . PULMONARY EMBOLISM 01/18/2009  . ATRIAL FIBRILLATION, CHRONIC 01/18/2009  . BRADYCARDIA-TACHYCARDIA SYNDROME 01/18/2009  . DVT 01/18/2009    Past Surgical History:  Procedure Laterality Date  . CARDIAC CATHETERIZATION  2001  . Craniotomy clipping  06-2002   unruptured  . Left paraclinoid segment anurysum     [facility]  . PACEMAKER INSERTION  09-13-1999; 03-26-2013   PPM gen change by Dr Ladona Ridgelaylor 03-26-2013 - MDT [id number]  . PERMANENT PACEMAKER GENERATOR CHANGE N/A 03/26/2013   Procedure: PERMANENT PACEMAKER GENERATOR CHANGE;  Surgeon: Marinus MawGregg W Taylor, MD;  [facility];  Service: Cardiovascular;  Laterality: N/A;    OB History    No data available       Home Medications    Prior to Admission medications   Medication Sig Start Date End Date Taking? Authorizing Provider  allopurinol (ZYLOPRIM) 300 MG tablet Take 300 mg by mouth Daily.  04/07/11  Yes [provider]  apixaban (ELIQUIS) 5 MG TABS tablet Take 5 mg by mouth 2 (two) times daily.   Yes [provider]  digoxin (LANOXIN) 0.125 MG tablet Take 0.125 mg by mouth daily.  Yes [provider]  diltiazem (CARDIZEM CD) 240 MG 24 hr capsule Take 240 mg by mouth daily.     Yes [provider]  donepezil (ARICEPT) 5 MG tablet Take 1 tablet (5 mg total) by mouth at bedtime. 04/25/16  Yes Kari Baars, MD  doxycycline (VIBRAMYCIN) 100 MG capsule Take 100 mg by mouth 2 (two) times daily.   Yes [provider]  furosemide (LASIX) 40 MG tablet TAKE 1 TABLET (40 MG TOTAL) BY MOUTH DAILY. 06/20/15  Yes Dyann Kief, PA-C  metoprolol succinate (TOPROL-XL) 100 MG 24 hr tablet TAKE 1 TABLET (100 MG  TOTAL) BY MOUTH DAILY. 06/20/15  Yes Dyann Kief, PA-C    Family History Family History  Problem Relation Age of Onset  . Heart attack Mother   . Heart attack Father   . Alzheimer's disease Sister     Social History Social History  Substance Use Topics  . Smoking status: Former Smoker    Packs/day: 0.25    Years: 35.00    Types: Cigarettes    Start date: 12/27/1977    Quit date: 05/26/2010  . Smokeless tobacco: Never Used  . Alcohol use No     Allergies   Bee venom and Codeine   Review of Systems Review of Systems  All other systems reviewed and are negative.    Physical Exam Updated Vital Signs BP (!) 108/54   Pulse 61   Temp (!) 97.5 F (36.4 C) (Oral)   Resp 13   Ht 1.765 m (5' 9.5")   Wt 77.1 kg (170 lb)   SpO2 98%   BMI 24.74 kg/m   Physical Exam  Constitutional: She is oriented to person, place, and time. She appears well-developed and well-nourished. No distress.  HENT:  Head: Normocephalic and atraumatic.  Right Ear: External ear normal.  Left Ear: External ear normal.  Mouth/Throat: Oropharynx is clear and moist.  Eyes: Conjunctivae are normal. Right eye exhibits no discharge. Left eye exhibits no discharge. No scleral icterus.  Neck: Neck supple. No tracheal deviation present.  Cardiovascular: Normal rate, regular rhythm and intact distal pulses.   Pulmonary/Chest: Effort normal and breath sounds normal. No stridor. No respiratory distress. She has no wheezes. She has no rales.  Abdominal: Soft. Bowel sounds are normal. She exhibits no distension. There is no tenderness. There is no rebound and no guarding.  Musculoskeletal: She exhibits no edema or tenderness.  Some limited range of motion in her shoulders, patient states she has chronic arthritis issues   Neurological: She is alert and oriented to person, place, and time. She has normal strength. No cranial nerve deficit (no facial droop, extraocular movements intact, no slurred speech) or  sensory deficit. She exhibits normal muscle tone. She displays no seizure activity. Coordination normal.  No pronator drift bilateral upper extrem, able to hold both legs off bed for 5 seconds, sensation intact in all extremities, no visual field cuts, no left or right sided neglect, normal finger-nose exam bilaterally, no nystagmus noted   Skin: Skin is warm and dry. No rash noted.  Psychiatric: She has a normal mood and affect.  Nursing note and vitals reviewed.    ED Treatments / Results  Labs (all labs ordered are listed, but only abnormal results are displayed) Labs Reviewed  CBC WITH DIFFERENTIAL/PLATELET - Abnormal; Notable for the following:       Result Value   RBC 3.81 (*)    Platelets 142 (*)    All other  components within normal limits  BASIC METABOLIC PANEL - Abnormal; Notable for the following:    Potassium 2.2 (*)    Chloride 94 (*)    CO2 33 (*)    Glucose, Bld 105 (*)    Creatinine, Ser 1.23 (*)    Calcium 8.3 (*)    GFR calc non Af Amer 38 (*)    GFR calc Af Amer 44 (*)    All other components within normal limits  PROTIME-INR - Abnormal; Notable for the following:    Prothrombin Time 25.1 (*)    All other components within normal limits  APTT - Abnormal; Notable for the following:    aPTT 37 (*)    All other components within normal limits  URINALYSIS, ROUTINE W REFLEX MICROSCOPIC - Abnormal; Notable for the following:    Leukocytes, UA TRACE (*)    Bacteria, UA RARE (*)    Squamous Epithelial / LPF 0-5 (*)    All other components within normal limits  TROPONIN I  RAPID URINE DRUG SCREEN, HOSP PERFORMED  MAGNESIUM    EKG  EKG Interpretation  Date/Time:  Wednesday December 05 2016 11:00:34 EDT Ventricular Rate:  70 PR Interval:    QRS Duration: 115 QT Interval:  439 QTC Calculation: 516 R Axis:   85 Text Interpretation:  Afib/flut and V-paced complexes No further rhythm analysis attempted due to paced rhythm Nonspecific intraventricular conduction  delay Nonspecific repol abnormality, diffuse leads paced rhythm is new since last tracing Confirmed by Linwood DibblesKnapp, Caspian Deleonardis [phone]([id number]) on 12/05/2016 11:05:45 AM       Radiology Dg Chest Portable 1 View  Result Date: 12/05/2016 CLINICAL DATA:  Mid chest pain beginning last evening. History of COPD and atrial fibrillation. Former smoker. EXAM: PORTABLE CHEST 1 VIEW COMPARISON:  04/24/2016; 02/10/2013; chest CT - 10/09/2016 FINDINGS: Grossly unchanged enlarged cardiac silhouette and mediastinal contours. There is mild rightward deviation of the tracheal air at the level of the aortic arch, unchanged. Stable position of support apparatus. The lungs appear hyperexpanded with flattening of the diaphragms. Minimal linear heterogeneous opacities within the peripheral aspect of the left lower lung are unchanged and favored to represent atelectasis or scar. No focal airspace opacities. No pleural effusion or pneumothorax. No evidence of edema. No acute osseus abnormalities. IMPRESSION: Hyperexpanded lungs and chronic bronchitic change without superimposed acute cardiopulmonary disease. Electronically Signed   By: Simonne ComeJohn  Watts M.D.   On: 12/05/2016 11:28    Procedures .Critical Care Performed by: Linwood DibblesKNAPP, Ronnel Zuercher Authorized by: Linwood DibblesKNAPP, Kahlee Metivier   Critical care provider statement:    Critical care time (minutes):  30   Critical care was time spent personally by me on the following activities:  Discussions with consultants, evaluation of patient's response to treatment, examination of patient, ordering and performing treatments and interventions, ordering and review of laboratory studies, ordering and review of radiographic studies, pulse oximetry, re-evaluation of patient's condition, obtaining history from patient or surrogate and review of old charts   (including critical care time)  Medications Ordered in ED Medications  potassium chloride 10 mEq in 100 mL IVPB (not administered)  potassium chloride SA (K-DUR,KLOR-CON) CR tablet  40 mEq (not administered)     Initial Impression / Assessment and Plan / ED Course  I have reviewed the triage vital signs and the nursing notes.  Pertinent labs & imaging results that were available during my care of the patient were reviewed by me and considered in my medical decision making (see chart for details).  Clinical Course  as of Dec 06 1502  Wed Dec 05, 2016  1235 CT scanner is down.  Unable to get a CT scan of the head as I initially planned.  Normal neuro exam now.  Will continue to monitor.  [JK]    Clinical Course User Index [JK] Linwood Dibbles, MD    Pt presented to the ED with chest pain, and malaise.  Pt is not a great historian.  Tends to get sidetracked and was not able to specify what exactly was bothering her in addition to her chest.  Vitals are stable here.  Labs reassuring with the exception of severe hypokalemia.  Will start IV and oral.  Check a magnesium level.  It is possible she was having issues with dysrhythmias causing her symptoms.   Consult with medical service for admission.  Final Clinical Impressions(s) / ED Diagnoses   Final diagnoses:  Other chest pain  Hypomagnesemia  Hypokalemia        Linwood Dibbles, MD 12/05/16 1528

## 2016-12-05 NOTE — H&P (Signed)
History and Physical    Tracy CocoJacqueline H Cherne ZOX:[id number] DOB: 12/25/1925 DOA: 12/05/2016  PCP: Kari BaarsHawkins, Edward, MD Consultants:  Ladona Ridgelaylor - EP Patient coming from: Home - lives alone; NOK: brother-in-law, Iantha FallenKenneth, [phone], [phone]  Chief Complaint: "her heart wasn't doing right"  HPI: [name] is a 83 y.o. female with medical history significant of PE; afib on AC; COPD; pacemaker placement due to symptomatic bradycardia; and arthritis presenting because when she woke up today, she didn't sleep well, didn't get much sleep. When she woke up, it scared her because she felt like her heart wasn't doing right.  Her heart was acting up, just a funny feeling that she can't describe.  She has never had this feeling before.  Generalized body aching.  Mild SOB, some tightness in her chest.  Denies h/o abnormal potassium level.  She takes Lasix and she has been voiding a great deal, which is new.  Felt pretty good yesterday.  "I had some trouble one time here a while back and that was bowel movements."   ED Course: CT scanner down so no head CT.  Severe hypokalemia, K+ 2.2.  Given 40 mEq PO and 10 mEq IV KCl.  Mag also low and repleted.  Review of Systems: As per HPI; otherwise review of systems reviewed and negative.   Ambulatory Status:  Ambulates with a cane  Past Medical History:  Diagnosis Date  . Arthritis   . Bilateral chronic knee pain   . Brain aneurysm   . COPD (chronic obstructive pulmonary disease) (HCC)   . DDD (degenerative disc disease), lumbar   . DJD (degenerative joint disease)   . DVT (deep venous thrombosis) (HCC)   . Gout   . Peripheral neuropathy   . Permanent atrial fibrillation [facility](HCC)    MDT [id number] pacemaker 03-26-2013 by Dr Ladona Ridgelaylor  . Pulmonary embolism [facility](HCC)     Past Surgical History:  Procedure Laterality Date  . CARDIAC CATHETERIZATION  2001  . Craniotomy clipping  06-2002   unruptured  . Left paraclinoid segment anurysum     [facility]  . PACEMAKER INSERTION  09-13-1999; 03-26-2013   PPM gen change by Dr Ladona Ridgelaylor 03-26-2013 - MDT [id number]  . PERMANENT PACEMAKER GENERATOR CHANGE N/A 03/26/2013   Procedure: PERMANENT PACEMAKER GENERATOR CHANGE;  Surgeon: Marinus MawGregg W Taylor, MD;  [facility];  Service: Cardiovascular;  Laterality: N/A;    Social History   Social History  . Marital status: Widowed    Spouse name: N/A  . Number of children: N/A  . Years of education: N/A   Occupational History  . retired from the [employer]    Social History Main Topics  . Smoking status: Former Smoker    Packs/day: 0.25    Years: 35.00    Types: Cigarettes    Start date: 12/27/1977    Quit date: 05/26/2010  . Smokeless tobacco: Never Used  . Alcohol use No  . Drug use: No  . Sexual activity: Not on file   Other Topics Concern  . Not on file   Social History Narrative  . No narrative on file    Allergies  Allergen Reactions  . Bee Venom Swelling  . Codeine     REACTION: GI distress    Family History  Problem Relation Age of Onset  . Heart attack Mother   . Heart attack Father   . Alzheimer's disease Sister     Prior to Admission medications   Medication  Sig Start Date End Date Taking? Authorizing Provider  allopurinol (ZYLOPRIM) 300 MG tablet Take 300 mg by mouth Daily.  04/07/11  Yes [provider]  apixaban (ELIQUIS) 5 MG TABS tablet Take 5 mg by mouth 2 (two) times daily.   Yes [provider]  digoxin (LANOXIN) 0.125 MG tablet Take 0.125 mg by mouth daily.   Yes [provider]  diltiazem (CARDIZEM CD) 240 MG 24 hr capsule Take 240 mg by mouth daily.     Yes [provider]  donepezil (ARICEPT) 5 MG tablet Take 1 tablet (5 mg total) by mouth at bedtime. 04/25/16  Yes Kari Baars, MD  doxycycline (VIBRAMYCIN) 100 MG capsule Take 100 mg by mouth 2 (two) times daily.   Yes [provider]  furosemide (LASIX) 40 MG tablet TAKE 1 TABLET  (40 MG TOTAL) BY MOUTH DAILY. 06/20/15  Yes Dyann Kief, PA-C  metoprolol succinate (TOPROL-XL) 100 MG 24 hr tablet TAKE 1 TABLET (100 MG TOTAL) BY MOUTH DAILY. 06/20/15  Yes Dyann Kief, PA-C    Physical Exam: Vitals:   12/05/16 1844 12/05/16 2031 12/05/16 2109 12/05/16 2135  BP: 110/62  96/75   Pulse: 61  60 62  Resp: 18  18   Temp: 97.8 F (36.6 C)  97.9 F (36.6 C)   TempSrc: Oral  Oral   SpO2: 100% 93% 99%   Weight: 77.1 kg (170 lb)     Height: 5\' 9"  (1.753 m)        General:  Appears calm and comfortable and is NAD; she is incredibly conversant Eyes:  PERRL, EOMI, normal lids, iris ENT:  grossly normal hearing, lips & tongue, mmm Neck:  no LAD, masses or thyromegaly Cardiovascular:  RRR, no m/r/g. No LE edema.  Respiratory:  CTA bilaterally, no w/r/r. Normal respiratory effort. Abdomen:  soft, ntnd, NABS Skin:  no rash or induration seen on limited exam Musculoskeletal:  grossly normal tone BUE/BLE, good ROM, no bony abnormality Psychiatric:  grossly normal mood and affect, speech fluent and appropriate, AOx3.  Very talkative, quite tangential. Neurologic:  CN 2-12 grossly intact, moves all extremities in coordinated fashion, sensation intact  Labs on Admission: I have personally reviewed following labs and imaging studies  CBC:  Recent Labs Lab 12/05/16 1154  WBC 5.4  NEUTROABS 3.6  HGB 12.6  HCT 36.3  MCV 95.3  PLT 142*   Basic Metabolic Panel:  Recent Labs Lab 12/05/16 1154 12/05/16 1949  NA 138  --   K 2.2* 3.1*  CL 94*  --   CO2 33*  --   GLUCOSE 105*  --   BUN 12  --   CREATININE 1.23*  --   CALCIUM 8.3*  --   MG 1.5*  --    GFR: Estimated Creatinine Clearance: 33 mL/min (A) (by C-G formula based on SCr of 1.23 mg/dL (H)). Liver Function Tests: No results for input(s): AST, ALT, ALKPHOS, BILITOT, PROT, ALBUMIN in the last 168 hours. No results for input(s): LIPASE, AMYLASE in the last 168 hours. No results for input(s): AMMONIA in  the last 168 hours. Coagulation Profile:  Recent Labs Lab 12/05/16 1158  INR 2.23   Cardiac Enzymes:  Recent Labs Lab 12/05/16 1154  TROPONINI <0.03   BNP (last 3 results) No results for input(s): PROBNP in the last 8760 hours. HbA1C: No results for input(s): HGBA1C in the last 72 hours. CBG: No results for input(s): GLUCAP in the last 168 hours. Lipid Profile: No  results for input(s): CHOL, HDL, LDLCALC, TRIG, CHOLHDL, LDLDIRECT in the last 72 hours. Thyroid Function Tests: No results for input(s): TSH, T4TOTAL, FREET4, T3FREE, THYROIDAB in the last 72 hours. Anemia Panel: No results for input(s): VITAMINB12, FOLATE, FERRITIN, TIBC, IRON, RETICCTPCT in the last 72 hours. Urine analysis:    Component Value Date/Time   COLORURINE YELLOW 12/05/2016 1133   APPEARANCEUR CLEAR 12/05/2016 1133   LABSPEC 1.005 12/05/2016 1133   PHURINE 7.0 12/05/2016 1133   GLUCOSEU NEGATIVE 12/05/2016 1133   HGBUR NEGATIVE 12/05/2016 1133   BILIRUBINUR NEGATIVE 12/05/2016 1133   KETONESUR NEGATIVE 12/05/2016 1133   PROTEINUR NEGATIVE 12/05/2016 1133   UROBILINOGEN >8.0 (H) 02/10/2013 1845   NITRITE NEGATIVE 12/05/2016 1133   LEUKOCYTESUR TRACE (A) 12/05/2016 1133    Creatinine Clearance: Estimated Creatinine Clearance: 33 mL/min (A) (by C-G formula based on SCr of 1.23 mg/dL (H)).  Sepsis Labs: @LABRCNTIP (procalcitonin:4,lacticidven:4) )No results found for this or any previous visit (from the past 240 hour(s)).   Radiological Exams on Admission: Dg Chest Portable 1 View  Result Date: 12/05/2016 CLINICAL DATA:  Mid chest pain beginning last evening. History of COPD and atrial fibrillation. Former smoker. EXAM: PORTABLE CHEST 1 VIEW COMPARISON:  04/24/2016; 02/10/2013; chest CT - 10/09/2016 FINDINGS: Grossly unchanged enlarged cardiac silhouette and mediastinal contours. There is mild rightward deviation of the tracheal air at the level of the aortic arch, unchanged. Stable position of  support apparatus. The lungs appear hyperexpanded with flattening of the diaphragms. Minimal linear heterogeneous opacities within the peripheral aspect of the left lower lung are unchanged and favored to represent atelectasis or scar. No focal airspace opacities. No pleural effusion or pneumothorax. No evidence of edema. No acute osseus abnormalities. IMPRESSION: Hyperexpanded lungs and chronic bronchitic change without superimposed acute cardiopulmonary disease. Electronically Signed   By: Simonne ComeJohn  Watts M.D.   On: 12/05/2016 11:28    EKG: Independently reviewed.  Afib with paced rhythm of 70; nonspecific intraventricular conduction delay with no evidence of acute ischemia  Assessment/Plan Principal Problem:   Hypokalemia Active Problems:   Essential hypertension, benign   ATRIAL FIBRILLATION, CHRONIC   Chronic anticoagulation   Dementia with behavioral disturbance   Severe hypokalemia -Patient presenting with severe hypokalemia, K+ 2.2 -K+ 2.2 at 1154, repeat 3.1 at 1949; previously 3.6 on 5/25 -She was given 50 mEq KCl in the ER -An additional 40 mEq KCl were given in 1 L IVF over 10 hours and then the infusion will be stopped -Her K+ is up to 3.1 already so will not give additional KCl at this time -Recheck in the AM -Uncertain etiology, but Lasix has been added to her medication regimen.  Suspect that she is overdiuresed (BUN 12/Creatinine 1.23/GFR 38; 9/0.69/>60 on 5/25) and that this patient should not be taking Lasix. -Challengingly, it is not entirely clear that she knows what medications she takes and so a family member likely needs to bring her medications in for review in order to ensure that the Lasix is discontinued. -Her mag was also low (Magnesium 1.5) and has been repleted -UA reassuring, UDS negative  Afib, on anticoagulation -INR 2.23 -She is now rate-controlled with pacemaker -Given her dementia and that she is living alone, discontinuation of AC may need to be  considered at some time in the near future -Digoxin 0.5 - she is apparently not taking this medication; it has been resumed for now but also may need to be discontinued in the future.  HTN -Continue Cardizem and Toprol. -BP is  on the low side; since she has the pacemaker the Cardizem could probably be decreased or maybe even discontinued.  Dementia -As alluded to above, she is demonstrating signs of dementia - she is incredibly talkative and yet extremely tangential. -She is unable to provide a meaningful history. -Additionally, she is very unsure about what medications she takes and how and it sounds as though without her pill box she would be totally lost. -She also lives alone and does not appear to have much supervision. -Consider home health support.  Will place CM consult.  DVT prophylaxis: Eliquis Code Status:  DNR - confirmed with patient Family Communication: None present Disposition Plan:  Home once clinically improved Consults called: CM  Admission status: It is my clinical opinion that referral for OBSERVATION is reasonable and necessary in this patient based on the above information provided. The aforementioned taken together are felt to place the patient at high risk for further clinical deterioration. However it is anticipated that the patient may be medically stable for discharge from the hospital within 24 to 48 hours.     Jonah Blue MD [facility]  If 7PM-7AM, please contact night-coverage [email or web address] Password [email or web address]  12/05/2016, 10:01 PM

## 2016-12-05 NOTE — Progress Notes (Signed)
Pt unsure about which of her daily medications were taken at home this morning. Pt believes she took her metoprolol and cardizem and does not want those tonight, still unsure about digoxin. Contacted MD about giving digoxin tonight, orders to go ahead and give dose.

## 2016-12-05 NOTE — ED Notes (Signed)
CRITICAL VALUE ALERT  Critical Value:  K 2.2  Date & Time Notied:  12/05/16 1248  Provider Notified: Dr. Roselyn BeringJ. Knapp  Orders Received/Actions taken: na

## 2016-12-05 NOTE — ED Triage Notes (Signed)
Pt brought in by [facility] with c/o mid chest pain that started last night. Denies n/v/d, dizziness, SOB. EMS reports pt's HR would drop into the 40s during EMS route and then her pacemaker would kick in and bring HR back up into the 60s. Pt reports the pain is now gone.

## 2016-12-06 LAB — MRSA PCR SCREENING: MRSA by PCR: NEGATIVE

## 2016-12-06 LAB — BASIC METABOLIC PANEL
Anion gap: 9 (ref 5–15)
Anion gap: 8 (ref 5–15)
BUN: 13 mg/dL (ref 6–20)
BUN: 15 mg/dL (ref 6–20)
CO2: 29 mmol/L (ref 22–32)
CO2: 27 mmol/L (ref 22–32)
Calcium: 8.3 mg/dL — ABNORMAL LOW (ref 8.9–10.3)
Calcium: 8.3 mg/dL — ABNORMAL LOW (ref 8.9–10.3)
Chloride: 102 mmol/L (ref 101–111)
Chloride: 104 mmol/L (ref 101–111)
Creatinine, Ser: 1.05 mg/dL — ABNORMAL HIGH (ref 0.44–1.00)
Creatinine, Ser: 1.02 mg/dL — ABNORMAL HIGH (ref 0.44–1.00)
GFR calc Af Amer: 53 mL/min — ABNORMAL LOW (ref >60)
GFR calc Af Amer: 55 mL/min — ABNORMAL LOW (ref >60)
GFR calc non Af Amer: 46 mL/min — ABNORMAL LOW (ref >60)
GFR calc non Af Amer: 48 mL/min — ABNORMAL LOW (ref >60)
Glucose, Bld: 107 mg/dL — ABNORMAL HIGH (ref 65–99)
Glucose, Bld: 119 mg/dL — ABNORMAL HIGH (ref 65–99)
Potassium: 3.1 mmol/L — ABNORMAL LOW (ref 3.5–5.1)
Potassium: 3.5 mmol/L (ref 3.5–5.1)
Sodium: 140 mmol/L (ref 135–145)
Sodium: 139 mmol/L (ref 135–145)

## 2016-12-06 LAB — CBC
HCT: 37.5 % (ref 36.0–46.0)
Hemoglobin: 12.8 g/dL (ref 12.0–15.0)
MCH: 33 pg (ref 26.0–34.0)
MCHC: 34.1 g/dL (ref 30.0–36.0)
MCV: 96.6 fL (ref 78.0–100.0)
Platelets: 152 10*3/uL (ref 150–400)
RBC: 3.88 MIL/uL (ref 3.87–5.11)
RDW: 14.5 % (ref 11.5–15.5)
WBC: 6.4 10*3/uL (ref 4.0–10.5)

## 2016-12-06 MED ORDER — POTASSIUM CHLORIDE CRYS ER 20 MEQ PO TBCR
40.0000 meq | EXTENDED_RELEASE_TABLET | Freq: Once | ORAL | Status: AC
  Administered 2016-12-06: 40 meq via ORAL
  Filled 2016-12-06: qty 2

## 2016-12-06 NOTE — Progress Notes (Addendum)
Subjective: She says she feels okay. She is confused again. Her potassium is still 3.1. She is apparently been taking Lasix with no potassium.  Objective: Vital signs in last 24 hours: Temp:  [97.5 F (36.4 C)-97.9 F (36.6 C)] 97.7 F (36.5 C) (08/02 0408) Pulse Rate:  [58-63] 59 (08/02 0408) Resp:  [10-25] 18 (08/02 0408) BP: (96-114)/(48-75) 106/48 (08/02 0408) SpO2:  [93 %-100 %] 96 % (08/02 0408) Weight:  [77.1 kg (170 lb)] 77.1 kg (170 lb) (08/01 1844) Weight change:  Last BM Date: 12/05/16  Intake/Output from previous day: 08/01 0701 - 08/02 0700 In: 1245 [I.V.:1045; IV Piggyback:200] Out: -   PHYSICAL EXAM General appearance: alert, no distress and Mildly confused Resp: clear to auscultation bilaterally Cardio: irregularly irregular rhythm GI: soft, non-tender; bowel sounds normal; no masses,  no organomegaly Extremities: extremities normal, atraumatic, no cyanosis or edema Skin warm and dry  Lab Results:  Results for orders placed or performed during the hospital encounter of 12/05/16 (from the past 48 hour(s))  Urine rapid drug screen (hosp performed)not at [facility]     Status: None   Collection Time: 12/05/16 11:33 AM  Result Value Ref Range   Opiates NONE DETECTED NONE DETECTED   Cocaine NONE DETECTED NONE DETECTED   Benzodiazepines NONE DETECTED NONE DETECTED   Amphetamines NONE DETECTED NONE DETECTED   Tetrahydrocannabinol NONE DETECTED NONE DETECTED   Barbiturates NONE DETECTED NONE DETECTED    Comment:        DRUG SCREEN FOR MEDICAL PURPOSES ONLY.  IF CONFIRMATION IS NEEDED FOR ANY PURPOSE, NOTIFY LAB WITHIN 5 DAYS.        LOWEST DETECTABLE LIMITS FOR URINE DRUG SCREEN Drug Class       Cutoff (ng/mL) Amphetamine      1000 Barbiturate      200 Benzodiazepine   027 Tricyclics       253 Opiates          300 Cocaine          300 THC              50   Urinalysis, Routine w reflex microscopic (not at [facility])     Status: Abnormal   Collection Time:  12/05/16 11:33 AM  Result Value Ref Range   Color, Urine YELLOW YELLOW   APPearance CLEAR CLEAR   Specific Gravity, Urine 1.005 1.005 - 1.030   pH 7.0 5.0 - 8.0   Glucose, UA NEGATIVE NEGATIVE mg/dL   Hgb urine dipstick NEGATIVE NEGATIVE   Bilirubin Urine NEGATIVE NEGATIVE   Ketones, ur NEGATIVE NEGATIVE mg/dL   Protein, ur NEGATIVE NEGATIVE mg/dL   Nitrite NEGATIVE NEGATIVE   Leukocytes, UA TRACE (A) NEGATIVE   RBC / HPF 0-5 0 - 5 RBC/hpf   WBC, UA 6-30 0 - 5 WBC/hpf   Bacteria, UA RARE (A) NONE SEEN   Squamous Epithelial / LPF 0-5 (A) NONE SEEN   Mucous PRESENT    Hyaline Casts, UA PRESENT   Troponin I     Status: None   Collection Time: 12/05/16 11:54 AM  Result Value Ref Range   Troponin I <0.03 <0.03 ng/mL  CBC with Differential     Status: Abnormal   Collection Time: 12/05/16 11:54 AM  Result Value Ref Range   WBC 5.4 4.0 - 10.5 K/uL   RBC 3.81 (L) 3.87 - 5.11 MIL/uL   Hemoglobin 12.6 12.0 - 15.0 g/dL   HCT 36.3 36.0 - 46.0 %   MCV 95.3 78.0 -  100.0 fL   MCH 33.1 26.0 - 34.0 pg   MCHC 34.7 30.0 - 36.0 g/dL   RDW 14.4 11.5 - 15.5 %   Platelets 142 (L) 150 - 400 K/uL   Neutrophils Relative % 67 %   Neutro Abs 3.6 1.7 - 7.7 K/uL   Lymphocytes Relative 25 %   Lymphs Abs 1.4 0.7 - 4.0 K/uL   Monocytes Relative 7 %   Monocytes Absolute 0.4 0.1 - 1.0 K/uL   Eosinophils Relative 1 %   Eosinophils Absolute 0.1 0.0 - 0.7 K/uL   Basophils Relative 0 %   Basophils Absolute 0.0 0.0 - 0.1 K/uL  Basic metabolic panel     Status: Abnormal   Collection Time: 12/05/16 11:54 AM  Result Value Ref Range   Sodium 138 135 - 145 mmol/L   Potassium 2.2 (LL) 3.5 - 5.1 mmol/L    Comment: CRITICAL RESULT CALLED TO, READ BACK BY AND VERIFIED WITH: DANILS,B AT 1245 ON 8.1.2018 BY ISLEY,B    Chloride 94 (L) 101 - 111 mmol/L   CO2 33 (H) 22 - 32 mmol/L   Glucose, Bld 105 (H) 65 - 99 mg/dL   BUN 12 6 - 20 mg/dL   Creatinine, Ser 1.23 (H) 0.44 - 1.00 mg/dL   Calcium 8.3 (L) 8.9 - 10.3  mg/dL   GFR calc non Af Amer 38 (L) >60 mL/min   GFR calc Af Amer 44 (L) >60 mL/min    Comment: (NOTE) The eGFR has been calculated using the CKD EPI equation. This calculation has not been validated in all clinical situations. eGFR's persistently <60 mL/min signify possible Chronic Kidney Disease.    Anion gap 11 5 - 15  Magnesium     Status: Abnormal   Collection Time: 12/05/16 11:54 AM  Result Value Ref Range   Magnesium 1.5 (L) 1.7 - 2.4 mg/dL  Protime-INR     Status: Abnormal   Collection Time: 12/05/16 11:58 AM  Result Value Ref Range   Prothrombin Time 25.1 (H) 11.4 - 15.2 seconds   INR 2.23   APTT     Status: Abnormal   Collection Time: 12/05/16 11:58 AM  Result Value Ref Range   aPTT 37 (H) 24 - 36 seconds    Comment:        IF BASELINE aPTT IS ELEVATED, SUGGEST PATIENT RISK ASSESSMENT BE USED TO DETERMINE APPROPRIATE ANTICOAGULANT THERAPY.   Digoxin level     Status: Abnormal   Collection Time: 12/05/16  7:49 PM  Result Value Ref Range   Digoxin Level 0.5 (L) 0.8 - 2.0 ng/mL  Potassium     Status: Abnormal   Collection Time: 12/05/16  7:49 PM  Result Value Ref Range   Potassium 3.1 (L) 3.5 - 5.1 mmol/L    Comment: DELTA CHECK NOTED  MRSA PCR Screening     Status: None   Collection Time: 12/05/16 11:49 PM  Result Value Ref Range   MRSA by PCR NEGATIVE NEGATIVE    Comment:        The GeneXpert MRSA Assay (FDA approved for NASAL specimens only), is one component of a comprehensive MRSA colonization surveillance program. It is not intended to diagnose MRSA infection nor to guide or monitor treatment for MRSA infections.   Basic metabolic panel     Status: Abnormal   Collection Time: 12/06/16  4:48 AM  Result Value Ref Range   Sodium 140 135 - 145 mmol/L   Potassium 3.1 (L) 3.5 - 5.1 mmol/L  Chloride 102 101 - 111 mmol/L   CO2 29 22 - 32 mmol/L   Glucose, Bld 107 (H) 65 - 99 mg/dL   BUN 13 6 - 20 mg/dL   Creatinine, Ser 1.05 (H) 0.44 - 1.00 mg/dL    Calcium 8.3 (L) 8.9 - 10.3 mg/dL   GFR calc non Af Amer 46 (L) >60 mL/min   GFR calc Af Amer 53 (L) >60 mL/min    Comment: (NOTE) The eGFR has been calculated using the CKD EPI equation. This calculation has not been validated in all clinical situations. eGFR's persistently <60 mL/min signify possible Chronic Kidney Disease.    Anion gap 9 5 - 15  CBC     Status: None   Collection Time: 12/06/16  4:48 AM  Result Value Ref Range   WBC 6.4 4.0 - 10.5 K/uL   RBC 3.88 3.87 - 5.11 MIL/uL   Hemoglobin 12.8 12.0 - 15.0 g/dL   HCT 37.5 36.0 - 46.0 %   MCV 96.6 78.0 - 100.0 fL   MCH 33.0 26.0 - 34.0 pg   MCHC 34.1 30.0 - 36.0 g/dL   RDW 14.5 11.5 - 15.5 %   Platelets 152 150 - 400 K/uL    ABGS No results for input(s): PHART, PO2ART, TCO2, HCO3 in the last 72 hours.  Invalid input(s): PCO2 CULTURES Recent Results (from the past 240 hour(s))  MRSA PCR Screening     Status: None   Collection Time: 12/05/16 11:49 PM  Result Value Ref Range Status   MRSA by PCR NEGATIVE NEGATIVE Final    Comment:        The GeneXpert MRSA Assay (FDA approved for NASAL specimens only), is one component of a comprehensive MRSA colonization surveillance program. It is not intended to diagnose MRSA infection nor to guide or monitor treatment for MRSA infections.    Studies/Results: Dg Chest Portable 1 View  Result Date: 12/05/2016 CLINICAL DATA:  Mid chest pain beginning last evening. History of COPD and atrial fibrillation. Former smoker. EXAM: PORTABLE CHEST 1 VIEW COMPARISON:  04/24/2016; 02/10/2013; chest CT - 10/09/2016 FINDINGS: Grossly unchanged enlarged cardiac silhouette and mediastinal contours. There is mild rightward deviation of the tracheal air at the level of the aortic arch, unchanged. Stable position of support apparatus. The lungs appear hyperexpanded with flattening of the diaphragms. Minimal linear heterogeneous opacities within the peripheral aspect of the left lower lung are  unchanged and favored to represent atelectasis or scar. No focal airspace opacities. No pleural effusion or pneumothorax. No evidence of edema. No acute osseus abnormalities. IMPRESSION: Hyperexpanded lungs and chronic bronchitic change without superimposed acute cardiopulmonary disease. Electronically Signed   By: Sandi Mariscal M.D.   On: 12/05/2016 11:28    Medications:  Prior to Admission:  Prescriptions Prior to Admission  Medication Sig Dispense Refill Last Dose  . allopurinol (ZYLOPRIM) 300 MG tablet Take 300 mg by mouth Daily.    unknown  . apixaban (ELIQUIS) 5 MG TABS tablet Take 5 mg by mouth 2 (two) times daily.   unknown  . digoxin (LANOXIN) 0.125 MG tablet Take 0.125 mg by mouth daily.   unknown  . diltiazem (CARDIZEM CD) 240 MG 24 hr capsule Take 240 mg by mouth daily.     unknown  . donepezil (ARICEPT) 5 MG tablet Take 1 tablet (5 mg total) by mouth at bedtime. 30 tablet 12 unknown  . doxycycline (VIBRAMYCIN) 100 MG capsule Take 100 mg by mouth 2 (two) times daily.   unknown  .  metoprolol succinate (TOPROL-XL) 100 MG 24 hr tablet TAKE 1 TABLET (100 MG TOTAL) BY MOUTH DAILY. 30 tablet 11 unknown   Scheduled: . allopurinol  300 mg Oral Daily  . apixaban  5 mg Oral BID  . digoxin  0.125 mg Oral Daily  . diltiazem  240 mg Oral Daily  . docusate sodium  100 mg Oral BID  . donepezil  5 mg Oral QHS  . metoprolol succinate  100 mg Oral Daily  . potassium chloride  40 mEq Oral Once  . sodium chloride flush  3 mL Intravenous Q12H   Continuous:  KUN:[id number] **OR** acetaminophen, ondansetron **OR** ondansetron (ZOFRAN) IV  Assesment: She has hypokalemia. She has chronic atrial fib. She had a recent DVT and is now anticoagulated again because of that. She has dementia. Her potassium level is better.She has Lasix listed on her prior to admission medications but when I go to do medication reconciliation Lasix is not on her list. She's not sure she's taking it or not. I will call her  drugstore before discharge and see if she has been getting that or not. If she has been getting Lasix she will need potassium replacement to go with it. Principal Problem:   Hypokalemia Active Problems:   Essential hypertension, benign   ATRIAL FIBRILLATION, CHRONIC   Chronic anticoagulation   Dementia with behavioral disturbance    Plan: If her potassium level comes up she can probably go home. I have had Adult Protective Services check into her situation in the past and it was not felt that she was able to be placed against her will and she clearly states that she does not want to be in a nursing home    LOS: 0 days   Bernis Stecher L 12/06/2016, 8:55 AM

## 2016-12-06 NOTE — Care Management Note (Signed)
Case Management Note  Patient Details  Name: [name] MRN: [id number] Date of Birth: Nov 09, 1925  Subjective/Objective:                  Admitted with hypokalemia. Pt is from home, lives alone, she has BIL who is supportive. Pt is ind with ADL's. Uses cane and has RW if needed. Has had license revoked due to dementia and is upset about this. She has had HH int he past. She plans to return home when she is DC'd. She does not feel she needs any HH services. But knows to call PCP office if she changes her mind after DC. She has a life line.   Action/Plan: Pt will DC home with self care pending improvement in K.   Expected Discharge Date:      12/06/2016            Expected Discharge Plan:  Home/Self Care  In-House Referral:  NA  Discharge planning Services  CM Consult  Post Acute Care Choice:  NA Choice offered to:  NA  Status of Service:  Completed, signed off  Malcolm MetroChildress, [name], RN 12/06/2016, 3:08 PM

## 2016-12-06 NOTE — Care Management Obs Status (Signed)
MEDICARE OBSERVATION STATUS NOTIFICATION   Patient Details  Name: [name] MRN: [id number] Date of Birth: 1925/06/18   Medicare Observation Status Notification Given:  Yes    Malcolm MetroChildress, Cotina Freedman Demske, RN 12/06/2016, 3:08 PM

## 2016-12-06 NOTE — Discharge Summary (Signed)
Physician Discharge Summary  Patient ID: [name] MRN: [id number] DOB/AGE: 1925/08/01 83 y.o. Primary Care Physician:Katherene Dinino, Ramon DredgeEdward, MD Admit date: 12/05/2016 Discharge date: 12/06/2016    Discharge Diagnoses:   Principal Problem:   Hypokalemia Active Problems:   Essential hypertension, benign   ATRIAL FIBRILLATION, CHRONIC   Chronic anticoagulation   Dementia with behavioral disturbance   Allergies as of 12/06/2016      Reactions   Bee Venom Swelling   Codeine    REACTION: GI distress      Medication List    STOP taking these medications   doxycycline 100 MG capsule Commonly known as:  VIBRAMYCIN     TAKE these medications   allopurinol 300 MG tablet Commonly known as:  ZYLOPRIM Take 300 mg by mouth Daily.   digoxin 0.125 MG tablet Commonly known as:  LANOXIN Take 0.125 mg by mouth daily.   diltiazem 240 MG 24 hr capsule Commonly known as:  CARDIZEM CD Take 240 mg by mouth daily.   donepezil 5 MG tablet Commonly known as:  ARICEPT Take 1 tablet (5 mg total) by mouth at bedtime.   ELIQUIS 5 MG Tabs tablet Generic drug:  apixaban Take 5 mg by mouth 2 (two) times daily.   metoprolol succinate 100 MG 24 hr tablet Commonly known as:  TOPROL-XL TAKE 1 TABLET (100 MG TOTAL) BY MOUTH DAILY.       Discharged Condition:improved    Consults:none  Significant Diagnostic Studies: Dg Chest Portable 1 View  Result Date: 12/05/2016 CLINICAL DATA:  Mid chest pain beginning last evening. History of COPD and atrial fibrillation. Former smoker. EXAM: PORTABLE CHEST 1 VIEW COMPARISON:  04/24/2016; 02/10/2013; chest CT - 10/09/2016 FINDINGS: Grossly unchanged enlarged cardiac silhouette and mediastinal contours. There is mild rightward deviation of the tracheal air at the level of the aortic arch, unchanged. Stable position of support apparatus. The lungs appear hyperexpanded with flattening of the diaphragms. Minimal linear heterogeneous opacities within the  peripheral aspect of the left lower lung are unchanged and favored to represent atelectasis or scar. No focal airspace opacities. No pleural effusion or pneumothorax. No evidence of edema. No acute osseus abnormalities. IMPRESSION: Hyperexpanded lungs and chronic bronchitic change without superimposed acute cardiopulmonary disease. Electronically Signed   By: Simonne ComeJohn  Watts M.D.   On: 12/05/2016 11:28    Lab Results: Basic Metabolic Panel:  Recent Labs  11/91/4706/05/24 1154  12/06/16 0448 12/06/16 1459  NA 138  --  140 139  K 2.2*  < > 3.1* 3.5  CL 94*  --  102 104  CO2 33*  --  29 27  GLUCOSE 105*  --  107* 119*  BUN 12  --  13 15  CREATININE 1.23*  --  1.05* 1.02*  CALCIUM 8.3*  --  8.3* 8.3*  MG 1.5*  --   --   --   < > = values in this interval not displayed. Liver Function Tests: No results for input(s): AST, ALT, ALKPHOS, BILITOT, PROT, ALBUMIN in the last 72 hours.   CBC:  Recent Labs  12/05/16 1154 12/06/16 0448  WBC 5.4 6.4  NEUTROABS 3.6  --   HGB 12.6 12.8  HCT 36.3 37.5  MCV 95.3 96.6  PLT 142* 152    Recent Results (from the past 240 hour(s))  MRSA PCR Screening     Status: None   Collection Time: 12/05/16 11:49 PM  Result Value Ref Range Status   MRSA by PCR NEGATIVE NEGATIVE Final  Comment:        The GeneXpert MRSA Assay (FDA approved for NASAL specimens only), is one component of a comprehensive MRSA colonization surveillance program. It is not intended to diagnose MRSA infection nor to guide or monitor treatment for MRSA infections.      Hospital Course: she came in with hypokalemia. She has chronic atrial fib, recent history of DVT, dementia and heart failure. She has trouble remembering her meds. Her potassium was replaced and is normal at discharge. I called her drugstore and she takes lasix although not regularly without potassium replacement. I gave them a rx for 20meq kcl daily. I dont think she should live alone but she refuses placement.    Discharge Exam: Blood pressure 106/61, pulse (!) 59, temperature 98.2 F (36.8 C), resp. rate 18, height 5\' 9"  (1.753 m), weight 77.1 kg (170 lb), SpO2 100 %. She is awake. She is confused. She is in atrial fib.   Disposition: home. Refuses home health at least for now      Signed: Akima Slaugh L   12/06/2016, 4:40 PM

## 2017-01-16 ENCOUNTER — Emergency Department (HOSPITAL_COMMUNITY)
Admission: EM | Admit: 2017-01-16 | Discharge: 2017-01-17 | Disposition: A | Discharge status: Home / Self Care | Payer: Medicare Other | Attending: Emergency Medicine | Admitting: Emergency Medicine

## 2017-01-16 ENCOUNTER — Encounter (HOSPITAL_COMMUNITY): Payer: Self-pay | Admitting: Emergency Medicine

## 2017-01-16 ENCOUNTER — Emergency Department (HOSPITAL_COMMUNITY): Payer: Medicare Other

## 2017-01-16 DIAGNOSIS — Z87891 Personal history of nicotine dependence: Secondary | ICD-10-CM | POA: Insufficient documentation

## 2017-01-16 DIAGNOSIS — Z79899 Other long term (current) drug therapy: Secondary | ICD-10-CM | POA: Insufficient documentation

## 2017-01-16 DIAGNOSIS — M7989 Other specified soft tissue disorders: Secondary | ICD-10-CM

## 2017-01-16 DIAGNOSIS — Z7901 Long term (current) use of anticoagulants: Secondary | ICD-10-CM | POA: Diagnosis not present

## 2017-01-16 DIAGNOSIS — E876 Hypokalemia: Secondary | ICD-10-CM | POA: Diagnosis not present

## 2017-01-16 DIAGNOSIS — I824Y1 Acute embolism and thrombosis of unspecified deep veins of right proximal lower extremity: Secondary | ICD-10-CM

## 2017-01-16 DIAGNOSIS — R600 Localized edema: Secondary | ICD-10-CM

## 2017-01-16 DIAGNOSIS — J449 Chronic obstructive pulmonary disease, unspecified: Secondary | ICD-10-CM | POA: Diagnosis not present

## 2017-01-16 LAB — BASIC METABOLIC PANEL
Anion gap: 10 (ref 5–15)
BUN: 11 mg/dL (ref 6–20)
CO2: 30 mmol/L (ref 22–32)
Calcium: 8.8 mg/dL — ABNORMAL LOW (ref 8.9–10.3)
Chloride: 100 mmol/L — ABNORMAL LOW (ref 101–111)
Creatinine, Ser: 0.98 mg/dL (ref 0.44–1.00)
GFR calc Af Amer: 58 mL/min — ABNORMAL LOW (ref >60)
GFR calc non Af Amer: 50 mL/min — ABNORMAL LOW (ref >60)
Glucose, Bld: 83 mg/dL (ref 65–99)
Potassium: 2.8 mmol/L — ABNORMAL LOW (ref 3.5–5.1)
Sodium: 140 mmol/L (ref 135–145)

## 2017-01-16 LAB — CBC WITH DIFFERENTIAL/PLATELET
Basophils Absolute: 0 10*3/uL (ref 0.0–0.1)
Basophils Relative: 0 %
Eosinophils Absolute: 0.1 10*3/uL (ref 0.0–0.7)
Eosinophils Relative: 1 %
HCT: 36.7 % (ref 36.0–46.0)
Hemoglobin: 12.3 g/dL (ref 12.0–15.0)
Lymphocytes Relative: 27 %
Lymphs Abs: 1.5 10*3/uL (ref 0.7–4.0)
MCH: 32.7 pg (ref 26.0–34.0)
MCHC: 33.5 g/dL (ref 30.0–36.0)
MCV: 97.6 fL (ref 78.0–100.0)
Monocytes Absolute: 0.4 10*3/uL (ref 0.1–1.0)
Monocytes Relative: 8 %
Neutro Abs: 3.4 10*3/uL (ref 1.7–7.7)
Neutrophils Relative %: 64 %
Platelets: 157 10*3/uL (ref 150–400)
RBC: 3.76 MIL/uL — ABNORMAL LOW (ref 3.87–5.11)
RDW: 14.6 % (ref 11.5–15.5)
WBC: 5.3 10*3/uL (ref 4.0–10.5)

## 2017-01-16 MED ORDER — POTASSIUM CHLORIDE CRYS ER 20 MEQ PO TBCR
40.0000 meq | EXTENDED_RELEASE_TABLET | Freq: Once | ORAL | Status: AC
  Administered 2017-01-16: 40 meq via ORAL
  Filled 2017-01-16: qty 2

## 2017-01-16 MED ORDER — CEPHALEXIN 500 MG PO CAPS: 500.0000 mg | ORAL_CAPSULE | Freq: Two times a day (BID) | ORAL | 0 refills | Status: DC

## 2017-01-16 MED ORDER — POTASSIUM CHLORIDE CRYS ER 20 MEQ PO TBCR: 20.0000 meq | EXTENDED_RELEASE_TABLET | Freq: Two times a day (BID) | ORAL | 0 refills | Status: DC

## 2017-01-16 MED ORDER — CEPHALEXIN 500 MG PO CAPS
500.0000 mg | ORAL_CAPSULE | Freq: Once | ORAL | Status: AC
  Administered 2017-01-16: 500 mg via ORAL
  Filled 2017-01-16: qty 1

## 2017-01-16 NOTE — Discharge Instructions (Signed)
Take the medications as prescribed.  Follow-up with your primary care doctorext week. Return tomorrow to have the ultrasound test

## 2017-01-16 NOTE — ED Triage Notes (Signed)
Pt reports BLE leg swelling x1 week. Pt denies any known injury.

## 2017-01-16 NOTE — ED Provider Notes (Signed)
[facility]-EMERGENCY DEPT Provider Note   CSN: [id number] Arrival date & time: 01/16/17  1836     History   Chief Complaint Chief Complaint  Patient presents with  . Leg Swelling    HPI [name] is a 83 y.o. female.  HPI Patient presents to the emergency room for evaluation of bilateral leg swelling.  Patient states she notices symptoms about a week ago. She does not recall any falls but she did get some scratches on her legs while she was gardening. She has noticed some clear fluid seeping from her legs.  She denies any chest pain or shortness of breath. No fevers or chills. She has not seen anyone for this since it started Past Medical History:  Diagnosis Date  . Arthritis   . Bilateral chronic knee pain   . Brain aneurysm   . COPD (chronic obstructive pulmonary disease) (HCC)   . DDD (degenerative disc disease), lumbar   . DJD (degenerative joint disease)   . DVT (deep venous thrombosis) (HCC)   . Gout   . Peripheral neuropathy   . Permanent atrial fibrillation [facility](HCC)    MDT [id number] pacemaker 03-26-2013 by Dr Ladona Ridgelaylor  . Pulmonary embolism [facility](HCC)     Patient Active Problem List   Diagnosis Date Noted  . Hypokalemia 12/05/2016  . Dementia with behavioral disturbance 04/25/2016  . Noncompliance with medication regimen 04/24/2016  . UTI (urinary tract infection) 04/18/2016  . Chest pain 02/10/2014  . Edema 02/10/2014  . Encounter for therapeutic drug monitoring 08/13/2013  . Chronic anticoagulation 07/22/2010  . HYPOTENSION, ORTHOSTATIC 06/15/2010  . Essential hypertension, benign 04/06/2009  . Cardiac pacemaker in situ 04/06/2009  . PULMONARY EMBOLISM 01/18/2009  . ATRIAL FIBRILLATION, CHRONIC 01/18/2009  . BRADYCARDIA-TACHYCARDIA SYNDROME 01/18/2009  . DVT 01/18/2009    Past Surgical History:  Procedure Laterality Date  . CARDIAC CATHETERIZATION  2001  . Craniotomy clipping  06-2002   unruptured  . Left paraclinoid segment anurysum     [facility]  . PACEMAKER INSERTION  09-13-1999; 03-26-2013   PPM gen change by Dr Ladona Ridgelaylor 03-26-2013 - MDT [id number]  . PERMANENT PACEMAKER GENERATOR CHANGE N/A 03/26/2013   Procedure: PERMANENT PACEMAKER GENERATOR CHANGE;  Surgeon: Marinus MawGregg W Taylor, MD;  [facility];  Service: Cardiovascular;  Laterality: N/A;    OB History    No data available       Home Medications    Prior to Admission medications   Medication Sig Start Date End Date Taking? Authorizing Provider  allopurinol (ZYLOPRIM) 300 MG tablet Take 300 mg by mouth Daily.  04/07/11   [provider]  apixaban (ELIQUIS) 5 MG TABS tablet Take 5 mg by mouth 2 (two) times daily.    [provider]  digoxin (LANOXIN) 0.125 MG tablet Take 0.125 mg by mouth daily.    [provider]  diltiazem (CARDIZEM CD) 240 MG 24 hr capsule Take 240 mg by mouth daily.      [provider]  donepezil (ARICEPT) 5 MG tablet Take 1 tablet (5 mg total) by mouth at bedtime. 04/25/16   Kari BaarsHawkins, Edward, MD  metoprolol succinate (TOPROL-XL) 100 MG 24 hr tablet TAKE 1 TABLET (100 MG TOTAL) BY MOUTH DAILY. 06/20/15   Dyann KiefLenze, Michele M, PA-C    Family History Family History  Problem Relation Age of Onset  . Heart attack Mother   . Heart attack Father   . Alzheimer's disease Sister     Social History Social  History  Substance Use Topics  . Smoking status: Former Smoker    Packs/day: 0.25    Years: 35.00    Types: Cigarettes    Start date: 12/27/1977    Quit date: 05/26/2010  . Smokeless tobacco: Never Used  . Alcohol use No     Allergies   Bee venom and Codeine   Review of Systems Review of Systems  All other systems reviewed and are negative.    Physical Exam Updated Vital Signs BP 138/62 (BP Location: Right Arm)   Pulse 64   Temp 98.4 F (36.9 C) (Oral)   Resp 18   Ht 1.765 m (5' 9.5")   Wt 77.1 kg (170 lb)   SpO2 100%   BMI 24.74 kg/m   Physical Exam  Constitutional: No distress.    HENT:  Head: Normocephalic and atraumatic.  Right Ear: External ear normal.  Left Ear: External ear normal.  Eyes: Conjunctivae are normal. Right eye exhibits no discharge. Left eye exhibits no discharge. No scleral icterus.  Neck: Neck supple. No tracheal deviation present.  Cardiovascular: Normal rate, regular rhythm and intact distal pulses.   Pulmonary/Chest: Effort normal and breath sounds normal. No stridor. No respiratory distress. She has no wheezes. She has no rales.  Abdominal: Soft. Bowel sounds are normal. She exhibits no distension. There is no tenderness. There is no rebound and no guarding.  Musculoskeletal: She exhibits no edema or tenderness.  Neurological: She is alert. She has normal strength. No cranial nerve deficit (no facial droop, extraocular movements intact, no slurred speech) or sensory deficit. She exhibits normal muscle tone. She displays no seizure activity. Coordination normal.  Skin: Skin is warm and dry. She is not diaphoretic. There is erythema.  Dry, scaling skin in her lower extremities edema noted of bilateral lower extremities from the knee down, some erythema of the feet and lower extremities below the knee, serous drainage from the wounds on her lower legs, no purulent drainage, no lymphangitic streaking  Psychiatric: She has a normal mood and affect.  Nursing note and vitals reviewed.    ED Treatments / Results  Labs (all labs ordered are listed, but only abnormal results are displayed) Labs Reviewed  CBC WITH DIFFERENTIAL/PLATELET - Abnormal; Notable for the following:       Result Value   RBC 3.76 (*)    All other components within normal limits  BASIC METABOLIC PANEL - Abnormal; Notable for the following:    Potassium 2.8 (*)    Chloride 100 (*)    Calcium 8.8 (*)    GFR calc non Af Amer 50 (*)    GFR calc Af Amer 58 (*)    All other components within normal limits     Radiology Dg Chest 2 View  Result Date: 01/16/2017 CLINICAL  DATA:  CHF, bilateral leg swelling for 1 week. EXAM: CHEST  2 VIEW COMPARISON:  Chest radiograph December 05, 2016 FINDINGS: Cardiac silhouette is mildly enlarged. Mediastinal silhouette is nonsuspicious. Calcified aortic knob. No pleural effusion or focal consolidation. Pulmonary vasculature appears normal. No pneumothorax. Dual lead RIGHT cardiac pacemaker in situ. Soft tissue planes and included osseous structure nonsuspicious. IMPRESSION: Mild cardiomegaly.  No acute pulmonary process. Electronically Signed   By: Awilda Metro M.D.   On: 01/16/2017 22:08   US Venous Img Lower Bilateral  Result Date: 01/17/2017 CLINICAL DATA:  Bilateral leg edema R60.0 (ICD-10-CM) Deep vein thrombosis (DVT) of proximal vein of right lower extremity, unspecified chronicity (HCC) I82.4Y1 (ICD-10-CM) EXAM:  BILATERAL LOWER EXTREMITY VENOUS DOPPLER ULTRASOUND TECHNIQUE: Gray-scale sonography with graded compression, as well as color Doppler and duplex ultrasound were performed to evaluate the lower extremity deep venous systems from the level of the common femoral vein and including the common femoral, femoral, profunda femoral, popliteal and calf veins including the posterior tibial, peroneal and gastrocnemius veins when visible. The superficial great saphenous vein was also interrogated. Spectral Doppler was utilized to evaluate flow at rest and with distal augmentation maneuvers in the common femoral, femoral and popliteal veins. COMPARISON:  None. FINDINGS: RIGHT LOWER EXTREMITY Common Femoral Vein: No evidence of thrombus. Normal compressibility, respiratory phasicity and response to augmentation. Saphenofemoral Junction: No evidence of thrombus. Normal compressibility and flow on color Doppler imaging. Profunda Femoral Vein: No evidence of thrombus. Normal compressibility and flow on color Doppler imaging. Femoral Vein: No evidence of thrombus. Normal compressibility, respiratory phasicity and response to augmentation.  Popliteal Vein: No evidence of thrombus. Normal compressibility, respiratory phasicity and response to augmentation. Calf Veins: No evidence of thrombus. Normal compressibility and flow on color Doppler imaging. Superficial Great Saphenous Vein: No evidence of thrombus. Normal compressibility and flow on color Doppler imaging. Venous Reflux:  None. Other Findings:  None. LEFT LOWER EXTREMITY Common Femoral Vein: No evidence of thrombus. Normal compressibility, respiratory phasicity and response to augmentation. Saphenofemoral Junction: No evidence of thrombus. Normal compressibility and flow on color Doppler imaging. Profunda Femoral Vein: No evidence of thrombus. Normal compressibility and flow on color Doppler imaging. Femoral Vein: No evidence of thrombus. Normal compressibility, respiratory phasicity and response to augmentation. Popliteal Vein: No evidence of thrombus. Normal compressibility, respiratory phasicity and response to augmentation. Calf Veins: No evidence of thrombus. Normal compressibility and flow on color Doppler imaging. Superficial Great Saphenous Vein: No evidence of thrombus. Normal compressibility and flow on color Doppler imaging. Venous Reflux:  None. Other Findings: Bilateral lower extremity edema, right greater than left, most evident in the calves. IMPRESSION: No evidence of DVT within either lower extremity. Electronically Signed   By: Amie Portland M.D.   On: 01/17/2017 15:11    Procedures Procedures (including critical care time)  Medications Ordered in ED Medications - No data to display   Initial Impression / Assessment and Plan / ED Course  I have reviewed the triage vital signs and the nursing notes.  Pertinent labs & imaging results that were available during my care of the patient were reviewed by me and considered in my medical decision making (see chart for details).   LAbs notable for hypokalemia.  Potassium replaced in the ED and given outpatient rx.  Doubt  chf.  DVT unlikely but will have her return to ED tomorrow for [place].  Will rx abx, as the skin appearance suggests bacterial superinfection, mild cellulitis.  9/13 Next day [place] was negative for DVT  Final Clinical Impressions(s) / ED Diagnoses   Final diagnoses:  None    New Prescriptions New Prescriptions   No medications on file     Linwood Dibbles, MD 01/17/17 1606

## 2017-01-17 ENCOUNTER — Ambulatory Visit (HOSPITAL_COMMUNITY)
Admission: RE | Admit: 2017-01-17 | Discharge: 2017-01-17 | Disposition: A | Discharge status: Home / Self Care | Payer: Medicare Other | Source: Ambulatory Visit | Attending: Emergency Medicine | Admitting: Emergency Medicine

## 2017-01-17 DIAGNOSIS — I824Y1 Acute embolism and thrombosis of unspecified deep veins of right proximal lower extremity: Secondary | ICD-10-CM

## 2017-01-17 DIAGNOSIS — R600 Localized edema: Secondary | ICD-10-CM

## 2017-02-16 ENCOUNTER — Emergency Department (HOSPITAL_COMMUNITY)
Admission: EM | Admit: 2017-02-16 | Discharge: 2017-02-16 | Disposition: A | Discharge status: Home / Self Care | Payer: Medicare Other | Attending: Emergency Medicine | Admitting: Emergency Medicine

## 2017-02-16 ENCOUNTER — Encounter (HOSPITAL_COMMUNITY): Payer: Self-pay | Admitting: Emergency Medicine

## 2017-02-16 DIAGNOSIS — W19XXXA Unspecified fall, initial encounter: Secondary | ICD-10-CM

## 2017-02-16 DIAGNOSIS — Y929 Unspecified place or not applicable: Secondary | ICD-10-CM | POA: Insufficient documentation

## 2017-02-16 DIAGNOSIS — W0110XA Fall on same level from slipping, tripping and stumbling with subsequent striking against unspecified object, initial encounter: Secondary | ICD-10-CM | POA: Insufficient documentation

## 2017-02-16 DIAGNOSIS — S81811A Laceration without foreign body, right lower leg, initial encounter: Secondary | ICD-10-CM | POA: Diagnosis not present

## 2017-02-16 DIAGNOSIS — Z87891 Personal history of nicotine dependence: Secondary | ICD-10-CM | POA: Diagnosis not present

## 2017-02-16 DIAGNOSIS — Y998 Other external cause status: Secondary | ICD-10-CM | POA: Diagnosis not present

## 2017-02-16 DIAGNOSIS — Z79899 Other long term (current) drug therapy: Secondary | ICD-10-CM | POA: Diagnosis not present

## 2017-02-16 DIAGNOSIS — Y9389 Activity, other specified: Secondary | ICD-10-CM | POA: Diagnosis not present

## 2017-02-16 DIAGNOSIS — F0391 Unspecified dementia with behavioral disturbance: Secondary | ICD-10-CM | POA: Insufficient documentation

## 2017-02-16 DIAGNOSIS — Z950 Presence of cardiac pacemaker: Secondary | ICD-10-CM | POA: Insufficient documentation

## 2017-02-16 DIAGNOSIS — J449 Chronic obstructive pulmonary disease, unspecified: Secondary | ICD-10-CM | POA: Diagnosis not present

## 2017-02-16 NOTE — Discharge Instructions (Signed)
Keep wound clean and dry. Try to allow Steri-Strips to stay on for several days until they peel off by themselves.

## 2017-02-16 NOTE — ED Provider Notes (Signed)
[facility]-EMERGENCY DEPT Provider Note   CSN: [id number] Arrival date & time: 02/16/17  1219     History   Chief Complaint Chief Complaint  Patient presents with  . Fall    HPI [name] is a 83 y.o. female.  Level V caveat for dementia.  Accidental trip and fall striking her right lower extremity. There is a skin tear on the lateral aspect of the fibula. No other obvious injuries. She is alert and pleasant.      Past Medical History:  Diagnosis Date  . Arthritis   . Bilateral chronic knee pain   . Brain aneurysm   . COPD (chronic obstructive pulmonary disease) (HCC)   . DDD (degenerative disc disease), lumbar   . DJD (degenerative joint disease)   . DVT (deep venous thrombosis) (HCC)   . Gout   . Peripheral neuropathy   . Permanent atrial fibrillation [facility])    MDT [id number] pacemaker 03-26-2013 by Dr Ladona Ridgel  . Pulmonary embolism [facility])     Patient Active Problem List   Diagnosis Date Noted  . Hypokalemia 12/05/2016  . Dementia with behavioral disturbance 04/25/2016  . Noncompliance with medication regimen 04/24/2016  . UTI (urinary tract infection) 04/18/2016  . Chest pain 02/10/2014  . Edema 02/10/2014  . Encounter for therapeutic drug monitoring 08/13/2013  . Chronic anticoagulation 07/22/2010  . HYPOTENSION, ORTHOSTATIC 06/15/2010  . Essential hypertension, benign 04/06/2009  . Cardiac pacemaker in situ 04/06/2009  . PULMONARY EMBOLISM 01/18/2009  . ATRIAL FIBRILLATION, CHRONIC 01/18/2009  . BRADYCARDIA-TACHYCARDIA SYNDROME 01/18/2009  . DVT 01/18/2009    Past Surgical History:  Procedure Laterality Date  . CARDIAC CATHETERIZATION  2001  . Craniotomy clipping  06-2002   unruptured  . Left paraclinoid segment anurysum     [facility]  . PACEMAKER INSERTION  09-13-1999; 03-26-2013   PPM gen change by Dr Ladona Ridgel 03-26-2013 - MDT [id number]  . PERMANENT PACEMAKER GENERATOR CHANGE N/A 03/26/2013   Procedure: PERMANENT PACEMAKER  GENERATOR CHANGE;  Surgeon: Marinus Maw, MD;  Location[facility] C[facility] LAB;  Service: Cardiovascular;  Laterality: N/A;    OB History    No data available       Home Medications    Prior to Admission medications   Medication Sig Start Date End Date Taking? Authorizing Provider  allopurinol (ZYLOPRIM) 300 MG tablet Take 300 mg by mouth Daily.  04/07/11   [provider]  apixaban (ELIQUIS) 5 MG TABS tablet Take 5 mg by mouth 2 (two) times daily.    [provider]  cephALEXin (KEFLEX) 500 MG capsule Take 1 capsule (500 mg total) by mouth 2 (two) times daily. 01/16/17   Linwood Dibbles, MD  digoxin (LANOXIN) 0.125 MG tablet Take 0.125 mg by mouth daily.    [provider]  diltiazem (CARDIZEM CD) 240 MG 24 hr capsule Take 240 mg by mouth daily.      [provider]  donepezil (ARICEPT) 5 MG tablet Take 1 tablet (5 mg total) by mouth at bedtime. 04/25/16   Kari Baars, MD  metoprolol succinate (TOPROL-XL) 100 MG 24 hr tablet TAKE 1 TABLET (100 MG TOTAL) BY MOUTH DAILY. 06/20/15   Dyann Kief, PA-C  potassium chloride SA (K-DUR,KLOR-CON) 20 MEQ tablet Take 1 tablet (20 mEq total) by mouth 2 (two) times daily. 01/16/17   Linwood Dibbles, MD    Family History Family History  Problem Relation Age of Onset  . Heart attack Mother   . Heart attack  Father   . Alzheimer's disease Sister     Social History Social History  Substance Use Topics  . Smoking status: Former Smoker    Packs/day: 0.25    Years: 35.00    Types: Cigarettes    Start date: 12/27/1977    Quit date: 05/26/2010  . Smokeless tobacco: Never Used  . Alcohol use No     Allergies   Bee venom and Codeine   Review of Systems Review of Systems  Unable to perform ROS: Dementia     Physical Exam Updated Vital Signs BP 111/68 (BP Location: Right Arm)   Pulse 62   Temp 97.7 F (36.5 C) (Oral)   Resp 18   Ht  (1.753 m)   Wt 77.1 kg (170 lb)   SpO2 100%   BMI 25.10 kg/m    Physical Exam  Constitutional: She appears well-developed and well-nourished.  HENT:  Head: Normocephalic and atraumatic.  Eyes: Conjunctivae are normal.  Neck: Neck supple.  Cardiovascular: Normal rate and regular rhythm.   Pulmonary/Chest: Effort normal and breath sounds normal.  Abdominal: Soft. Bowel sounds are normal.  Musculoskeletal: Normal range of motion.  Neurological: She is alert.  Skin:  6 cm elliptical skin tear on right mid lateral fibula area  Psychiatric: She has a normal mood and affect. Her behavior is normal.  Nursing note and vitals reviewed.    ED Treatments / Results  Labs (all labs ordered are listed, but only abnormal results are displayed) Labs Reviewed - No data to display  EKG  EKG Interpretation None       Radiology No results found.  Procedures .Marland KitchenLaceration Repair Date/Time: 02/16/2017 1:12 PM Performed by: Donnetta Hutching Authorized by: Donnetta Hutching   Consent:    Consent obtained:  Verbal   Consent given by:  Patient   Risks discussed:  Poor cosmetic result Anesthesia (see MAR for exact dosages):    Anesthesia method:  None Laceration details:    Location:  Leg   Leg location:  R lower leg   Wound length (cm): 6.0. Repair type:    Repair type:  Simple Exploration:    Contaminated: no   Treatment:    Visualized foreign bodies/material removed: no   Skin repair:    Repair method:  Steri-Strips Approximation:    Approximation:  Loose   Vermilion border: well-aligned   Post-procedure details:    Dressing:  Non-adherent dressing   Patient tolerance of procedure:  Tolerated well, no immediate complications   (including critical care time)  Medications Ordered in ED Medications - No data to display   Initial Impression / Assessment and Plan / ED Course  I have reviewed the triage vital signs and the nursing notes.  Pertinent labs & imaging results that were available during my care of the patient were reviewed by me and  considered in my medical decision making (see chart for details).     Status post fall with 6 cm skin tear to right lower extremity. Steri-Strips applied. No other injuries.  Final Clinical Impressions(s) / ED Diagnoses   Final diagnoses:  Fall, initial encounter  Laceration of right lower extremity, initial encounter    New Prescriptions New Prescriptions   No medications on file     Donnetta Hutching, MD 02/16/17 1316

## 2017-02-16 NOTE — ED Notes (Signed)
Report called to [facility], per staff they will send someone to pick her up and should be here within an hour.

## 2017-02-16 NOTE — ED Triage Notes (Signed)
Pt sent from [facility].  Was getting out of chair and twisted right leg.  Skin tear to right leg.

## 2017-02-24 ENCOUNTER — Encounter (HOSPITAL_COMMUNITY): Payer: Self-pay | Admitting: Emergency Medicine

## 2017-02-24 ENCOUNTER — Emergency Department (HOSPITAL_COMMUNITY): Payer: Medicare Other

## 2017-02-24 ENCOUNTER — Emergency Department (HOSPITAL_COMMUNITY)
Admission: EM | Admit: 2017-02-24 | Discharge: 2017-02-24 | Disposition: A | Discharge status: Home / Self Care | Payer: Medicare Other | Attending: Emergency Medicine | Admitting: Emergency Medicine

## 2017-02-24 DIAGNOSIS — Z79899 Other long term (current) drug therapy: Secondary | ICD-10-CM | POA: Insufficient documentation

## 2017-02-24 DIAGNOSIS — I10 Essential (primary) hypertension: Secondary | ICD-10-CM | POA: Diagnosis not present

## 2017-02-24 DIAGNOSIS — I4891 Unspecified atrial fibrillation: Secondary | ICD-10-CM | POA: Diagnosis not present

## 2017-02-24 DIAGNOSIS — R079 Chest pain, unspecified: Secondary | ICD-10-CM | POA: Diagnosis not present

## 2017-02-24 DIAGNOSIS — Z87891 Personal history of nicotine dependence: Secondary | ICD-10-CM | POA: Diagnosis not present

## 2017-02-24 DIAGNOSIS — J449 Chronic obstructive pulmonary disease, unspecified: Secondary | ICD-10-CM | POA: Insufficient documentation

## 2017-02-24 DIAGNOSIS — Z7901 Long term (current) use of anticoagulants: Secondary | ICD-10-CM | POA: Diagnosis not present

## 2017-02-24 DIAGNOSIS — Z950 Presence of cardiac pacemaker: Secondary | ICD-10-CM | POA: Insufficient documentation

## 2017-02-24 DIAGNOSIS — F0391 Unspecified dementia with behavioral disturbance: Secondary | ICD-10-CM | POA: Diagnosis not present

## 2017-02-24 LAB — BASIC METABOLIC PANEL
Anion gap: 8 (ref 5–15)
BUN: 14 mg/dL (ref 6–20)
CO2: 27 mmol/L (ref 22–32)
Calcium: 9.1 mg/dL (ref 8.9–10.3)
Chloride: 107 mmol/L (ref 101–111)
Creatinine, Ser: 0.75 mg/dL (ref 0.44–1.00)
GFR calc Af Amer: >60 mL/min (ref >60)
GFR calc non Af Amer: >60 mL/min (ref >60)
Glucose, Bld: 97 mg/dL (ref 65–99)
Potassium: 3.8 mmol/L (ref 3.5–5.1)
Sodium: 142 mmol/L (ref 135–145)

## 2017-02-24 LAB — CBC
HCT: 38.4 % (ref 36.0–46.0)
Hemoglobin: 12.7 g/dL (ref 12.0–15.0)
MCH: 33.1 pg (ref 26.0–34.0)
MCHC: 33.1 g/dL (ref 30.0–36.0)
MCV: 100 fL (ref 78.0–100.0)
Platelets: 134 10*3/uL — ABNORMAL LOW (ref 150–400)
RBC: 3.84 MIL/uL — ABNORMAL LOW (ref 3.87–5.11)
RDW: 14.9 % (ref 11.5–15.5)
WBC: 7.1 10*3/uL (ref 4.0–10.5)

## 2017-02-24 LAB — TROPONIN I: Troponin I: <0.03 ng/mL (ref <0.03)

## 2017-02-24 MED ORDER — SODIUM CHLORIDE 0.9 % IV BOLUS (SEPSIS)
1000.0000 mL | Freq: Once | INTRAVENOUS | Status: AC
  Administered 2017-02-24: 1000 mL via INTRAVENOUS

## 2017-02-24 NOTE — ED Notes (Signed)
[facility]. C/o dizziness. Unsteady gait. EDP notified.

## 2017-02-24 NOTE — ED Notes (Signed)
Caregiver Tracy MontaneBurnell Cross [phone].

## 2017-02-24 NOTE — ED Notes (Signed)
Attempted to call facility for transport home with no success.

## 2017-02-24 NOTE — ED Provider Notes (Signed)
[facility] EMERGENCY DEPARTMENT Provider Note   CSN: [id number] Arrival date & time: 02/24/17  4098     History   Chief Complaint Chief Complaint  Patient presents with  . Chest Pain    HPI [name] is a 83 y.o. female.  HPI Level 5 caveat: Dementia  Patient presents complaining of pain around her pacemaker site on her right chest.  She continues to touch and manipulate her pacemaker during the history.  Patient reports that is somewhat tender around there.  She has not had any fevers.  She is unable to provide any reasonable history as she has dementia and continues to drift off conversation and began telling stories of the past.    Past Medical History:  Diagnosis Date  . Arthritis   . Bilateral chronic knee pain   . Brain aneurysm   . COPD (chronic obstructive pulmonary disease) (HCC)   . DDD (degenerative disc disease), lumbar   . DJD (degenerative joint disease)   . DVT (deep venous thrombosis) (HCC)   . Gout   . Peripheral neuropathy   . Permanent atrial fibrillation [facility])    MDT [id number] pacemaker 03-26-2013 by Dr Ladona Ridgel  . Pulmonary embolism [facility])     Patient Active Problem List   Diagnosis Date Noted  . Hypokalemia 12/05/2016  . Dementia with behavioral disturbance 04/25/2016  . Noncompliance with medication regimen 04/24/2016  . UTI (urinary tract infection) 04/18/2016  . Chest pain 02/10/2014  . Edema 02/10/2014  . Encounter for therapeutic drug monitoring 08/13/2013  . Chronic anticoagulation 07/22/2010  . HYPOTENSION, ORTHOSTATIC 06/15/2010  . Essential hypertension, benign 04/06/2009  . Cardiac pacemaker in situ 04/06/2009  . PULMONARY EMBOLISM 01/18/2009  . ATRIAL FIBRILLATION, CHRONIC 01/18/2009  . BRADYCARDIA-TACHYCARDIA SYNDROME 01/18/2009  . DVT 01/18/2009    Past Surgical History:  Procedure Laterality Date  . CARDIAC CATHETERIZATION  2001  . Craniotomy clipping  06-2002   unruptured  . Left paraclinoid segment anurysum       [facility]  . PACEMAKER INSERTION  09-13-1999; 03-26-2013   PPM gen change by Dr Ladona Ridgel 03-26-2013 - MDT [id number]  . PERMANENT PACEMAKER GENERATOR CHANGE N/A 03/26/2013   Procedure: PERMANENT PACEMAKER GENERATOR CHANGE;  Surgeon: Marinus Maw, MD;  Location: [facility] CATH LAB;  Service: Cardiovascular;  Laterality: N/A;    OB History    No data available       Home Medications    Prior to Admission medications   Medication Sig Start Date End Date Taking? Authorizing Provider  allopurinol (ZYLOPRIM) 300 MG tablet Take 300 mg by mouth Daily.  04/07/11  Yes [provider]  apixaban (ELIQUIS) 5 MG TABS tablet Take 5 mg by mouth 2 (two) times daily.   Yes [provider]  clotrimazole (LOTRIMIN) 1 % cream Apply 1 application topically 2 (two) times daily.   Yes [provider]  digoxin (LANOXIN) 0.125 MG tablet Take 0.0625 mg by mouth daily.    Yes [provider]  diltiazem (CARDIZEM CD) 240 MG 24 hr capsule Take 240 mg by mouth daily.     Yes [provider]  donepezil (ARICEPT) 5 MG tablet Take 1 tablet (5 mg total) by mouth at bedtime. 04/25/16  Yes Kari Baars, MD  furosemide (LASIX) 40 MG tablet Take 40 mg by mouth daily as needed. swelling   Yes [provider]  hydrocortisone (ANUSOL-HC) 2.5 % rectal cream Place 1 application rectally 3 times/day as needed-between meals &  bedtime for hemorrhoids.    Yes [provider]  metoprolol succinate (TOPROL-XL) 100 MG 24 hr tablet TAKE 1 TABLET (100 MG TOTAL) BY MOUTH DAILY. 06/20/15  Yes Dyann Kief, PA-C  cephALEXin (KEFLEX) 500 MG capsule Take 1 capsule (500 mg total) by mouth 2 (two) times daily. Patient not taking: Reported on 02/24/2017 01/16/17   Linwood Dibbles, MD  potassium chloride SA (K-DUR,KLOR-CON) 20 MEQ tablet Take 1 tablet (20 mEq total) by mouth 2 (two) times daily. Patient not taking: Reported on 02/24/2017 01/16/17   Linwood Dibbles, MD     Family History Family History  Problem Relation Age of Onset  . Heart attack Mother   . Heart attack Father   . Alzheimer's disease Sister     Social History Social History  Substance Use Topics  . Smoking status: Former Smoker    Packs/day: 0.25    Years: 35.00    Types: Cigarettes    Start date: 12/27/1977    Quit date: 05/26/2010  . Smokeless tobacco: Never Used  . Alcohol use No     Allergies   Bee venom and Codeine   Review of Systems Review of Systems  Unable to perform ROS: Dementia     Physical Exam Updated Vital Signs BP 140/67   Pulse (!) 54   Temp 97.6 F (36.4 C) (Oral)   Resp 15   Ht 5\' 9"  (1.753 m)   Wt 77.1 kg (170 lb)   SpO2 97%   BMI 25.10 kg/m   Physical Exam  Constitutional: She appears well-developed and well-nourished.  HENT:  Head: Normocephalic.  Eyes: EOM are normal.  Neck: Normal range of motion.  Pulmonary/Chest: Effort normal and breath sounds normal.  Abdominal: She exhibits no distension. There is no tenderness.  Musculoskeletal: Normal range of motion.  The pacemaker pocket appears normal in the right upper chest.  No surrounding erythema.  No drainage.  Neurological: She is alert.  Psychiatric: She has a normal mood and affect.  Nursing note and vitals reviewed.    ED Treatments / Results  Labs (all labs ordered are listed, but only abnormal results are displayed) Labs Reviewed  CBC - Abnormal; Notable for the following:       Result Value   RBC 3.84 (*)    Platelets 134 (*)    All other components within normal limits  BASIC METABOLIC PANEL  TROPONIN I    EKG  EKG Interpretation  Date/Time:  Sunday February 24 2017 08:43:00 EDT Ventricular Rate:  72 PR Interval:    QRS Duration: 91 QT Interval:  396 QTC Calculation: 474 R Axis:   106 Text Interpretation:  Afib/flut and V-paced complexes No further rhythm analysis attempted due to paced rhythm Right axis deviation Abnormal T, consider ischemia,  diffuse leads No significant change was found Confirmed by Azalia Bilis ([id number]) on 02/24/2017 12:12:03 PM       Radiology Dg Chest 2 View  Result Date: 02/24/2017 CLINICAL DATA:  Acute chest pain. EXAM: CHEST  2 VIEW COMPARISON:  01/16/2017 and prior radiographs FINDINGS: Cardiomegaly and right-sided pacemaker again noted. COPD type changes are again noted. There is no evidence of focal airspace disease, pulmonary edema, suspicious pulmonary nodule/mass, pleural effusion, or pneumothorax. No acute bony abnormalities are identified. IMPRESSION: No evidence of acute cardiopulmonary disease. Cardiomegaly and COPD type changes. Electronically Signed   By: Harmon Pier M.D.   On: 02/24/2017 09:52    Procedures Procedures (including critical care time)  Medications Ordered  in ED Medications  sodium chloride 0.9 % bolus 1,000 mL (0 mLs Intravenous Stopped 02/24/17 1155)     Initial Impression / Assessment and Plan / ED Course  I have reviewed the triage vital signs and the nursing notes.  Pertinent labs & imaging results that were available during my care of the patient were reviewed by me and considered in my medical decision making (see chart for details).     No significant abnormalities found.  Pacemaker appears to be functioning well.  No overt signs of infection of the pacemaker pocket.  Remainder of laboratory studies and x-ray without abnormality.  Final Clinical Impressions(s) / ED Diagnoses   Final diagnoses:  None    New Prescriptions New Prescriptions   No medications on file     Azalia Bilisampos, Ruble Pumphrey, MD 02/24/17 1215

## 2017-02-24 NOTE — ED Triage Notes (Signed)
Patient brought in via EMS. Alert and oriented. Airway patent. Patient c/o right side chest pain. Per patient pain x1 week. Patient states "I think it's my [pacemaker." Patient reports soreness at site of pace maker. No redness noted. Per patient tender at site with palpitation. Patient reports shortness of breath, dizziness, and nausea. Per EMS patient pacing on their monitor. Patient reports improvement in pain since EMS arriving and transported her to hospital.

## 2017-02-24 NOTE — ED Notes (Signed)
[name] notified pt is ready for d/c and will provide d/c transportation.

## 2017-04-23 ENCOUNTER — Emergency Department (HOSPITAL_COMMUNITY)
Admission: EM | Admit: 2017-04-23 | Discharge: 2017-04-23 | Disposition: A | Discharge status: Home / Self Care | Payer: Medicare Other | Attending: Emergency Medicine | Admitting: Emergency Medicine

## 2017-04-23 ENCOUNTER — Emergency Department (HOSPITAL_COMMUNITY): Payer: Medicare Other

## 2017-04-23 ENCOUNTER — Other Ambulatory Visit: Payer: Self-pay

## 2017-04-23 DIAGNOSIS — Z950 Presence of cardiac pacemaker: Secondary | ICD-10-CM | POA: Diagnosis not present

## 2017-04-23 DIAGNOSIS — Z7901 Long term (current) use of anticoagulants: Secondary | ICD-10-CM | POA: Diagnosis not present

## 2017-04-23 DIAGNOSIS — J40 Bronchitis, not specified as acute or chronic: Secondary | ICD-10-CM | POA: Diagnosis not present

## 2017-04-23 DIAGNOSIS — J449 Chronic obstructive pulmonary disease, unspecified: Secondary | ICD-10-CM | POA: Diagnosis not present

## 2017-04-23 DIAGNOSIS — F0390 Unspecified dementia without behavioral disturbance: Secondary | ICD-10-CM | POA: Diagnosis not present

## 2017-04-23 DIAGNOSIS — Z79899 Other long term (current) drug therapy: Secondary | ICD-10-CM | POA: Insufficient documentation

## 2017-04-23 DIAGNOSIS — Z87891 Personal history of nicotine dependence: Secondary | ICD-10-CM | POA: Diagnosis not present

## 2017-04-23 DIAGNOSIS — R05 Cough: Secondary | ICD-10-CM | POA: Diagnosis present

## 2017-04-23 MED ORDER — OXYMETAZOLINE HCL 0.05 % NA SOLN
2.0000 | Freq: Once | NASAL | Status: AC
  Administered 2017-04-23: 2 via NASAL
  Filled 2017-04-23: qty 15

## 2017-04-23 MED ORDER — GUAIFENESIN-DM 100-10 MG/5ML PO SYRP
5.0000 mL | ORAL_SOLUTION | ORAL | Status: DC | PRN
  Administered 2017-04-23: 5 mL via ORAL
  Filled 2017-04-23: qty 5

## 2017-04-23 MED ORDER — DEXTROMETHORPHAN-GUAIFENESIN 10-100 MG/5ML PO SYRP: ORAL_SOLUTION | ORAL | 0 refills | Status: DC

## 2017-04-23 NOTE — ED Triage Notes (Signed)
C/o of cough and general not feeling well x4days.  Started on z-pack x 1 day. Congested non productive cough

## 2017-04-23 NOTE — ED Provider Notes (Addendum)
[facility] EMERGENCY DEPARTMENT Provider Note   CSN: [id number] Arrival date & time: 04/23/17  1606     History   Chief Complaint Chief Complaint  Patient presents with  . Cough    since saturday (4days)    HPI [name] is a 83 y.o. female.  Patient is an 83 year old female who presents to the emergency department with complaint of cough.  Patient has a history of chronic obstructive pulmonary disease.  She has had a cough over the last for 5 days.  This is been accompanied by body aches and generally not feeling well.  She she states that her primary physician called her in a Z-Pak 1 day ago.  She states she remains congested and having a nonproductive cough.  She does not recall having any high fever.  She has not had nausea or vomiting to be reported.  No unusual rash noted.  No excessive diarrhea.  No hemoptysis reported.  She presents now for additional evaluation concerning her cough and not feeling well.   The history is provided by the patient.  Cough  Pertinent negatives include no chest pain, no shortness of breath and no wheezing.    Past Medical History:  Diagnosis Date  . Arthritis   . Bilateral chronic knee pain   . Brain aneurysm   . COPD (chronic obstructive pulmonary disease) (HCC)   . DDD (degenerative disc disease), lumbar   . DJD (degenerative joint disease)   . DVT (deep venous thrombosis) (HCC)   . Gout   . Peripheral neuropathy   . Permanent atrial fibrillation [facility](HCC)    MDT [id number] pacemaker 03-26-2013 by Dr Ladona Ridgelaylor  . Pulmonary embolism [facility](HCC)     Patient Active Problem List   Diagnosis Date Noted  . Hypokalemia 12/05/2016  . Dementia with behavioral disturbance 04/25/2016  . Noncompliance with medication regimen 04/24/2016  . UTI (urinary tract infection) 04/18/2016  . Chest pain 02/10/2014  . Edema 02/10/2014  . Encounter for therapeutic drug monitoring 08/13/2013  . Chronic anticoagulation 07/22/2010  . HYPOTENSION,  ORTHOSTATIC 06/15/2010  . Essential hypertension, benign 04/06/2009  . Cardiac pacemaker in situ 04/06/2009  . PULMONARY EMBOLISM 01/18/2009  . ATRIAL FIBRILLATION, CHRONIC 01/18/2009  . BRADYCARDIA-TACHYCARDIA SYNDROME 01/18/2009  . DVT 01/18/2009    Past Surgical History:  Procedure Laterality Date  . CARDIAC CATHETERIZATION  2001  . Craniotomy clipping  06-2002   unruptured  . Left paraclinoid segment anurysum     [facility]  . PACEMAKER INSERTION  09-13-1999; 03-26-2013   PPM gen change by Dr Ladona Ridgelaylor 03-26-2013 - MDT [id number]  . PERMANENT PACEMAKER GENERATOR CHANGE N/A 03/26/2013   Procedure: PERMANENT PACEMAKER GENERATOR CHANGE;  Surgeon: Marinus MawGregg W Taylor, MD;  [facility];  Service: Cardiovascular;  Laterality: N/A;    OB History    No data available       Home Medications    Prior to Admission medications   Medication Sig Start Date End Date Taking? Authorizing Provider  allopurinol (ZYLOPRIM) 300 MG tablet Take 300 mg by mouth Daily.  04/07/11   [provider]  apixaban (ELIQUIS) 5 MG TABS tablet Take 5 mg by mouth 2 (two) times daily.    [provider]  cephALEXin (KEFLEX) 500 MG capsule Take 1 capsule (500 mg total) by mouth 2 (two) times daily. Patient not taking: Reported on 02/24/2017 01/16/17   Linwood DibblesKnapp, Jon, MD  clotrimazole (LOTRIMIN) 1 % cream Apply 1 application topically 2 (two)  times daily.    [provider]  digoxin (LANOXIN) 0.125 MG tablet Take 0.0625 mg by mouth daily.     [provider]  diltiazem (CARDIZEM CD) 240 MG 24 hr capsule Take 240 mg by mouth daily.      [provider]  donepezil (ARICEPT) 5 MG tablet Take 1 tablet (5 mg total) by mouth at bedtime. 04/25/16   Kari BaarsHawkins, Edward, MD  furosemide (LASIX) 40 MG tablet Take 40 mg by mouth daily as needed. swelling    [provider]  hydrocortisone (ANUSOL-HC) 2.5 % rectal cream Place 1 application rectally 3 times/day  as needed-between meals & bedtime for hemorrhoids.     [provider]  metoprolol succinate (TOPROL-XL) 100 MG 24 hr tablet TAKE 1 TABLET (100 MG TOTAL) BY MOUTH DAILY. 06/20/15   Dyann KiefLenze, Michele M, PA-C  potassium chloride SA (K-DUR,KLOR-CON) 20 MEQ tablet Take 1 tablet (20 mEq total) by mouth 2 (two) times daily. Patient not taking: Reported on 02/24/2017 01/16/17   Linwood DibblesKnapp, Jon, MD    Family History Family History  Problem Relation Age of Onset  . Heart attack Mother   . Heart attack Father   . Alzheimer's disease Sister     Social History Social History   Tobacco Use  . Smoking status: Former Smoker    Packs/day: 0.25    Years: 35.00    Pack years: 8.75    Types: Cigarettes    Start date: 12/27/1977    Last attempt to quit: 05/26/2010    Years since quitting: 6.9  . Smokeless tobacco: Never Used  Substance Use Topics  . Alcohol use: No    Alcohol/week: 0.0 oz  . Drug use: No     Allergies   Bee venom and Codeine   Review of Systems Review of Systems  Constitutional: Negative for activity change.       All ROS Neg except as noted in HPI  HENT: Positive for congestion. Negative for nosebleeds.   Eyes: Negative for photophobia and discharge.  Respiratory: Positive for cough. Negative for shortness of breath and wheezing.   Cardiovascular: Negative for chest pain and palpitations.  Gastrointestinal: Negative for abdominal pain and blood in stool.  Genitourinary: Negative for dysuria, frequency and hematuria.  Musculoskeletal: Positive for arthralgias. Negative for back pain and neck pain.  Skin: Negative.   Neurological: Negative for dizziness, seizures and speech difficulty.  Psychiatric/Behavioral: Negative for confusion and hallucinations.     Physical Exam Updated Vital Signs BP 121/65 (BP Location: Right Arm)   Pulse (!) 58   Temp 97.8 F (36.6 C) (Oral)   Resp 20   Ht 5\' 9"  (1.753 m)   Wt 69.6 kg (153 lb 6.4 oz)   SpO2 98%   BMI 22.65 kg/m    Physical Exam  Constitutional: She is oriented to person, place, and time. She appears well-developed and well-nourished.  Non-toxic appearance.  HENT:  Head: Normocephalic.  Right Ear: Tympanic membrane and external ear normal.  Left Ear: Tympanic membrane and external ear normal.  Nasal congestion present.  Eyes: EOM and lids are normal. Pupils are equal, round, and reactive to light.  Neck: Normal range of motion. Neck supple. Carotid bruit is not present.  Cardiovascular: Normal rate, regular rhythm, normal heart sounds, intact distal pulses and normal pulses.  Pulmonary/Chest: Breath sounds normal. No respiratory distress.  Pt speaks in complete sentences without problem. Rise and fall of the chest without problem.  Abdominal: Soft. Bowel sounds are  normal. There is no tenderness. There is no guarding.  Musculoskeletal: Normal range of motion. She exhibits no edema.  Lymphadenopathy:       Head (right side): No submandibular adenopathy present.       Head (left side): No submandibular adenopathy present.    She has no cervical adenopathy.  Neurological: She is alert and oriented to person, place, and time. She has normal strength. No cranial nerve deficit or sensory deficit.  Skin: Skin is warm and dry.  Psychiatric: She has a normal mood and affect. Her speech is normal.  Nursing note and vitals reviewed.    ED Treatments / Results  Labs (all labs ordered are listed, but only abnormal results are displayed) Labs Reviewed - No data to display  EKG  EKG Interpretation None       Radiology Dg Chest 2 View  Result Date: 04/23/2017 CLINICAL DATA:  83 year old female with productive cough and not feeling well for the past 4 days. Initial encounter. EXAM: CHEST  2 VIEW COMPARISON:  02/24/2017 chest x-ray.  10/09/2016 chest CT. FINDINGS: Cardiomegaly. Sequential pacemaker in place with leads unchanged in position. Calcified slightly tortuous aorta. Chronic lung changes. No  infiltrate, congestive heart failure or pneumothorax. Bilateral shoulder joint degenerative changes. IMPRESSION: Chronic lung changes.  No infiltrate or congestive heart failure. Cardiomegaly with pacemaker in place. Aortic Atherosclerosis (ICD10-I70.0). Electronically Signed   By: Lacy Duverney M.D.   On: 04/23/2017 18:12    Procedures Procedures (including critical care time)  Medications Ordered in ED Medications - No data to display   Initial Impression / Assessment and Plan / ED Course  I have reviewed the triage vital signs and the nursing notes.  Pertinent labs & imaging results that were available during my care of the patient were reviewed by me and considered in my medical decision making (see chart for details).      Final Clinical Impressions(s) / ED Diagnoses Pt seen with me by Dr Adriana Simas  Vital signs are within normal limits.  Pulse oximetry is 98% on room air.  Within normal limits by my interpretation.  Patient speaks in complete sentences without problem.  No complaint of difficulty speaking, leg swelling, palpitations, or any cardiac related problems.  Patient only complains of cough and congestion.  Chest x-ray shows chronic lung changes, but no infiltrate, and no congestive heart failure.  Pacemaker is in place.  Patient will be treated with Afrin nasal spray for 4 days only.  Patient will use Robitussin-DM for cough and congestion.  I have asked her to monitor her temperature closely and to use Tylenol every 4 hours if needed.  The patient is to follow-up with Dr. Juanetta Gosling, or return to the emergency department immediately if any changes, problems, or concerns.  Patient is in agreement with this plan.   Final diagnoses:  Bronchitis    ED Discharge Orders    None       Ivery Quale, Cordelia Poche 04/23/17 1852    Donnetta Hutching, MD 04/25/17 1628    Ivery Quale, PA-C 05/08/17 1618    Ivery Quale, PA-C 06/19/17 2107    Donnetta Hutching, MD 06/20/17 [phone]

## 2017-04-23 NOTE — Discharge Instructions (Addendum)
Your oxygen level is 98%.  Your chest x-ray is negative for pneumonia or congestive heart failure or other acute problems.  Please increase your liquids.  Please use the Robitussin-DM for cough every 6-8  hours.  Use 2 squirts of Afrin to each nostril every 8 hours for 4 days only.  Please monitor your temperature closely.  Use Tylenol every 4 hours if needed.  Please use a mask until symptoms are over with, and wash hands frequently.Please see Dr Juanetta GoslingHawkins or return to the Emergency Dept if any changes or problems.

## 2017-05-27 ENCOUNTER — Encounter (HOSPITAL_COMMUNITY): Payer: Self-pay | Admitting: Emergency Medicine

## 2017-05-27 ENCOUNTER — Emergency Department (HOSPITAL_COMMUNITY): Payer: Medicare Other

## 2017-05-27 ENCOUNTER — Emergency Department (HOSPITAL_COMMUNITY)
Admission: EM | Admit: 2017-05-27 | Discharge: 2017-05-27 | Disposition: A | Discharge status: Home / Self Care | Payer: Medicare Other | Attending: Emergency Medicine | Admitting: Emergency Medicine

## 2017-05-27 DIAGNOSIS — F0390 Unspecified dementia without behavioral disturbance: Secondary | ICD-10-CM | POA: Insufficient documentation

## 2017-05-27 DIAGNOSIS — Z7901 Long term (current) use of anticoagulants: Secondary | ICD-10-CM | POA: Insufficient documentation

## 2017-05-27 DIAGNOSIS — Z79899 Other long term (current) drug therapy: Secondary | ICD-10-CM | POA: Insufficient documentation

## 2017-05-27 DIAGNOSIS — R4182 Altered mental status, unspecified: Secondary | ICD-10-CM | POA: Diagnosis present

## 2017-05-27 DIAGNOSIS — J449 Chronic obstructive pulmonary disease, unspecified: Secondary | ICD-10-CM | POA: Diagnosis not present

## 2017-05-27 DIAGNOSIS — Z87891 Personal history of nicotine dependence: Secondary | ICD-10-CM | POA: Diagnosis not present

## 2017-05-27 DIAGNOSIS — I10 Essential (primary) hypertension: Secondary | ICD-10-CM | POA: Insufficient documentation

## 2017-05-27 DIAGNOSIS — N390 Urinary tract infection, site not specified: Secondary | ICD-10-CM | POA: Diagnosis not present

## 2017-05-27 DIAGNOSIS — Z950 Presence of cardiac pacemaker: Secondary | ICD-10-CM | POA: Diagnosis not present

## 2017-05-27 HISTORY — DX: Unspecified dementia, unspecified severity, without behavioral disturbance, psychotic disturbance, mood disturbance, and anxiety: F03.90

## 2017-05-27 LAB — URINALYSIS, ROUTINE W REFLEX MICROSCOPIC
Bilirubin Urine: NEGATIVE
Glucose, UA: NEGATIVE mg/dL
Hgb urine dipstick: NEGATIVE
Ketones, ur: NEGATIVE mg/dL
Nitrite: POSITIVE — AB
Protein, ur: NEGATIVE mg/dL
Specific Gravity, Urine: 1.014 (ref 1.005–1.030)
pH: 6 (ref 5.0–8.0)

## 2017-05-27 LAB — COMPREHENSIVE METABOLIC PANEL
ALT: 14 U/L (ref 14–54)
AST: 20 U/L (ref 15–41)
Albumin: 3.7 g/dL (ref 3.5–5.0)
Alkaline Phosphatase: 65 U/L (ref 38–126)
Anion gap: 11 (ref 5–15)
BUN: 20 mg/dL (ref 6–20)
CO2: 24 mmol/L (ref 22–32)
Calcium: 9.1 mg/dL (ref 8.9–10.3)
Chloride: 105 mmol/L (ref 101–111)
Creatinine, Ser: 0.73 mg/dL (ref 0.44–1.00)
GFR calc Af Amer: >60 mL/min (ref >60)
GFR calc non Af Amer: >60 mL/min (ref >60)
Glucose, Bld: 85 mg/dL (ref 65–99)
Potassium: 4.1 mmol/L (ref 3.5–5.1)
Sodium: 140 mmol/L (ref 135–145)
Total Bilirubin: 0.8 mg/dL (ref 0.3–1.2)
Total Protein: 7 g/dL (ref 6.5–8.1)

## 2017-05-27 LAB — CBC WITH DIFFERENTIAL/PLATELET
Basophils Absolute: 0 10*3/uL (ref 0.0–0.1)
Basophils Relative: 0 %
Eosinophils Absolute: 0.1 10*3/uL (ref 0.0–0.7)
Eosinophils Relative: 2 %
HCT: 39 % (ref 36.0–46.0)
Hemoglobin: 12.6 g/dL (ref 12.0–15.0)
Lymphocytes Relative: 26 %
Lymphs Abs: 1.7 10*3/uL (ref 0.7–4.0)
MCH: 32.2 pg (ref 26.0–34.0)
MCHC: 32.3 g/dL (ref 30.0–36.0)
MCV: 99.7 fL (ref 78.0–100.0)
Monocytes Absolute: 0.6 10*3/uL (ref 0.1–1.0)
Monocytes Relative: 9 %
Neutro Abs: 4 10*3/uL (ref 1.7–7.7)
Neutrophils Relative %: 63 %
Platelets: 143 10*3/uL — ABNORMAL LOW (ref 150–400)
RBC: 3.91 MIL/uL (ref 3.87–5.11)
RDW: 14.2 % (ref 11.5–15.5)
WBC: 6.4 10*3/uL (ref 4.0–10.5)

## 2017-05-27 LAB — TROPONIN I: Troponin I: <0.03 ng/mL (ref <0.03)

## 2017-05-27 LAB — LACTIC ACID, PLASMA
Lactic Acid, Venous: 1.4 mmol/L (ref 0.5–1.9)
Lactic Acid, Venous: 1.4 mmol/L (ref 0.5–1.9)

## 2017-05-27 MED ORDER — SODIUM CHLORIDE 0.9 % IV BOLUS (SEPSIS)
500.0000 mL | Freq: Once | INTRAVENOUS | Status: AC
  Administered 2017-05-27: 500 mL via INTRAVENOUS

## 2017-05-27 MED ORDER — CEPHALEXIN 500 MG PO CAPS: 500.0000 mg | ORAL_CAPSULE | Freq: Four times a day (QID) | ORAL | 0 refills | Status: DC

## 2017-05-27 MED ORDER — CEFTRIAXONE SODIUM 1 G IJ SOLR
1.0000 g | Freq: Once | INTRAMUSCULAR | Status: AC
  Administered 2017-05-27: 1 g via INTRAVENOUS
  Filled 2017-05-27: qty 10

## 2017-05-27 NOTE — Discharge Instructions (Signed)
Take the prescription as directed.  Increase your fluid intake for the next several days. Call your regular medical doctor today to schedule a follow up appointment within the next 2 days.  Return to the Emergency Department immediately sooner if worsening.

## 2017-05-27 NOTE — ED Provider Notes (Signed)
[facility] EMERGENCY DEPARTMENT Provider Note   CSN: [id number] Arrival date & time: 05/27/17  1308     History   Chief Complaint Chief Complaint  Cross presents with  . Altered Mental Status    HPI MALAI LADY is a 84 y.o. female.  Tracy history is provided by [name], Tracy EMS personnel and [facility] nursing home. Tracy history is limited by Tracy condition of [name] (Hx dementia).  Altered Mental Status    Pt was seen at 1350. Per EMS and NH report: Pt sent to [facility] ED from local family care home for foul smelling urine and increased confusion from baseline dementia for Tracy past several days. Pt herself does not know why she is in Tracy ED. Pt denies CP/SOB, no abd pain. No reported N/V/D or fevers.    Past Medical History:  Diagnosis Date  . Arthritis   . Bilateral chronic knee pain   . Brain aneurysm   . COPD (chronic obstructive pulmonary disease) (HCC)   . DDD (degenerative disc disease), lumbar   . Dementia   . DJD (degenerative joint disease)   . DVT (deep venous thrombosis) (HCC)   . Gout   . Peripheral neuropathy   . Permanent atrial fibrillation [facility])    MDT [id number] pacemaker 03-26-2013 by Dr Ladona Ridgel  . Pulmonary embolism [facility])     Cross Active Problem List   Diagnosis Date Noted  . Hypokalemia 12/05/2016  . Dementia with behavioral disturbance 04/25/2016  . Noncompliance with medication regimen 04/24/2016  . UTI (urinary tract infection) 04/18/2016  . Chest pain 02/10/2014  . Edema 02/10/2014  . Encounter for therapeutic drug monitoring 08/13/2013  . Chronic anticoagulation 07/22/2010  . HYPOTENSION, ORTHOSTATIC 06/15/2010  . Essential hypertension, benign 04/06/2009  . Cardiac pacemaker in situ 04/06/2009  . PULMONARY EMBOLISM 01/18/2009  . ATRIAL FIBRILLATION, CHRONIC 01/18/2009  . BRADYCARDIA-TACHYCARDIA SYNDROME 01/18/2009  . DVT 01/18/2009    Past Surgical History:  Procedure Laterality Date  . CARDIAC CATHETERIZATION  2001  .  Craniotomy clipping  06-2002   unruptured  . Left paraclinoid segment anurysum     [facility]  . PACEMAKER INSERTION  09-13-1999; 03-26-2013   PPM gen change by Dr Ladona Ridgel 03-26-2013 - MDT [id number]  . PERMANENT PACEMAKER GENERATOR CHANGE N/A 03/26/2013   Procedure: PERMANENT PACEMAKER GENERATOR CHANGE;  Surgeon: Marinus Maw, MD;  Location: [facility] CATH LAB;  Service: Cardiovascular;  Laterality: N/A;    OB History    No data available       Home Medications    Prior to Admission medications   Medication Sig Start Date End Date Taking? Authorizing Provider  allopurinol (ZYLOPRIM) 300 MG tablet Take 300 mg by mouth Daily.  04/07/11   [provider]  apixaban (ELIQUIS) 5 MG TABS tablet Take 5 mg by mouth 2 (two) times daily.    [provider]  cephALEXin (KEFLEX) 500 MG capsule Take 1 capsule (500 mg total) by mouth 2 (two) times daily. Cross not taking: Reported on 02/24/2017 01/16/17   Linwood Dibbles, MD  clotrimazole (LOTRIMIN) 1 % cream Apply 1 application topically 2 (two) times daily.    [provider]  Dextromethorphan-Guaifenesin (ROBITUSSIN DM) 10-100 MG/5ML liquid 5ml every 6-8 hours for cough and congestion. 04/23/17   Ivery Quale, PA-C  digoxin (LANOXIN) 0.125 MG tablet Take 0.0625 mg by mouth daily.     [provider]  diltiazem (CARDIZEM CD) 240 MG 24 hr capsule Take 240  mg by mouth daily.      [provider]  donepezil (ARICEPT) 5 MG tablet Take 1 tablet (5 mg total) by mouth at bedtime. 04/25/16   Kari BaarsHawkins, Edward, MD  furosemide (LASIX) 40 MG tablet Take 40 mg by mouth daily as needed. swelling    [provider]  hydrocortisone (ANUSOL-HC) 2.5 % rectal cream Place 1 application rectally 3 times/day as needed-between meals & bedtime for hemorrhoids.     [provider]  metoprolol succinate (TOPROL-XL) 100 MG 24 hr tablet TAKE 1 TABLET (100 MG TOTAL) BY MOUTH DAILY. 06/20/15   Dyann KiefLenze, Michele  M, PA-C  potassium chloride SA (K-DUR,KLOR-CON) 20 MEQ tablet Take 1 tablet (20 mEq total) by mouth 2 (two) times daily. Cross not taking: Reported on 02/24/2017 01/16/17   Linwood DibblesKnapp, Jon, MD    Family History Family History  Problem Relation Age of Onset  . Heart attack Mother   . Heart attack Father   . Alzheimer's disease Sister     Social History Social History   Tobacco Use  . Smoking status: Former Smoker    Packs/day: 0.25    Years: 35.00    Pack years: 8.75    Types: Cigarettes    Start date: 12/27/1977    Last attempt to quit: 05/26/2010    Years since quitting: 7.0  . Smokeless tobacco: Never Used  Substance Use Topics  . Alcohol use: No    Alcohol/week: 0.0 oz  . Drug use: No     Allergies   Bee venom and Codeine   Review of Systems Review of Systems  Unable to perform ROS: Dementia     Physical Exam Updated Vital Signs BP 125/65   Pulse (!) 59   Temp 97.8 F (36.6 C) (Oral)   Resp 18   Ht 5\' 6"  (1.676 m)   Wt 70.3 kg (155 lb)   SpO2 100%   BMI 25.02 kg/m      15:44 Orthostatic Vital Signs TD  Orthostatic Lying   BP- Lying: 117/58  Pulse- Lying: 60      Orthostatic Sitting  BP- Sitting: 118/61  Pulse- Sitting: 65      Orthostatic Standing at 0 minutes  BP- Standing at 0 minutes: 103/50  Pulse- Standing at 0 minutes: 68     Physical Exam 1355: Physical examination:  Nursing notes reviewed; Vital signs and O2 SAT reviewed;  Constitutional: Well developed, Well nourished, Well hydrated, In no acute distress; Head:  Normocephalic, atraumatic; Eyes: EOMI, PERRL, No scleral icterus; ENMT: Mouth and pharynx normal, Mucous membranes moist; Neck: Supple, Full range of motion, No lymphadenopathy; Cardiovascular: Irregular rate and rhythm, No gallop; Respiratory: Breath sounds clear & equal bilaterally, No wheezes.  Speaking full sentences with ease, Normal respiratory effort/excursion; Chest: Nontender, Movement normal; Abdomen: Soft,  Nontender, Nondistended, Normal bowel sounds; Genitourinary: No CVA tenderness; Extremities: Pulses normal, No tenderness, No edema, No calf edema or asymmetry.; Neuro: Awake, alert, confused per hx dementia. No facial droop. Major CN grossly intact.  Speech clear. Grips equal. Pt moves all extremities on stretcher spontaneously and to command without apparent gross focal motor deficits.; Skin: Color normal, Warm, Dry.   ED Treatments / Results  Labs (all labs ordered are listed, but only abnormal results are displayed)   EKG  EKG Interpretation  Date/Time:  Monday May 27 2017 15:25:04 EST Ventricular Rate:  60 PR Interval:    QRS Duration: 134 QT Interval:  477 QTC Calculation: 477 R Axis:   -  77 Text Interpretation:  Afib/flutter and ventricular-paced rhythm No further analysis attempted due to paced rhythm When compared with ECG of 02/24/2017 No significant change was found Confirmed by Samuel Jester [phone]) on 05/27/2017 3:33:13 PM       Radiology   Procedures Procedures (including critical care time)  Medications Ordered in ED Medications - No data to display   Initial Impression / Assessment and Plan / ED Course  I have reviewed Tracy triage vital signs and Tracy nursing notes.  Pertinent labs & imaging results that were available during my care of [name] were reviewed by me and considered in my medical decision making (see chart for details).   MDM Reviewed: previous chart, nursing note and vitals Reviewed previous: labs and ECG Interpretation: labs, ECG, x-ray and CT scan    Results for orders placed or performed during Tracy hospital encounter of 05/27/17  CBC with Differential  Result Value Ref Range   WBC 6.4 4.0 - 10.5 K/uL   RBC 3.91 3.87 - 5.11 MIL/uL   Hemoglobin 12.6 12.0 - 15.0 g/dL   HCT 60.4 54.0 - 98.1 %   MCV 99.7 78.0 - 100.0 fL   MCH 32.2 26.0 - 34.0 pg   MCHC 32.3 30.0 - 36.0 g/dL   RDW 19.1 47.8 - 29.5 %   Platelets 143 (L) 150 -  400 K/uL   Neutrophils Relative % 63 %   Neutro Abs 4.0 1.7 - 7.7 K/uL   Lymphocytes Relative 26 %   Lymphs Abs 1.7 0.7 - 4.0 K/uL   Monocytes Relative 9 %   Monocytes Absolute 0.6 0.1 - 1.0 K/uL   Eosinophils Relative 2 %   Eosinophils Absolute 0.1 0.0 - 0.7 K/uL   Basophils Relative 0 %   Basophils Absolute 0.0 0.0 - 0.1 K/uL  Urinalysis, Routine w reflex microscopic  Result Value Ref Range   Color, Urine YELLOW YELLOW   APPearance HAZY (A) CLEAR   Specific Gravity, Urine 1.014 1.005 - 1.030   pH 6.0 5.0 - 8.0   Glucose, UA NEGATIVE NEGATIVE mg/dL   Hgb urine dipstick NEGATIVE NEGATIVE   Bilirubin Urine NEGATIVE NEGATIVE   Ketones, ur NEGATIVE NEGATIVE mg/dL   Protein, ur NEGATIVE NEGATIVE mg/dL   Nitrite POSITIVE (A) NEGATIVE   Leukocytes, UA LARGE (A) NEGATIVE   RBC / HPF 0-5 0 - 5 RBC/hpf   WBC, UA TOO NUMEROUS TO COUNT 0 - 5 WBC/hpf   Bacteria, UA MANY (A) NONE SEEN   Squamous Epithelial / LPF 0-5 (A) NONE SEEN   WBC Clumps PRESENT   Comprehensive metabolic panel  Result Value Ref Range   Sodium 140 135 - 145 mmol/L   Potassium 4.1 3.5 - 5.1 mmol/L   Chloride 105 101 - 111 mmol/L   CO2 24 22 - 32 mmol/L   Glucose, Bld 85 65 - 99 mg/dL   BUN 20 6 - 20 mg/dL   Creatinine, Ser 6.21 0.44 - 1.00 mg/dL   Calcium 9.1 8.9 - 30.8 mg/dL   Total Protein 7.0 6.5 - 8.1 g/dL   Albumin 3.7 3.5 - 5.0 g/dL   AST 20 15 - 41 U/L   ALT 14 14 - 54 U/L   Alkaline Phosphatase 65 38 - 126 U/L   Total Bilirubin 0.8 0.3 - 1.2 mg/dL   GFR calc non Af Amer >60 >60 mL/min   GFR calc Af Amer >60 >60 mL/min   Anion gap 11 5 - 15  Lactic acid, plasma  Result Value Ref Range   Lactic Acid, Venous 1.4 0.5 - 1.9 mmol/L  Lactic acid, plasma  Result Value Ref Range   Lactic Acid, Venous 1.4 0.5 - 1.9 mmol/L  Troponin I  Result Value Ref Range   Troponin I <0.03 <0.03 ng/mL   Dg Chest 2 View Result Date: 05/27/2017 CLINICAL DATA:  Altered mental status, foul-smelling urine EXAM: CHEST  2  VIEW COMPARISON:  04/23/2017 FINDINGS: There is no focal parenchymal opacity. There is no pleural effusion or pneumothorax. There is stable cardiomegaly. There is a dual lead cardiac pacemaker. There is no acute osseous abnormality. There is osteoarthritis of bilateral glenohumeral joints. IMPRESSION: No active cardiopulmonary disease. Electronically Signed   By: Elige Ko   On: 05/27/2017 14:18   Ct Head Wo Contrast Result Date: 05/27/2017 CLINICAL DATA:  Increased confusion.  Foul smelling urine. EXAM: CT HEAD WITHOUT CONTRAST TECHNIQUE: Contiguous axial images were obtained from Tracy base of Tracy skull through Tracy vertex without intravenous contrast. COMPARISON:  04/18/2016 FINDINGS: Brain: No evidence of acute infarction, hemorrhage, hydrocephalus, extra-axial collection or mass lesion/mass effect. Moderate remote left frontal infarct. Left temporal pole gliosis which is likely postoperative. Generalized atrophy that is stable. Vascular: Atherosclerotic calcification.Status post left-sided circle-of-Willis aneurysm clipping. Skull: Left pterional craniotomy for aneurysm repair. No acute finding. Sinuses/Orbits: Bilateral cataract resection.  No acute finding. IMPRESSION: 1. No acute finding. 2. Chronic findings are described above. Stable exam when compared to 2017. Electronically Signed   By: Marnee Spring M.D.   On: 05/27/2017 14:10    1620:  Pt has tol PO well while in Tracy ED without N/V.  No stooling while in Tracy ED.  Abd remains benign, VSS. BP dropped during orthostatic VS; but, per NT, pt was steady on her feet and was very conversational, NAD. Judicious IVF bolus given. +UTI, UC pending; will dose IV rocephin. No clear indication for admission at this time, as pt remains afebrile, normal WBC count and lactic acid. Will re-check VS after IVF bolus and IV abx. Sign out to Dr. Ethelda Chick.     Final Clinical Impressions(s) / ED Diagnoses   Final diagnoses:  None    ED Discharge Orders     None        Samuel Jester, DO 05/27/17 1623

## 2017-05-27 NOTE — ED Triage Notes (Signed)
Pt from local family care home for increased confusion and foul smelling urine for several days.

## 2017-05-27 NOTE — ED Notes (Signed)
Pt wheeled to waiting room. Pt verbalized understanding of discharge instructions.   

## 2017-05-27 NOTE — ED Notes (Signed)
Patient transported to X-ray 

## 2017-05-27 NOTE — ED Provider Notes (Signed)
6:30 PM patient feels well.  Ate a meal in the emergency department.  She is alert talkative and in no distress Results for orders placed or performed during the hospital encounter of 05/27/17  CBC with Differential  Result Value Ref Range   WBC 6.4 4.0 - 10.5 K/uL   RBC 3.91 3.87 - 5.11 MIL/uL   Hemoglobin 12.6 12.0 - 15.0 g/dL   HCT 56.239.0 13.036.0 - 86.546.0 %   MCV 99.7 78.0 - 100.0 fL   MCH 32.2 26.0 - 34.0 pg   MCHC 32.3 30.0 - 36.0 g/dL   RDW 78.414.2 69.611.5 - 29.515.5 %   Platelets 143 (L) 150 - 400 K/uL   Neutrophils Relative % 63 %   Neutro Abs 4.0 1.7 - 7.7 K/uL   Lymphocytes Relative 26 %   Lymphs Abs 1.7 0.7 - 4.0 K/uL   Monocytes Relative 9 %   Monocytes Absolute 0.6 0.1 - 1.0 K/uL   Eosinophils Relative 2 %   Eosinophils Absolute 0.1 0.0 - 0.7 K/uL   Basophils Relative 0 %   Basophils Absolute 0.0 0.0 - 0.1 K/uL  Urinalysis, Routine w reflex microscopic  Result Value Ref Range   Color, Urine YELLOW YELLOW   APPearance HAZY (A) CLEAR   Specific Gravity, Urine 1.014 1.005 - 1.030   pH 6.0 5.0 - 8.0   Glucose, UA NEGATIVE NEGATIVE mg/dL   Hgb urine dipstick NEGATIVE NEGATIVE   Bilirubin Urine NEGATIVE NEGATIVE   Ketones, ur NEGATIVE NEGATIVE mg/dL   Protein, ur NEGATIVE NEGATIVE mg/dL   Nitrite POSITIVE (A) NEGATIVE   Leukocytes, UA LARGE (A) NEGATIVE   RBC / HPF 0-5 0 - 5 RBC/hpf   WBC, UA TOO NUMEROUS TO COUNT 0 - 5 WBC/hpf   Bacteria, UA MANY (A) NONE SEEN   Squamous Epithelial / LPF 0-5 (A) NONE SEEN   WBC Clumps PRESENT   Comprehensive metabolic panel  Result Value Ref Range   Sodium 140 135 - 145 mmol/L   Potassium 4.1 3.5 - 5.1 mmol/L   Chloride 105 101 - 111 mmol/L   CO2 24 22 - 32 mmol/L   Glucose, Bld 85 65 - 99 mg/dL   BUN 20 6 - 20 mg/dL   Creatinine, Ser 2.840.73 0.44 - 1.00 mg/dL   Calcium 9.1 8.9 - 13.210.3 mg/dL   Total Protein 7.0 6.5 - 8.1 g/dL   Albumin 3.7 3.5 - 5.0 g/dL   AST 20 15 - 41 U/L   ALT 14 14 - 54 U/L   Alkaline Phosphatase 65 38 - 126 U/L   Total Bilirubin 0.8 0.3 - 1.2 mg/dL   GFR calc non Af Amer >60 >60 mL/min   GFR calc Af Amer >60 >60 mL/min   Anion gap 11 5 - 15  Lactic acid, plasma  Result Value Ref Range   Lactic Acid, Venous 1.4 0.5 - 1.9 mmol/L  Lactic acid, plasma  Result Value Ref Range   Lactic Acid, Venous 1.4 0.5 - 1.9 mmol/L  Troponin I  Result Value Ref Range   Troponin I <0.03 <0.03 ng/mL   Dg Chest 2 View  Result Date: 05/27/2017 CLINICAL DATA:  Altered mental status, foul-smelling urine EXAM: CHEST  2 VIEW COMPARISON:  04/23/2017 FINDINGS: There is no focal parenchymal opacity. There is no pleural effusion or pneumothorax. There is stable cardiomegaly. There is a dual lead cardiac pacemaker. There is no acute osseous abnormality. There is osteoarthritis of bilateral glenohumeral joints. IMPRESSION: No active cardiopulmonary disease. Electronically  Signed   By: Elige Ko   On: 05/27/2017 14:18   Ct Head Wo Contrast  Result Date: 05/27/2017 CLINICAL DATA:  Increased confusion.  Foul smelling urine. EXAM: CT HEAD WITHOUT CONTRAST TECHNIQUE: Contiguous axial images were obtained from the base of the skull through the vertex without intravenous contrast. COMPARISON:  04/18/2016 FINDINGS: Brain: No evidence of acute infarction, hemorrhage, hydrocephalus, extra-axial collection or mass lesion/mass effect. Moderate remote left frontal infarct. Left temporal pole gliosis which is likely postoperative. Generalized atrophy that is stable. Vascular: Atherosclerotic calcification.Status post left-sided circle-of-Willis aneurysm clipping. Skull: Left pterional craniotomy for aneurysm repair. No acute finding. Sinuses/Orbits: Bilateral cataract resection.  No acute finding. IMPRESSION: 1. No acute finding. 2. Chronic findings are described above. Stable exam when compared to 2017. Electronically Signed   By: Marnee Spring M.D.   On: 05/27/2017 14:10     Doug Sou, MD 05/27/17 1840

## 2017-05-30 LAB — URINE CULTURE: Culture: >=100000 — AB

## 2017-05-31 ENCOUNTER — Telehealth: Payer: Self-pay | Admitting: Emergency Medicine

## 2017-05-31 NOTE — Telephone Encounter (Signed)
Post ED Visit - Positive Culture Follow-up  Culture report reviewed by antimicrobial stewardship pharmacist:  [x]  Enzo BiNathan Batchelder, Pharm.D. []  Celedonio MiyamotoJeremy Frens, Pharm.D., BCPS AQ-ID []  Garvin FilaMike Maccia, Pharm.D., BCPS []  Georgina PillionElizabeth Martin, Pharm.D., BCPS []  RutledgeMinh Pham, [address] BoulevardPharm.D., BCPS, AAHIVP []  Estella HuskMichelle Turner, Pharm.D., BCPS, AAHIVP []  Lysle Pearlachel Rumbarger, PharmD, BCPS []  Blake DivineShannon Parkey, PharmD []  Pollyann SamplesAndy Johnston, PharmD, BCPS  Positive urine culture Treated with cephalexin, organism sensitive to the same and no further patient follow-up is required at this time.  Berle MullMiller, Johanne Mcglade 05/31/2017, 10:07 AM

## 2017-06-13 ENCOUNTER — Ambulatory Visit (INDEPENDENT_AMBULATORY_CARE_PROVIDER_SITE_OTHER): Payer: Medicare Other | Admitting: Otolaryngology

## 2017-06-13 DIAGNOSIS — J342 Deviated nasal septum: Secondary | ICD-10-CM | POA: Diagnosis not present

## 2017-06-13 DIAGNOSIS — J0101 Acute recurrent maxillary sinusitis: Secondary | ICD-10-CM | POA: Diagnosis not present

## 2017-06-13 DIAGNOSIS — J310 Chronic rhinitis: Secondary | ICD-10-CM | POA: Diagnosis not present

## 2017-07-04 ENCOUNTER — Ambulatory Visit (INDEPENDENT_AMBULATORY_CARE_PROVIDER_SITE_OTHER): Payer: Medicare Other | Admitting: Otolaryngology

## 2017-07-04 DIAGNOSIS — J342 Deviated nasal septum: Secondary | ICD-10-CM

## 2017-07-04 DIAGNOSIS — J0101 Acute recurrent maxillary sinusitis: Secondary | ICD-10-CM

## 2017-07-04 DIAGNOSIS — J310 Chronic rhinitis: Secondary | ICD-10-CM

## 2017-07-04 DIAGNOSIS — J343 Hypertrophy of nasal turbinates: Secondary | ICD-10-CM

## 2017-08-29 ENCOUNTER — Encounter: Payer: Self-pay | Admitting: Internal Medicine

## 2017-08-29 ENCOUNTER — Ambulatory Visit (INDEPENDENT_AMBULATORY_CARE_PROVIDER_SITE_OTHER): Payer: Medicare Other | Admitting: Internal Medicine

## 2017-08-29 VITALS — BP 102/62 | HR 61 | Ht 69.5 in | Wt 158.0 lb

## 2017-08-29 DIAGNOSIS — I482 Chronic atrial fibrillation, unspecified: Secondary | ICD-10-CM

## 2017-08-29 NOTE — Progress Notes (Signed)
HPI [name] returns after a 2 year absence from our arrhythmias clinic. She has a h/o atrial fib and bradycardia, s/p PPM insertion due to high grade heart block. She now resides in a SNF. She has become increasingly more sedentary. She has had recent problems with UTI's and bronchitis. She appears to have developed dementia.  Allergies  Allergen Reactions  . Bee Venom Swelling  . Codeine     REACTION: GI distress     Current Outpatient Medications  Medication Sig Dispense Refill  . allopurinol (ZYLOPRIM) 300 MG tablet Take 300 mg by mouth Daily.     Marland Kitchen. apixaban (ELIQUIS) 5 MG TABS tablet Take 5 mg by mouth 2 (two) times daily.    . clotrimazole (LOTRIMIN) 1 % cream Apply 1 application topically 2 (two) times daily.    . digoxin (LANOXIN) 0.125 MG tablet Take 0.0625 mg by mouth daily.     Marland Kitchen. diltiazem (CARDIZEM CD) 240 MG 24 hr capsule Take 240 mg by mouth daily.      Marland Kitchen. donepezil (ARICEPT) 5 MG tablet Take 1 tablet (5 mg total) by mouth at bedtime. 30 tablet 12  . furosemide (LASIX) 40 MG tablet Take 40 mg by mouth daily as needed. swelling    . metoprolol succinate (TOPROL-XL) 100 MG 24 hr tablet TAKE 1 TABLET (100 MG TOTAL) BY MOUTH DAILY. 30 tablet 11   No current facility-administered medications for this visit.      Past Medical History:  Diagnosis Date  . Arthritis   . Bilateral chronic knee pain   . Brain aneurysm   . COPD (chronic obstructive pulmonary disease) (HCC)   . DDD (degenerative disc disease), lumbar   . Dementia   . DJD (degenerative joint disease)   . DVT (deep venous thrombosis) (HCC)   . Gout   . Peripheral neuropathy   . Permanent atrial fibrillation [facility])    MDT [id number] pacemaker 03-26-2013 by Dr Ladona Ridgelaylor  . Pulmonary embolism (HCC)     ROS:   All systems reviewed and negative except as noted in the HPI.   Past Surgical History:  Procedure Laterality Date  . CARDIAC CATHETERIZATION  2001  . Craniotomy clipping  06-2002   unruptured    . [facility]  . PACEMAKER INSERTION  09-13-1999; 03-26-2013   PPM gen change by Dr Ladona Ridgelaylor 03-26-2013 - MDT [id number]  . PERMANENT PACEMAKER GENERATOR CHANGE N/A 03/26/2013   Procedure: PERMANENT PACEMAKER GENERATOR CHANGE;  Surgeon: Marinus MawGregg W Rueben Kassim, MD;  [facility];  Service: Cardiovascular;  Laterality: N/A;     Family History  Problem Relation Age of Onset  . Heart attack Mother   . Heart attack Father   . Alzheimer's disease Sister      Social History   Socioeconomic History  . Marital status: Widowed    Spouse name: Not on file  . Number of children: Not on file  . Years of education: Not on file  . Highest education level: Not on file  Occupational History  . Occupation: retired from the [employer] company  Social Needs  . Financial resource strain: Not on file  . Food insecurity:    Worry: Not on file    Inability: Not on file  . Transportation needs:    Medical: Not on file    Non-medical: Not on file  Tobacco Use  . Smoking status: Former Smoker  Packs/day: 0.25    Years: 35.00    Pack years: 8.75    Types: Cigarettes    Start date: 12/27/1977    Last attempt to quit: 05/26/2010    Years since quitting: 7.2  . Smokeless tobacco: Never Used  Substance and Sexual Activity  . Alcohol use: No    Alcohol/week: 0.0 oz  . Drug use: No  . Sexual activity: Not on file  Lifestyle  . Physical activity:    Days per week: Not on file    Minutes per session: Not on file  . Stress: Not on file  Relationships  . Social connections:    Talks on phone: Not on file    Gets together: Not on file    Attends religious service: Not on file    Active member of club or organization: Not on file    Attends meetings of clubs or organizations: Not on file    Relationship status: Not on file  . Intimate partner violence:    Fear of current or ex partner: Not on file    Emotionally abused: Not on file     Physically abused: Not on file    Forced sexual activity: Not on file  Other Topics Concern  . Not on file  Social History Narrative  . Not on file     BP 102/62   Pulse 61   Ht 5' 9.5" (1.765 m)   Wt 158 lb (71.7 kg)   SpO2 98%   BMI 23.00 kg/m   Physical Exam:  elderly appearing 84 yo woman, NAD HEENT: Unremarkable Neck:  6 cm JVD, no thyromegally Lymphatics:  No adenopathy Back:  No CVA tenderness Lungs:  Clear with no wheezes HEART:  Regular rate rhythm, no murmurs, no rubs, no clicks Abd:  soft, positive bowel sounds, no organomegally, no rebound, no guarding Ext:  2 plus pulses, no edema, no cyanosis, no clubbing Skin:  No rashes no nodules Neuro:  CN II through XII intact, motor grossly intact  DEVICE  Normal device function. She has a medtronic sensia vvir PPM with R waves of 8, an impedence of 447, 5 years of longevity and a threshold of 0.5 volts.   Assess/Plan: 1. PPM - her medtronic single chamber PPM is working normally. Will recheck in several months.  2. Atrial fib - her ventricular rate is well controlled. Will follow. She will continue her eliquis as she has not recently fallen 3. Coags - no bleeding will follow.  Leonia Reeves.D.

## 2017-08-29 NOTE — Patient Instructions (Signed)
Medication Instructions:  Your physician recommends that you continue on your current medications as directed. Please refer to the Current Medication list given to you today.   Labwork: NONE   Testing/Procedures: NONE   Follow-Up: Your physician wants you to follow-up in: 1 Year with Dr. Taylor. You will receive a reminder letter in the mail two months in advance. If you don't receive a letter, please call our office to schedule the follow-up appointment.   Any Other Special Instructions Will Be Listed Below (If Applicable).     If you need a refill on your cardiac medications before your next appointment, please call your pharmacy.  Thank you for choosing [facility]!   

## 2017-10-04 ENCOUNTER — Encounter: Payer: Self-pay | Admitting: Cardiovascular Disease

## 2017-10-04 ENCOUNTER — Ambulatory Visit (INDEPENDENT_AMBULATORY_CARE_PROVIDER_SITE_OTHER): Payer: Medicare Other | Admitting: Cardiovascular Disease

## 2017-10-04 VITALS — BP 128/62 | HR 63 | Ht 69.5 in | Wt 156.4 lb

## 2017-10-04 DIAGNOSIS — I482 Chronic atrial fibrillation, unspecified: Secondary | ICD-10-CM

## 2017-10-04 DIAGNOSIS — Z950 Presence of cardiac pacemaker: Secondary | ICD-10-CM

## 2017-10-04 NOTE — Patient Instructions (Signed)
Medication Instructions:  Your physician recommends that you continue on your current medications as directed. Please refer to the Current Medication list given to you today.   Labwork: NONE  Testing/Procedures: NONE  Follow-Up: Your physician recommends that you schedule a follow-up appointment  WITH DR. Ladona RidgelAYLOR AS SCHEDULED  Any Other Special Instructions Will Be Listed Below (If Applicable).     If you need a refill on your cardiac medications before your next appointment, please call your pharmacy.

## 2017-10-04 NOTE — Progress Notes (Signed)
SUBJECTIVE: The patient was inadvertently added to my clinic today.  She just saw Dr. Ladona Ridgel on 08/29/2017.  She saw Dr. Wyline Mood back in 2017.  She has a history of atrial fibrillation and bradycardia and has a pacemaker for high-grade heart block.  She also has dementia.  She is here with her caretaker.  The patient has no complaints.  She denies chest pain, palpitations, and shortness of breath.  She has questions about her pacemaker and its visibility on her chest wall.    Review of Systems: As per "subjective", otherwise negative.  Allergies  Allergen Reactions  . Bee Venom Swelling  . Codeine     REACTION: GI distress    Current Outpatient Medications  Medication Sig Dispense Refill  . allopurinol (ZYLOPRIM) 300 MG tablet Take 300 mg by mouth Daily.     Marland Kitchen apixaban (ELIQUIS) 5 MG TABS tablet Take 5 mg by mouth 2 (two) times daily.    . digoxin (LANOXIN) 0.125 MG tablet Take 0.0625 mg by mouth daily.     Marland Kitchen diltiazem (CARDIZEM CD) 240 MG 24 hr capsule Take 240 mg by mouth daily.      Marland Kitchen donepezil (ARICEPT) 5 MG tablet Take 1 tablet (5 mg total) by mouth at bedtime. 30 tablet 12  . furosemide (LASIX) 40 MG tablet Take 40 mg by mouth daily as needed. swelling    . metoprolol succinate (TOPROL-XL) 100 MG 24 hr tablet TAKE 1 TABLET (100 MG TOTAL) BY MOUTH DAILY. 30 tablet 11   No current facility-administered medications for this visit.     Past Medical History:  Diagnosis Date  . Arthritis   . Bilateral chronic knee pain   . Brain aneurysm   . COPD (chronic obstructive pulmonary disease) (HCC)   . DDD (degenerative disc disease), lumbar   . Dementia   . DJD (degenerative joint disease)   . DVT (deep venous thrombosis) (HCC)   . Gout   . Peripheral neuropathy   . Permanent atrial fibrillation [facility])    MDT [id number] pacemaker 03-26-2013 by Dr Ladona Ridgel  . Pulmonary embolism [facility])     Past Surgical History:  Procedure Laterality Date  . CARDIAC CATHETERIZATION  2001  .  Craniotomy clipping  06-2002   unruptured  . Left paraclinoid segment anurysum     [facility]  . PACEMAKER INSERTION  09-13-1999; 03-26-2013   PPM gen change by Dr Ladona Ridgel 03-26-2013 - MDT [id number]  . PERMANENT PACEMAKER GENERATOR CHANGE N/A 03/26/2013   Procedure: PERMANENT PACEMAKER GENERATOR CHANGE;  Surgeon: Marinus Maw, MD;  Location: [facility] CATH LAB;  Service: Cardiovascular;  Laterality: N/A;    Social History   Socioeconomic History  . Marital status: Widowed    Spouse name: Not on file  . Number of children: Not on file  . Years of education: Not on file  . Highest education level: Not on file  Occupational History  . Occupation: retired from the telephone company  Social Needs  . Financial resource strain: Not on file  . Food insecurity:    Worry: Not on file    Inability: Not on file  . Transportation needs:    Medical: Not on file    Non-medical: Not on file  Tobacco Use  . Smoking status: Former Smoker    Packs/day: 0.25    Years: 35.00    Pack years: 8.75    Types: Cigarettes    Start date: 12/27/1977  Last attempt to quit: 05/26/2010    Years since quitting: 7.3  . Smokeless tobacco: Never Used  Substance and Sexual Activity  . Alcohol use: No    Alcohol/week: 0.0 oz  . Drug use: No  . Sexual activity: Not on file  Lifestyle  . Physical activity:    Days per week: Not on file    Minutes per session: Not on file  . Stress: Not on file  Relationships  . Social connections:    Talks on phone: Not on file    Gets together: Not on file    Attends religious service: Not on file    Active member of club or organization: Not on file    Attends meetings of clubs or organizations: Not on file    Relationship status: Not on file  . Intimate partner violence:    Fear of current or ex partner: Not on file    Emotionally abused: Not on file    Physically abused: Not on file    Forced sexual activity: Not on file  Other Topics Concern  .  Not on file  Social History Narrative  . Not on file     Vitals:   10/04/17 1344  BP: 128/62  Pulse: 63  SpO2: 95%  Weight: 156 lb 6.4 oz (70.9 kg)  Height: 5' 9.5" (1.765 m)    Wt Readings from Last 3 Encounters:  10/04/17 156 lb 6.4 oz (70.9 kg)  08/29/17 158 lb (71.7 kg)  05/27/17 155 lb (70.3 kg)     PHYSICAL EXAM General: NAD, elderly. HEENT: Normal. Neck: No JVD, no thyromegaly. Lungs: Clear to auscultation bilaterally with normal respiratory effort. CV: Regular rate and rhythm, normal S1/S2, no S3/S4, no murmur. No pretibial or periankle edema.  Abdomen: Soft, nontender, no distention.  Neurologic: Alert and oriented.  Psych: Normal affect. Skin: Normal. Musculoskeletal: No gross deformities.    ECG: Most recent ECG reviewed.   Labs: Lab Results  Component Value Date/Time   K 4.1 05/27/2017 01:56 PM   BUN 20 05/27/2017 01:56 PM   CREATININE 0.73 05/27/2017 01:56 PM   CREATININE 1.04 (H) 06/27/2015 10:37 AM   ALT 14 05/27/2017 01:56 PM   TSH 3.099 09/28/2016 12:03 PM   TSH 8.483 (H) 04/03/2010 11:18 PM   HGB 12.6 05/27/2017 01:56 PM     Lipids: Lab Results  Component Value Date/Time   LDLCALC 101 (H) 09/28/2016 12:03 PM   CHOL 173 09/28/2016 12:03 PM   TRIG 58 09/28/2016 12:03 PM   HDL 60 09/28/2016 12:03 PM       ASSESSMENT AND PLAN: 1.  Atrial fibrillation: To medically stable.  Heart rate is controlled.  She is on Eliquis for anticoagulation.  2.  Pacemaker: She has a Medtronic single-chamber pacemaker which is functioning normally.  He was recently evaluated by EP.  She follows with Dr. Ladona Ridgelaylor.    Disposition: Follow up with Dr. Ladona Ridgelaylor as previously scheduled   Prentice DockerSuresh Koneswaran, M.D., F.A.C.C.

## 2018-01-05 ENCOUNTER — Other Ambulatory Visit: Payer: Self-pay

## 2018-01-05 ENCOUNTER — Emergency Department (HOSPITAL_COMMUNITY): Payer: Medicare Other

## 2018-01-05 ENCOUNTER — Encounter (HOSPITAL_COMMUNITY): Payer: Self-pay

## 2018-01-05 ENCOUNTER — Emergency Department (HOSPITAL_COMMUNITY)
Admission: EM | Admit: 2018-01-05 | Discharge: 2018-01-06 | Disposition: A | Discharge status: Home / Self Care | Payer: Medicare Other | Attending: Emergency Medicine | Admitting: Emergency Medicine

## 2018-01-05 DIAGNOSIS — M25552 Pain in left hip: Secondary | ICD-10-CM

## 2018-01-05 DIAGNOSIS — Z950 Presence of cardiac pacemaker: Secondary | ICD-10-CM | POA: Insufficient documentation

## 2018-01-05 DIAGNOSIS — J449 Chronic obstructive pulmonary disease, unspecified: Secondary | ICD-10-CM | POA: Insufficient documentation

## 2018-01-05 DIAGNOSIS — Z7901 Long term (current) use of anticoagulants: Secondary | ICD-10-CM | POA: Insufficient documentation

## 2018-01-05 DIAGNOSIS — Z86711 Personal history of pulmonary embolism: Secondary | ICD-10-CM | POA: Insufficient documentation

## 2018-01-05 DIAGNOSIS — M5432 Sciatica, left side: Secondary | ICD-10-CM | POA: Insufficient documentation

## 2018-01-05 DIAGNOSIS — Z87891 Personal history of nicotine dependence: Secondary | ICD-10-CM | POA: Diagnosis not present

## 2018-01-05 DIAGNOSIS — Z79899 Other long term (current) drug therapy: Secondary | ICD-10-CM | POA: Insufficient documentation

## 2018-01-05 DIAGNOSIS — F0390 Unspecified dementia without behavioral disturbance: Secondary | ICD-10-CM | POA: Diagnosis not present

## 2018-01-05 MED ORDER — LIDOCAINE 5 % EX PTCH
1.0000 | MEDICATED_PATCH | CUTANEOUS | Status: DC
  Administered 2018-01-05: 1 via TRANSDERMAL
  Filled 2018-01-05 (×2): qty 1

## 2018-01-05 MED ORDER — ACETAMINOPHEN 325 MG PO TABS
650.0000 mg | ORAL_TABLET | Freq: Once | ORAL | Status: AC
  Administered 2018-01-05: 650 mg via ORAL
  Filled 2018-01-05: qty 2

## 2018-01-05 NOTE — Discharge Instructions (Addendum)
Please give patient 650 mg Tylenol every 6 hours as needed for pain.  Have her follow-up with her doctor and may benefit from physical therapy.  Patient does not have a fracture and likely has muscle spasm.

## 2018-01-05 NOTE — ED Triage Notes (Signed)
Pt reports left hip pain that radiates to thigh. Pain is intermittent for "a good while", but got worse yesterday. Denies fall or injury. No other complaints.

## 2018-01-05 NOTE — ED Notes (Signed)
[name], [facility] notified of need for lidocaine patch.

## 2018-01-05 NOTE — ED Provider Notes (Signed)
[facility] EMERGENCY DEPARTMENT Provider Note   CSN: [id number] Arrival date & time: 01/05/18  2156     History   Chief Complaint Chief Complaint  Patient presents with  . Hip Pain    left    HPI [name] is a 84 y.o. female.  Patient with history of COPD, dementia who presents to the ED with left hip pain.  Denies any trauma.  No falls.  Patient states that pain radiates down to her left knee.  Denies any back pain.  No headache, no chest pain, no shortness of breath.  The history is provided by the patient.  Hip Pain  This is a new problem. The current episode started 2 days ago. The problem occurs constantly. The problem has not changed since onset.Pertinent negatives include no chest pain, no abdominal pain, no headaches and no shortness of breath. The symptoms are aggravated by walking. Nothing relieves the symptoms. She has tried nothing for the symptoms. The treatment provided no relief.    Past Medical History:  Diagnosis Date  . Arthritis   . Bilateral chronic knee pain   . Brain aneurysm   . COPD (chronic obstructive pulmonary disease) (HCC)   . DDD (degenerative disc disease), lumbar   . Dementia   . DJD (degenerative joint disease)   . DVT (deep venous thrombosis) (HCC)   . Gout   . Peripheral neuropathy   . Permanent atrial fibrillation [facility])    MDT [id number] pacemaker 03-26-2013 by Dr Ladona Ridgel  . Pulmonary embolism [facility])     Patient Active Problem List   Diagnosis Date Noted  . Hypokalemia 12/05/2016  . Dementia with behavioral disturbance 04/25/2016  . Noncompliance with medication regimen 04/24/2016  . UTI (urinary tract infection) 04/18/2016  . Chest pain 02/10/2014  . Edema 02/10/2014  . Encounter for therapeutic drug monitoring 08/13/2013  . Chronic anticoagulation 07/22/2010  . HYPOTENSION, ORTHOSTATIC 06/15/2010  . Essential hypertension, benign 04/06/2009  . Cardiac pacemaker in situ 04/06/2009  . PULMONARY EMBOLISM 01/18/2009  .  ATRIAL FIBRILLATION, CHRONIC 01/18/2009  . BRADYCARDIA-TACHYCARDIA SYNDROME 01/18/2009  . DVT 01/18/2009    Past Surgical History:  Procedure Laterality Date  . CARDIAC CATHETERIZATION  2001  . Craniotomy clipping  06-2002   unruptured  . Left paraclinoid segment anurysum     [facility] [address] [facility]  . PACEMAKER INSERTION  09-13-1999; 03-26-2013   PPM gen change by Dr Ladona Ridgel 03-26-2013 - MDT [id number]  . PERMANENT PACEMAKER GENERATOR CHANGE N/A 03/26/2013   Procedure: PERMANENT PACEMAKER GENERATOR CHANGE;  Surgeon: Marinus Maw, MD;  Location[facility] CATH LAB;  Service: Cardiovascular;  Laterality: N/A;     OB History   None      Home Medications    Prior to Admission medications   Medication Sig Start Date End Date Taking? Authorizing Provider  allopurinol (ZYLOPRIM) 300 MG tablet Take 300 mg by mouth Daily.  04/07/11  Yes [provider]  apixaban (ELIQUIS) 5 MG TABS tablet Take 5 mg by mouth 2 (two) times daily.   Yes [provider]  Dextromethorphan-guaiFENesin (Q-TUSSIN DM) 10-100 MG/5ML liquid Take 5 mLs by mouth every 4 (four) hours as needed (for cough).   Yes [provider]  digoxin (LANOXIN) 0.125 MG tablet Take 0.0625 mg by mouth daily.    Yes [provider]  diltiazem (CARDIZEM CD) 240 MG 24 hr capsule Take 240 mg by mouth daily.     Yes [provider]  donepezil (ARICEPT)  5 MG tablet Take 1 tablet (5 mg total) by mouth at bedtime. 04/25/16  Yes Kari Baars, MD  furosemide (LASIX) 40 MG tablet Take 40 mg by mouth daily as needed for fluid (for swelling).    Yes [provider]  hydrocortisone 2.5 % cream Apply 1 application topically 4 (four) times daily as needed.   Yes [provider]  ipratropium (ATROVENT) 0.06 % nasal spray Place 2 sprays into both nostrils 2 (two) times daily as needed for rhinitis.   Yes [provider]  metoprolol succinate (TOPROL-XL) 100 MG 24 hr tablet TAKE 1  TABLET (100 MG TOTAL) BY MOUTH DAILY. Patient taking differently: Take 100 mg by mouth daily.  06/20/15  Yes Dyann Kief, PA-C  sodium chloride (OCEAN) 0.65 % SOLN nasal spray Place 2 sprays into both nostrils 3 (three) times daily as needed for congestion.   Yes [provider]    Family History Family History  Problem Relation Age of Onset  . Heart attack Mother   . Heart attack Father   . Alzheimer's disease Sister     Social History Social History   Tobacco Use  . Smoking status: Former Smoker    Packs/day: 0.25    Years: 35.00    Pack years: 8.75    Types: Cigarettes    Start date: 12/27/1977    Last attempt to quit: 05/26/2010    Years since quitting: 7.6  . Smokeless tobacco: Never Used  Substance Use Topics  . Alcohol use: No    Alcohol/week: 0.0 standard drinks  . Drug use: No     Allergies   Bee venom and Codeine   Review of Systems Review of Systems  Constitutional: Negative for chills and fever.  HENT: Negative for ear pain and sore throat.   Eyes: Negative for pain and visual disturbance.  Respiratory: Negative for cough and shortness of breath.   Cardiovascular: Negative for chest pain and palpitations.  Gastrointestinal: Negative for abdominal pain and vomiting.  Genitourinary: Negative for dysuria and hematuria.  Musculoskeletal: Positive for gait problem. Negative for arthralgias and back pain.  Skin: Negative for color change and rash.  Neurological: Negative for seizures, syncope and headaches.  All other systems reviewed and are negative.    Physical Exam Updated Vital Signs  ED Triage Vitals  Enc Vitals Group     BP 01/05/18 2204 136/60     Pulse Rate 01/05/18 2204 60     Resp 01/05/18 2204 19     Temp 01/05/18 2204 97.8 F (36.6 C)     Temp Source 01/05/18 2204 Oral     SpO2 01/05/18 2204 96 %     Weight 01/05/18 2159 156 lb 4.9 oz (70.9 kg)     Height 01/05/18 2159 5\' 8"  (1.727 m)     Head Circumference --      Peak  Flow --      Pain Score --      Pain Loc --      Pain Edu? --      Excl. in GC? --     Physical Exam  Constitutional: She is oriented to person, place, and time. She appears well-developed and well-nourished. No distress.  HENT:  Head: Normocephalic and atraumatic.  Eyes: Pupils are equal, round, and reactive to light. Conjunctivae and EOM are normal.  Neck: Normal range of motion. Neck supple.  Cardiovascular: Normal rate, regular rhythm, normal heart sounds and intact distal pulses.  No murmur heard.  Pulmonary/Chest: Effort normal and breath sounds normal. No respiratory distress.  Abdominal: Soft. There is no tenderness.  Musculoskeletal: Normal range of motion. She exhibits tenderness (TTP to left gluteal area ). She exhibits no edema or deformity.  Neurological: She is alert and oriented to person, place, and time.  Skin: Skin is warm and dry.  Psychiatric: She has a normal mood and affect.  Nursing note and vitals reviewed.    ED Treatments / Results  Labs (all labs ordered are listed, but only abnormal results are displayed) Labs Reviewed - No data to display  EKG None  Radiology Dg Hip Port Unilat W Or Wo Pelvis 1 View Left  Result Date: 01/05/2018 CLINICAL DATA:  84 year old female with left hip pain. EXAM: DG HIP (WITH OR WITHOUT PELVIS) 1V PORT LEFT COMPARISON:  Left hip radiograph dated 03/25/2015 FINDINGS: There is no acute fracture or dislocation. Advanced bilateral hip arthritic changes with narrowing of the joint space. There is osteopenia. Degenerative changes of the visualized lower lumbar spine. Stable small calcific focus in the right hemipelvis may represent vascular calcification or less likely calcified fibroid. The soft tissues are grossly unremarkable. IMPRESSION: 1. No acute fracture or dislocation. 2. Osteopenia with advanced arthritic changes of the hips. Electronically Signed   By: Elgie Collard M.D.   On: 01/05/2018 23:12     Procedures Procedures (including critical care time)  Medications Ordered in ED Medications  lidocaine (LIDODERM) 5 % 1 patch (1 patch Transdermal Patch Applied 01/05/18 2300)  acetaminophen (TYLENOL) tablet 650 mg (650 mg Oral Given 01/05/18 2300)     Initial Impression / Assessment and Plan / ED Course  I have reviewed the triage vital signs and the nursing notes.  Pertinent labs & imaging results that were available during my care of the patient were reviewed by me and considered in my medical decision making (see chart for details).     [name] is a 84 year old female history of atrial fibrillation, PE, COPD, dementia who presents to the ED with left hip pain.  Patient with normal vitals.  No fever.  Patient denies any trauma.  Has pain from the left hip down to her knee.  Patient states that it feels like a shooting pain.  She denies any low back pain.  Has no midline spinal tenderness on exam.  Patient is tender in the left gluteal area consistent with a muscle spasm.  Has good range of motion of the left lower extremity.  Neurovascularly intact in the left lower extremity.  Suspect patient likely with musculoskeletal pain such as muscle spasm.  X-ray showed no acute fracture.  Patient was given Tylenol and lidocaine patch with some relief of pain. No concern for DVT given exam.  Recommend continued use of Tylenol at home as needed for pain.  Discharged from the ED in good condition.  Told her return to the ED if symptoms worsen.  Recommend follow-up with primary care doctor and may benefit from physical therapy.  Final Clinical Impressions(s) / ED Diagnoses   Final diagnoses:  Sciatica of left side  Left hip pain    ED Discharge Orders    None       Virgina Norfolk, DO 01/05/18 2323

## 2018-02-03 IMAGING — DX DG CHEST 1V
1 series · 1 of 1 positions shown · non-contrast
Comparison: Chest x-ray of 02/10/2013

CLINICAL DATA: Fell last night, now with chest pain

EXAM:
CHEST 1 VIEW

[chest ap]
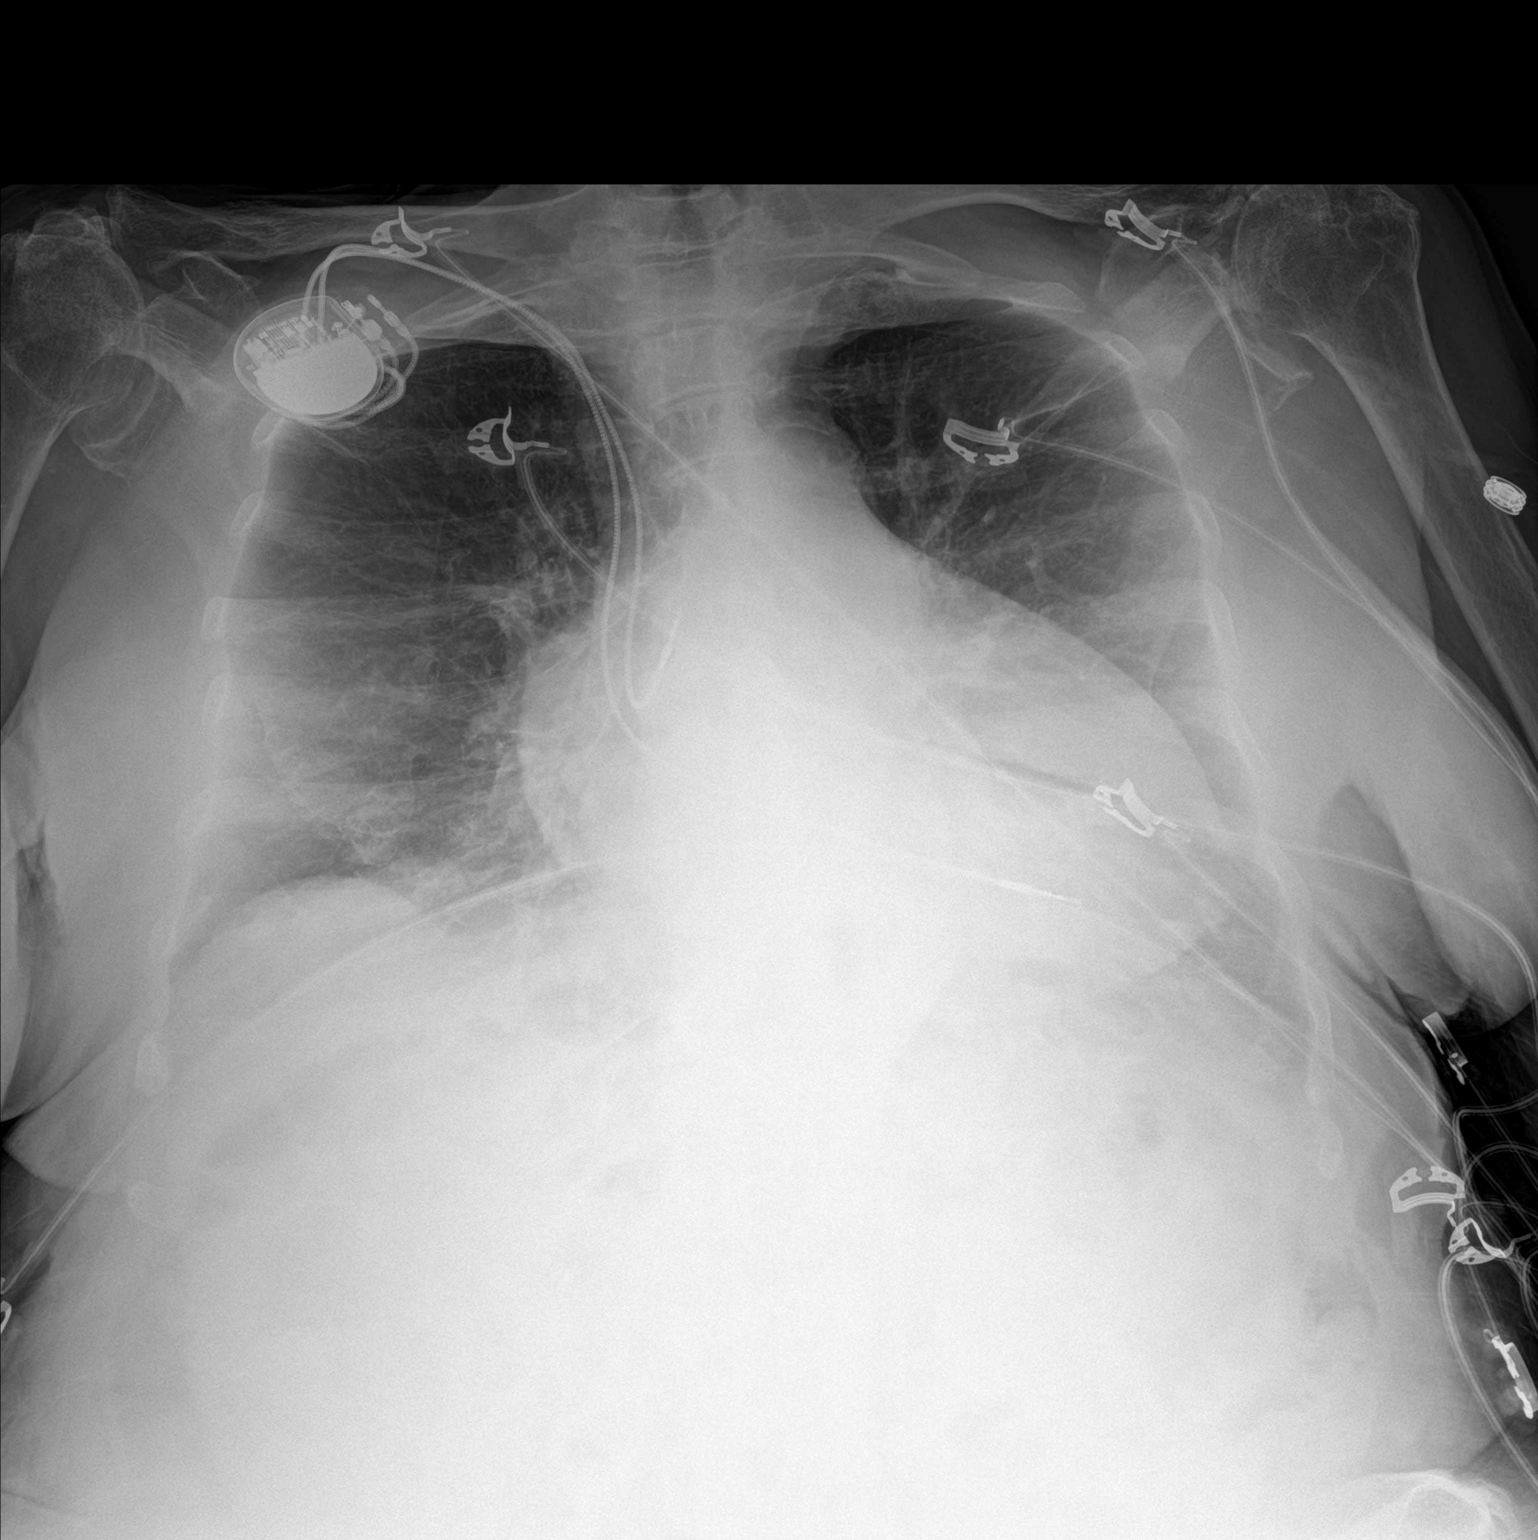

[1 of 1 positions shown; findings below may reference images not displayed]

FINDINGS: No active infiltrate or effusion is seen. Mediastinal and hilar
contours are unremarkable. Cardiomegaly is stable with dual lead
permanent pacemaker present. The bones are osteopenic and there are
considerable degenerative changes within the shoulders.
IMPRESSION: 1. Stable cardiomegaly with permanent pacemaker.
2. No pneumonia or effusion.

## 2018-05-13 ENCOUNTER — Ambulatory Visit (HOSPITAL_COMMUNITY)
Admission: RE | Admit: 2018-05-13 | Discharge: 2018-05-13 | Disposition: A | Discharge status: Home / Self Care | Payer: Medicare Other | Source: Ambulatory Visit | Attending: Pulmonary Disease | Admitting: Pulmonary Disease

## 2018-05-13 ENCOUNTER — Other Ambulatory Visit (HOSPITAL_COMMUNITY): Payer: Self-pay | Admitting: Pulmonary Disease

## 2018-05-13 DIAGNOSIS — M25512 Pain in left shoulder: Secondary | ICD-10-CM | POA: Insufficient documentation

## 2018-06-24 ENCOUNTER — Encounter: Payer: Self-pay | Admitting: Internal Medicine

## 2018-06-24 ENCOUNTER — Ambulatory Visit (INDEPENDENT_AMBULATORY_CARE_PROVIDER_SITE_OTHER): Payer: Medicare Other | Admitting: Internal Medicine

## 2018-06-24 VITALS — BP 114/58 | HR 62 | Ht 69.0 in | Wt 173.0 lb

## 2018-06-24 DIAGNOSIS — I482 Chronic atrial fibrillation, unspecified: Secondary | ICD-10-CM

## 2018-06-24 DIAGNOSIS — I4891 Unspecified atrial fibrillation: Secondary | ICD-10-CM

## 2018-06-24 DIAGNOSIS — Z950 Presence of cardiac pacemaker: Secondary | ICD-10-CM | POA: Diagnosis not present

## 2018-06-24 NOTE — Progress Notes (Signed)
HPI [name] returns today for followup of her atrial fib, s/p PPM insertion. She has a h/o heart block and underwent PPM insertion over 5 years ago. In the interim, she has done well except for some bronchitis. She denies chest pain or sob.  Allergies  Allergen Reactions  . Bee Venom Swelling  . Codeine     REACTION: GI distress     Current Outpatient Medications  Medication Sig Dispense Refill  . allopurinol (ZYLOPRIM) 300 MG tablet Take 300 mg by mouth Daily.     Marland Kitchen apixaban (ELIQUIS) 5 MG TABS tablet Take 5 mg by mouth 2 (two) times daily.    Marland Kitchen Dextromethorphan-guaiFENesin (Q-TUSSIN DM) 10-100 MG/5ML liquid Take 5 mLs by mouth every 4 (four) hours as needed (for cough).    . digoxin (LANOXIN) 0.125 MG tablet Take 0.0625 mg by mouth daily.     Marland Kitchen diltiazem (CARDIZEM CD) 240 MG 24 hr capsule Take 240 mg by mouth daily.      Marland Kitchen donepezil (ARICEPT) 5 MG tablet Take 1 tablet (5 mg total) by mouth at bedtime. 30 tablet 12  . furosemide (LASIX) 40 MG tablet Take 40 mg by mouth daily as needed for fluid (for swelling).     . hydrocortisone 2.5 % cream Apply 1 application topically 4 (four) times daily as needed.    Marland Kitchen ipratropium (ATROVENT) 0.06 % nasal spray Place 2 sprays into both nostrils 2 (two) times daily as needed for rhinitis.    . metoprolol succinate (TOPROL-XL) 100 MG 24 hr tablet TAKE 1 TABLET (100 MG TOTAL) BY MOUTH DAILY. (Patient taking differently: Take 100 mg by mouth daily. ) 30 tablet 11  . sodium chloride (OCEAN) 0.65 % SOLN nasal spray Place 2 sprays into both nostrils 3 (three) times daily as needed for congestion.     No current facility-administered medications for this visit.      Past Medical History:  Diagnosis Date  . Arthritis   . Bilateral chronic knee pain   . Brain aneurysm   . COPD (chronic obstructive pulmonary disease) (HCC)   . DDD (degenerative disc disease), lumbar   . Dementia (HCC)   . DJD (degenerative joint disease)   . DVT (deep  venous thrombosis) (HCC)   . Gout   . Peripheral neuropathy   . Permanent atrial fibrillation    MDT [id number] pacemaker 03-26-2013 by Dr Ladona Ridgel  . Pulmonary embolism (HCC)     ROS:   All systems reviewed and negative except as noted in the HPI.   Past Surgical History:  Procedure Laterality Date  . CARDIAC CATHETERIZATION  2001  . Craniotomy clipping  06-2002   unruptured  . Left paraclinoid segment anurysum     [facility]  . PACEMAKER INSERTION  09-13-1999; 03-26-2013   PPM gen change by Dr Ladona Ridgel 03-26-2013 - MDT [id number]  . PERMANENT PACEMAKER GENERATOR CHANGE N/A 03/26/2013   Procedure: PERMANENT PACEMAKER GENERATOR CHANGE;  Surgeon: Marinus Maw, MD;  Location: [facility] CATH LAB;  Service: Cardiovascular;  Laterality: N/A;     Family History  Problem Relation Age of Onset  . Heart attack Mother   . Heart attack Father   . Alzheimer's disease Sister      Social History   Socioeconomic History  . Marital status: Widowed    Spouse name: Not on file  . Number of children: Not on file  . Years of education: Not on file  . Highest  education level: Not on file  Occupational History  . Occupation: retired from the telephone company  Social Needs  . Financial resource strain: Not on file  . Food insecurity:    Worry: Not on file    Inability: Not on file  . Transportation needs:    Medical: Not on file    Non-medical: Not on file  Tobacco Use  . Smoking status: Former Smoker    Packs/day: 0.25    Years: 35.00    Pack years: 8.75    Types: Cigarettes    Start date: 12/27/1977    Last attempt to quit: 05/26/2010    Years since quitting: 8.0  . Smokeless tobacco: Never Used  Substance and Sexual Activity  . Alcohol use: No    Alcohol/week: 0.0 standard drinks  . Drug use: No  . Sexual activity: Not on file  Lifestyle  . Physical activity:    Days per week: Not on file    Minutes per session: Not on file  . Stress: Not on file  Relationships   . Social connections:    Talks on phone: Not on file    Gets together: Not on file    Attends religious service: Not on file    Active member of club or organization: Not on file    Attends meetings of clubs or organizations: Not on file    Relationship status: Not on file  . Intimate partner violence:    Fear of current or ex partner: Not on file    Emotionally abused: Not on file    Physically abused: Not on file    Forced sexual activity: Not on file  Other Topics Concern  . Not on file  Social History Narrative  . Not on file     BP (!) 114/58   Pulse 62   Ht 5\' 9"  (1.753 m)   Wt 173 lb (78.5 kg)   SpO2 98%   BMI 25.55 kg/m   Physical Exam:  Well appearing NAD HEENT: Unremarkable Neck:  No JVD, no thyromegally Lymphatics:  No adenopathy Back:  No CVA tenderness Lungs:  Clear except for some scattered rales. No wheezes or rhonchi. Well healed PPM incision. HEART:  Regular rate rhythm, no murmurs, no rubs, no clicks Abd:  soft, positive bowel sounds, no organomegally, no rebound, no guarding Ext:  2 plus pulses, no edema, no cyanosis, no clubbing Skin:  No rashes no nodules Neuro:  CN II through XII intact, motor grossly intact   DEVICE  Normal device function.  See PaceArt for details.   Assess/Plan: 1. CHB - she is asymptomatic, s/p PPM insertion.  2. PPM - her medtronic DDD PM insertion is working normally. She has about 3.5 years of battery longevity. 3. Atrial fib - her ventricular rate is well controlled. She will continue her current meds.  Leonia Reeves.D.

## 2018-06-24 NOTE — Patient Instructions (Signed)
Medication Instructions:  Your physician recommends that you continue on your current medications as directed. Please refer to the Current Medication list given to you today.  If you need a refill on your cardiac medications before your next appointment, please call your pharmacy.   Lab work: NONE   If you have labs (blood work) drawn today and your tests are completely normal, you will receive your results only by: . MyChart Message (if you have MyChart) OR . A paper copy in the mail If you have any lab test that is abnormal or we need to change your treatment, we will call you to review the results.  Testing/Procedures: NONE   Follow-Up: At [facility], you and your health needs are our priority.  As part of our continuing mission to provide you with exceptional heart care, we have created designated Provider Care Teams.  These Care Teams include your primary Cardiologist (physician) and Advanced Practice Providers (APPs -  Physician Assistants and Nurse Practitioners) who all work together to provide you with the care you need, when you need it. You will need a follow up appointment in 1 years.  Please call our office 2 months in advance to schedule this appointment.  You may see Gregg Taylor, MD or one of the following Advanced Practice Providers on your designated Care Team:   Amber Seiler, NP . Renee Ursuy, PA-C  Any Other Special Instructions Will Be Listed Below (If Applicable). Thank you for choosing [facility]!     

## 2019-04-13 ENCOUNTER — Telehealth: Payer: Self-pay | Admitting: Internal Medicine

## 2019-04-13 NOTE — Telephone Encounter (Signed)
New Message    [name] is calling and says she will need to assist the pt because she is 85 years old and uses a cane to walk.    Please call

## 2019-04-13 NOTE — Telephone Encounter (Signed)
Patient unsteady on her feet and needs assistance at appointment. OK for 1 person to assist her .

## 2019-04-14 ENCOUNTER — Ambulatory Visit (INDEPENDENT_AMBULATORY_CARE_PROVIDER_SITE_OTHER): Payer: Medicare Other | Admitting: Student

## 2019-04-14 ENCOUNTER — Other Ambulatory Visit: Payer: Self-pay

## 2019-04-14 DIAGNOSIS — I495 Sick sinus syndrome: Secondary | ICD-10-CM | POA: Diagnosis not present

## 2019-04-14 DIAGNOSIS — I482 Chronic atrial fibrillation, unspecified: Secondary | ICD-10-CM

## 2019-04-14 LAB — CUP PACEART INCLINIC DEVICE CHECK
Battery Impedance: 2185 Ohm
Battery Remaining Longevity: 33 mo
Battery Voltage: 2.74 V
Brady Statistic RV Percent Paced: 80 %
Date Time Interrogation Session: 20201208153228
Implantable Lead Implant Date: 20010509
Implantable Lead Location: 753860
Implantable Lead Model: 5076
Implantable Pulse Generator Implant Date: 20141120
Lead Channel Impedance Value: 0 Ohm
Lead Channel Impedance Value: 447 Ohm
Lead Channel Pacing Threshold Amplitude: 0.5 V
Lead Channel Pacing Threshold Amplitude: 0.5 V
Lead Channel Pacing Threshold Pulse Width: 0.4 ms
Lead Channel Pacing Threshold Pulse Width: 0.4 ms
Lead Channel Setting Pacing Amplitude: 2.5 V
Lead Channel Setting Pacing Pulse Width: 0.4 ms
Lead Channel Setting Sensing Sensitivity: 2 mV

## 2019-04-14 NOTE — Progress Notes (Signed)
Pacemaker check in clinic. Normal device function. Thresholds, sensing, impedances consistent with previous measurements. Single lead, no atrial data. No HVR. Device programmed at appropriate safety margins. Histogram distribution appropriate for patient activity level. Estimated longevity 2.5 yrs. Pt in facility and not remote-capable. RTC q 6 months, next with Dr. Lovena Le.

## 2019-04-14 NOTE — Patient Instructions (Signed)
Your pacemaker is functioning normally. You have about 2.5 years of battery left.   You will need to see Dr. Lovena Le in 6 months.

## 2019-07-11 ENCOUNTER — Encounter (HOSPITAL_COMMUNITY): Payer: Self-pay

## 2019-07-11 ENCOUNTER — Emergency Department (HOSPITAL_COMMUNITY)
Admission: EM | Admit: 2019-07-11 | Discharge: 2019-07-11 | Disposition: A | Discharge status: Home / Self Care | Payer: Medicare Other | Attending: Emergency Medicine | Admitting: Emergency Medicine

## 2019-07-11 DIAGNOSIS — I10 Essential (primary) hypertension: Secondary | ICD-10-CM | POA: Diagnosis not present

## 2019-07-11 DIAGNOSIS — N3000 Acute cystitis without hematuria: Secondary | ICD-10-CM

## 2019-07-11 DIAGNOSIS — N390 Urinary tract infection, site not specified: Secondary | ICD-10-CM | POA: Insufficient documentation

## 2019-07-11 DIAGNOSIS — N398 Other specified disorders of urinary system: Secondary | ICD-10-CM | POA: Diagnosis present

## 2019-07-11 DIAGNOSIS — Z79899 Other long term (current) drug therapy: Secondary | ICD-10-CM | POA: Diagnosis not present

## 2019-07-11 LAB — URINALYSIS, ROUTINE W REFLEX MICROSCOPIC
Bilirubin Urine: NEGATIVE
Glucose, UA: NEGATIVE mg/dL
Hgb urine dipstick: NEGATIVE
Ketones, ur: NEGATIVE mg/dL
Nitrite: POSITIVE — AB
Protein, ur: NEGATIVE mg/dL
Specific Gravity, Urine: 1.017 (ref 1.005–1.030)
WBC, UA: >50 WBC/hpf — ABNORMAL HIGH (ref 0–5)
pH: 6 (ref 5.0–8.0)

## 2019-07-11 MED ORDER — CEPHALEXIN 500 MG PO CAPS: 500.0000 mg | ORAL_CAPSULE | Freq: Two times a day (BID) | ORAL | 0 refills | Status: DC

## 2019-07-11 NOTE — ED Provider Notes (Signed)
[facility] EMERGENCY DEPARTMENT Provider Note   CSN: [id number] Arrival date & time: 07/11/19  0354     History Chief Complaint  Patient presents with  . urinary problem    [name] is a 86 y.o. female. Level 5 caveat due to dementia. HPI Patient presents from a group home.  Reportedly had foul-smelling urine.  Patient without complaints.  Mild dementia but is awake and pleasant.  Denies abdominal pain.  Denies fever.  Denies dysuria.    Past Medical History:  Diagnosis Date  . Arthritis   . Bilateral chronic knee pain   . Brain aneurysm   . COPD (chronic obstructive pulmonary disease) ([place])   . DDD (degenerative disc disease), lumbar   . Dementia ([place])   . DJD (degenerative joint disease)   . DVT (deep venous thrombosis) ([place])   . Gout   . Peripheral neuropathy   . Permanent atrial fibrillation [facility])    MDT [id number] pacemaker 03-26-2013 by Dr Lovena Le  . Pulmonary embolism [facility])     Patient Active Problem List   Diagnosis Date Noted  . Hypokalemia 12/05/2016  . Dementia with behavioral disturbance ([place]) 04/25/2016  . Noncompliance with medication regimen 04/24/2016  . UTI (urinary tract infection) 04/18/2016  . Chest pain 02/10/2014  . Edema 02/10/2014  . Encounter for therapeutic drug monitoring 08/13/2013  . Chronic anticoagulation 07/22/2010  . HYPOTENSION, ORTHOSTATIC 06/15/2010  . Essential hypertension, benign 04/06/2009  . Cardiac pacemaker in situ 04/06/2009  . PULMONARY EMBOLISM 01/18/2009  . ATRIAL FIBRILLATION, CHRONIC 01/18/2009  . BRADYCARDIA-TACHYCARDIA SYNDROME 01/18/2009  . DVT 01/18/2009    Past Surgical History:  Procedure Laterality Date  . CARDIAC CATHETERIZATION  2001  . Craniotomy clipping  06-2002   unruptured  . Left paraclinoid segment anurysum     [facility]  . PACEMAKER INSERTION  09-13-1999; 03-26-2013   PPM gen change by Dr Lovena Le 03-26-2013 - MDT [id number]  . PERMANENT PACEMAKER GENERATOR CHANGE  N/A 03/26/2013   Procedure: PERMANENT PACEMAKER GENERATOR CHANGE;  Surgeon: Evans Lance, MD;  Location: [facility] CATH LAB;  Service: Cardiovascular;  Laterality: N/A;     OB History   No obstetric history on file.     Family History  Problem Relation Age of Onset  . Heart attack Mother   . Heart attack Father   . Alzheimer's disease Sister     Social History   Tobacco Use  . Smoking status: Former Smoker    Packs/day: 0.25    Years: 35.00    Pack years: 8.75    Types: Cigarettes    Start date: 12/27/1977    Quit date: 05/26/2010    Years since quitting: 9.1  . Smokeless tobacco: Never Used  Substance Use Topics  . Alcohol use: No    Alcohol/week: 0.0 standard drinks  . Drug use: No    Home Medications Prior to Admission medications   Medication Sig Start Date End Date Taking? Authorizing Provider  allopurinol (ZYLOPRIM) 300 MG tablet Take 300 mg by mouth Daily.  04/07/11   [provider]  apixaban (ELIQUIS) 5 MG TABS tablet Take 5 mg by mouth 2 (two) times daily.    [provider]  Dextromethorphan-guaiFENesin (Q-TUSSIN DM) 10-100 MG/5ML liquid Take 5 mLs by mouth every 4 (four) hours as needed (for cough).    [provider]  digoxin (LANOXIN) 0.125 MG tablet Take 0.0625 mg by mouth daily.     [provider]  diltiazem (CARDIZEM  CD) 240 MG 24 hr capsule Take 240 mg by mouth daily.      [provider]  donepezil (ARICEPT) 5 MG tablet Take 1 tablet (5 mg total) by mouth at bedtime. 04/25/16   Kari Baars, MD  furosemide (LASIX) 40 MG tablet Take 40 mg by mouth daily as needed for fluid (for swelling).     [provider]  hydrocortisone 2.5 % cream Apply 1 application topically 4 (four) times daily as needed.    [provider]  ipratropium (ATROVENT) 0.06 % nasal spray Place 2 sprays into both nostrils 2 (two) times daily as needed for rhinitis.    [provider]  metoprolol succinate (TOPROL-XL)  100 MG 24 hr tablet TAKE 1 TABLET (100 MG TOTAL) BY MOUTH DAILY. Patient taking differently: Take 100 mg by mouth daily.  06/20/15   Dyann Kief, PA-C  sodium chloride (OCEAN) 0.65 % SOLN nasal spray Place 2 sprays into both nostrils 3 (three) times daily as needed for congestion.    [provider]    Allergies    Bee venom and Codeine  Review of Systems   Review of Systems  Unable to perform ROS: Dementia    Physical Exam Updated Vital Signs BP (!) 143/68 (BP Location: Right Arm)   Pulse 63   Temp 98 F (36.7 C) (Oral)   Resp 16   SpO2 98%   Physical Exam Vitals and nursing note reviewed.  Constitutional:      Appearance: Normal appearance.  HENT:     Head: Atraumatic.  Eyes:     General: No scleral icterus. Cardiovascular:     Rate and Rhythm: Regular rhythm.  Pulmonary:     Breath sounds: No wheezing or rhonchi.  Abdominal:     Tenderness: There is abdominal tenderness.     Comments: Mild suprapubic tenderness without rebound or guarding.  No hernia palpated.  Musculoskeletal:        General: No tenderness.     Cervical back: Neck supple.  Skin:    General: Skin is warm.     Capillary Refill: Capillary refill takes less than 2 seconds.  Neurological:     Mental Status: She is alert. Mental status is at baseline.     Comments: Awake and pleasant, but mild confusion.     ED Results / Procedures / Treatments   Labs (all labs ordered are listed, but only abnormal results are displayed) Labs Reviewed  URINALYSIS, ROUTINE W REFLEX MICROSCOPIC    EKG None  Radiology No results found.  Procedures Procedures (including critical care time)  Medications Ordered in ED Medications - No data to display  ED Course  I have reviewed the triage vital signs and the nursing notes.  Pertinent labs & imaging results that were available during my care of the patient were reviewed by me and considered in my medical decision making (see chart for  details).    MDM Rules/Calculators/A&P                      Patient with foul smell of thin urine from group home.  Well-appearing.  Patient is really without complaints.  Urine shows infection.  Culture sent and will discharge home with antibiotics Final Clinical Impression(s) / ED Diagnoses Final diagnoses:  None    Rx / DC Orders ED Discharge Orders    None       Benjiman Core, MD 07/11/19 [phone]

## 2019-07-11 NOTE — Discharge Instructions (Addendum)
We also have sent a urinary culture.  You will be notified of the results if they do not match up with the antibiotic you were given.

## 2019-07-11 NOTE — ED Triage Notes (Signed)
Pt lives at group home. Brought in by EMS. Caregiver reports that her urine smells bad/. Pt denies pain or burning with urination

## 2019-07-13 LAB — URINE CULTURE: Culture: >=100000 — AB

## 2019-07-14 ENCOUNTER — Telehealth: Payer: Self-pay

## 2019-07-14 NOTE — Telephone Encounter (Signed)
Post ED Visit - Positive Culture Follow-up  Culture report reviewed by antimicrobial stewardship pharmacist: [facility] []  , Pharm.D. [x]  Enzo Bi, Pharm.D., BCPS AQ-ID []  , Pharm.D., BCPS []  Celedonio Miyamoto, Pharm.D., BCPS []  Melody Hill, Garvin Fila.D., BCPS, AAHIVP []  , Pharm.D., BCPS, AAHIVP []  Georgina Pillion, PharmD, BCPS []  , PharmD, BCPS []  [place], PharmD, BCPS []  [address], PharmD []  , PharmD, BCPS []  Estella Husk, PharmD  Pharmacy Team []  Lysle Pearl, PharmD []  , PharmD []  Phillips Climes, PharmD []  , Rph []  Agapito Games) , PharmD []  Verlan Friends, PharmD []  , PharmD []  Mervyn Gay, PharmD []  , PharmD []  Vinnie Level, PharmD []  [facility], PharmD []  , PharmD []  Len Childs, PharmD    Positive urine culture Treated with Cephalexin, organism sensitive to the same and no further patient follow-up is required at this time.  07/14/2019, 10:14 AM

## 2019-07-20 ENCOUNTER — Telehealth: Payer: Self-pay | Admitting: Emergency Medicine

## 2019-07-20 ENCOUNTER — Other Ambulatory Visit: Payer: Self-pay | Admitting: Family Medicine

## 2019-07-20 MED ORDER — ALLOPURINOL 300 MG PO TABS: 300.0000 mg | ORAL_TABLET | Freq: Every day | ORAL | 5 refills | Status: DC

## 2019-07-20 NOTE — Telephone Encounter (Signed)
Nurse visit-test for cure

## 2019-07-20 NOTE — Telephone Encounter (Signed)
patient was seen in the ER for a UTI on 07/11/18. Patient wants to know do she need to make an appt to come into the office or can she just stop by to have urine checked

## 2019-07-20 NOTE — Telephone Encounter (Signed)
Patient was informed.

## 2019-07-22 ENCOUNTER — Encounter: Payer: Self-pay | Admitting: Family Medicine

## 2019-07-22 ENCOUNTER — Ambulatory Visit (INDEPENDENT_AMBULATORY_CARE_PROVIDER_SITE_OTHER): Payer: Medicare Other | Admitting: Family Medicine

## 2019-07-22 ENCOUNTER — Other Ambulatory Visit: Payer: Self-pay

## 2019-07-22 VITALS — BP 131/71 | HR 60 | Temp 97.6°F | Wt 186.2 lb

## 2019-07-22 DIAGNOSIS — N390 Urinary tract infection, site not specified: Secondary | ICD-10-CM

## 2019-07-22 DIAGNOSIS — I10 Essential (primary) hypertension: Secondary | ICD-10-CM | POA: Diagnosis not present

## 2019-07-22 DIAGNOSIS — F0391 Unspecified dementia with behavioral disturbance: Secondary | ICD-10-CM | POA: Diagnosis not present

## 2019-07-22 DIAGNOSIS — L570 Actinic keratosis: Secondary | ICD-10-CM

## 2019-07-22 LAB — POCT URINALYSIS DIP (MANUAL ENTRY)
Glucose, UA: NEGATIVE mg/dL
Ketones, POC UA: NEGATIVE mg/dL
Leukocytes, UA: NEGATIVE
Nitrite, UA: NEGATIVE
Protein Ur, POC: NEGATIVE mg/dL
Spec Grav, UA: >=1.03 — AB (ref 1.010–1.025)
Urobilinogen, UA: 1 E.U./dL
pH, UA: 5.5 (ref 5.0–8.0)

## 2019-07-22 MED ORDER — NITROFURANTOIN MONOHYD MACRO 100 MG PO CAPS: 100.0000 mg | ORAL_CAPSULE | Freq: Two times a day (BID) | ORAL | 0 refills | Status: DC

## 2019-07-22 NOTE — Progress Notes (Signed)
New Patient Office Visit  Subjective:  Patient ID: [name], female    DOB: 11-20-1925  Age: 86 y.o. MRN: [id number]  CC:  Chief Complaint  Patient presents with  . Hospitalization Follow-up    f/u of uti. Urine today for cure    HPI SAVON COBBS presents for acute cystitis -keflex taken for treatment. E coli on culture resistant to PEN and Cipro. Caregiver states continued foul odor that improved after antibiotics but not resolved. No abdominal pain. No pain with urination. Caregiver states urgency and frequency of urination. Chronic back pain. No temp elevation.   Pt scratching at skin lesion on chest-caregiver states long term but irritated due to scratching.  Lesions on face and neck  Past Medical History:  Diagnosis Date  . Arthritis   . Bilateral chronic knee pain   . Brain aneurysm   . COPD (chronic obstructive pulmonary disease) (H[facility])   . DDD (degenerative disc disease), lumbar   . Dementia (H[facility])   . DJD (degenerative joint disease)   . DVT (deep venous thrombosis) ([facility])   . Gout   . Peripheral neuropathy   . Permanent atrial fibrillation [facility])    MDT [id number] pacemaker 03-26-2013 by Dr Ladona Ridgel  . Pulmonary embolism [facility])     Past Surgical History:  Procedure Laterality Date  . CARDIAC CATHETERIZATION  2001  . Craniotomy clipping  06-2002   unruptured  . Left paraclinoid segment anurysum     [facility]  . PACEMAKER INSERTION  09-13-1999; 03-26-2013   PPM gen change by Dr Ladona Ridgel 03-26-2013 - MDT [id number]  . PERMANENT PACEMAKER GENERATOR CHANGE N/A 03/26/2013   Procedure: PERMANENT PACEMAKER GENERATOR CHANGE;  Surgeon: Marinus Maw, MD;  Location: [facility] CATH LAB;  Service: Cardiovascular;  Laterality: N/A;    Family History  Problem Relation Age of Onset  . Heart attack Mother   . Heart attack Father   . Alzheimer's disease Sister     Social History   Socioeconomic History  . Marital status: Widowed    Spouse name: Not  on file  . Number of children: Not on file  . Years of education: Not on file  . Highest education level: Not on file  Occupational History  . Occupation: retired from the telephone company  Tobacco Use  . Smoking status: Former Smoker    Packs/day: 0.25    Years: 35.00    Pack years: 8.75    Types: Cigarettes    Start date: 12/27/1977    Quit date: 05/26/2010    Years since quitting: 9.1  . Smokeless tobacco: Never Used  Substance and Sexual Activity  . Alcohol use: No    Alcohol/week: 0.0 standard drinks  . Drug use: No  . Sexual activity: Not on file  Other Topics Concern  . Not on file  Social History Narrative  . Not on file   Social Determinants of Health   Financial Resource Strain:   . Difficulty of Paying Living Expenses:   Food Insecurity:   . Worried About Programme researcher, broadcasting/film/video in the Last Year:   . Barista in the Last Year:   Transportation Needs:   . Freight forwarder (Medical):   Marland Kitchen Lack of Transportation (Non-Medical):   Physical Activity:   . Days of Exercise per Week:   . Minutes of Exercise per Session:   Stress:   . Feeling of Stress :   Social Connections:   .  Frequency of Communication with Friends and Family:   . Frequency of Social Gatherings with Friends and Family:   . Attends Religious Services:   . Active Member of Clubs or Organizations:   . Attends Archivist Meetings:   Marland Kitchen Marital Status:   Intimate Partner Violence:   . Fear of Current or Ex-Partner:   . Emotionally Abused:   Marland Kitchen Physically Abused:   . Sexually Abused:     ROS Review of Systems  Respiratory: Negative.   Cardiovascular: Negative.   Gastrointestinal: Negative.   Genitourinary: Positive for frequency and urgency.  Psychiatric/Behavioral: Positive for confusion.       Dementia-talking about siblings who are dead per caregiver   Physical Exam  Constitutional: She is well-developed, well-nourished, and in no distress.  HENT:  Head:  Normocephalic and atraumatic.  Eyes: Conjunctivae are normal.  Cardiovascular: Normal rate and regular rhythm.  Pulmonary/Chest: Effort normal and breath sounds normal.  Neurological: She is alert.  Skin:     keratotic lesions on chest and left eye brow    Assessment & Plan:  1. Urinary tract infection without hematuria, site unspecified macrobid-rx-pt took keflex-ecoli on culture sens to cephlo-+blood noted in urine, caregiver with concern odor has returned and frequency and urgency returned as symptoms over last few days - POCT urinalysis dipstick - Urine Culture  2. Keratotic lesion Chest wall and eye brown-pt picking at area - Ambulatory referral to Dermatology  3. Essential hypertension, benign Daily medication by caregiver  4. Dementia with behavioral disturbance, unspecified dementia type ([place]) Pt taking aricept Problem List Items Addressed This Visit      Genitourinary   UTI (urinary tract infection) - Primary   Relevant Orders   POCT urinalysis dipstick   Urine Culture    reviewed ER note, reviewed old records, history from caregiver and patient, physical exam, assessment, plan  Outpatient Encounter Medications as of 07/22/2019  Medication Sig  . allopurinol (ZYLOPRIM) 300 MG tablet Take 1 tablet (300 mg total) by mouth daily.  Marland Kitchen apixaban (ELIQUIS) 5 MG TABS tablet Take 5 mg by mouth 2 (two) times daily.  . cephALEXin (KEFLEX) 500 MG capsule Take 1 capsule (500 mg total) by mouth 2 (two) times daily.  Marland Kitchen Dextromethorphan-guaiFENesin (Q-TUSSIN DM) 10-100 MG/5ML liquid Take 5 mLs by mouth every 4 (four) hours as needed (for cough).  . digoxin (LANOXIN) 0.125 MG tablet Take 0.0625 mg by mouth daily.   Marland Kitchen diltiazem (CARDIZEM CD) 240 MG 24 hr capsule Take 240 mg by mouth daily.    Marland Kitchen donepezil (ARICEPT) 5 MG tablet Take 1 tablet (5 mg total) by mouth at bedtime.  . furosemide (LASIX) 40 MG tablet Take 40 mg by mouth daily as needed for fluid (for swelling).   .  hydrocortisone 2.5 % cream Apply 1 application topically 4 (four) times daily as needed.  Marland Kitchen ipratropium (ATROVENT) 0.06 % nasal spray Place 2 sprays into both nostrils 2 (two) times daily as needed for rhinitis.  . metoprolol succinate (TOPROL-XL) 100 MG 24 hr tablet TAKE 1 TABLET (100 MG TOTAL) BY MOUTH DAILY. (Patient taking differently: Take 100 mg by mouth daily. )  . sodium chloride (OCEAN) 0.65 % SOLN nasal spray Place 2 sprays into both nostrils 3 (three) times daily as needed for congestion.   No facility-administered encounter medications on file as of 07/22/2019.    Follow-up: keep 3/31 appt  Leilanee Righetti Hannah Beat, MD

## 2019-07-22 NOTE — Patient Instructions (Addendum)
Start macrobid -take twice a day Drink water-avoid caffeine Derm referral 3/31-keep appointment

## 2019-07-23 LAB — URINE CULTURE
MICRO NUMBER:: 10261911
SPECIMEN QUALITY:: ADEQUATE

## 2019-07-29 ENCOUNTER — Ambulatory Visit: Payer: Medicare Other | Admitting: Family Medicine

## 2019-07-29 ENCOUNTER — Ambulatory Visit
Admission: EM | Admit: 2019-07-29 | Discharge: 2019-07-29 | Disposition: A | Discharge status: Home / Self Care | Payer: Medicare Other | Attending: Emergency Medicine | Admitting: Emergency Medicine

## 2019-07-29 DIAGNOSIS — Z789 Other specified health status: Secondary | ICD-10-CM | POA: Diagnosis not present

## 2019-07-29 DIAGNOSIS — Z760 Encounter for issue of repeat prescription: Secondary | ICD-10-CM | POA: Diagnosis present

## 2019-07-29 DIAGNOSIS — Z87440 Personal history of urinary (tract) infections: Secondary | ICD-10-CM | POA: Diagnosis not present

## 2019-07-29 LAB — POCT URINALYSIS DIP (MANUAL ENTRY)
Bilirubin, UA: NEGATIVE
Blood, UA: NEGATIVE
Glucose, UA: NEGATIVE mg/dL
Ketones, POC UA: NEGATIVE mg/dL
Leukocytes, UA: NEGATIVE
Nitrite, UA: NEGATIVE
Protein Ur, POC: NEGATIVE mg/dL
Spec Grav, UA: 1.02 (ref 1.010–1.025)
Urobilinogen, UA: 0.2 E.U./dL
pH, UA: 5.5 (ref 5.0–8.0)

## 2019-07-29 MED ORDER — DIGOXIN 125 MCG PO TABS: 0.0625 mg | ORAL_TABLET | Freq: Every day | ORAL | 0 refills | Status: DC

## 2019-07-29 MED ORDER — METOPROLOL SUCCINATE ER 100 MG PO TB24: 100.0000 mg | ORAL_TABLET | Freq: Every day | ORAL | 0 refills | Status: DC

## 2019-07-29 MED ORDER — ALLOPURINOL 300 MG PO TABS: 300.0000 mg | ORAL_TABLET | Freq: Every day | ORAL | 0 refills | Status: AC

## 2019-07-29 MED ORDER — APIXABAN 5 MG PO TABS: 5.0000 mg | ORAL_TABLET | Freq: Two times a day (BID) | ORAL | 0 refills | Status: DC

## 2019-07-29 MED ORDER — DONEPEZIL HCL 5 MG PO TABS: 5.0000 mg | ORAL_TABLET | Freq: Every day | ORAL | 0 refills | Status: DC

## 2019-07-29 MED ORDER — DILTIAZEM HCL ER COATED BEADS 240 MG PO CP24: 240.0000 mg | ORAL_CAPSULE | Freq: Every day | ORAL | 0 refills | Status: DC

## 2019-07-29 NOTE — Discharge Instructions (Addendum)
Urine looked great Medications refilled.   Follow up with PCP to establish care and for further evaluation and management of chronic disease Return or go to the ED if you have any new or worsening symptoms such as fever, chills, nausea, vomiting, chest pain, shortness of breath, abdominal pain, changes in bowel or bladder habits, etc..Marland Kitchen

## 2019-07-29 NOTE — ED Provider Notes (Signed)
[facility]   CC: Urine check; med refill  SUBJECTIVE:  [name] is a 86 y.o. female who presents for urine check.  Recently treated for UTI on 07/22/19.  Started on macrobid.  States she is feeling better, and symptoms have improved.  Denies fever, chills, nausea, vomiting, abdominal pain, flank pain, dysuria, urinary urgency, urinary frequency, hematuria.    Also requests med refills.  Unable to have filled by PCP today.    LMP: No LMP recorded. Patient is postmenopausal.  ROS: As in HPI.  All other pertinent ROS negative.     Past Medical History:  Diagnosis Date  . Arthritis   . Bilateral chronic knee pain   . Brain aneurysm   . COPD (chronic obstructive pulmonary disease) (H[facility])   . DDD (degenerative disc disease), lumbar   . Dementia ([facility])   . DJD (degenerative joint disease)   . DVT (deep venous thrombosis) (H[facility])   . Gout   . Peripheral neuropathy   . Permanent atrial fibrillation [facility])    MDT [id number] pacemaker 03-26-2013 by Dr Ladona Ridgel  . Pulmonary embolism [facility])    Past Surgical History:  Procedure Laterality Date  . CARDIAC CATHETERIZATION  2001  . Craniotomy clipping  06-2002   unruptured  . Left paraclinoid segment anurysum     [facility]  . PACEMAKER INSERTION  09-13-1999; 03-26-2013   PPM gen change by Dr Ladona Ridgel 03-26-2013 - MDT [id number]  . PERMANENT PACEMAKER GENERATOR CHANGE N/A 03/26/2013   Procedure: PERMANENT PACEMAKER GENERATOR CHANGE;  Surgeon: Marinus Maw, MD;  Location: [facility] CATH LAB;  Service: Cardiovascular;  Laterality: N/A;   Allergies  Allergen Reactions  . Bee Venom Swelling  . Codeine     REACTION: GI distress   No current facility-administered medications on file prior to encounter.   Current Outpatient Medications on File Prior to Encounter  Medication Sig Dispense Refill  . [DISCONTINUED] ipratropium (ATROVENT) 0.06 % nasal spray Place 2 sprays into both nostrils 2 (two) times daily as needed  for rhinitis.    . [DISCONTINUED] sodium chloride (OCEAN) 0.65 % SOLN nasal spray Place 2 sprays into both nostrils 3 (three) times daily as needed for congestion.     Social History   Socioeconomic History  . Marital status: Widowed    Spouse name: Not on file  . Number of children: Not on file  . Years of education: Not on file  . Highest education level: Not on file  Occupational History  . Occupation: retired from the telephone company  Tobacco Use  . Smoking status: Former Smoker    Packs/day: 0.25    Years: 35.00    Pack years: 8.75    Types: Cigarettes    Start date: 12/27/1977    Quit date: 05/26/2010    Years since quitting: 9.1  . Smokeless tobacco: Never Used  Substance and Sexual Activity  . Alcohol use: No    Alcohol/week: 0.0 standard drinks  . Drug use: No  . Sexual activity: Not on file  Other Topics Concern  . Not on file  Social History Narrative  . Not on file   Social Determinants of Health   Financial Resource Strain:   . Difficulty of Paying Living Expenses:   Food Insecurity:   . Worried About Programme researcher, broadcasting/film/video in the Last Year:   . Barista in the Last Year:   Transportation Needs:   . Freight forwarder (Medical):   Marland Kitchen  Lack of Transportation (Non-Medical):   Physical Activity:   . Days of Exercise per Week:   . Minutes of Exercise per Session:   Stress:   . Feeling of Stress :   Social Connections:   . Frequency of Communication with Friends and Family:   . Frequency of Social Gatherings with Friends and Family:   . Attends Religious Services:   . Active Member of Clubs or Organizations:   . Attends Archivist Meetings:   Marland Kitchen Marital Status:   Intimate Partner Violence:   . Fear of Current or Ex-Partner:   . Emotionally Abused:   Marland Kitchen Physically Abused:   . Sexually Abused:    Family History  Problem Relation Age of Onset  . Heart attack Mother   . Heart attack Father   . Alzheimer's disease Sister      OBJECTIVE:  Vitals:   07/29/19 1603  BP: 119/60  Pulse: (!) 105  Resp: 18  Temp: 98.6 F (37 C)  TempSrc: Tympanic  SpO2: 91%   General appearance: Alert in no acute distress HEENT: NCAT.  EOMI grossly Lungs: clear to auscultation bilaterally without adventitious breath sounds Heart: regular rate and rhythm.   Abdomen: soft; non-distended; no tenderness; bowel sounds present; no guarding  Back: no CVA tenderness Extremities: no edema; symmetrical with no gross deformities Skin: warm and dry Neurologic: Sitting in wheel chair Psychological: alert and cooperative; normal mood and affect  Labs Reviewed  URINE CULTURE  POCT URINALYSIS DIP (MANUAL ENTRY)    ASSESSMENT & PLAN:  1. Normal urinalysis   2. Medication refill     Meds ordered this encounter  Medications  . allopurinol (ZYLOPRIM) 300 MG tablet    Sig: Take 1 tablet (300 mg total) by mouth daily.    Dispense:  30 tablet    Refill:  0    Order Specific Question:   Supervising Provider    Answer:   Raylene Everts [[id number]]  . apixaban (ELIQUIS) 5 MG TABS tablet    Sig: Take 1 tablet (5 mg total) by mouth 2 (two) times daily.    Dispense:  60 tablet    Refill:  0    Order Specific Question:   Supervising Provider    Answer:   Raylene Everts [[id number]]  . digoxin (LANOXIN) 0.125 MG tablet    Sig: Take 0.5 tablets (0.0625 mg total) by mouth daily.    Dispense:  30 tablet    Refill:  0    Order Specific Question:   Supervising Provider    Answer:   Raylene Everts [[id number]]  . diltiazem (CARDIZEM CD) 240 MG 24 hr capsule    Sig: Take 1 capsule (240 mg total) by mouth daily.    Dispense:  30 capsule    Refill:  0    Order Specific Question:   Supervising Provider    Answer:   Raylene Everts [[id number]]  . donepezil (ARICEPT) 5 MG tablet    Sig: Take 1 tablet (5 mg total) by mouth at bedtime.    Dispense:  30 tablet    Refill:  0    Order Specific Question:   Supervising Provider     Answer:   Raylene Everts [[id number]]  . metoprolol succinate (TOPROL-XL) 100 MG 24 hr tablet    Sig: Take 1 tablet (100 mg total) by mouth daily. Take with or immediately following a meal.    Dispense:  30 tablet    Refill:  0    Order Specific Question:   Supervising Provider    Answer:   Eustace Moore [[id number]]   Urine looked great Medications refilled.   Follow up with PCP to establish care and for further evaluation and management of chronic disease Return or go to the ED if you have any new or worsening symptoms such as fever, chills, nausea, vomiting, chest pain, shortness of breath, abdominal pain, changes in bowel or bladder habits, etc...   Outlined signs and symptoms indicating need for more acute intervention. Patient verbalized understanding. After Visit Summary given.     Rennis Harding, PA-C 07/29/19 1624

## 2019-07-29 NOTE — ED Triage Notes (Signed)
Pt presents to have urine rechecked after having a urinary tract infection. Reports feeling better.   Reports she needs her home medications refilled. States her doctor's office closed. They had an appt today but the patient had to use the bathroom before leaving for the appt and was late.

## 2019-07-30 LAB — URINE CULTURE: Special Requests: NORMAL

## 2019-08-05 ENCOUNTER — Ambulatory Visit: Payer: Medicare Other | Admitting: Family Medicine

## 2019-08-10 ENCOUNTER — Ambulatory Visit: Payer: Medicare Other | Admitting: Podiatry

## 2019-08-15 ENCOUNTER — Emergency Department (HOSPITAL_COMMUNITY): Payer: Medicare Other

## 2019-08-15 ENCOUNTER — Emergency Department (HOSPITAL_COMMUNITY)
Admission: EM | Admit: 2019-08-15 | Discharge: 2019-08-15 | Disposition: A | Discharge status: Home / Self Care | Payer: Medicare Other | Attending: Emergency Medicine | Admitting: Emergency Medicine

## 2019-08-15 ENCOUNTER — Encounter (HOSPITAL_COMMUNITY): Payer: Self-pay

## 2019-08-15 ENCOUNTER — Other Ambulatory Visit: Payer: Self-pay

## 2019-08-15 DIAGNOSIS — M25552 Pain in left hip: Secondary | ICD-10-CM | POA: Diagnosis not present

## 2019-08-15 DIAGNOSIS — R1030 Lower abdominal pain, unspecified: Secondary | ICD-10-CM | POA: Insufficient documentation

## 2019-08-15 DIAGNOSIS — K5732 Diverticulitis of large intestine without perforation or abscess without bleeding: Secondary | ICD-10-CM | POA: Insufficient documentation

## 2019-08-15 DIAGNOSIS — N3000 Acute cystitis without hematuria: Secondary | ICD-10-CM | POA: Diagnosis not present

## 2019-08-15 DIAGNOSIS — Z7901 Long term (current) use of anticoagulants: Secondary | ICD-10-CM | POA: Insufficient documentation

## 2019-08-15 DIAGNOSIS — K5792 Diverticulitis of intestine, part unspecified, without perforation or abscess without bleeding: Secondary | ICD-10-CM

## 2019-08-15 DIAGNOSIS — F0390 Unspecified dementia without behavioral disturbance: Secondary | ICD-10-CM | POA: Insufficient documentation

## 2019-08-15 DIAGNOSIS — I482 Chronic atrial fibrillation, unspecified: Secondary | ICD-10-CM | POA: Insufficient documentation

## 2019-08-15 DIAGNOSIS — K625 Hemorrhage of anus and rectum: Secondary | ICD-10-CM | POA: Diagnosis present

## 2019-08-15 LAB — COMPREHENSIVE METABOLIC PANEL
ALT: 18 U/L (ref 0–44)
AST: 17 U/L (ref 15–41)
Albumin: 3.7 g/dL (ref 3.5–5.0)
Alkaline Phosphatase: 67 U/L (ref 38–126)
Anion gap: 8 (ref 5–15)
BUN: 18 mg/dL (ref 8–23)
CO2: 29 mmol/L (ref 22–32)
Calcium: 9.2 mg/dL (ref 8.9–10.3)
Chloride: 101 mmol/L (ref 98–111)
Creatinine, Ser: 0.91 mg/dL (ref 0.44–1.00)
GFR calc Af Amer: >60 mL/min (ref >60)
GFR calc non Af Amer: 55 mL/min — ABNORMAL LOW (ref >60)
Glucose, Bld: 93 mg/dL (ref 70–99)
Potassium: 4.5 mmol/L (ref 3.5–5.1)
Sodium: 138 mmol/L (ref 135–145)
Total Bilirubin: 0.8 mg/dL (ref 0.3–1.2)
Total Protein: 6.7 g/dL (ref 6.5–8.1)

## 2019-08-15 LAB — URINALYSIS, ROUTINE W REFLEX MICROSCOPIC
Bacteria, UA: NONE SEEN
Bilirubin Urine: NEGATIVE
Glucose, UA: NEGATIVE mg/dL
Ketones, ur: NEGATIVE mg/dL
Nitrite: POSITIVE — AB
Protein, ur: NEGATIVE mg/dL
Specific Gravity, Urine: 1.029 (ref 1.005–1.030)
pH: 7 (ref 5.0–8.0)

## 2019-08-15 LAB — CBC WITH DIFFERENTIAL/PLATELET
Abs Immature Granulocytes: 0.06 10*3/uL (ref 0.00–0.07)
Basophils Absolute: 0 10*3/uL (ref 0.0–0.1)
Basophils Relative: 1 %
Eosinophils Absolute: 0.1 10*3/uL (ref 0.0–0.5)
Eosinophils Relative: 1 %
HCT: 40.4 % (ref 36.0–46.0)
Hemoglobin: 13 g/dL (ref 12.0–15.0)
Immature Granulocytes: 1 %
Lymphocytes Relative: 19 %
Lymphs Abs: 1.2 10*3/uL (ref 0.7–4.0)
MCH: 33.9 pg (ref 26.0–34.0)
MCHC: 32.2 g/dL (ref 30.0–36.0)
MCV: 105.2 fL — ABNORMAL HIGH (ref 80.0–100.0)
Monocytes Absolute: 0.6 10*3/uL (ref 0.1–1.0)
Monocytes Relative: 9 %
Neutro Abs: 4.6 10*3/uL (ref 1.7–7.7)
Neutrophils Relative %: 69 %
Platelets: 156 10*3/uL (ref 150–400)
RBC: 3.84 MIL/uL — ABNORMAL LOW (ref 3.87–5.11)
RDW: 14.1 % (ref 11.5–15.5)
WBC: 6.6 10*3/uL (ref 4.0–10.5)
nRBC: 0 % (ref 0.0–0.2)

## 2019-08-15 LAB — LIPASE, BLOOD: Lipase: 17 U/L (ref 11–51)

## 2019-08-15 MED ORDER — CIPROFLOXACIN HCL 250 MG PO TABS
500.0000 mg | ORAL_TABLET | Freq: Once | ORAL | Status: AC
  Administered 2019-08-15: 500 mg via ORAL
  Filled 2019-08-15: qty 2

## 2019-08-15 MED ORDER — METRONIDAZOLE 500 MG PO TABS: 500.0000 mg | ORAL_TABLET | Freq: Three times a day (TID) | ORAL | 0 refills | Status: AC

## 2019-08-15 MED ORDER — METRONIDAZOLE 500 MG PO TABS
500.0000 mg | ORAL_TABLET | Freq: Once | ORAL | Status: AC
  Administered 2019-08-15: 500 mg via ORAL
  Filled 2019-08-15: qty 1

## 2019-08-15 MED ORDER — CIPROFLOXACIN HCL 500 MG PO TABS: 500.0000 mg | ORAL_TABLET | Freq: Two times a day (BID) | ORAL | 0 refills | Status: DC

## 2019-08-15 MED ORDER — IOHEXOL 300 MG/ML  SOLN
100.0000 mL | Freq: Once | INTRAMUSCULAR | Status: AC | PRN
  Administered 2019-08-15: 100 mL via INTRAVENOUS

## 2019-08-15 NOTE — ED Notes (Signed)
Call for [facility] to transport pt

## 2019-08-15 NOTE — Discharge Instructions (Signed)
Work-up here shows early signs of diverticulitis.  She also has evidence of urinary tract infection.  Urine sent for culture.  The treatment with Cipro and Flagyl should be adequate to take care of both.  She was Hemoccult negative here.  CT scan of the abdomen showed no pelvic fractures or any hip fractures but she does have severe bilateral degenerative changes in both hips.  Which could explain pain.  Patient stable for discharge back to nursing facility.  Prescriptions for Cipro and Flagyl provided.  Needs to take these for the next 7 days.  Also important to follow-up with the urine culture.

## 2019-08-15 NOTE — ED Notes (Signed)
Called EMS for transport back to [facility]

## 2019-08-15 NOTE — ED Notes (Signed)
Per administrator at facility pt has complained of lower abd pain for about 24 hours , hip pain has been "on-going" but has worsened recently. Contact person at facility is Alcus Dad [phone]

## 2019-08-15 NOTE — ED Triage Notes (Signed)
Pt brought in via [facility] from [facility] c/o abdominal pain with dark red bleeding from hemorrhoid per facility and also left hip pain in which inhibits pt from walking. Recently treated for UTI.

## 2019-08-15 NOTE — ED Notes (Signed)
Cancelled EMS pick-up.  Staff is on the way to get the Pt.

## 2019-08-15 NOTE — ED Provider Notes (Signed)
[facility] EMERGENCY DEPARTMENT Provider Note   CSN: [id number] Arrival date & time: 08/15/19  1023     History Chief Complaint  Patient presents with  . Abdominal Pain    [name] is a 86 y.o. female.  Patient sent in from nursing facility with a complaint abdominal pain dark red blood from the rectum left hip pain making it difficulty work.  They said she was recently treated for urinary tract infection.  Patient has significant dementia although alert and very talkative.  Just has significant memory problems.  Patient denies any complaints here.        Past Medical History:  Diagnosis Date  . Arthritis   . Bilateral chronic knee pain   . Brain aneurysm   . COPD (chronic obstructive pulmonary disease) (HCC)   . DDD (degenerative disc disease), lumbar   . Dementia (HCC)   . DJD (degenerative joint disease)   . DVT (deep venous thrombosis) (HCC)   . Gout   . Peripheral neuropathy   . Permanent atrial fibrillation [facility])    MDT [id number] pacemaker 03-26-2013 by Dr Ladona Ridgel  . Pulmonary embolism [facility])     Patient Active Problem List   Diagnosis Date Noted  . Keratotic lesion 07/22/2019  . Hypokalemia 12/05/2016  . Dementia with behavioral disturbance (HCC) 04/25/2016  . Noncompliance with medication regimen 04/24/2016  . UTI (urinary tract infection) 04/18/2016  . Chest pain 02/10/2014  . Edema 02/10/2014  . Encounter for therapeutic drug monitoring 08/13/2013  . Chronic anticoagulation 07/22/2010  . HYPOTENSION, ORTHOSTATIC 06/15/2010  . Essential hypertension, benign 04/06/2009  . Cardiac pacemaker in situ 04/06/2009  . PULMONARY EMBOLISM 01/18/2009  . ATRIAL FIBRILLATION, CHRONIC 01/18/2009  . BRADYCARDIA-TACHYCARDIA SYNDROME 01/18/2009  . DVT 01/18/2009    Past Surgical History:  Procedure Laterality Date  . CARDIAC CATHETERIZATION  2001  . Craniotomy clipping  06-2002   unruptured  . Left paraclinoid segment anurysum     [facility]  . PACEMAKER INSERTION  09-13-1999; 03-26-2013   PPM gen change by Dr Ladona Ridgel 03-26-2013 - MDT [id number]  . PERMANENT PACEMAKER GENERATOR CHANGE N/A 03/26/2013   Procedure: PERMANENT PACEMAKER GENERATOR CHANGE;  Surgeon: Marinus Maw, MD;  Location: [facility] CATH LAB;  Service: Cardiovascular;  Laterality: N/A;     OB History   No obstetric history on file.     Family History  Problem Relation Age of Onset  . Heart attack Mother   . Heart attack Father   . Alzheimer's disease Sister     Social History   Tobacco Use  . Smoking status: Former Smoker    Packs/day: 0.25    Years: 35.00    Pack years: 8.75    Types: Cigarettes    Start date: 12/27/1977    Quit date: 05/26/2010    Years since quitting: 9.2  . Smokeless tobacco: Never Used  Substance Use Topics  . Alcohol use: No    Alcohol/week: 0.0 standard drinks  . Drug use: No    Home Medications Prior to Admission medications   Medication Sig Start Date End Date Taking? Authorizing Provider  allopurinol (ZYLOPRIM) 300 MG tablet Take 1 tablet (300 mg total) by mouth daily. 07/29/19  Yes Wurst, [place], PA-C  apixaban (ELIQUIS) 5 MG TABS tablet Take 1 tablet (5 mg total) by mouth 2 (two) times daily. 07/29/19  Yes Wurst, [place], PA-C  digoxin (LANOXIN) 0.125 MG tablet Take 0.5 tablets (0.0625 mg total) by mouth daily. 07/29/19  Yes Wurst, [place], PA-C  diltiazem (CARDIZEM CD) 240 MG 24 hr capsule Take 1 capsule (240 mg total) by mouth daily. 07/29/19  Yes Wurst, [place], PA-C  donepezil (ARICEPT) 5 MG tablet Take 1 tablet (5 mg total) by mouth at bedtime. 07/29/19  Yes Wurst, [place], PA-C  metoprolol succinate (TOPROL-XL) 100 MG 24 hr tablet Take 1 tablet (100 mg total) by mouth daily. Take with or immediately following a meal. 07/29/19  Yes Wurst, [place], PA-C  ciprofloxacin (CIPRO) 500 MG tablet Take 1 tablet (500 mg total) by mouth 2 (two) times daily. 08/15/19   Vanetta Mulders, MD  metroNIDAZOLE (FLAGYL) 500 MG  tablet Take 1 tablet (500 mg total) by mouth 3 (three) times daily for 7 days. 08/15/19 08/22/19  Vanetta Mulders, MD  ipratropium (ATROVENT) 0.06 % nasal spray Place 2 sprays into both nostrils 2 (two) times daily as needed for rhinitis.  07/29/19  [provider]  sodium chloride (OCEAN) 0.65 % SOLN nasal spray Place 2 sprays into both nostrils 3 (three) times daily as needed for congestion.  07/29/19  [provider]    Allergies    Bee venom and Codeine  Review of Systems   Review of Systems  Unable to perform ROS: Dementia    Physical Exam Updated Vital Signs BP 130/87   Pulse 61   Temp 97.9 F (36.6 C) (Oral)   Resp 12   Ht 1.651 m (5\' 5" )   Wt 77.1 kg   SpO2 93%   BMI 28.29 kg/m   Physical Exam Vitals and nursing note reviewed.  Constitutional:      General: She is not in acute distress.    Appearance: Normal appearance. She is well-developed.  HENT:     Head: Normocephalic and atraumatic.  Eyes:     Extraocular Movements: Extraocular movements intact.     Conjunctiva/sclera: Conjunctivae normal.     Pupils: Pupils are equal, round, and reactive to light.  Cardiovascular:     Rate and Rhythm: Normal rate and regular rhythm.     Heart sounds: No murmur.  Pulmonary:     Effort: Pulmonary effort is normal. No respiratory distress.     Breath sounds: Normal breath sounds.  Abdominal:     Palpations: Abdomen is soft.     Tenderness: There is no abdominal tenderness.  Genitourinary:    Rectum: Normal.     Comments: Patient has a lot of external skin tags from previous external hemorrhoids.  On rectal exam no gross bleeding.  No melena.  Hemoccult pending. Musculoskeletal:     Cervical back: Neck supple.     Comments: Patient moving both lower extremities in the bed without any difficulty.  Skin:    General: Skin is warm and dry.  Neurological:     Mental Status: She is alert. Mental status is at baseline.     Cranial Nerves: No cranial nerve  deficit.     Sensory: No sensory deficit.     Motor: No weakness.     Comments: Patient alert very talkative but definitely has memory problems.     ED Results / Procedures / Treatments   Labs (all labs ordered are listed, but only abnormal results are displayed) Labs Reviewed  COMPREHENSIVE METABOLIC PANEL - Abnormal; Notable for the following components:      Result Value   GFR calc non Af Amer 55 (*)    All other components within normal limits  CBC WITH DIFFERENTIAL/PLATELET - Abnormal; Notable for the  following components:   RBC 3.84 (*)    MCV 105.2 (*)    All other components within normal limits  URINALYSIS, ROUTINE W REFLEX MICROSCOPIC - Abnormal; Notable for the following components:   Hgb urine dipstick SMALL (*)    Nitrite POSITIVE (*)    Leukocytes,Ua MODERATE (*)    All other components within normal limits  URINE CULTURE  LIPASE, BLOOD  POC OCCULT BLOOD, ED    EKG EKG Interpretation  Date/Time:  Saturday August 15 2019 11:39:48 EDT Ventricular Rate:  60 PR Interval:    QRS Duration: 136 QT Interval:  446 QTC Calculation: 446 R Axis:   -75 Text Interpretation: Ventricular-paced rhythm Nonspecific IVCD with LAD LVH with secondary repolarization abnormality Probable inferior infarct, acute Anterolateral infarct, old No significant change since last tracing Confirmed by Vanetta Mulders [phone]) on 08/15/2019 11:47:24 AM   Radiology CT Abdomen Pelvis W Contrast  Result Date: 08/15/2019 CLINICAL DATA:  Lower abdominal pain EXAM: CT ABDOMEN AND PELVIS WITH CONTRAST TECHNIQUE: Multidetector CT imaging of the abdomen and pelvis was performed using the standard protocol following bolus administration of intravenous contrast. CONTRAST:  OMNIPAQUE IOHEXOL 300 MG/ML  SOLN COMPARISON:  CT 08/24/2010 FINDINGS: Lower chest: Cardiomegaly. No pericardial effusion. Hepatobiliary: Interval cholecystectomy. Pneumobilia is noted, which may be postoperative. Subcentimeter  low-density focus within the anterior left hepatic lobe (series 2, image 27), too small to definitively characterize. Punctate calcification within the anterior right hepatic lobe. Pancreas: Unremarkable. No pancreatic ductal dilatation or surrounding inflammatory changes. Spleen: Normal in size without focal abnormality. Adrenals/Urinary Tract: No adrenal nodule. Left pelvic kidney with areas of cortical scarring. Normally positioned right kidney. No focal renal lesion. Mild prominence of the right renal pelvis without hydronephrosis. Circumferential urinary bladder wall thickening, which may be accentuated by underdistention. Stomach/Bowel: Small hiatal hernia. No dilated loops of bowel. Extensive colonic diverticulosis. Subtle haziness within the pericolonic fat adjacent to the sigmoid colon could reflect early acute diverticulitis (series 6, image 46). No focal colonic thickening. Normal appendix in the right lower quadrant (series 6, image 31). Vascular/Lymphatic: Aortoiliac atherosclerosis. Distal abdominal aorta measures 2.6 cm (series 2, image 39), increased from prior. No abdominopelvic lymphadenopathy. Reproductive: Atrophic uterus. Prominence of the endometrial stripe. Small calcified uterine fibroid. No adnexal masses. Other: No free air, free fluid, or intra-abdominal fluid collection. No abdominal wall hernia. Musculoskeletal: Severe degenerative changes of the bilateral hips. Multilevel lumbar spondylosis. No acute osseous findings. IMPRESSION: 1. Extensive colonic diverticulosis with subtle haziness within the pericolonic fat adjacent to the sigmoid colon could reflect early acute diverticulitis. No evidence of perforation or abscess. 2. Circumferential urinary bladder wall thickening, which may be accentuated by underdistention. Correlate with urinalysis to exclude cystitis. 3. Interval cholecystectomy. Pneumobilia is noted, likely postoperative. 4. Atrophic uterus with prominence of the endometrial  stripe. Further evaluation with pelvic ultrasound is recommended. 5. Small hiatal hernia. 6. Aortic atherosclerosis (ICD10-I70.0). 7. Ectatic abdominal aorta at risk for aneurysm development. Recommend followup by ultrasound in 5 years. This recommendation follows ACR consensus guidelines: White Paper of the ACR Incidental Findings Committee II on Vascular Findings. J Am Coll Radiol 2013; 10:789-794. Aortic aneurysm NOS (ICD10-I71.9) Electronically Signed   By: Duanne Guess D.O.   On: 08/15/2019 14:32    Procedures Procedures (including critical care time)  Medications Ordered in ED Medications  iohexol (OMNIPAQUE) 300 MG/ML solution 100 mL (100 mLs Intravenous Contrast Given 08/15/19 1328)  ciprofloxacin (CIPRO) tablet 500 mg (500 mg Oral Given 08/15/19 1539)  metroNIDAZOLE (  FLAGYL) tablet 500 mg (500 mg Oral Given 08/15/19 1536)    ED Course  I have reviewed the triage vital signs and the nursing notes.  Pertinent labs & imaging results that were available during my care of the patient were reviewed by me and considered in my medical decision making (see chart for details).    MDM Rules/Calculators/A&P                      Work-up here shows no significant decrease in hemoglobin.  Rectal exam without any gross blood or melena.  CT of abdomen raise concerns for early diverticulitis.  Will treat with Cipro and Flagyl for that.  Also urinalysis still appears to be consistent with urinary tract infection.  Sent for culture.  Not able to confirm which antibiotic patient's been on.  But if she was not on Cipro treatment for the diverticulitis will cover as well.  In addition CT showed marked degenerative changes in both hips however there was no pelvic fracture or hip fracture.  The degenerative changes certainly could explain hip pain.  Patient will be discharged back to nursing facility with a 1 week prescription for Cipro and Flagyl.  Urine culture is pending and will need to be followed  up.   Final Clinical Impression(s) / ED Diagnoses Final diagnoses:  Diverticulitis  Acute cystitis without hematuria    Rx / DC Orders ED Discharge Orders         Ordered    ciprofloxacin (CIPRO) 500 MG tablet  2 times daily     08/15/19 1522    metroNIDAZOLE (FLAGYL) 500 MG tablet  3 times daily     08/15/19 1522           Fredia Sorrow, MD 08/15/19 1556

## 2019-08-18 LAB — URINE CULTURE: Culture: >=100000 — AB

## 2019-08-19 ENCOUNTER — Telehealth: Payer: Self-pay

## 2019-08-19 NOTE — Telephone Encounter (Signed)
Re: [facility] ED 08/15/19:  Spoke to adm of facility [employer].  Still on abx and Metronidazole.  PT has F/U with PCP next week  Encouraged to get UA/UC if any continued problems

## 2019-08-27 ENCOUNTER — Ambulatory Visit (INDEPENDENT_AMBULATORY_CARE_PROVIDER_SITE_OTHER): Payer: Medicare Other | Admitting: Family Medicine

## 2019-08-27 ENCOUNTER — Other Ambulatory Visit: Payer: Self-pay

## 2019-08-27 VITALS — BP 117/69 | HR 61 | Temp 98.0°F | Wt 183.8 lb

## 2019-08-27 DIAGNOSIS — I4891 Unspecified atrial fibrillation: Secondary | ICD-10-CM

## 2019-08-27 DIAGNOSIS — Z9181 History of falling: Secondary | ICD-10-CM

## 2019-08-27 DIAGNOSIS — I10 Essential (primary) hypertension: Secondary | ICD-10-CM

## 2019-08-27 DIAGNOSIS — F0280 Dementia in other diseases classified elsewhere without behavioral disturbance: Secondary | ICD-10-CM

## 2019-08-27 DIAGNOSIS — N390 Urinary tract infection, site not specified: Secondary | ICD-10-CM

## 2019-08-27 DIAGNOSIS — L570 Actinic keratosis: Secondary | ICD-10-CM | POA: Diagnosis not present

## 2019-08-27 DIAGNOSIS — G309 Alzheimer's disease, unspecified: Secondary | ICD-10-CM

## 2019-08-27 LAB — POCT URINALYSIS DIP (MANUAL ENTRY)
Bilirubin, UA: NEGATIVE
Glucose, UA: NEGATIVE mg/dL
Ketones, POC UA: NEGATIVE mg/dL
Leukocytes, UA: NEGATIVE
Nitrite, UA: NEGATIVE
Protein Ur, POC: NEGATIVE mg/dL
Spec Grav, UA: >=1.03 — AB (ref 1.010–1.025)
Urobilinogen, UA: 0.2 E.U./dL
pH, UA: 6 (ref 5.0–8.0)

## 2019-08-27 MED ORDER — NITROFURANTOIN MONOHYD MACRO 100 MG PO CAPS: 100.0000 mg | ORAL_CAPSULE | Freq: Two times a day (BID) | ORAL | 0 refills | Status: DC

## 2019-08-27 NOTE — Progress Notes (Signed)
New Patient Office Visit  Subjective:  Patient ID: [name], female    DOB: [birth year]  Age: 86 y.o. MRN: [id number]  CC:  Chief Complaint  Patient presents with  . Back Pain    last seen on 07/22/19 for Uti. here today with lower back pain.  . Hip Pain    left hip keeps given patient a fit. Sore and feels like it want bend. Done Ct scan in the ER 07/29/19 want to know if it is better to [place] a walker or a cane and would like to be referred for PT  IMPRESSION: CTabdomen-ER-Diverticulitis 1. Extensive colonic diverticulosis with subtle haziness within the pericolonic fat adjacent to the sigmoid colon could reflect early acute diverticulitis. No evidence of perforation or abscess. 2. Circumferential urinary bladder wall thickening, which may be accentuated by underdistention. Correlate with urinalysis to exclude cystitis. 3. Interval cholecystectomy. Pneumobilia is noted, likely postoperative.  4. Atrophic uterus with prominence of the endometrial stripe. Further evaluation with pelvic ultrasound is recommended. 5. Small hiatal hernia. 6. Aortic atherosclerosis (ICD10-I70.0). Antibiotics completed Cardiology evaluation-12/20-afib/h/o heart block, PPM Pacemaker check in clinic. Normal device function. Thresholds, sensing, impedances consistent with previous measurements. Single lead, no atrial data. No HVR. Device programmed at appropriate safety margins. Histogram distribution appropriate for patient activity level. Estimated longevity 2.5 yrs. Pt in facility and not remote-capable.  HPI AMBERLE LYTER presents for urinary symptoms Imodium given q 3-4 days-diarrhea noted -no blood in stool-recent ER for diverticulitis UTI-previously treated-recheck today Dementia-MMS 14 today-assistance in group home with ADL, urinary incontinence Fall risk-uses cane-concern for walker needed for stability-needs PT Neurologist-Aricept daily Gout-no recent flares Past Medical History:   Diagnosis Date  . Arthritis   . Bilateral chronic knee pain   . Brain aneurysm   . COPD (chronic obstructive pulmonary disease) ([place])   . DDD (degenerative disc disease), lumbar   . Dementia ([place])   . DJD (degenerative joint disease)   . DVT (deep venous thrombosis) ([place])   . Gout   . Peripheral neuropathy   . Permanent atrial fibrillation [facility])    MDT [id number] pacemaker 03-26-2013 by Dr Lovena Le  . Pulmonary embolism [facility])     Past Surgical History:  Procedure Laterality Date  . CARDIAC CATHETERIZATION  2001  . Craniotomy clipping  06-2002   unruptured  . Left paraclinoid segment anurysum     [facility]  . PACEMAKER INSERTION  09-13-1999; 03-26-2013   PPM gen change by Dr Lovena Le 03-26-2013 - MDT [id number]  . PERMANENT PACEMAKER GENERATOR CHANGE N/A 03/26/2013   Procedure: PERMANENT PACEMAKER GENERATOR CHANGE;  Surgeon: Evans Lance, MD;  Location: [facility] CATH LAB;  Service: Cardiovascular;  Laterality: N/A;    Family History  Problem Relation Age of Onset  . Heart attack Mother   . Heart attack Father   . Alzheimer's disease Sister     Social History   Socioeconomic History  . Marital status: Widowed    Spouse name: Not on file  . Number of children: Not on file  . Years of education: Not on file  . Highest education level: Not on file  Occupational History  . Occupation: retired from the telephone company  Tobacco Use  . Smoking status: Former Smoker    Packs/day: 0.25    Years: 35.00    Pack years: 8.75    Types: Cigarettes    Start date: 12/27/1977    Quit date: 05/26/2010    Years since quitting:  9.2  . Smokeless tobacco: Never Used  Substance and Sexual Activity  . Alcohol use: No    Alcohol/week: 0.0 standard drinks  . Drug use: No  . Sexual activity: Not on file  Other Topics Concern  . Not on file  Social History Narrative  . Not on file   Social Determinants of Health   Financial Resource Strain:   . Difficulty of Paying  Living Expenses:   Food Insecurity:   . Worried About Programme researcher, broadcasting/film/video in the Last Year:   . Barista in the Last Year:   Transportation Needs:   . Freight forwarder (Medical):   Marland Kitchen Lack of Transportation (Non-Medical):   Physical Activity:   . Days of Exercise per Week:   . Minutes of Exercise per Session:   Stress:   . Feeling of Stress :   Social Connections:   . Frequency of Communication with Friends and Family:   . Frequency of Social Gatherings with Friends and Family:   . Attends Religious Services:   . Active Member of Clubs or Organizations:   . Attends Banker Meetings:   Marland Kitchen Marital Status:   Intimate Partner Violence:   . Fear of Current or Ex-Partner:   . Emotionally Abused:   Marland Kitchen Physically Abused:   . Sexually Abused:     ROS Review of Systems  HENT: Positive for congestion.   Respiratory: Negative.   Cardiovascular: Positive for leg swelling. Negative for chest pain.  Gastrointestinal: Positive for abdominal pain and diarrhea. Negative for nausea and vomiting.  Genitourinary: Positive for dysuria, frequency and urgency.  Musculoskeletal: Positive for arthralgias.  Skin:       Chest -raised lesion with associated eryth-sees derm next week  Neurological: Positive for weakness. Negative for dizziness and headaches.  Psychiatric/Behavioral: Positive for confusion and decreased concentration.    Objective:   Today's Vitals: BP 117/69 (BP Location: Right Arm, Patient Position: Sitting, Cuff Size: Large)   Pulse 61   Temp 98 F (36.7 C) (Temporal)   Wt 183 lb 12.8 oz (83.4 kg)   SpO2 98%   BMI 30.59 kg/m   Physical Exam Constitutional:      Appearance: Normal appearance.  Eyes:     Conjunctiva/sclera: Conjunctivae normal.  Cardiovascular:     Rate and Rhythm: Normal rate and regular rhythm.     Pulses: Normal pulses.     Heart sounds: Normal heart sounds.  Pulmonary:     Effort: Pulmonary effort is normal.     Breath  sounds: Normal breath sounds.  Musculoskeletal:        General: No swelling.     Cervical back: Normal range of motion and neck supple.     Right lower leg: Edema present.     Left lower leg: Edema present.  Skin:    Findings: Lesion present.     Comments: Chest wall-raised with surrounding macule eryth  Neurological:     Mental Status: She is alert and oriented to person, place, and time.  Psychiatric:        Mood and Affect: Mood normal.        Behavior: Behavior normal.     Assessment & Plan:   Problem List Items Addressed This Visit      Genitourinary   UTI (urinary tract infection) - Primary   Relevant Orders   POCT urinalysis dipstick (Completed)      Outpatient Encounter Medications as of 08/27/2019  Medication Sig  .  allopurinol (ZYLOPRIM) 300 MG tablet Take 1 tablet (300 mg total) by mouth daily.  Marland Kitchen apixaban (ELIQUIS) 5 MG TABS tablet Take 1 tablet (5 mg total) by mouth 2 (two) times daily.  . ciprofloxacin (CIPRO) 500 MG tablet Take 1 tablet (500 mg total) by mouth 2 (two) times daily.  . digoxin (LANOXIN) 0.125 MG tablet Take 0.5 tablets (0.0625 mg total) by mouth daily.  Marland Kitchen diltiazem (CARDIZEM CD) 240 MG 24 hr capsule Take 1 capsule (240 mg total) by mouth daily.  Marland Kitchen donepezil (ARICEPT) 5 MG tablet Take 1 tablet (5 mg total) by mouth at bedtime.  . metoprolol succinate (TOPROL-XL) 100 MG 24 hr tablet Take 1 tablet (100 mg total) by mouth daily. Take with or immediately following a meal.  . [DISCONTINUED] ipratropium (ATROVENT) 0.06 % nasal spray Place 2 sprays into both nostrils 2 (two) times daily as needed for rhinitis.  . [DISCONTINUED] sodium chloride (OCEAN) 0.65 % SOLN nasal spray Place 2 sprays into both nostrils 3 (three) times daily as needed for congestion.   No facility-administered encounter medications on file as of 08/27/2019.   1. Urinary tract infection without hematuria, site unspecified - POCT urinalysis dipstick - Urine Culture-macrobid-reviewed  prior urine culture  2. Alzheimer's dementia without behavioral disturbance, unspecified timing of dementia onset (HCC) MMS 14 on Aricept-no longer seeing neurology - Ambulatory referral to Home Health  3. At high risk for injury related to fall Using cane-may need walker for safety - Ambulatory referral to Home Health  4. Keratotic lesion Derm appt next week-pt picking and irritating lesions  5. Essential hypertension, benign Stable-metoprolol  6. Atrial fibrillation, unspecified type (HCC) Dig, cardizem/metoprolol/eliquis-sees cardiology Follow-up: derm next week, PT to assist with assessment for walker for stability 50 min spent with caregiver and patient-MMS , review of history, paperwork completion for group home-medication review, ADL review, physical exam, assessment, plan Yanina Knupp Mat Carne, MD

## 2019-08-27 NOTE — Patient Instructions (Addendum)
   PT referral Pick up macrobid for UTI If you have lab work done today you will be contacted with your lab results within the next 2 weeks.  If you have not heard from [place] then please contact us. The fastest way to get your results is to register for My Chart.   IF you received an x-ray today, you will receive an invoice from [facility]. Please contact [facility] at [phone] with questions or concerns regarding your invoice.   IF you received labwork today, you will receive an invoice from [facility]. Please contact [facility] at [phone] with questions or concerns regarding your invoice.   Our billing staff will not be able to assist you with questions regarding bills from these companies.  You will be contacted with the lab results as soon as they are available. The fastest way to get your results is to activate your My Chart account. Instructions are located on the last page of this paperwork. If you have not heard from [place] regarding the results in 2 weeks, please contact this office.

## 2019-08-28 LAB — URINE CULTURE
MICRO NUMBER:: 10394716
SPECIMEN QUALITY:: ADEQUATE

## 2019-08-31 ENCOUNTER — Telehealth: Payer: Self-pay | Admitting: Family Medicine

## 2019-08-31 NOTE — Telephone Encounter (Signed)
Please advise 

## 2019-08-31 NOTE — Telephone Encounter (Signed)
Patient care giver Nolon Nations is calling and states that she has questions for the clinical staff. States last couple of days patient has had chest congestion but it is not tight. She has also been very thirsty but her blood glucose was normal. She state she has PRN tussin. Requesting call back.  [phone]

## 2019-09-02 ENCOUNTER — Other Ambulatory Visit: Payer: Self-pay | Admitting: Emergency Medicine

## 2019-09-02 DIAGNOSIS — R0989 Other specified symptoms and signs involving the circulatory and respiratory systems: Secondary | ICD-10-CM

## 2019-09-02 MED ORDER — DM-GUAIFENESIN ER 30-600 MG PO TB12: 1.0000 | ORAL_TABLET | Freq: Two times a day (BID) | ORAL | 0 refills | Status: AC

## 2019-09-02 NOTE — Telephone Encounter (Signed)
Patient caregiver is calling back and states no one ever reached out to her. She is requesting a call back. Please contact, [phone]

## 2019-09-02 NOTE — Telephone Encounter (Signed)
Please advise 

## 2019-09-02 NOTE — Telephone Encounter (Signed)
Mucinex DM-12 hour Office visit if worsening symptoms, cough, congestion, fever

## 2019-09-02 NOTE — Telephone Encounter (Signed)
Rx has been sent in and caregiver was informed

## 2019-09-03 ENCOUNTER — Telehealth: Payer: Self-pay | Admitting: Family Medicine

## 2019-09-03 NOTE — Telephone Encounter (Signed)
Referral hs been changed and they will contact the patient with appt time and date

## 2019-09-03 NOTE — Telephone Encounter (Signed)
Patient caregiver Nadara Mode calling to see if the [facility] for Physical Therapy referral was placed as discussed. She is wondering as she has not heard from anyone from [facility]. [phone]

## 2019-09-17 ENCOUNTER — Telehealth: Payer: Self-pay

## 2019-09-17 NOTE — Telephone Encounter (Signed)
Please see message and call them this is [place] pt

## 2019-09-17 NOTE — Telephone Encounter (Signed)
Please call Lupita Leash at [facility] regarding Medication

## 2019-09-24 ENCOUNTER — Telehealth: Payer: Self-pay | Admitting: Family Medicine

## 2019-09-24 NOTE — Telephone Encounter (Signed)
Informed her Dr. Judee Clara was out and would be back in office on Monday. Requesting call back

## 2019-09-24 NOTE — Telephone Encounter (Signed)
Patient caregiver called and wants to bring a urine specimen by the office or the lab without bringing the patient in to see if she has an infection.

## 2019-09-24 NOTE — Telephone Encounter (Signed)
Spoke with Vernell, advised that Dr. Judee Clara was not in the office again until Monday, to seek care at Urgent Care or ED.

## 2019-09-28 ENCOUNTER — Telehealth: Payer: Self-pay | Admitting: Family Medicine

## 2019-09-28 DIAGNOSIS — I10 Essential (primary) hypertension: Secondary | ICD-10-CM

## 2019-09-28 DIAGNOSIS — I4891 Unspecified atrial fibrillation: Secondary | ICD-10-CM

## 2019-09-28 MED ORDER — METOPROLOL SUCCINATE ER 100 MG PO TB24: 100.0000 mg | ORAL_TABLET | Freq: Every day | ORAL | 0 refills | Status: DC

## 2019-09-28 MED ORDER — APIXABAN 5 MG PO TABS: 5.0000 mg | ORAL_TABLET | Freq: Two times a day (BID) | ORAL | 0 refills | Status: DC

## 2019-09-28 NOTE — Telephone Encounter (Signed)
Ok to refill #90-no r/f

## 2019-09-28 NOTE — Telephone Encounter (Signed)
[name] with [facility] is calling and requesting refill on the follow medications.  metoprolol succinate (TOPROL-XL) 100 MG 24 hr tablet   apixaban (ELIQUIS) 5 MG TABS tablet    [facility] - [place], [place] - [address] Phone:  [phone]  Fax:  [phone]

## 2019-09-28 NOTE — Telephone Encounter (Signed)
Do you fill these medications or does Cardiology?

## 2019-10-06 ENCOUNTER — Encounter: Payer: Self-pay | Admitting: Family Medicine

## 2019-10-06 ENCOUNTER — Ambulatory Visit (INDEPENDENT_AMBULATORY_CARE_PROVIDER_SITE_OTHER): Payer: Medicare Other | Admitting: Family Medicine

## 2019-10-06 VITALS — BP 109/67 | HR 60 | Temp 98.0°F | Wt 174.2 lb

## 2019-10-06 DIAGNOSIS — N390 Urinary tract infection, site not specified: Secondary | ICD-10-CM | POA: Diagnosis not present

## 2019-10-06 DIAGNOSIS — L570 Actinic keratosis: Secondary | ICD-10-CM

## 2019-10-06 DIAGNOSIS — K649 Unspecified hemorrhoids: Secondary | ICD-10-CM

## 2019-10-06 NOTE — Patient Instructions (Signed)
Derm referral Drop off urine sample at [facility] Use hemorrhoid pillow when irritated

## 2019-10-06 NOTE — Progress Notes (Signed)
Established Patient Office Visit  Subjective:  Patient ID: [name], female    DOB: May 04, 1926  Age: 86 y.o. MRN: [id number]  CC:  Chief Complaint  Patient presents with  . Urinary Tract Infection    frequency, burning    HPI [name] presents for pain with urination, burning, odor noted by care giver to urine. H/o of UTI. Pt with hemorrhoids irritated from soft stools. Painful to sit. No blood in stool.  Irritation on chest that pt continues to pick.   Past Medical History:  Diagnosis Date  . Arthritis   . Bilateral chronic knee pain   . Brain aneurysm   . COPD (chronic obstructive pulmonary disease) (HCC)   . DDD (degenerative disc disease), lumbar   . Dementia (HCC)   . DJD (degenerative joint disease)   . DVT (deep venous thrombosis) (HCC)   . Gout   . Peripheral neuropathy   . Permanent atrial fibrillation [facility])    MDT [id number] pacemaker 03-26-2013 by Dr Ladona Ridgel  . Pulmonary embolism [facility])     Past Surgical History:  Procedure Laterality Date  . CARDIAC CATHETERIZATION  2001  . Craniotomy clipping  06-2002   unruptured  . Left paraclinoid segment anurysum     [facility]  . PACEMAKER INSERTION  09-13-1999; 03-26-2013   PPM gen change by Dr Ladona Ridgel 03-26-2013 - MDT [id number]  . PERMANENT PACEMAKER GENERATOR CHANGE N/A 03/26/2013   Procedure: PERMANENT PACEMAKER GENERATOR CHANGE;  Surgeon: Marinus Maw, MD;  Location: [facility] CATH LAB;  Service: Cardiovascular;  Laterality: N/A;    Family History  Problem Relation Age of Onset  . Heart attack Mother   . Heart attack Father   . Alzheimer's disease Sister     Social History   Socioeconomic History  . Marital status: Widowed    Spouse name: Not on file  . Number of children: Not on file  . Years of education: Not on file  . Highest education level: Not on file  Occupational History  . Occupation: retired from the telephone company  Tobacco Use  . Smoking status: Former  Smoker    Packs/day: 0.25    Years: 35.00    Pack years: 8.75    Types: Cigarettes    Start date: 12/27/1977    Quit date: 05/26/2010    Years since quitting: 9.3  . Smokeless tobacco: Never Used  Substance and Sexual Activity  . Alcohol use: No    Alcohol/week: 0.0 Cross drinks  . Drug use: No  . Sexual activity: Not on file  Other Topics Concern  . Not on file  Social History Narrative  . Not on file   Social Determinants of Health   Financial Resource Strain:   . Difficulty of Paying Living Expenses:   Food Insecurity:   . Worried About Programme researcher, broadcasting/film/video in the Last Year:   . Barista in the Last Year:   Transportation Needs:   . Freight forwarder (Medical):   Marland Kitchen Lack of Transportation (Non-Medical):   Physical Activity:   . Days of Exercise per Week:   . Minutes of Exercise per Session:   Stress:   . Feeling of Stress :   Social Connections:   . Frequency of Communication with Friends and Family:   . Frequency of Social Gatherings with Friends and Family:   . Attends Religious Services:   . Active Member of Clubs or Organizations:   .  Attends Archivist Meetings:   Marland Kitchen Marital Status:   Intimate Partner Violence:   . Fear of Current or Ex-Partner:   . Emotionally Abused:   Marland Kitchen Physically Abused:   . Sexually Abused:     Outpatient Medications Prior to Visit  Medication Sig Dispense Refill  . allopurinol (ZYLOPRIM) 300 MG tablet Take 1 tablet (300 mg total) by mouth daily. 30 tablet 0  . apixaban (ELIQUIS) 5 MG TABS tablet Take 1 tablet (5 mg total) by mouth 2 (two) times daily. 180 tablet 0  . Chlorpheniramine-Acetaminophen (CORICIDIN HBP COLD/FLU PO) Take 1 tablet by mouth 2 (two) times daily. As needed    . dextromethorphan 15 MG/5ML syrup Take 10 mLs by mouth 4 (four) times daily as needed for cough.    . digoxin (LANOXIN) 0.125 MG tablet Take 0.5 tablets (0.0625 mg total) by mouth daily. 30 tablet 0  . diltiazem (CARDIZEM CD) 240 MG  24 hr capsule Take 1 capsule (240 mg total) by mouth daily. 30 capsule 0  . donepezil (ARICEPT) 5 MG tablet Take 1 tablet (5 mg total) by mouth at bedtime. 30 tablet 0  . hydrocortisone (ANUSOL-HC) 2.5 % rectal cream Place 1 application rectally 2 (two) times daily. PRN    . metoprolol succinate (TOPROL-XL) 100 MG 24 hr tablet Take 1 tablet (100 mg total) by mouth daily. Take with or immediately following a meal. 90 tablet 0  . ciprofloxacin (CIPRO) 500 MG tablet Take 1 tablet (500 mg total) by mouth 2 (two) times daily. 14 tablet 0  . nitrofurantoin, macrocrystal-monohydrate, (MACROBID) 100 MG capsule Take 1 capsule (100 mg total) by mouth 2 (two) times daily. 10 capsule 0   No facility-administered medications prior to visit.    Allergies  Allergen Reactions  . Bee Venom Swelling  . Codeine     REACTION: GI distress    ROS Review of Systems  Respiratory: Negative.   Cardiovascular: Negative.   Gastrointestinal: Negative for abdominal pain, blood in stool and diarrhea.       Hemorrhoids-irritated Soft stools  Genitourinary: Positive for dysuria. Negative for difficulty urinating and hematuria.  Skin:       Irritation noted chest wall  Psychiatric/Behavioral:       Dementia      Objective:    Physical Exam  Constitutional: She is oriented to person, place, and time. She appears well-developed and well-nourished.  Cardiovascular: Normal rate, regular rhythm and normal heart sounds.  Pulmonary/Chest: Effort normal and breath sounds normal.  Abdominal: Soft. Bowel sounds are normal.  Genitourinary:       Genitourinary Comments: Hemorrhoids noted external and eryth, non thrombosed   Neurological: She is alert and oriented to person, place, and time.  Skin:     Skin lesion-chest wall-eryth associated    BP 109/67 (BP Location: Right Arm, Patient Position: Sitting)   Pulse 60   Temp 98 F (36.7 C) (Temporal)   Wt 174 lb 3.2 oz (79 kg)   SpO2 98%   BMI 28.99 kg/m    Wt Readings from Last 3 Encounters:  10/06/19 174 lb 3.2 oz (79 kg)  08/27/19 183 lb 12.8 oz (83.4 kg)  08/15/19 170 lb (77.1 kg)   Health Maintenance Due  Topic Date Due  . COVID-19 Vaccine (1) Never done  . TETANUS/TDAP  Never done  . DEXA SCAN  Never done  . PNA vac Low Risk Adult (2 of 2 - PCV13) 04/19/2017    Lab Results  Component  Value Date   TSH 3.099 09/28/2016   Lab Results  Component Value Date   WBC 6.6 08/15/2019   HGB 13.0 08/15/2019   HCT 40.4 08/15/2019   MCV 105.2 (H) 08/15/2019   PLT 156 08/15/2019   Lab Results  Component Value Date   NA 138 08/15/2019   K 4.5 08/15/2019   CO2 29 08/15/2019   GLUCOSE 93 08/15/2019   BUN 18 08/15/2019   CREATININE 0.91 08/15/2019   BILITOT 0.8 08/15/2019   ALKPHOS 67 08/15/2019   AST 17 08/15/2019   ALT 18 08/15/2019   PROT 6.7 08/15/2019   ALBUMIN 3.7 08/15/2019   CALCIUM 9.2 08/15/2019   ANIONGAP 8 08/15/2019   GFR 37.96 (L) 03/19/2013   Lab Results  Component Value Date   CHOL 173 09/28/2016   Lab Results  Component Value Date   HDL 60 09/28/2016   Lab Results  Component Value Date   LDLCALC 101 (H) 09/28/2016   Lab Results  Component Value Date   TRIG 58 09/28/2016   Lab Results  Component Value Date   CHOLHDL 2.9 09/28/2016     Assessment & Plan:   Problem List Items Addressed This Visit      Genitourinary   UTI (urinary tract infection) - Primary   Relevant Orders   POCT Urinalysis Dipstick   Urine Culture    1. Urinary tract infection without hematuria, site unspecified - POCT Urinalysis Dipstick - Urine Culture - Urinalysis 2. Hemorrhoids, unspecified hemorrhoid type - For home use only DME Other see comment Pt needs hemorrhoid pillow to decrease pressure on peri-anal area Recommended banding as pt uncomfortable-GI referral-care-giver to discuss with pts POA Sitz bath with shower chair recommended-care giver not aware if available in home 3. Keratotic lesion D/w pt care  given-removal to decrease irritation caused by picking - Ambulatory referral to Dermatology Follow-up: take urine sample to lab  Merrilee Ancona Mat Carne, MD

## 2019-10-08 ENCOUNTER — Telehealth: Payer: Self-pay | Admitting: Family Medicine

## 2019-10-08 NOTE — Telephone Encounter (Signed)
I will be checking through next week

## 2019-10-08 NOTE — Telephone Encounter (Signed)
I spoke with care giver to advise of the message.

## 2019-10-08 NOTE — Telephone Encounter (Signed)
Pt is asking who is going to call in medication for her if needed after her results come back.

## 2019-10-09 LAB — URINALYSIS
Bilirubin Urine: NEGATIVE
Glucose, UA: NEGATIVE
Nitrite: POSITIVE — AB
Specific Gravity, Urine: 1.019 (ref 1.001–1.03)
pH: 5.5 (ref 5.0–8.0)

## 2019-10-09 LAB — URINE CULTURE
MICRO NUMBER:: 10545648
SPECIMEN QUALITY:: ADEQUATE

## 2019-10-12 ENCOUNTER — Other Ambulatory Visit: Payer: Self-pay | Admitting: Family Medicine

## 2019-10-12 MED ORDER — SULFAMETHOXAZOLE-TRIMETHOPRIM 400-80 MG PO TABS: 1.0000 | ORAL_TABLET | Freq: Two times a day (BID) | ORAL | 0 refills | Status: DC

## 2019-10-21 ENCOUNTER — Other Ambulatory Visit: Payer: Self-pay | Admitting: Family Medicine

## 2019-10-28 ENCOUNTER — Telehealth: Payer: Self-pay | Admitting: Internal Medicine

## 2019-10-28 NOTE — Telephone Encounter (Signed)
[name] called and stated the patient needed a refill on her heart medication. She said you can call her if you have any questions [phone]

## 2019-10-30 ENCOUNTER — Other Ambulatory Visit: Payer: Self-pay

## 2019-10-30 ENCOUNTER — Other Ambulatory Visit: Payer: Self-pay | Admitting: Internal Medicine

## 2019-10-30 MED ORDER — DIGOXIN 125 MCG PO TABS: 0.0625 mg | ORAL_TABLET | Freq: Every day | ORAL | 1 refills | Status: DC

## 2019-10-30 NOTE — Telephone Encounter (Signed)
PLease call [facility] re: refills   [facility] [phone]   Thanks renee

## 2019-10-30 NOTE — Telephone Encounter (Signed)
Refilled digoxin to [facility]

## 2019-12-08 ENCOUNTER — Encounter: Payer: Self-pay | Admitting: Internal Medicine

## 2019-12-08 ENCOUNTER — Telehealth: Payer: Self-pay | Admitting: Internal Medicine

## 2019-12-08 ENCOUNTER — Other Ambulatory Visit: Payer: Self-pay

## 2019-12-08 ENCOUNTER — Ambulatory Visit (INDEPENDENT_AMBULATORY_CARE_PROVIDER_SITE_OTHER): Payer: Medicare Other | Admitting: Internal Medicine

## 2019-12-08 VITALS — BP 104/60 | HR 64 | Ht 69.0 in | Wt 174.4 lb

## 2019-12-08 DIAGNOSIS — Z950 Presence of cardiac pacemaker: Secondary | ICD-10-CM | POA: Diagnosis not present

## 2019-12-08 DIAGNOSIS — I482 Chronic atrial fibrillation, unspecified: Secondary | ICD-10-CM | POA: Diagnosis not present

## 2019-12-08 NOTE — Patient Instructions (Signed)
Medication Instructions:  Your physician recommends that you continue on your current medications as directed. Please refer to the Current Medication list given to you today.  *If you need a refill on your cardiac medications before your next appointment, please call your pharmacy*   Lab Work: NONE   If you have labs (blood work) drawn today and your tests are completely normal, you will receive your results only by: . MyChart Message (if you have MyChart) OR . A paper copy in the mail If you have any lab test that is abnormal or we need to change your treatment, we will call you to review the results.   Testing/Procedures: NONE    Follow-Up: At [facility], you and your health needs are our priority.  As part of our continuing mission to provide you with exceptional heart care, we have created designated Provider Care Teams.  These Care Teams include your primary Cardiologist (physician) and Advanced Practice Providers (APPs -  Physician Assistants and Nurse Practitioners) who all work together to provide you with the care you need, when you need it.  We recommend signing up for the patient portal called "MyChart".  Sign up information is provided on this After Visit Summary.  MyChart is used to connect with patients for Virtual Visits (Telemedicine).  Patients are able to view lab/test results, encounter notes, upcoming appointments, etc.  Non-urgent messages can be sent to your provider as well.   To learn more about what you can do with MyChart, go to [email or web address].    Your next appointment:   1 year(s)  The format for your next appointment:   In Person  Provider:   Gregg Taylor, MD   Other Instructions Thank you for choosing [facility]!    

## 2019-12-08 NOTE — Progress Notes (Signed)
HPI [name] returns today for followup. She is a pleasant 86 yo woman with a h/o CHB, s/p PPM, 6 years ago. In the interim, she notes no chest pain or sob or edema. She denies falls. She notes that her activity is not quite what it once was. Allergies  Allergen Reactions  . Bee Venom Swelling  . Codeine     REACTION: GI distress     Current Outpatient Medications  Medication Sig Dispense Refill  . allopurinol (ZYLOPRIM) 300 MG tablet Take 1 tablet (300 mg total) by mouth daily. 30 tablet 0  . apixaban (ELIQUIS) 5 MG TABS tablet Take 1 tablet (5 mg total) by mouth 2 (two) times daily. 180 tablet 0  . Chlorpheniramine-Acetaminophen (CORICIDIN HBP COLD/FLU PO) Take 1 tablet by mouth 2 (two) times daily. As needed    . dextromethorphan 15 MG/5ML syrup Take 10 mLs by mouth 4 (four) times daily as needed for cough.    . digoxin (LANOXIN) 0.125 MG tablet Take 0.5 tablets (0.0625 mg total) by mouth daily. 90 tablet 1  . diltiazem (CARDIZEM CD) 240 MG 24 hr capsule Take 1 capsule (240 mg total) by mouth daily. 30 capsule 0  . donepezil (ARICEPT) 5 MG tablet Take 1 tablet (5 mg total) by mouth at bedtime. 30 tablet 0  . hydrocortisone (ANUSOL-HC) 2.5 % rectal cream Place 1 application rectally 2 (two) times daily. PRN    . metoprolol succinate (TOPROL-XL) 100 MG 24 hr tablet Take 1 tablet (100 mg total) by mouth daily. Take with or immediately following a meal. 90 tablet 0   No current facility-administered medications for this visit.     Past Medical History:  Diagnosis Date  . Arthritis   . Bilateral chronic knee pain   . Brain aneurysm   . COPD (chronic obstructive pulmonary disease) (HCC)   . DDD (degenerative disc disease), lumbar   . Dementia (HCC)   . DJD (degenerative joint disease)   . DVT (deep venous thrombosis) (HCC)   . Gout   . Peripheral neuropathy   . Permanent atrial fibrillation [facility])    MDT [id number] pacemaker 03-26-2013 by Dr Ladona Ridgel  . Pulmonary embolism  (HCC)     ROS:   All systems reviewed and negative except as noted in the HPI.   Past Surgical History:  Procedure Laterality Date  . CARDIAC CATHETERIZATION  2001  . Craniotomy clipping  06-2002   unruptured  . Left paraclinoid segment anurysum     [facility]  . PACEMAKER INSERTION  09-13-1999; 03-26-2013   PPM gen change by Dr Ladona Ridgel 03-26-2013 - MDT [id number]  . PERMANENT PACEMAKER GENERATOR CHANGE N/A 03/26/2013   Procedure: PERMANENT PACEMAKER GENERATOR CHANGE;  Surgeon: Marinus Maw, MD;  Location: [facility] CATH LAB;  Service: Cardiovascular;  Laterality: N/A;     Family History  Problem Relation Age of Onset  . Heart attack Mother   . Heart attack Father   . Alzheimer's disease Sister      Social History   Socioeconomic History  . Marital status: Widowed    Spouse name: Not on file  . Number of children: Not on file  . Years of education: Not on file  . Highest education level: Not on file  Occupational History  . Occupation: retired from the telephone company  Tobacco Use  . Smoking status: Former Smoker    Packs/day: 0.25    Years: 35.00    Pack years:  8.75    Types: Cigarettes    Start date: 12/27/1977    Quit date: 05/26/2010    Years since quitting: 9.5  . Smokeless tobacco: Never Used  Vaping Use  . Vaping Use: Never assessed  Substance and Sexual Activity  . Alcohol use: No    Alcohol/week: 0.0 standard drinks  . Drug use: No  . Sexual activity: Not on file  Other Topics Concern  . Not on file  Social History Narrative  . Not on file   Social Determinants of Health   Financial Resource Strain:   . Difficulty of Paying Living Expenses:   Food Insecurity:   . Worried About Programme researcher, broadcasting/film/video in the Last Year:   . Barista in the Last Year:   Transportation Needs:   . Freight forwarder (Medical):   Marland Kitchen Lack of Transportation (Non-Medical):   Physical Activity:   . Days of Exercise per Week:   . Minutes of  Exercise per Session:   Stress:   . Feeling of Stress :   Social Connections:   . Frequency of Communication with Friends and Family:   . Frequency of Social Gatherings with Friends and Family:   . Attends Religious Services:   . Active Member of Clubs or Organizations:   . Attends Banker Meetings:   Marland Kitchen Marital Status:   Intimate Partner Violence:   . Fear of Current or Ex-Partner:   . Emotionally Abused:   Marland Kitchen Physically Abused:   . Sexually Abused:      BP 104/60 (BP Location: Left Arm, Patient Position: Sitting, Cuff Size: Normal)   Pulse 64   Ht 5\' 9"  (1.753 m)   Wt 174 lb 6.4 oz (79.1 kg)   SpO2 94% Comment: at rest  BMI 25.75 kg/m   Physical Exam:  Well appearing NAD HEENT: Unremarkable Neck:  No JVD, no thyromegally Lymphatics:  No adenopathy Back:  No CVA tenderness Lungs:  Clear with no wheezes HEART:  Regular rate rhythm, no murmurs, no rubs, no clicks Abd:  soft, positive bowel sounds, no organomegally, no rebound, no guarding Ext:  2 plus pulses, no edema, no cyanosis, no clubbing Skin:  No rashes no nodules Neuro:  CN II through XII intact, motor grossly intact  DEVICE  Normal device function.  See PaceArt for details.   Assess/Plan: 1. CHB - she is asymptomatic, s/p PPM insertion. She has an escape this morning. She is pacing 80% of the time. 2. PPM - her [employer] DDD PM is programmed VVI 60. She has 2 years on the battery.  3. Atrial fib - her VR is well controlled.  4. Coags - she has had no bleeding and no falls on Eliquis.   .D.

## 2019-12-08 NOTE — Telephone Encounter (Signed)
New message   [facility]I [facility] -  If she has any new medication please send it to this pharmacy from now on .

## 2019-12-08 NOTE — Telephone Encounter (Signed)
Noted  

## 2019-12-09 ENCOUNTER — Encounter (HOSPITAL_COMMUNITY): Payer: Self-pay

## 2019-12-09 ENCOUNTER — Emergency Department (HOSPITAL_COMMUNITY): Payer: Medicare Other

## 2019-12-09 ENCOUNTER — Emergency Department (HOSPITAL_COMMUNITY)
Admission: EM | Admit: 2019-12-09 | Discharge: 2019-12-09 | Disposition: A | Discharge status: Home / Self Care | Payer: Medicare Other | Attending: Emergency Medicine | Admitting: Emergency Medicine

## 2019-12-09 ENCOUNTER — Other Ambulatory Visit: Payer: Self-pay

## 2019-12-09 DIAGNOSIS — Z950 Presence of cardiac pacemaker: Secondary | ICD-10-CM | POA: Diagnosis not present

## 2019-12-09 DIAGNOSIS — R0602 Shortness of breath: Secondary | ICD-10-CM | POA: Insufficient documentation

## 2019-12-09 DIAGNOSIS — J449 Chronic obstructive pulmonary disease, unspecified: Secondary | ICD-10-CM | POA: Diagnosis not present

## 2019-12-09 DIAGNOSIS — R05 Cough: Secondary | ICD-10-CM | POA: Insufficient documentation

## 2019-12-09 DIAGNOSIS — R079 Chest pain, unspecified: Secondary | ICD-10-CM | POA: Diagnosis present

## 2019-12-09 DIAGNOSIS — Z87891 Personal history of nicotine dependence: Secondary | ICD-10-CM | POA: Diagnosis not present

## 2019-12-09 DIAGNOSIS — I10 Essential (primary) hypertension: Secondary | ICD-10-CM | POA: Diagnosis not present

## 2019-12-09 DIAGNOSIS — Z79899 Other long term (current) drug therapy: Secondary | ICD-10-CM | POA: Insufficient documentation

## 2019-12-09 DIAGNOSIS — F0390 Unspecified dementia without behavioral disturbance: Secondary | ICD-10-CM | POA: Insufficient documentation

## 2019-12-09 LAB — URINALYSIS, ROUTINE W REFLEX MICROSCOPIC
Bacteria, UA: NONE SEEN
Bilirubin Urine: NEGATIVE
Glucose, UA: NEGATIVE mg/dL
Ketones, ur: NEGATIVE mg/dL
Leukocytes,Ua: NEGATIVE
Nitrite: NEGATIVE
Protein, ur: NEGATIVE mg/dL
Specific Gravity, Urine: 1.011 (ref 1.005–1.030)
pH: 5 (ref 5.0–8.0)

## 2019-12-09 LAB — BASIC METABOLIC PANEL
Anion gap: 11 (ref 5–15)
BUN: 11 mg/dL (ref 8–23)
CO2: 26 mmol/L (ref 22–32)
Calcium: 9 mg/dL (ref 8.9–10.3)
Chloride: 104 mmol/L (ref 98–111)
Creatinine, Ser: 0.9 mg/dL (ref 0.44–1.00)
GFR calc Af Amer: >60 mL/min (ref >60)
GFR calc non Af Amer: 56 mL/min — ABNORMAL LOW (ref >60)
Glucose, Bld: 135 mg/dL — ABNORMAL HIGH (ref 70–99)
Potassium: 3.8 mmol/L (ref 3.5–5.1)
Sodium: 141 mmol/L (ref 135–145)

## 2019-12-09 LAB — DIGOXIN LEVEL: Digoxin Level: 0.4 ng/mL — ABNORMAL LOW (ref 0.8–2.0)

## 2019-12-09 LAB — CBC
HCT: 41 % (ref 36.0–46.0)
Hemoglobin: 13.1 g/dL (ref 12.0–15.0)
MCH: 33.7 pg (ref 26.0–34.0)
MCHC: 32 g/dL (ref 30.0–36.0)
MCV: 105.4 fL — ABNORMAL HIGH (ref 80.0–100.0)
Platelets: 142 10*3/uL — ABNORMAL LOW (ref 150–400)
RBC: 3.89 MIL/uL (ref 3.87–5.11)
RDW: 14.6 % (ref 11.5–15.5)
WBC: 10.1 10*3/uL (ref 4.0–10.5)
nRBC: 0 % (ref 0.0–0.2)

## 2019-12-09 LAB — TROPONIN I (HIGH SENSITIVITY)
Troponin I (High Sensitivity): 11 ng/L (ref <18)
Troponin I (High Sensitivity): 9 ng/L (ref <18)

## 2019-12-09 NOTE — ED Provider Notes (Signed)
[facility] EMERGENCY DEPARTMENT Provider Note   CSN: [id number] Arrival date & time: 12/09/19  [id number]     History Chief Complaint  Patient presents with  . Chest Pain    [name] is a 86 y.o. female.  Level 5 caveat secondary to dementia.  Patient is presenting from group home after telling staff she was having some chest discomfort.  EMS states patient had no complaints for them.  Patient is a very poor historian.  When I asked her if she is having chest pain she says sometimes.  She also says she sometimes has a cough.  No diaphoresis.  The history is provided by the patient.  Chest Pain Pain location:  Unable to specify Pain radiates to:  Does not radiate Pain severity:  Unable to specify Onset quality:  Unable to specify Timing:  Unable to specify Progression:  Resolved Chronicity:  Recurrent Relieved by:  None tried Worsened by:  Nothing Ineffective treatments:  None tried Associated symptoms: cough and shortness of breath   Associated symptoms: no abdominal pain, no diaphoresis, no fever and no vomiting   Risk factors: prior DVT/PE        Past Medical History:  Diagnosis Date  . Arthritis   . Bilateral chronic knee pain   . Brain aneurysm   . Brain aneurysm   . COPD (chronic obstructive pulmonary disease) (HCC)   . DDD (degenerative disc disease), lumbar   . Dementia (HCC)   . DJD (degenerative joint disease)   . DJD (degenerative joint disease)   . DVT (deep venous thrombosis) (HCC)   . Gout   . Peripheral neuropathy   . Permanent atrial fibrillation [facility](HCC)    MDT [id number] pacemaker 03-26-2013 by Dr Ladona Ridgelaylor  . Pulmonary embolism [facility](HCC)     Patient Active Problem List   Diagnosis Date Noted  . Hemorrhoids 10/06/2019  . At high risk for injury related to fall 08/27/2019  . Keratotic lesion 07/22/2019  . Hypokalemia 12/05/2016  . Dementia with behavioral disturbance (HCC) 04/25/2016  . Noncompliance with medication regimen 04/24/2016  . UTI  (urinary tract infection) 04/18/2016  . Chest pain 02/10/2014  . Edema 02/10/2014  . Encounter for therapeutic drug monitoring 08/13/2013  . Chronic anticoagulation 07/22/2010  . HYPOTENSION, ORTHOSTATIC 06/15/2010  . Essential hypertension, benign 04/06/2009  . Cardiac pacemaker in situ 04/06/2009  . PULMONARY EMBOLISM 01/18/2009  . ATRIAL FIBRILLATION, CHRONIC 01/18/2009  . BRADYCARDIA-TACHYCARDIA SYNDROME 01/18/2009  . DVT 01/18/2009    Past Surgical History:  Procedure Laterality Date  . CARDIAC CATHETERIZATION  2001  . Craniotomy clipping  06-2002   unruptured  . Left paraclinoid segment anurysum     [facility]  . PACEMAKER INSERTION  09-13-1999; 03-26-2013   PPM gen change by Dr Ladona Ridgelaylor 03-26-2013 - MDT [id number]  . PERMANENT PACEMAKER GENERATOR CHANGE N/A 03/26/2013   Procedure: PERMANENT PACEMAKER GENERATOR CHANGE;  Surgeon: Marinus MawGregg W Taylor, MD;  [facility];  Service: Cardiovascular;  Laterality: N/A;     OB History   No obstetric history on file.     Family History  Problem Relation Age of Onset  . Heart attack Mother   . Heart attack Father   . Alzheimer's disease Sister     Social History   Tobacco Use  . Smoking status: Former Smoker    Packs/day: 0.25    Years: 35.00    Pack years: 8.75    Types: Cigarettes    Start date: 12/27/1977  Quit date: 05/26/2010    Years since quitting: 9.5  . Smokeless tobacco: Never Used  Vaping Use  . Vaping Use: Never assessed  Substance Use Topics  . Alcohol use: No    Alcohol/week: 0.0 standard drinks  . Drug use: No    Home Medications Prior to Admission medications   Medication Sig Start Date End Date Taking? Authorizing Provider  allopurinol (ZYLOPRIM) 300 MG tablet Take 1 tablet (300 mg total) by mouth daily. 07/29/19   Wurst, [place], PA-C  apixaban (ELIQUIS) 5 MG TABS tablet Take 1 tablet (5 mg total) by mouth 2 (two) times daily. 09/28/19   Corum, Minerva Fester, MD    Chlorpheniramine-Acetaminophen (CORICIDIN HBP COLD/FLU PO) Take 1 tablet by mouth 2 (two) times daily. As needed    [provider]  dextromethorphan 15 MG/5ML syrup Take 10 mLs by mouth 4 (four) times daily as needed for cough.    [provider]  digoxin (LANOXIN) 0.125 MG tablet Take 0.5 tablets (0.0625 mg total) by mouth daily. 10/30/19   Marinus Maw, MD  diltiazem (CARDIZEM CD) 240 MG 24 hr capsule Take 1 capsule (240 mg total) by mouth daily. 07/29/19   Wurst, [place], PA-C  donepezil (ARICEPT) 5 MG tablet Take 1 tablet (5 mg total) by mouth at bedtime. 07/29/19   Wurst, [place], PA-C  hydrocortisone (ANUSOL-HC) 2.5 % rectal cream Place 1 application rectally 2 (two) times daily. PRN    [provider]  metoprolol succinate (TOPROL-XL) 100 MG 24 hr tablet Take 1 tablet (100 mg total) by mouth daily. Take with or immediately following a meal. 09/28/19   Corum, Minerva Fester, MD    Allergies    Bee venom and Codeine  Review of Systems   Review of Systems  Unable to perform ROS: Dementia  Constitutional: Negative for diaphoresis and fever.  Respiratory: Positive for cough and shortness of breath.   Cardiovascular: Positive for chest pain.  Gastrointestinal: Negative for abdominal pain and vomiting.    Physical Exam Updated Vital Signs BP 125/66 (BP Location: Left Arm)   Pulse 60   Temp 98 F (36.7 C) (Oral)   Resp 20   Ht 5\' 9"  (1.753 m)   Wt 79 kg   SpO2 98%   BMI 25.72 kg/m   Physical Exam Vitals and nursing note reviewed.  Constitutional:      General: She is not in acute distress.    Appearance: She is well-developed.  HENT:     Head: Normocephalic and atraumatic.  Eyes:     Conjunctiva/sclera: Conjunctivae normal.  Cardiovascular:     Rate and Rhythm: Normal rate and regular rhythm.     Heart sounds: No murmur heard.   Pulmonary:     Effort: Pulmonary effort is normal. No respiratory distress.     Breath sounds: Normal breath sounds.   Abdominal:     Palpations: Abdomen is soft.     Tenderness: There is no abdominal tenderness.  Musculoskeletal:     Cervical back: Neck supple.     Right lower leg: No tenderness. No edema.     Left lower leg: No tenderness. No edema.  Skin:    General: Skin is warm and dry.  Neurological:     General: No focal deficit present.     Mental Status: She is alert. She is disoriented.     Comments: She is oriented to person and place.  Does not know the date.  She can carry on a conversation  but mostly likes to tell stories and not answer the question asked.     ED Results / Procedures / Treatments   Labs (all labs ordered are listed, but only abnormal results are displayed) Labs Reviewed  BASIC METABOLIC PANEL - Abnormal; Notable for the following components:      Result Value   Glucose, Bld 135 (*)    GFR calc non Af Amer 56 (*)    All other components within normal limits  CBC - Abnormal; Notable for the following components:   MCV 105.4 (*)    Platelets 142 (*)    All other components within normal limits  URINALYSIS, ROUTINE W REFLEX MICROSCOPIC - Abnormal; Notable for the following components:   Hgb urine dipstick SMALL (*)    All other components within normal limits  DIGOXIN LEVEL - Abnormal; Notable for the following components:   Digoxin Level 0.4 (*)    All other components within normal limits  TROPONIN I (HIGH SENSITIVITY)  TROPONIN I (HIGH SENSITIVITY)    EKG EKG Interpretation  Date/Time:  Wednesday December 09 2019 10:10:14 EDT Ventricular Rate:  60 PR Interval:    QRS Duration: 165 QT Interval:  481 QTC Calculation: 481 R Axis:   -73 Text Interpretation: VENTRICULAR PACED RHYTHM underlying atrial fib Confirmed by Meridee Score [id number]) on 12/09/2019 10:16:57 AM   Radiology DG Chest Port 1 View  Result Date: 12/09/2019 CLINICAL DATA:  Chest pressure this morning, history smoking, COPD, pulmonary embolism, pacemaker, dementia, atrial fibrillation EXAM:  PORTABLE CHEST 1 VIEW COMPARISON:  Portable exam 1016 hours compared to 05/27/2017 FINDINGS: RIGHT subclavian transvenous pacemaker with leads projecting over RIGHT atrium and RIGHT ventricle. Enlargement of cardiac silhouette. Mediastinal contours and pulmonary vascularity normal. Atherosclerotic calcification aorta. Emphysematous and bronchitic changes consistent with COPD. Linear scarring at lingula and RIGHT base unchanged. No acute infiltrate, pleural effusion, or pneumothorax. Osseous demineralization. IMPRESSION: Enlargement of cardiac silhouette post CABG. COPD changes with bibasilar scarring. No acute abnormalities. Aortic Atherosclerosis (ICD10-I70.0). Electronically Signed   By: Ulyses Southward M.D.   On: 12/09/2019 10:21    Procedures Procedures (including critical care time)  Medications Ordered in ED Medications - No data to display  ED Course  I have reviewed the triage vital signs and the nursing notes.  Pertinent labs & imaging results that were available during my care of the patient were reviewed by me and considered in my medical decision making (see chart for details).  Clinical Course as of Dec 10 942  Wed Dec 09, 2019  1014 Reviewed patient's record in epic.  She actually had a visit with cardiology yesterday and they thought she was doing well.   [MB]  1015 Blood pressure low here but appears baseline.   [MB]  1042 Chest x-ray interpreted by me as showing cardiomegaly no acute infiltrates.   [MB]  1253 Reevaluated patient she said she is feeling well and ready for discharge.   [MB]    Clinical Course User Index [MB] Terrilee Files, MD   MDM Rules/Calculators/A&P                         This patient complains of chest pain; this involves an extensive number of treatment Options and is a complaint that carries with it a high risk of complications and Morbidity. The differential includes ACS, reflux, pneumonia, pneumothorax, vascular, musculoskeletal  I ordered,  reviewed and interpreted labs, which included CBC with normal white count normal hemoglobin,  slightly low platelets, chemistries with slightly elevated glucose, delta troponin unchanged, digoxin subtherapeutic, urinalysis without signs of infection I ordered imaging studies which included chest x-ray and I independently    visualized and interpreted imaging which showed no acute infiltrates Additional history obtained from EMS Previous records obtained and reviewed in epic, no recent cardiac visits  After the interventions stated above, I reevaluated the patient and found patient with stable vitals and asymptomatic.  She denies any complaints.  She said she would like to return to her facility.  Do not see any evidence of active ischemia.  Final Clinical Impression(s) / ED Diagnoses Final diagnoses:  Nonspecific chest pain    Rx / DC Orders ED Discharge Orders    None       Terrilee Files, MD 12/10/19 [id number]

## 2019-12-09 NOTE — Discharge Instructions (Addendum)
You are seen in the emergency department for evaluation of chest pain.  You had blood work chest x-ray and an EKG here that did not show any serious findings.  Please follow-up with your regular doctor.  Return to the emergency department if any worsening or concerning symptoms.

## 2019-12-09 NOTE — ED Triage Notes (Signed)
EMS reports pt from a group home and staff reported that pt has been c/o chest pressure this morning.  EMS says pt hasn't had any cp with them.  EMS reports pt was ambulatory and without complaints.

## 2019-12-27 ENCOUNTER — Emergency Department (HOSPITAL_COMMUNITY)
Admission: EM | Admit: 2019-12-27 | Discharge: 2019-12-28 | Disposition: A | Discharge status: Skilled Nursing Facility | Payer: Medicare Other | Attending: Emergency Medicine | Admitting: Emergency Medicine

## 2019-12-27 ENCOUNTER — Other Ambulatory Visit: Payer: Self-pay

## 2019-12-27 ENCOUNTER — Encounter (HOSPITAL_COMMUNITY): Payer: Self-pay | Admitting: Emergency Medicine

## 2019-12-27 DIAGNOSIS — J449 Chronic obstructive pulmonary disease, unspecified: Secondary | ICD-10-CM | POA: Diagnosis not present

## 2019-12-27 DIAGNOSIS — Z79899 Other long term (current) drug therapy: Secondary | ICD-10-CM | POA: Diagnosis not present

## 2019-12-27 DIAGNOSIS — Z87891 Personal history of nicotine dependence: Secondary | ICD-10-CM | POA: Diagnosis not present

## 2019-12-27 DIAGNOSIS — Z7901 Long term (current) use of anticoagulants: Secondary | ICD-10-CM | POA: Diagnosis not present

## 2019-12-27 DIAGNOSIS — K649 Unspecified hemorrhoids: Secondary | ICD-10-CM | POA: Diagnosis present

## 2019-12-27 DIAGNOSIS — I10 Essential (primary) hypertension: Secondary | ICD-10-CM | POA: Diagnosis not present

## 2019-12-27 DIAGNOSIS — F0390 Unspecified dementia without behavioral disturbance: Secondary | ICD-10-CM | POA: Diagnosis not present

## 2019-12-27 LAB — URINALYSIS, ROUTINE W REFLEX MICROSCOPIC
Bacteria, UA: NONE SEEN
Bilirubin Urine: NEGATIVE
Glucose, UA: NEGATIVE mg/dL
Ketones, ur: NEGATIVE mg/dL
Leukocytes,Ua: NEGATIVE
Nitrite: NEGATIVE
Protein, ur: NEGATIVE mg/dL
Specific Gravity, Urine: 1.008 (ref 1.005–1.030)
pH: 6 (ref 5.0–8.0)

## 2019-12-27 LAB — CBC WITH DIFFERENTIAL/PLATELET
Abs Immature Granulocytes: 0.08 10*3/uL — ABNORMAL HIGH (ref 0.00–0.07)
Basophils Absolute: 0 10*3/uL (ref 0.0–0.1)
Basophils Relative: 1 %
Eosinophils Absolute: 0.1 10*3/uL (ref 0.0–0.5)
Eosinophils Relative: 1 %
HCT: 40.8 % (ref 36.0–46.0)
Hemoglobin: 13.4 g/dL (ref 12.0–15.0)
Immature Granulocytes: 1 %
Lymphocytes Relative: 19 %
Lymphs Abs: 1.3 10*3/uL (ref 0.7–4.0)
MCH: 33.9 pg (ref 26.0–34.0)
MCHC: 32.8 g/dL (ref 30.0–36.0)
MCV: 103.3 fL — ABNORMAL HIGH (ref 80.0–100.0)
Monocytes Absolute: 0.6 10*3/uL (ref 0.1–1.0)
Monocytes Relative: 8 %
Neutro Abs: 4.9 10*3/uL (ref 1.7–7.7)
Neutrophils Relative %: 70 %
Platelets: 173 10*3/uL (ref 150–400)
RBC: 3.95 MIL/uL (ref 3.87–5.11)
RDW: 14.4 % (ref 11.5–15.5)
WBC: 6.9 10*3/uL (ref 4.0–10.5)
nRBC: 0 % (ref 0.0–0.2)

## 2019-12-27 LAB — COMPREHENSIVE METABOLIC PANEL
ALT: 19 U/L (ref 0–44)
AST: 20 U/L (ref 15–41)
Albumin: 3.7 g/dL (ref 3.5–5.0)
Alkaline Phosphatase: 62 U/L (ref 38–126)
Anion gap: 10 (ref 5–15)
BUN: 12 mg/dL (ref 8–23)
CO2: 27 mmol/L (ref 22–32)
Calcium: 9.1 mg/dL (ref 8.9–10.3)
Chloride: 103 mmol/L (ref 98–111)
Creatinine, Ser: 0.84 mg/dL (ref 0.44–1.00)
GFR calc Af Amer: >60 mL/min (ref >60)
GFR calc non Af Amer: >60 mL/min (ref >60)
Glucose, Bld: 102 mg/dL — ABNORMAL HIGH (ref 70–99)
Potassium: 3.8 mmol/L (ref 3.5–5.1)
Sodium: 140 mmol/L (ref 135–145)
Total Bilirubin: 1.3 mg/dL — ABNORMAL HIGH (ref 0.3–1.2)
Total Protein: 7.1 g/dL (ref 6.5–8.1)

## 2019-12-27 MED ORDER — HYDROCORTISONE (PERIANAL) 2.5 % EX CREA
1.0000 | TOPICAL_CREAM | Freq: Two times a day (BID) | CUTANEOUS | 0 refills | Status: DC
Start: 2019-12-27 — End: 2022-11-29

## 2019-12-27 NOTE — ED Notes (Addendum)
Sent from [employer] [facility] - which is new per caregiver   For ? UTI   And hemorrhoid pain  Urine dark since yesterday

## 2019-12-27 NOTE — ED Notes (Signed)
Report to [name]  EMS called for pt transport hmoe

## 2019-12-27 NOTE — ED Provider Notes (Signed)
[facility] EMERGENCY DEPARTMENT Provider Note   CSN: [id number] Arrival date & time: 12/27/19  1909     History Chief Complaint  Patient presents with  . UTI symptoms    [name] is a 86 y.o. female with PMH of dementia, UTI, DVT/PE, and atrial fibrillation on Eliquis who presents to the ED via EMS from assisted living facility for dysuria and hemorrhoids.  Patient has been evaluated for painful hemorrhoids for months by her primary care provider.  She has been referred to GI for possible banding given her discomfort despite sitz bath's and topical medications.    I spoke with Shearon Balo at [facility], [phone], and she informs her that patient has not been feeling well the past 2 days.  She has not been eating or drinking well and her urine is strong.  She has also been having hallucinations which is unusual for her.  They are concerned for UTI.  They also report that she has been having multiple stools per day which has been irritating her known hemorrhoids.  They use Tucks pads, but no other medications or suppositories.  She reports that there have been no fevers or other complaints.    When I examine patient here in the ED, she was particularly pleasant.  She states that she is familiar with Ms. Simpson as she works at the hospital where she has been staying as an inpatient for quite some time.  She goes on to tell me about how she used to be very active when she lives with her husband.  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  My brief evaluation of her is consistent with her diagnosis of dementia, however she is answering questions appropriately and does not appear to be in any acute distress.  She denies any specific complaints.  When I asked her about chest pain, she points towards her pacemaker and states that it can be bothersome at times, but no other chest pain symptoms.  When I asked her about her  hemorrhoids, she states "well I am 85, I expected to have them).  She is 86 years old.  Level 5 caveat due to dementia.  HPI     Past Medical History:  Diagnosis Date  . Arthritis   . Bilateral chronic knee pain   . Brain aneurysm   . Brain aneurysm   . COPD (chronic obstructive pulmonary disease) (HCC)   . DDD (degenerative disc disease), lumbar   . Dementia (HCC)   . DJD (degenerative joint disease)   . DJD (degenerative joint disease)   . DVT (deep venous thrombosis) (HCC)   . Gout   . Peripheral neuropathy   . Permanent atrial fibrillation [facility])    MDT [id number] pacemaker 03-26-2013 by Dr Ladona Ridgel  . Pulmonary embolism [facility])     Patient Active Problem List   Diagnosis Date Noted  . Hemorrhoids 10/06/2019  . At high risk for injury related to fall 08/27/2019  . Keratotic lesion 07/22/2019  . Hypokalemia 12/05/2016  . Dementia with behavioral disturbance (HCC) 04/25/2016  . Noncompliance with medication regimen 04/24/2016  . UTI (urinary tract infection) 04/18/2016  . Chest pain 02/10/2014  . Edema 02/10/2014  . Encounter for therapeutic drug monitoring 08/13/2013  . Chronic anticoagulation 07/22/2010  . HYPOTENSION, ORTHOSTATIC 06/15/2010  . Essential hypertension, benign 04/06/2009  . Cardiac pacemaker in situ 04/06/2009  . PULMONARY EMBOLISM 01/18/2009  . ATRIAL FIBRILLATION,  CHRONIC 01/18/2009  . BRADYCARDIA-TACHYCARDIA SYNDROME 01/18/2009  . DVT 01/18/2009    Past Surgical History:  Procedure Laterality Date  . CARDIAC CATHETERIZATION  2001  . Craniotomy clipping  06-2002   unruptured  . Left paraclinoid segment anurysum     [facility]  . PACEMAKER INSERTION  09-13-1999; 03-26-2013   PPM gen change by Dr Ladona Ridgel 03-26-2013 - MDT [id number]  . PERMANENT PACEMAKER GENERATOR CHANGE N/A 03/26/2013   Procedure: PERMANENT PACEMAKER GENERATOR CHANGE;  Surgeon: Marinus Maw, MD;  Location: [facility] CATH LAB;  Service: Cardiovascular;  Laterality: N/A;       OB History   No obstetric history on file.     Family History  Problem Relation Age of Onset  . Heart attack Mother   . Heart attack Father   . Alzheimer's disease Sister     Social History   Tobacco Use  . Smoking status: Former Smoker    Packs/day: 0.25    Years: 35.00    Pack years: 8.75    Types: Cigarettes    Start date: 12/27/1977    Quit date: 05/26/2010    Years since quitting: 9.5  . Smokeless tobacco: Never Used  Vaping Use  . Vaping Use: Never assessed  Substance Use Topics  . Alcohol use: No    Alcohol/week: 0.0 standard drinks  . Drug use: No    Home Medications Prior to Admission medications   Medication Sig Start Date End Date Taking? Authorizing Provider  allopurinol (ZYLOPRIM) 300 MG tablet Take 1 tablet (300 mg total) by mouth daily. 07/29/19   Wurst, [place], PA-C  apixaban (ELIQUIS) 5 MG TABS tablet Take 1 tablet (5 mg total) by mouth 2 (two) times daily. 09/28/19   Corum, Minerva Fester, MD  Chlorpheniramine-Acetaminophen (CORICIDIN HBP COLD/FLU PO) Take 1 tablet by mouth 2 (two) times daily. As needed    [provider]  dextromethorphan 15 MG/5ML syrup Take 10 mLs by mouth 4 (four) times daily as needed for cough.    [provider]  digoxin (LANOXIN) 0.125 MG tablet Take 0.5 tablets (0.0625 mg total) by mouth daily. 10/30/19   Marinus Maw, MD  diltiazem (CARDIZEM CD) 240 MG 24 hr capsule Take 1 capsule (240 mg total) by mouth daily. 07/29/19   Wurst, [place], PA-C  donepezil (ARICEPT) 5 MG tablet Take 1 tablet (5 mg total) by mouth at bedtime. 07/29/19   Wurst, [place], PA-C  hydrocortisone (ANUSOL-HC) 2.5 % rectal cream Place 1 application rectally 2 (two) times daily. 12/27/19   Lorelee New, PA-C  metoprolol succinate (TOPROL-XL) 100 MG 24 hr tablet Take 1 tablet (100 mg total) by mouth daily. Take with or immediately following a meal. 09/28/19   Corum, Minerva Fester, MD    Allergies    Bee venom and Codeine  Review of Systems    Review of Systems  Unable to perform ROS: Dementia    Physical Exam Updated Vital Signs BP 138/70 (BP Location: Right Arm)   Pulse 68   Temp 97.8 F (36.6 C) (Oral)   Resp 18   Ht 5\' 6"  (1.676 m)   Wt 72.6 kg   SpO2 98%   BMI 25.82 kg/m   Physical Exam Vitals and nursing note reviewed. Exam conducted with a chaperone present.  Constitutional:      General: She is not in acute distress.    Appearance: She is not ill-appearing.     Comments: Pleasantly demented.  HENT:  Head: Normocephalic and atraumatic.  Eyes:     General: No scleral icterus.    Conjunctiva/sclera: Conjunctivae normal.  Cardiovascular:     Rate and Rhythm: Normal rate and regular rhythm.     Pulses: Normal pulses.     Heart sounds: Normal heart sounds.  Pulmonary:     Effort: Pulmonary effort is normal. No respiratory distress.     Breath sounds: Normal breath sounds.  Abdominal:     General: Abdomen is flat. There is no distension.     Palpations: Abdomen is soft. There is no mass.     Tenderness: There is no abdominal tenderness. There is no guarding.     Comments: No CVAT bilaterally.  Genitourinary:    Comments: Patient has multiple hemorrhoids noted on exam.  They do not appear to be thrombosed or bleeding.   Musculoskeletal:     Right lower leg: No edema.     Left lower leg: No edema.     Comments: Moves all extremities.  Skin:    General: Skin is dry.     Capillary Refill: Capillary refill takes less than 2 seconds.  Neurological:     Mental Status: She is alert.     GCS: GCS eye subscore is 4. GCS verbal subscore is 5. GCS motor subscore is 6.     Comments: Oriented only to herself.  Psychiatric:        Mood and Affect: Mood normal.        Behavior: Behavior normal.        Thought Content: Thought content normal.     ED Results / Procedures / Treatments   Labs (all labs ordered are listed, but only abnormal results are displayed) Labs Reviewed  URINALYSIS, ROUTINE W REFLEX  MICROSCOPIC - Abnormal; Notable for the following components:      Result Value   Hgb urine dipstick SMALL (*)    All other components within normal limits  CBC WITH DIFFERENTIAL/PLATELET - Abnormal; Notable for the following components:   MCV 103.3 (*)    Abs Immature Granulocytes 0.08 (*)    All other components within normal limits  COMPREHENSIVE METABOLIC PANEL - Abnormal; Notable for the following components:   Glucose, Bld 102 (*)    Total Bilirubin 1.3 (*)    All other components within normal limits  URINE CULTURE    EKG None  Radiology No results found.  Procedures Procedures (including critical care time)  Medications Ordered in ED Medications - No data to display  ED Course  I have reviewed the triage vital signs and the nursing notes.  Pertinent labs & imaging results that were available during my care of the patient were reviewed by me and considered in my medical decision making (see chart for details).    MDM Rules/Calculators/A&P                          On my examination, patient is pleasantly demented.  She believes that she is 86 years old and is currently being hospitalized, rather than living at an assisted living facility.  She is level 5 caveat due to dementia.  However, she is well-appearing and is denying any specific complaints.  I obtained history from her assessment facility reports that she has been altered beyond her baseline dementia including AVH.  They also have reported foul-smelling urine and are concerned for UTI.  No fevers or falls reported.    I performed a anorectal  exam with Laverda SorensonAnn Tuttle RN at bedside.  There were hemorrhoids noted on exam, but did not appear to be thrombosed.  Nonbleeding.  Likely mildly swollen/irritated from recent bowel movements.  They are using Tucks pads.  Will emphasize importance of high-fiber diet, increased water intake, sitz bath's, and will prescribe Anusol suppository x10 days.  Patient had been referred to  gastroenterology by her primary care provider for banding for similar complaints in the past.  Labs CBC with differential: Unremarkable.  No leukocytosis concerning for infection.  No anemia. CMP: Unremarkable. UA: Completely unremarkable.  No evidence to suggest infection. Urine culture: Pending.  Patient's vital signs are stable and WNL.  Patient is not in any acute distress.  Her laboratory work-up and physical exam is reassuring.  Patient can be discharged back to her assisted living facility.  Encouraged patient (and instructed in AVS) to follow-up with her primary care provider for ongoing evaluation and management.  Urine culture is pending.  Discussed case with Dr. Estell HarpinZammit who agrees with plan.   Final Clinical Impression(s) / ED Diagnoses Final diagnoses:  Hemorrhoids, unspecified hemorrhoid type    Rx / DC Orders ED Discharge Orders         Ordered    hydrocortisone (ANUSOL-HC) 2.5 % rectal cream  2 times daily        12/27/19 2146           Lorelee NewGreen, Krissy Orebaugh L, PA-C 12/27/19 2149    Bethann BerkshireZammit, Joseph, MD 12/28/19 1008

## 2019-12-27 NOTE — ED Notes (Signed)
Pt to BR for urine spec   Nuns hat to toilet   Pt unable to void   Returned to her stretcher

## 2019-12-27 NOTE — ED Notes (Signed)
EMS called for transport home.

## 2019-12-27 NOTE — Discharge Instructions (Addendum)
While patient was noted to have foul-smelling urine by staff, her urine obtained here was entirely unremarkable.  No evidence to suggest infection, despite her reported confusion beyond baseline.  Her laboratory work-up is all reassuring.  She has no complaints on my exam and would like to return home.  Vital signs stable and WNL.   Patient does have hemorrhoids, but they do not appear to be thrombosed or bleeding on my exam.  She has been referred to gastroenterology by her primary care provider (Dr. Judee Clara) for banding procedure given her symptomatic hemorrhoids.  I understand that she is already receiving Tucks pads.  I will also recommend that she continue with high-fiber diet, regular sitz bath's, stool softeners as needed, and Anusol suppository x10 days.    Please have her follow-up with her primary care provider regarding today's encounter.  Return to the ED or seek immediate medical attention should she experience any new or worsening symptoms.

## 2019-12-27 NOTE — ED Triage Notes (Signed)
Patient brought in by EMS from assisted living facility for urinary symptoms. Patient's urine is strong and patient is having burning urination. Secondary issue is painful  Hemorrhoids.

## 2019-12-29 ENCOUNTER — Telehealth: Payer: Self-pay | Admitting: Internal Medicine

## 2019-12-29 ENCOUNTER — Other Ambulatory Visit: Payer: Self-pay | Admitting: Family Medicine

## 2019-12-29 DIAGNOSIS — I10 Essential (primary) hypertension: Secondary | ICD-10-CM

## 2019-12-29 DIAGNOSIS — I4891 Unspecified atrial fibrillation: Secondary | ICD-10-CM

## 2019-12-29 LAB — URINE CULTURE: Culture: NO GROWTH

## 2019-12-29 NOTE — Telephone Encounter (Signed)
Per Lupita Leash, Who should [facility] call for refills of eliquis   Please call Lupita Leash [phone]    Thanks renee

## 2019-12-31 MED ORDER — APIXABAN 5 MG PO TABS: 5.0000 mg | ORAL_TABLET | Freq: Two times a day (BID) | ORAL | 3 refills | Status: DC

## 2019-12-31 NOTE — Telephone Encounter (Signed)
Notified Lupita Leash that our office will refill the Eliquis

## 2020-01-05 ENCOUNTER — Emergency Department (HOSPITAL_COMMUNITY): Payer: Medicare Other

## 2020-01-05 ENCOUNTER — Emergency Department (HOSPITAL_COMMUNITY)
Admission: EM | Admit: 2020-01-05 | Discharge: 2020-01-05 | Disposition: A | Discharge status: Home / Self Care | Payer: Medicare Other | Attending: Emergency Medicine | Admitting: Emergency Medicine

## 2020-01-05 ENCOUNTER — Encounter (HOSPITAL_COMMUNITY): Payer: Self-pay

## 2020-01-05 ENCOUNTER — Other Ambulatory Visit: Payer: Self-pay

## 2020-01-05 DIAGNOSIS — K5732 Diverticulitis of large intestine without perforation or abscess without bleeding: Secondary | ICD-10-CM | POA: Diagnosis not present

## 2020-01-05 DIAGNOSIS — Z7901 Long term (current) use of anticoagulants: Secondary | ICD-10-CM | POA: Diagnosis not present

## 2020-01-05 DIAGNOSIS — F0390 Unspecified dementia without behavioral disturbance: Secondary | ICD-10-CM | POA: Insufficient documentation

## 2020-01-05 DIAGNOSIS — I4891 Unspecified atrial fibrillation: Secondary | ICD-10-CM | POA: Insufficient documentation

## 2020-01-05 DIAGNOSIS — J449 Chronic obstructive pulmonary disease, unspecified: Secondary | ICD-10-CM | POA: Insufficient documentation

## 2020-01-05 DIAGNOSIS — Z87891 Personal history of nicotine dependence: Secondary | ICD-10-CM | POA: Diagnosis not present

## 2020-01-05 DIAGNOSIS — Z79899 Other long term (current) drug therapy: Secondary | ICD-10-CM | POA: Insufficient documentation

## 2020-01-05 DIAGNOSIS — R109 Unspecified abdominal pain: Secondary | ICD-10-CM | POA: Diagnosis present

## 2020-01-05 DIAGNOSIS — Z86711 Personal history of pulmonary embolism: Secondary | ICD-10-CM | POA: Diagnosis not present

## 2020-01-05 DIAGNOSIS — Z86718 Personal history of other venous thrombosis and embolism: Secondary | ICD-10-CM | POA: Diagnosis not present

## 2020-01-05 DIAGNOSIS — Z950 Presence of cardiac pacemaker: Secondary | ICD-10-CM | POA: Insufficient documentation

## 2020-01-05 DIAGNOSIS — K5792 Diverticulitis of intestine, part unspecified, without perforation or abscess without bleeding: Secondary | ICD-10-CM

## 2020-01-05 LAB — CBC
HCT: 41.3 % (ref 36.0–46.0)
Hemoglobin: 12.9 g/dL (ref 12.0–15.0)
MCH: 32.6 pg (ref 26.0–34.0)
MCHC: 31.2 g/dL (ref 30.0–36.0)
MCV: 104.3 fL — ABNORMAL HIGH (ref 80.0–100.0)
Platelets: 162 10*3/uL (ref 150–400)
RBC: 3.96 MIL/uL (ref 3.87–5.11)
RDW: 14.4 % (ref 11.5–15.5)
WBC: 9.4 10*3/uL (ref 4.0–10.5)
nRBC: 0 % (ref 0.0–0.2)

## 2020-01-05 LAB — COMPREHENSIVE METABOLIC PANEL
ALT: 15 U/L (ref 0–44)
AST: 18 U/L (ref 15–41)
Albumin: 3.2 g/dL — ABNORMAL LOW (ref 3.5–5.0)
Alkaline Phosphatase: 57 U/L (ref 38–126)
Anion gap: 9 (ref 5–15)
BUN: 13 mg/dL (ref 8–23)
CO2: 25 mmol/L (ref 22–32)
Calcium: 8.8 mg/dL — ABNORMAL LOW (ref 8.9–10.3)
Chloride: 104 mmol/L (ref 98–111)
Creatinine, Ser: 0.89 mg/dL (ref 0.44–1.00)
GFR calc Af Amer: >60 mL/min (ref >60)
GFR calc non Af Amer: 56 mL/min — ABNORMAL LOW (ref >60)
Glucose, Bld: 113 mg/dL — ABNORMAL HIGH (ref 70–99)
Potassium: 3.4 mmol/L — ABNORMAL LOW (ref 3.5–5.1)
Sodium: 138 mmol/L (ref 135–145)
Total Bilirubin: 0.8 mg/dL (ref 0.3–1.2)
Total Protein: 6.6 g/dL (ref 6.5–8.1)

## 2020-01-05 LAB — URINALYSIS, ROUTINE W REFLEX MICROSCOPIC
Bacteria, UA: NONE SEEN
Bilirubin Urine: NEGATIVE
Glucose, UA: NEGATIVE mg/dL
Ketones, ur: NEGATIVE mg/dL
Nitrite: POSITIVE — AB
Protein, ur: NEGATIVE mg/dL
Specific Gravity, Urine: 1.013 (ref 1.005–1.030)
WBC, UA: >50 WBC/hpf — ABNORMAL HIGH (ref 0–5)
pH: 6 (ref 5.0–8.0)

## 2020-01-05 LAB — LIPASE, BLOOD: Lipase: 18 U/L (ref 11–51)

## 2020-01-05 MED ORDER — METRONIDAZOLE 500 MG PO TABS
500.0000 mg | ORAL_TABLET | Freq: Three times a day (TID) | ORAL | 0 refills | Status: DC
Start: 2020-01-05 — End: 2021-10-12

## 2020-01-05 MED ORDER — SODIUM CHLORIDE 0.9% FLUSH
3.0000 mL | Freq: Once | INTRAVENOUS | Status: AC
  Administered 2020-01-05: 3 mL via INTRAVENOUS

## 2020-01-05 MED ORDER — CIPROFLOXACIN HCL 250 MG PO TABS
500.0000 mg | ORAL_TABLET | Freq: Once | ORAL | Status: AC
  Administered 2020-01-05: 500 mg via ORAL
  Filled 2020-01-05: qty 2

## 2020-01-05 MED ORDER — METRONIDAZOLE 500 MG PO TABS
500.0000 mg | ORAL_TABLET | Freq: Once | ORAL | Status: AC
  Administered 2020-01-05: 500 mg via ORAL
  Filled 2020-01-05: qty 1

## 2020-01-05 MED ORDER — CIPROFLOXACIN HCL 500 MG PO TABS
500.0000 mg | ORAL_TABLET | Freq: Two times a day (BID) | ORAL | 0 refills | Status: DC
Start: 2020-01-05 — End: 2020-08-17

## 2020-01-05 MED ORDER — SODIUM CHLORIDE 0.9 % IV BOLUS
500.0000 mL | Freq: Once | INTRAVENOUS | Status: AC
  Administered 2020-01-05: 500 mL via INTRAVENOUS

## 2020-01-05 NOTE — ED Notes (Signed)
Spoke with staff at Adult care, stated pt was on the toilet and had a big pain on her left side and started sweating and feeling dizzy . Nurse asked last bm and she said yesterday.Marland Kitchen

## 2020-01-05 NOTE — Discharge Instructions (Addendum)
Begin taking Cipro and Flagyl as prescribed.  Return to the emergency department for worsening pain, high fever, bloody stools, or other new and concerning symptoms.

## 2020-01-05 NOTE — ED Triage Notes (Signed)
EMS reports pt c/o left sided abd pain and back pain since this morning.  Reports vomited once and had 1 episode of diarrhea.  Pt from [facility] adult care.  Pt presently alert and oriented.

## 2020-01-05 NOTE — ED Provider Notes (Signed)
[name] EMERGENCY DEPARTMENT Provider Note   CSN: [id number] Arrival date & time: 01/05/20  1043     History Chief Complaint  Patient presents with  . Abdominal Pain    [name] is a 86 y.o. female.  Patient is a 86 year old female with past medical history of COPD, dementia, persistent A. fib, prior pulmonary embolism on anticoagulation.  She presents today for evaluation of abdominal pain.  Patient came from her extended care facility where she was reported to have had 1 episode of vomiting and one episode of diarrhea.  Patient describes lower abdominal discomfort and she was sent here for evaluation.  Patient tells me she feels fine now.  She has not certain as to why she is here.  The history is provided by the patient.  Abdominal Pain Pain location:  Suprapubic Pain quality: cramping   Pain radiates to:  Does not radiate Pain severity:  Moderate Onset quality:  Sudden Progression:  Improving Chronicity:  New      Past Medical History:  Diagnosis Date  . Arthritis   . Bilateral chronic knee pain   . Brain aneurysm   . Brain aneurysm   . Brain aneurysm   . COPD (chronic obstructive pulmonary disease) (HCC)   . DDD (degenerative disc disease), lumbar   . Dementia (HCC)   . DJD (degenerative joint disease)   . DJD (degenerative joint disease)   . DVT (deep venous thrombosis) (HCC)   . Gout   . Peripheral neuropathy   . Permanent atrial fibrillation [facility])    MDT [id number] pacemaker 03-26-2013 by Dr Ladona Ridgel  . Pulmonary embolism [facility])     Patient Active Problem List   Diagnosis Date Noted  . Hemorrhoids 10/06/2019  . At high risk for injury related to fall 08/27/2019  . Keratotic lesion 07/22/2019  . Hypokalemia 12/05/2016  . Dementia with behavioral disturbance (HCC) 04/25/2016  . Noncompliance with medication regimen 04/24/2016  . UTI (urinary tract infection) 04/18/2016  . Chest pain 02/10/2014  . Edema 02/10/2014  . Encounter for therapeutic  drug monitoring 08/13/2013  . Chronic anticoagulation 07/22/2010  . HYPOTENSION, ORTHOSTATIC 06/15/2010  . Essential hypertension, benign 04/06/2009  . Cardiac pacemaker in situ 04/06/2009  . PULMONARY EMBOLISM 01/18/2009  . ATRIAL FIBRILLATION, CHRONIC 01/18/2009  . BRADYCARDIA-TACHYCARDIA SYNDROME 01/18/2009  . DVT 01/18/2009    Past Surgical History:  Procedure Laterality Date  . CARDIAC CATHETERIZATION  2001  . Craniotomy clipping  06-2002   unruptured  . Left paraclinoid segment anurysum     [facility]  . PACEMAKER INSERTION  09-13-1999; 03-26-2013   PPM gen change by Dr Ladona Ridgel 03-26-2013 - MDT [id number]  . PERMANENT PACEMAKER GENERATOR CHANGE N/A 03/26/2013   Procedure: PERMANENT PACEMAKER GENERATOR CHANGE;  Surgeon: Marinus Maw, MD;  Location: [facility] CATH LAB;  Service: Cardiovascular;  Laterality: N/A;     OB History   No obstetric history on file.     Family History  Problem Relation Age of Onset  . Heart attack Mother   . Heart attack Father   . Alzheimer's disease Sister     Social History   Tobacco Use  . Smoking status: Former Smoker    Packs/day: 0.25    Years: 35.00    Pack years: 8.75    Types: Cigarettes    Start date: 12/27/1977    Quit date: 05/26/2010    Years since quitting: 9.6  . Smokeless tobacco: Never Used  Vaping Use  .  Vaping Use: Never assessed  Substance Use Topics  . Alcohol use: No    Alcohol/week: 0.0 standard drinks  . Drug use: No    Home Medications Prior to Admission medications   Medication Sig Start Date End Date Taking? Authorizing Provider  allopurinol (ZYLOPRIM) 300 MG tablet Take 1 tablet (300 mg total) by mouth daily. 07/29/19   Wurst, [place], PA-C  apixaban (ELIQUIS) 5 MG TABS tablet Take 1 tablet (5 mg total) by mouth 2 (two) times daily. 12/31/19   Marinus Maw, MD  Chlorpheniramine-Acetaminophen (CORICIDIN HBP COLD/FLU PO) Take 1 tablet by mouth 2 (two) times daily. As needed    [provider]  dextromethorphan 15 MG/5ML syrup Take 10 mLs by mouth 4 (four) times daily as needed for cough.    [provider]  digoxin (LANOXIN) 0.125 MG tablet Take 0.5 tablets (0.0625 mg total) by mouth daily. 10/30/19   Marinus Maw, MD  diltiazem (CARDIZEM CD) 240 MG 24 hr capsule Take 1 capsule (240 mg total) by mouth daily. 07/29/19   Wurst, [place], PA-C  donepezil (ARICEPT) 5 MG tablet Take 1 tablet (5 mg total) by mouth at bedtime. 07/29/19   Wurst, [place], PA-C  hydrocortisone (ANUSOL-HC) 2.5 % rectal cream Place 1 application rectally 2 (two) times daily. 12/27/19   Lorelee New, PA-C  metoprolol succinate (TOPROL-XL) 100 MG 24 hr tablet Take 1 tablet (100 mg total) by mouth daily. Take with or immediately following a meal. 09/28/19   Corum, Minerva Fester, MD    Allergies    Bee venom and Codeine  Review of Systems   Review of Systems  Gastrointestinal: Positive for abdominal pain.  All other systems reviewed and are negative.   Physical Exam Updated Vital Signs BP 125/65 (BP Location: Left Arm)   Pulse 60   Temp 97.6 F (36.4 C) (Oral)   Resp 18   Ht 5\' 6"  (1.676 m)   Wt 72 kg   SpO2 98%   BMI 25.62 kg/m   Physical Exam Vitals and nursing note reviewed.  Constitutional:      General: She is not in acute distress.    Appearance: She is well-developed. She is not diaphoretic.  HENT:     Head: Normocephalic and atraumatic.  Cardiovascular:     Rate and Rhythm: Normal rate and regular rhythm.     Heart sounds: No murmur heard.  No friction rub. No gallop.   Pulmonary:     Effort: Pulmonary effort is normal. No respiratory distress.     Breath sounds: Normal breath sounds. No wheezing.  Abdominal:     General: Bowel sounds are normal. There is no distension.     Palpations: Abdomen is soft.     Tenderness: There is abdominal tenderness in the suprapubic area. There is no right CVA tenderness, left CVA tenderness, guarding or rebound.    Musculoskeletal:        General: Normal range of motion.     Cervical back: Normal range of motion and neck supple.  Skin:    General: Skin is warm and dry.  Neurological:     Mental Status: She is alert and oriented to person, place, and time.     ED Results / Procedures / Treatments   Labs (all labs ordered are listed, but only abnormal results are displayed) Labs Reviewed  LIPASE, BLOOD  COMPREHENSIVE METABOLIC PANEL  CBC  URINALYSIS, ROUTINE W REFLEX MICROSCOPIC    EKG None  Radiology No  results found.  Procedures Procedures (including critical care time)  Medications Ordered in ED Medications  sodium chloride flush (NS) 0.9 % injection 3 mL (has no administration in time range)  sodium chloride 0.9 % bolus 500 mL (has no administration in time range)    ED Course  I have reviewed the triage vital signs and the nursing notes.  Pertinent labs & imaging results that were available during my care of the patient were reviewed by me and considered in my medical decision making (see chart for details).    MDM Rules/Calculators/A&P  Patient presenting here with complaints of abdominal pain, vomiting, and diarrhea x1.  Patient vital signs stable upon arrival.  She has no white count and abdomen is benign, but there is tenderness to the left lower quadrant.  Her CT scan does show what appears to be diverticulitis.  She will be treated with Cipro and Flagyl and return to her extended care facility.  Final Clinical Impression(s) / ED Diagnoses Final diagnoses:  None    Rx / DC Orders ED Discharge Orders    None       Geoffery Lyons, MD 01/05/20 1343

## 2020-01-15 ENCOUNTER — Other Ambulatory Visit: Payer: Self-pay | Admitting: Family Medicine

## 2020-03-17 ENCOUNTER — Emergency Department (HOSPITAL_COMMUNITY): Payer: Medicare Other

## 2020-03-17 ENCOUNTER — Encounter (HOSPITAL_COMMUNITY): Payer: Self-pay

## 2020-03-17 ENCOUNTER — Emergency Department (HOSPITAL_COMMUNITY)
Admission: EM | Admit: 2020-03-17 | Discharge: 2020-03-17 | Disposition: A | Discharge status: Home / Self Care | Payer: Medicare Other | Attending: Emergency Medicine | Admitting: Emergency Medicine

## 2020-03-17 ENCOUNTER — Other Ambulatory Visit: Payer: Self-pay

## 2020-03-17 DIAGNOSIS — W19XXXA Unspecified fall, initial encounter: Secondary | ICD-10-CM

## 2020-03-17 DIAGNOSIS — Z87891 Personal history of nicotine dependence: Secondary | ICD-10-CM | POA: Insufficient documentation

## 2020-03-17 DIAGNOSIS — Z7901 Long term (current) use of anticoagulants: Secondary | ICD-10-CM | POA: Insufficient documentation

## 2020-03-17 DIAGNOSIS — J449 Chronic obstructive pulmonary disease, unspecified: Secondary | ICD-10-CM | POA: Insufficient documentation

## 2020-03-17 DIAGNOSIS — I10 Essential (primary) hypertension: Secondary | ICD-10-CM | POA: Insufficient documentation

## 2020-03-17 DIAGNOSIS — W010XXA Fall on same level from slipping, tripping and stumbling without subsequent striking against object, initial encounter: Secondary | ICD-10-CM | POA: Diagnosis not present

## 2020-03-17 DIAGNOSIS — S4991XA Unspecified injury of right shoulder and upper arm, initial encounter: Secondary | ICD-10-CM | POA: Diagnosis present

## 2020-03-17 DIAGNOSIS — Z79899 Other long term (current) drug therapy: Secondary | ICD-10-CM | POA: Diagnosis not present

## 2020-03-17 DIAGNOSIS — M25561 Pain in right knee: Secondary | ICD-10-CM | POA: Diagnosis not present

## 2020-03-17 DIAGNOSIS — Y9301 Activity, walking, marching and hiking: Secondary | ICD-10-CM | POA: Insufficient documentation

## 2020-03-17 DIAGNOSIS — S40011A Contusion of right shoulder, initial encounter: Secondary | ICD-10-CM | POA: Diagnosis not present

## 2020-03-17 DIAGNOSIS — F0391 Unspecified dementia with behavioral disturbance: Secondary | ICD-10-CM | POA: Diagnosis not present

## 2020-03-17 DIAGNOSIS — S0990XA Unspecified injury of head, initial encounter: Secondary | ICD-10-CM | POA: Insufficient documentation

## 2020-03-17 DIAGNOSIS — Z950 Presence of cardiac pacemaker: Secondary | ICD-10-CM | POA: Diagnosis not present

## 2020-03-17 HISTORY — DX: Essential (primary) hypertension: I10

## 2020-03-17 HISTORY — DX: Disorder of kidney and ureter, unspecified: N28.9

## 2020-03-17 MED ORDER — ACETAMINOPHEN 325 MG PO TABS
650.0000 mg | ORAL_TABLET | Freq: Once | ORAL | Status: AC
  Administered 2020-03-17: 650 mg via ORAL
  Filled 2020-03-17: qty 2

## 2020-03-17 MED ORDER — ACETAMINOPHEN 325 MG PO TABS: 650.0000 mg | ORAL_TABLET | Freq: Four times a day (QID) | ORAL | 0 refills | Status: AC | PRN

## 2020-03-17 NOTE — Discharge Instructions (Addendum)
Your xrays and head CT scan are negative tonight for any acute injury including no fractures or dislocations.  I recommend application of an ice pack as much as is comfortable to your shoulder and your knee for the next several days.  I also recommend taking tylenol 650 mg every 6 hours for pain relief.  This has been prescribed for you if needed for your caregivers, but you can also buy this without a prescription.

## 2020-03-17 NOTE — ED Notes (Signed)
Called and spoke with Dianna Limbo and informed her that patient was being transported out now. She stated that she was there waiting for her.

## 2020-03-17 NOTE — ED Triage Notes (Signed)
Pt from [facility] assisted living.  Pt walks with walker usually.  Reports walked a short distance without her walker today, lost her balance, and fell.  Pt fell onto R shoulder.   EMS reports r shoulder swollen and r knee swollen.  Areas tender to touch.  Radial pulse present.

## 2020-03-17 NOTE — ED Provider Notes (Signed)
[facility] EMERGENCY DEPARTMENT Provider Note   CSN: [id number] Arrival date & time: 03/17/20  1524     History Chief Complaint  Patient presents with  . Fall    [name] is a 86 y.o. female with a history of arthritis, chronic knee pain, a fib on Eliquis, and dementia presenting from her local nursing home after sustaining a fall. She generally walks using a walker, today tried to walk a short distance without the walker, lost her balance and fell landing on her right side.  She has complaint of right shoulder and right knee pain, also describes pain in the left shoulder as well.  She denies headache or hitting her head during the fall, no neck or back pain.  She is swollen along her right lateral shoulder.  She has had no treatment prior to arrival.      HPI     Past Medical History:  Diagnosis Date  . Anxiety   . Arthritis   . Bilateral chronic knee pain   . Brain aneurysm   . Brain aneurysm   . Brain aneurysm   . COPD (chronic obstructive pulmonary disease) (HCC)   . DDD (degenerative disc disease), lumbar   . Dementia (HCC)   . DJD (degenerative joint disease)   . DJD (degenerative joint disease)   . DVT (deep venous thrombosis) (HCC)   . Gout   . Hyperlipidemia   . Hypertension   . Peripheral neuropathy   . Permanent atrial fibrillation [facility])    MDT [id number] pacemaker 03-26-2013 by Dr Ladona Ridgel  . Pulmonary embolism (HCC)   . Renal disorder     Patient Active Problem List   Diagnosis Date Noted  . Hemorrhoids 10/06/2019  . At high risk for injury related to fall 08/27/2019  . Keratotic lesion 07/22/2019  . Hypokalemia 12/05/2016  . Dementia with behavioral disturbance (HCC) 04/25/2016  . Noncompliance with medication regimen 04/24/2016  . UTI (urinary tract infection) 04/18/2016  . Chest pain 02/10/2014  . Edema 02/10/2014  . Encounter for therapeutic drug monitoring 08/13/2013  . Chronic anticoagulation 07/22/2010  . HYPOTENSION, ORTHOSTATIC  06/15/2010  . Essential hypertension, benign 04/06/2009  . Cardiac pacemaker in situ 04/06/2009  . PULMONARY EMBOLISM 01/18/2009  . ATRIAL FIBRILLATION, CHRONIC 01/18/2009  . BRADYCARDIA-TACHYCARDIA SYNDROME 01/18/2009  . DVT 01/18/2009    Past Surgical History:  Procedure Laterality Date  . CARDIAC CATHETERIZATION  2001  . Craniotomy clipping  06-2002   unruptured  . Left paraclinoid segment anurysum     [facility]  . PACEMAKER INSERTION  09-13-1999; 03-26-2013   PPM gen change by Dr Ladona Ridgel 03-26-2013 - MDT [id number]  . PERMANENT PACEMAKER GENERATOR CHANGE N/A 03/26/2013   Procedure: PERMANENT PACEMAKER GENERATOR CHANGE;  Surgeon: Marinus Maw, MD;  Location: [facility] CATH LAB;  Service: Cardiovascular;  Laterality: N/A;     OB History   No obstetric history on file.     Family History  Problem Relation Age of Onset  . Heart attack Mother   . Heart attack Father   . Alzheimer's disease Sister     Social History   Tobacco Use  . Smoking status: Former Smoker    Packs/day: 0.25    Years: 35.00    Pack years: 8.75    Types: Cigarettes    Start date: 12/27/1977    Quit date: 05/26/2010    Years since quitting: 9.8  . Smokeless tobacco: Never Used  Vaping Use  . Vaping  Use: Never assessed  Substance Use Topics  . Alcohol use: No    Alcohol/week: 0.0 standard drinks  . Drug use: No    Home Medications Prior to Admission medications   Medication Sig Start Date End Date Taking? Authorizing Provider  acetaminophen (TYLENOL) 325 MG tablet Take 2 tablets (650 mg total) by mouth every 6 (six) hours as needed for up to 5 days for moderate pain. 03/17/20 03/22/20  Burgess Amor, PA-C  allopurinol (ZYLOPRIM) 300 MG tablet Take 1 tablet (300 mg total) by mouth daily. 07/29/19   Wurst, [place], PA-C  apixaban (ELIQUIS) 5 MG TABS tablet Take 1 tablet (5 mg total) by mouth 2 (two) times daily. 12/31/19   Marinus Maw, MD  Chlorpheniramine-Acetaminophen (CORICIDIN  HBP COLD/FLU PO) Take 1 tablet by mouth 2 (two) times daily. As needed    [provider]  ciprofloxacin (CIPRO) 500 MG tablet Take 1 tablet (500 mg total) by mouth 2 (two) times daily. One po bid x 7 days 01/05/20   Geoffery Lyons, MD  dextromethorphan 15 MG/5ML syrup Take 10 mLs by mouth 4 (four) times daily as needed for cough.    [provider]  digoxin (LANOXIN) 0.125 MG tablet Take 0.5 tablets (0.0625 mg total) by mouth daily. 10/30/19   Marinus Maw, MD  diltiazem (CARDIZEM CD) 240 MG 24 hr capsule Take 1 capsule (240 mg total) by mouth daily. 07/29/19   Wurst, [place], PA-C  donepezil (ARICEPT) 5 MG tablet Take 1 tablet (5 mg total) by mouth at bedtime. 07/29/19   Wurst, [place], PA-C  hydrocortisone (ANUSOL-HC) 2.5 % rectal cream Place 1 application rectally 2 (two) times daily. 12/27/19   Lorelee New, PA-C  metoprolol succinate (TOPROL-XL) 100 MG 24 hr tablet Take 1 tablet (100 mg total) by mouth daily. Take with or immediately following a meal. 09/28/19   Corum, Minerva Fester, MD  metroNIDAZOLE (FLAGYL) 500 MG tablet Take 1 tablet (500 mg total) by mouth 3 (three) times daily. 01/05/20   Geoffery Lyons, MD    Allergies    Bee venom and Codeine  Review of Systems   Review of Systems  Constitutional: Negative for fever.  HENT: Negative.   Respiratory: Negative.   Cardiovascular: Negative.   Gastrointestinal: Negative.   Musculoskeletal: Positive for arthralgias and joint swelling. Negative for myalgias.  Skin: Negative for wound.  Neurological: Negative for weakness, numbness and headaches.  All other systems reviewed and are negative.   Physical Exam Updated Vital Signs BP (!) 143/71 (BP Location: Right Wrist)   Pulse (!) 59   Temp 97.7 F (36.5 C) (Oral)   Ht 5\' 9"  (1.753 m)   Wt 72 kg   SpO2 100%   BMI 23.44 kg/m   Physical Exam Vitals and nursing note reviewed.  Constitutional:      Appearance: She is well-developed.  HENT:     Head: Normocephalic  and atraumatic.  Eyes:     Extraocular Movements: Extraocular movements intact.     Pupils: Pupils are equal, round, and reactive to light.  Cardiovascular:     Rate and Rhythm: Normal rate.     Comments: Pulses equal bilaterally Pulmonary:     Effort: Pulmonary effort is normal.  Abdominal:     Palpations: Abdomen is soft.     Tenderness: There is no abdominal tenderness. There is no guarding.  Musculoskeletal:        General: Swelling and tenderness present.     Right shoulder: Swelling and  tenderness present. No deformity or effusion.     Left shoulder: Tenderness present. No swelling.     Right wrist: Normal pulse.     Left wrist: Normal pulse.     Cervical back: Normal range of motion.     Right knee: Bony tenderness present. No swelling, deformity, ecchymosis or crepitus.     Comments: Mild edema noted lateral right shoulder, abrasion. No palpable deformity or dislocation.  Early bruising noted.  Skin:    General: Skin is warm and dry.  Neurological:     Mental Status: She is alert.     Sensory: No sensory deficit.     Deep Tendon Reflexes: Reflexes normal.     ED Results / Procedures / Treatments   Labs (all labs ordered are listed, but only abnormal results are displayed) Labs Reviewed - No data to display  EKG None  Radiology DG Shoulder Right  Result Date: 03/17/2020 CLINICAL DATA:  Larey SeatFell, bilateral shoulder pain EXAM: RIGHT SHOULDER - 2+ VIEW COMPARISON:  None. FINDINGS: Frontal and transscapular views of the right shoulder demonstrate severe glenohumeral and acromioclavicular joint osteoarthritis. The humeral head is high-riding within the glenoid fossa, with significant narrowing of the acromial humeral interval consistent with chronic longstanding rotator cuff tear. There are no acute fractures. Right chest is clear. IMPRESSION: 1. Severe degenerative changes of the right shoulder. No acute fracture. Electronically Signed   By: Sharlet SalinaMichael  Brown M.D.   On:  03/17/2020 17:12   CT Head Wo Contrast  Result Date: 03/17/2020 CLINICAL DATA:  86 year old female status post fall at home today. EXAM: CT HEAD WITHOUT CONTRAST TECHNIQUE: Contiguous axial images were obtained from the base of the skull through the vertex without intravenous contrast. COMPARISON:  Head CT 05/27/2017. FINDINGS: Brain: Chronic surgical aneurysm clip at the level of the distal left ICA appears stable and configuration. Associated metal streak artifact as before. Chronic encephalomalacia in the anterior left frontal and temporal lobes. Stable cerebral volume. No midline shift, ventriculomegaly, mass effect, evidence of mass lesion, intracranial hemorrhage or evidence of cortically based acute infarction. Stable gray-white matter differentiation throughout the brain. Vascular: Previous distal left ICA aneurysm clipping. Calcified atherosclerosis at the skull base. Skull: Chronic left frontotemporal craniotomy is stable. No acute osseous abnormality identified. Sinuses/Orbits: Visualized paranasal sinuses and mastoids are stable and well pneumatized. Other: No acute orbit or scalp soft tissue finding. IMPRESSION: 1. No acute intracranial abnormality or acute traumatic injury identified. 2. Stable brain with previous distal left ICA aneurysm clipping and associated chronic left frontal and temporal lobe encephalomalacia. Electronically Signed   By: Odessa FlemingH  Hall M.D.   On: 03/17/2020 17:22   DG Shoulder Left  Result Date: 03/17/2020 CLINICAL DATA:  Larey SeatFell, pain EXAM: LEFT SHOULDER - 2+ VIEW COMPARISON:  05/13/2018 FINDINGS: Frontal and transscapular views of the left shoulder demonstrate severe glenohumeral and acromioclavicular joint osteoarthritis. The humeral head is high-riding within the glenoid fossa, with severe acromial humeral narrowing consistent with chronic longstanding rotator cuff tear. No fracture, subluxation, or dislocation. Left chest is clear. IMPRESSION: 1. Severe degenerative  changes.  No acute fracture. Electronically Signed   By: Sharlet SalinaMichael  Brown M.D.   On: 03/17/2020 17:17   DG Knee Complete 4 Views Right  Result Date: 03/17/2020 CLINICAL DATA:  Larey SeatFell, right knee pain EXAM: RIGHT KNEE - COMPLETE 4+ VIEW COMPARISON:  08/24/2005 FINDINGS: Frontal, bilateral oblique, lateral views of the right knee demonstrate no fractures. Alignment is anatomic. There is 3 compartmental osteoarthritis greatest in the medial  and patellofemoral compartments. No joint effusion. Soft tissues are normal. IMPRESSION: 1. Osteoarthritis.  No acute fracture. Electronically Signed   By: Sharlet Salina M.D.   On: 03/17/2020 17:15   DG HIPS BILAT WITH PELVIS MIN 5 VIEWS  Result Date: 03/17/2020 CLINICAL DATA:  Larey Seat, hip pain EXAM: DG HIP (WITH OR WITHOUT PELVIS) 5+V BILAT COMPARISON:  01/05/2018 FINDINGS: Frontal view of the pelvis as well as frontal and frogleg lateral views of the bilateral hips are obtained. There is severe bilateral hip osteoarthritis, left greater than right, with complete loss of joint space, marked marginal osteophyte formation, eburnation, and subchondral cyst formation. There are no acute displaced fractures. Bilateral sacroiliac joints are unremarkable. There is severe lower lumbar spondylosis. IMPRESSION: 1. Progressive severe bilateral hip osteoarthritis. No acute fracture. Electronically Signed   By: Sharlet Salina M.D.   On: 03/17/2020 17:19    Procedures Procedures (including critical care time)  Medications Ordered in ED Medications  acetaminophen (TYLENOL) tablet 650 mg (650 mg Oral Given 03/17/20 1743)    ED Course  I have reviewed the triage vital signs and the nursing notes.  Pertinent labs & imaging results that were available during my care of the patient were reviewed by me and considered in my medical decision making (see chart for details).    MDM Rules/Calculators/A&P                          Imaging reviewed, CT head also including given age and  on chronic Eliquis, no acute findings.  Ice packs, tylenol prn pain relief, plan f/u with pcp for a recheck for any persistent sx.  No fractures/dislocation noted on todays imaging.  Pt seen by Dr Jacqulyn Bath prior to dc home.  Final Clinical Impression(s) / ED Diagnoses Final diagnoses:  Fall, initial encounter  Contusion of right shoulder, initial encounter  Acute pain of right knee    Rx / DC Orders ED Discharge Orders         Ordered    acetaminophen (TYLENOL) 325 MG tablet  Every 6 hours PRN        03/17/20 1750           Burgess Amor, PA-C 03/17/20 1751    Long, Arlyss Repress, MD 03/18/20 [phone]

## 2020-03-17 NOTE — ED Notes (Signed)
Pt provided with graham crackers and ice water. Tolerating well. No other complaints at this time. Will continue to monitor.

## 2020-03-17 NOTE — ED Notes (Signed)
Assisted pt to bathroom. Pt was able to ambulate in bathroom with standby assist. Gait slow, but steady.

## 2020-04-14 ENCOUNTER — Encounter (HOSPITAL_COMMUNITY): Payer: Self-pay

## 2020-04-14 ENCOUNTER — Emergency Department (HOSPITAL_COMMUNITY): Payer: Medicare Other

## 2020-04-14 ENCOUNTER — Emergency Department (HOSPITAL_COMMUNITY)
Admission: EM | Admit: 2020-04-14 | Discharge: 2020-04-14 | Disposition: A | Discharge status: Home / Self Care | Payer: Medicare Other | Attending: Emergency Medicine | Admitting: Emergency Medicine

## 2020-04-14 ENCOUNTER — Other Ambulatory Visit: Payer: Self-pay

## 2020-04-14 DIAGNOSIS — W1839XA Other fall on same level, initial encounter: Secondary | ICD-10-CM | POA: Insufficient documentation

## 2020-04-14 DIAGNOSIS — S0990XA Unspecified injury of head, initial encounter: Secondary | ICD-10-CM | POA: Diagnosis present

## 2020-04-14 DIAGNOSIS — F0390 Unspecified dementia without behavioral disturbance: Secondary | ICD-10-CM | POA: Diagnosis not present

## 2020-04-14 DIAGNOSIS — J449 Chronic obstructive pulmonary disease, unspecified: Secondary | ICD-10-CM | POA: Diagnosis not present

## 2020-04-14 DIAGNOSIS — Z87891 Personal history of nicotine dependence: Secondary | ICD-10-CM | POA: Insufficient documentation

## 2020-04-14 DIAGNOSIS — Y92002 Bathroom of unspecified non-institutional (private) residence single-family (private) house as the place of occurrence of the external cause: Secondary | ICD-10-CM | POA: Diagnosis not present

## 2020-04-14 DIAGNOSIS — I10 Essential (primary) hypertension: Secondary | ICD-10-CM | POA: Insufficient documentation

## 2020-04-14 DIAGNOSIS — Z7901 Long term (current) use of anticoagulants: Secondary | ICD-10-CM | POA: Diagnosis not present

## 2020-04-14 DIAGNOSIS — Z79899 Other long term (current) drug therapy: Secondary | ICD-10-CM | POA: Insufficient documentation

## 2020-04-14 DIAGNOSIS — S0181XA Laceration without foreign body of other part of head, initial encounter: Secondary | ICD-10-CM | POA: Insufficient documentation

## 2020-04-14 MED ORDER — LIDOCAINE HCL (PF) 1 % IJ SOLN
5.0000 mL | Freq: Once | INTRAMUSCULAR | Status: AC
  Administered 2020-04-14: 5 mL
  Filled 2020-04-14: qty 30

## 2020-04-14 MED ORDER — BACITRACIN ZINC 500 UNIT/GM EX OINT
TOPICAL_OINTMENT | Freq: Once | CUTANEOUS | Status: AC
  Administered 2020-04-14: 1 via TOPICAL
  Filled 2020-04-14: qty 0.9

## 2020-04-14 NOTE — ED Provider Notes (Signed)
[facility] EMERGENCY DEPARTMENT Provider Note   CSN: [id number] Arrival date & time: 04/14/20  1525     History Chief Complaint  Patient presents with  . Laceration    [name] is a 86 y.o. female. Level 5 caveat due to dementia. HPI patient reportedly brought in after a fall in bathroom.  Laceration to left forehead.  Patient has dementia and can't really provide much history.  Without complaints at this time however.  Patient is on Eliquis.  Denies chest pain.  Denies abdominal pain.  Denies numbness or weakness or neck pain.    Past Medical History:  Diagnosis Date  . Arthritis   . Bilateral chronic knee pain   . Brain aneurysm   . Brain aneurysm   . Brain aneurysm   . COPD (chronic obstructive pulmonary disease) (HCC)   . DDD (degenerative disc disease), lumbar   . Dementia (HCC)   . DJD (degenerative joint disease)   . DJD (degenerative joint disease)   . DVT (deep venous thrombosis) (HCC)   . Gout   . Hypertension   . Peripheral neuropathy   . Permanent atrial fibrillation [facility])    MDT [id number] pacemaker 03-26-2013 by Dr Ladona Ridgel  . Pulmonary embolism (HCC)   . Renal disorder     Patient Active Problem List   Diagnosis Date Noted  . Hemorrhoids 10/06/2019  . At high risk for injury related to fall 08/27/2019  . Keratotic lesion 07/22/2019  . Hypokalemia 12/05/2016  . Dementia with behavioral disturbance (HCC) 04/25/2016  . Noncompliance with medication regimen 04/24/2016  . UTI (urinary tract infection) 04/18/2016  . Chest pain 02/10/2014  . Edema 02/10/2014  . Encounter for therapeutic drug monitoring 08/13/2013  . Chronic anticoagulation 07/22/2010  . HYPOTENSION, ORTHOSTATIC 06/15/2010  . Essential hypertension, benign 04/06/2009  . Cardiac pacemaker in situ 04/06/2009  . PULMONARY EMBOLISM 01/18/2009  . ATRIAL FIBRILLATION, CHRONIC 01/18/2009  . BRADYCARDIA-TACHYCARDIA SYNDROME 01/18/2009  . DVT 01/18/2009    Past Surgical History:   Procedure Laterality Date  . CARDIAC CATHETERIZATION  2001  . Craniotomy clipping  06-2002   unruptured  . Left paraclinoid segment anurysum     [facility]  . PACEMAKER INSERTION  09-13-1999; 03-26-2013   PPM gen change by Dr Ladona Ridgel 03-26-2013 - MDT [id number]  . PERMANENT PACEMAKER GENERATOR CHANGE N/A 03/26/2013   Procedure: PERMANENT PACEMAKER GENERATOR CHANGE;  Surgeon: Marinus Maw, MD;  Location: [facility] C[facility] LAB;  Service: Cardiovascular;  Laterality: N/A;     OB History   No obstetric history on file.     Family History  Problem Relation Age of Onset  . Heart attack Mother   . Heart attack Father   . Alzheimer's disease Sister     Social History   Tobacco Use  . Smoking status: Former Smoker    Packs/day: 0.25    Years: 35.00    Pack years: 8.75    Types: Cigarettes    Start date: 12/27/1977    Quit date: 05/26/2010    Years since quitting: 9.8  . Smokeless tobacco: Never Used  Substance Use Topics  . Alcohol use: No    Alcohol/week: 0.0 standard drinks  . Drug use: No    Home Medications Prior to Admission medications   Medication Sig Start Date End Date Taking? Authorizing Provider  allopurinol (ZYLOPRIM) 300 MG tablet Take 1 tablet (300 mg total) by mouth daily. 07/29/19   Wurst, [place], PA-C  apixaban (ELIQUIS) 5  MG TABS tablet Take 1 tablet (5 mg total) by mouth 2 (two) times daily. 12/31/19   Marinus Maw, MD  Chlorpheniramine-Acetaminophen (CORICIDIN HBP COLD/FLU PO) Take 1 tablet by mouth 2 (two) times daily. As needed    [provider]  ciprofloxacin (CIPRO) 500 MG tablet Take 1 tablet (500 mg total) by mouth 2 (two) times daily. One po bid x 7 days 01/05/20   Geoffery Lyons, MD  dextromethorphan 15 MG/5ML syrup Take 10 mLs by mouth 4 (four) times daily as needed for cough.    [provider]  digoxin (LANOXIN) 0.125 MG tablet Take 0.5 tablets (0.0625 mg total) by mouth daily. 10/30/19   Marinus Maw, MD   diltiazem (CARDIZEM CD) 240 MG 24 hr capsule Take 1 capsule (240 mg total) by mouth daily. 07/29/19   Wurst, [place], PA-C  donepezil (ARICEPT) 5 MG tablet Take 1 tablet (5 mg total) by mouth at bedtime. 07/29/19   Wurst, [place], PA-C  hydrocortisone (ANUSOL-HC) 2.5 % rectal cream Place 1 application rectally 2 (two) times daily. 12/27/19   Lorelee New, PA-C  metoprolol succinate (TOPROL-XL) 100 MG 24 hr tablet Take 1 tablet (100 mg total) by mouth daily. Take with or immediately following a meal. 09/28/19   Corum, Minerva Fester, MD  metroNIDAZOLE (FLAGYL) 500 MG tablet Take 1 tablet (500 mg total) by mouth 3 (three) times daily. 01/05/20   Geoffery Lyons, MD    Allergies    Bee venom and Codeine  Review of Systems   Review of Systems  Unable to perform ROS: Dementia    Physical Exam Updated Vital Signs BP 134/61 (BP Location: Left Arm)   Pulse 60   Temp 97.7 F (36.5 C) (Oral)   Resp 18   Ht 5\' 9"  (1.753 m)   Wt 72 kg   SpO2 100%   BMI 23.44 kg/m   Physical Exam Vitals and nursing note reviewed.  Constitutional:      Appearance: Normal appearance.  HENT:     Head:     Comments: Small hematoma left forehead.  1 cm laceration left upper forehead.     Nose: Nose normal.  Eyes:     Extraocular Movements: Extraocular movements intact.     Pupils: Pupils are equal, round, and reactive to light.  Neck:     Comments: No midline tenderness.  Painless range of motion. Pulmonary:     Breath sounds: No wheezing or rhonchi.  Chest:     Chest wall: No tenderness.  Abdominal:     Tenderness: There is no abdominal tenderness.  Musculoskeletal:        General: No tenderness.     Cervical back: Neck supple.     Comments: No tenderness to upper or lower extremities.  Good range of motion.  Skin:    General: Skin is warm.     Capillary Refill: Capillary refill takes less than 2 seconds.  Neurological:     Mental Status: She is alert.     Comments: Awake and pleasant, but confusion.      ED Results / Procedures / Treatments   Labs (all labs ordered are listed, but only abnormal results are displayed) Labs Reviewed - No data to display  EKG None  Radiology CT Head Wo Contrast  Result Date: 04/14/2020 CLINICAL DATA:  Status post fall. EXAM: CT HEAD WITHOUT CONTRAST TECHNIQUE: Contiguous axial images were obtained from the base of the skull through the vertex without intravenous contrast. COMPARISON:  March 17, 2020 FINDINGS: Brain: There is mild cerebral atrophy with widening of the extra-axial spaces and ventricular dilatation. There are areas of decreased attenuation within the white matter tracts of the supratentorial brain, consistent with microvascular disease changes. A stable areas of cortical encephalomalacia, with adjacent chronic white matter low attenuation, are seen within the left temporal and left frontal lobes. Vascular: A metallic density aneurysm clip is seen along the distal left ICA. Skull: Left temporal and left frontal craniotomy defects are seen. Sinuses/Orbits: No acute finding. Small metallic density surgical screws are seen within the superior wall of the left orbit. Other: Mild left frontal scalp soft tissue swelling is seen. IMPRESSION: 1. Mild left frontal scalp soft tissue swelling without an acute intracranial abnormality. 2. Prior left ICA aneurysm clipping with stable left frontal lobe and left temporal lobe cortical encephalomalacia. Electronically Signed   By: Aram Candela M.D.   On: 04/14/2020 17:19    Procedures Procedures (including critical care time)  Medications Ordered in ED Medications  lidocaine (PF) (XYLOCAINE) 1 % injection 5 mL (5 mLs Infiltration Given 04/14/20 1637)  bacitracin ointment (1 application Topical Given 04/14/20 1822)    ED Course  I have reviewed the triage vital signs and the nursing notes.  Pertinent labs & imaging results that were available during my care of the patient were reviewed by me and  considered in my medical decision making (see chart for details).    MDM Rules/Calculators/A&P                          Patient brought in for what had reportedly been a mechanical fall.  Patient however cannot provide much history.  Hematoma to forehead and laceration of her head.  On Eliquis.  Head CT done and reassuring.  Wound closed.  Will follow up to have sutures out in around 5 days and instructed to follow-up with cardiology to evaluate risk and benefit of anticoagulation with her recent falls discharge home Final Clinical Impression(s) / ED Diagnoses Final diagnoses:  Injury of head, initial encounter  Laceration of forehead, initial encounter    Rx / DC Orders ED Discharge Orders    None       Benjiman Core, MD 04/14/20 1849

## 2020-04-14 NOTE — Discharge Instructions (Signed)
Follow-up with Dr. Ladona Ridgel.  With the falls the need for anticoagulation needs to be evaluated against the risk of the falls. Have the stitches taken out in around 5 days.

## 2020-04-14 NOTE — ED Provider Notes (Signed)
..  Laceration Repair  Date/Time: 04/14/2020 6:05 PM Performed by: Bill Salinas, PA-C Authorized by: Bill Salinas, PA-C   Consent:    Consent obtained:  Verbal   Consent given by:  Patient   Risks, benefits, and alternatives were discussed: yes     Risks discussed:  Infection, pain, poor cosmetic result, vascular damage, retained foreign body, poor wound healing, need for additional repair and nerve damage Universal protocol:    Procedure explained and questions answered to patient or proxy's satisfaction: yes     Relevant documents present and verified: yes     Test results available: yes     Imaging studies available: yes     Site/side marked: yes     Immediately prior to procedure, a time out was called: yes     Patient identity confirmed:  Verbally with patient and arm band Anesthesia:    Anesthesia method:  Local infiltration   Local anesthetic:  Lidocaine 1% w/o epi Laceration details:    Location:  Face   Face location:  Forehead   Length (cm):  1   Depth (mm):  3 Pre-procedure details:    Preparation:  Patient was prepped and draped in usual sterile fashion and imaging obtained to evaluate for foreign bodies Exploration:    Hemostasis achieved with:  Direct pressure   Imaging outcome: foreign body not noted     Wound exploration: wound explored through full range of motion and entire depth of wound visualized     Wound extent: no foreign bodies/material noted, no muscle damage noted, no nerve damage noted, no tendon damage noted, no underlying fracture noted and no vascular damage noted     Contaminated: no   Treatment:    Area cleansed with:  Povidone-iodine and saline   Amount of cleaning:  Standard   Irrigation solution:  Sterile saline   Irrigation method:  Pressure wash Skin repair:    Repair method:  Sutures   Suture size:  5-0   Suture material:  Prolene   Suture technique:  Simple interrupted   Number of sutures:  2 Approximation:     Approximation:  Close Repair type:    Repair type:  Simple Post-procedure details:    Dressing:  Non-adherent dressing, sterile dressing and antibiotic ointment   Procedure completion:  Tolerated well, no immediate complications Comments:     Laceration repaired. Irrigation and dressing by nursing staff. Remainder of patient care per Dr. Rubin Payor.    Note: Portions of this report may have been transcribed using voice recognition software. Every effort was made to ensure accuracy; however, inadvertent computerized transcription errors may still be present.    Elizabeth Palau 04/14/20 Crissie Figures, MD 04/14/20 [phone]

## 2020-04-14 NOTE — ED Triage Notes (Signed)
Pt brought in by EMS due to fall in [place] . Pt has laceration to left forehead. Pt unsure what happened

## 2020-04-19 ENCOUNTER — Other Ambulatory Visit: Payer: Self-pay | Admitting: Internal Medicine

## 2020-04-19 DIAGNOSIS — I4891 Unspecified atrial fibrillation: Secondary | ICD-10-CM

## 2020-04-20 NOTE — Telephone Encounter (Signed)
Prescription refill request for Eliquis received.  Indication: afib,  pe Last office visit: Ladona Ridgel 12/08/2019 Scr: 0.89, 01/05/2020 Age: 86 yo Weight: 72 kg   Prescription refill sent.

## 2020-08-12 ENCOUNTER — Ambulatory Visit: Payer: Self-pay | Admitting: Urology

## 2020-08-17 ENCOUNTER — Other Ambulatory Visit: Payer: Self-pay

## 2020-08-17 ENCOUNTER — Ambulatory Visit: Payer: Medicare Other | Admitting: Urology

## 2020-08-17 ENCOUNTER — Encounter: Payer: Self-pay | Admitting: Urology

## 2020-08-17 VITALS — BP 105/65 | HR 60 | Ht 68.0 in | Wt 148.0 lb

## 2020-08-17 DIAGNOSIS — N390 Urinary tract infection, site not specified: Secondary | ICD-10-CM | POA: Diagnosis not present

## 2020-08-17 DIAGNOSIS — R300 Dysuria: Secondary | ICD-10-CM

## 2020-08-18 ENCOUNTER — Encounter: Payer: Self-pay | Admitting: Urology

## 2020-08-18 LAB — MICROSCOPIC EXAMINATION: WBC, UA: >30 /hpf — AB (ref 0–5)

## 2020-08-18 LAB — URINALYSIS, COMPLETE
Bilirubin, UA: NEGATIVE
Glucose, UA: NEGATIVE
Nitrite, UA: POSITIVE — AB
Specific Gravity, UA: 1.025 (ref 1.005–1.030)
Urobilinogen, Ur: 1 mg/dL (ref 0.2–1.0)
pH, UA: 5.5 (ref 5.0–7.5)

## 2020-08-18 NOTE — Progress Notes (Signed)
08/17/2020 7:44 AM   [name] [birth year] [id number]  Referring provider: Garnet Koyanagi, FNP [address] [place],  [place] [address]  Chief Complaint  Patient presents with  . Dysuria    HPI: [name] is a 87 y.o. female referred for evaluation of recurrent UTIs.  She resides in a family home and presents today with her caregiver who provided the majority of the history.   History recurrent UTIs over the last 1 year  Caregiver estimates she has been treated for 4 infections in the last 12 months  Record review remarkable for 7 urine cultures since March 2021 with 3 + for E. Coli  No urine cultures in Epic since August 2021  She does have chronic urinary incontinence and wears diapers  Symptoms typically consist of dysuria  Caregiver states she was last treated in late March 2022.  Referring provider saw 07/21/2020 though no urinalysis was ordered  Is taking a cranberry/probiotic supplement  Presently with mild dysuria  Notes occasional lower abdominal pain  No febrile UTIs or hospitalization for UTI treatment  Denies gross hematuria  CT abdomen pelvis 12/2019 remarkable for a left pelvic kidney.  No hydronephrosis or urinary calculi   PMH: Past Medical History:  Diagnosis Date  . Arthritis   . Bilateral chronic knee pain   . Brain aneurysm   . Brain aneurysm   . Brain aneurysm   . COPD (chronic obstructive pulmonary disease) (HCC)   . DDD (degenerative disc disease), lumbar   . Dementia (HCC)   . DJD (degenerative joint disease)   . DJD (degenerative joint disease)   . DVT (deep venous thrombosis) (HCC)   . Gout   . Hypertension   . Peripheral neuropathy   . Permanent atrial fibrillation [facility])    MDT [id number] pacemaker 03-26-2013 by [name]  . Pulmonary embolism (HCC)   . Renal disorder     Surgical History: Past Surgical History:  Procedure Laterality Date  . CARDIAC CATHETERIZATION  2001  . Craniotomy clipping   06-2002   unruptured  . Left paraclinoid segment anurysum     [facility]  . PACEMAKER INSERTION  09-13-1999; 03-26-2013   PPM gen change by [name] 03-26-2013 - MDT [id number]  . PERMANENT PACEMAKER GENERATOR CHANGE N/A 03/26/2013   Procedure: PERMANENT PACEMAKER GENERATOR CHANGE;  Surgeon: Tracy Maw, Cross;  Location: [facility] CATH LAB;  Service: Cardiovascular;  Laterality: N/A;    Home Medications:  Allergies as of 08/17/2020      Reactions   Bee Venom Swelling   Codeine    REACTION: GI distress      Medication List       Accurate as of August 17, 2020 11:59 PM. If you have any questions, ask your nurse or doctor.        STOP taking these medications   ciprofloxacin 500 MG tablet Commonly known as: Cipro Stopped by: [name]     TAKE these medications   allopurinol 300 MG tablet Commonly known as: ZYLOPRIM Take 1 tablet (300 mg total) by mouth daily.   CORICIDIN HBP COLD/FLU PO Take 1 tablet by mouth 2 (two) times daily. As needed   dextromethorphan 15 MG/5ML syrup Take 10 mLs by mouth 4 (four) times daily as needed for cough.   digoxin 0.125 MG tablet Commonly known as: LANOXIN Take 0.5 tablets (0.0625 mg total) by mouth daily.   diltiazem 240 MG 24 hr capsule Commonly  known as: CARDIZEM CD Take 1 capsule (240 mg total) by mouth daily.   donepezil 5 MG tablet Commonly known as: ARICEPT Take 1 tablet (5 mg total) by mouth at bedtime.   Eliquis 5 MG Tabs tablet Generic drug: apixaban TAKE (1) TABLET BY MOUTH TWICE DAILY.   hydrocortisone 2.5 % rectal cream Commonly known as: ANUSOL-HC Place 1 application rectally 2 (two) times daily.   metoprolol succinate 100 MG 24 hr tablet Commonly known as: TOPROL-XL Take 1 tablet (100 mg total) by mouth daily. Take with or immediately following a meal.   metroNIDAZOLE 500 MG tablet Commonly known as: FLAGYL Take 1 tablet (500 mg total) by mouth 3 (three) times daily.        Allergies:  Allergies  Allergen Reactions  . Bee Venom Swelling  . Codeine     REACTION: GI distress    Family History: Family History  Problem Relation Age of Onset  . Heart attack Mother   . Heart attack Father   . Alzheimer's disease Sister     Social History:  reports that she quit smoking about 10 years ago. Her smoking use included cigarettes. She started smoking about 42 years ago. She has a 8.75 pack-year smoking history. She has never used smokeless tobacco. She reports that she does not drink alcohol and does not use drugs.   Physical Exam: BP 105/65   Pulse 60   Ht 5\' 8"  (1.727 m)   Wt 148 lb (67.1 kg)   BMI 22.50 kg/m   Constitutional:  Alert, No acute distress. HEENT: [place] AT, moist mucus membranes.  Trachea midline, no masses. Cardiovascular: No clubbing, cyanosis, or edema. Respiratory: Normal respiratory effort, no increased work of breathing. GI: Abdomen is soft, nontender, nondistended, no abdominal masses Skin: No rashes, bruises or suspicious lesions. Neurologic: Grossly intact, no focal deficits, moving all 4 extremities. Psychiatric: Normal mood and affect.  Laboratory Data:  Urinalysis Dipstick 1+ blood/2+ leukocytes/nitrite positive/2+ protein Microscopy >30 WBC/3-10 RBC/many bacteria   Assessment & Plan:    1.  Recurrent UTI  Urinalysis today with significant pyuria  She does have mild dysuria  Urine culture was ordered and will await results prior to Rx  Will add d-mannose to her prophylactic supplement  Upper tract imaging 12/2019 was negative   01/2020, Cross  [facility] [address], [address] [place], [place] [phone]

## 2020-08-22 ENCOUNTER — Telehealth: Payer: Self-pay

## 2020-08-22 NOTE — Telephone Encounter (Signed)
Pt care taker called looking for urine culture results. sw pt care giver, As per Dr. Lonna Cobb need to wait for Sensitivity. Pt care taker verbalized understanding.

## 2020-08-23 ENCOUNTER — Telehealth: Payer: Self-pay

## 2020-08-23 LAB — CULTURE, URINE COMPREHENSIVE

## 2020-08-23 MED ORDER — D-MANNOSE 500 MG PO CAPS: ORAL_CAPSULE | ORAL | 5 refills | Status: DC

## 2020-08-23 MED ORDER — SULFAMETHOXAZOLE-TRIMETHOPRIM 800-160 MG PO TABS: 1.0000 | ORAL_TABLET | Freq: Two times a day (BID) | ORAL | 0 refills | Status: AC

## 2020-08-23 NOTE — Telephone Encounter (Signed)
Incoming call from pt's caregiver requesting results of urine culture. States that pt is still experiencing dysuria. Please advise.

## 2020-08-23 NOTE — Telephone Encounter (Signed)
Rx was sent to pharmacy.  Patient does not need to have the renal ultrasound done because she had a CT scan August 2021 which was normal.

## 2020-08-24 NOTE — Telephone Encounter (Signed)
Notified patient as instructed, patient pleased °

## 2020-09-02 ENCOUNTER — Telehealth: Payer: Self-pay

## 2020-09-02 ENCOUNTER — Telehealth: Payer: Self-pay | Admitting: Internal Medicine

## 2020-09-02 DIAGNOSIS — I10 Essential (primary) hypertension: Secondary | ICD-10-CM

## 2020-09-02 MED ORDER — DILTIAZEM HCL ER COATED BEADS 240 MG PO CP24: 240.0000 mg | ORAL_CAPSULE | Freq: Every day | ORAL | 3 refills | Status: DC

## 2020-09-02 MED ORDER — DIGOXIN 125 MCG PO TABS
0.0625 mg | ORAL_TABLET | Freq: Every day | ORAL | 1 refills | Status: DC
Start: 2020-09-02 — End: 2021-09-25

## 2020-09-02 MED ORDER — METOPROLOL SUCCINATE ER 100 MG PO TB24: 100.0000 mg | ORAL_TABLET | Freq: Every day | ORAL | 2 refills | Status: DC

## 2020-09-02 NOTE — Telephone Encounter (Signed)
    1. Which medications need to be refilled? (please list name of each medication and dose if known)   DIGOXIN 0.125 MG  DILTIAZEM 240 MG   2. Which pharmacy/location (including street and city if local pharmacy) is medication to be sent to? [facility]   3. Do they need a 30 day or 90 day supply?

## 2020-09-02 NOTE — Telephone Encounter (Signed)
Medication reorder- Metoprolol Succinate 100 mg

## 2020-09-02 NOTE — Telephone Encounter (Signed)
Medication refill request for Digoxin 0.125 mg tablets and Diltalizem 240 mg tablets approved and sent to [facility] pharmacy.

## 2020-11-08 ENCOUNTER — Other Ambulatory Visit: Payer: Self-pay

## 2020-11-08 ENCOUNTER — Ambulatory Visit (INDEPENDENT_AMBULATORY_CARE_PROVIDER_SITE_OTHER): Payer: Medicare Other | Admitting: Internal Medicine

## 2020-11-08 ENCOUNTER — Encounter: Payer: Self-pay | Admitting: Internal Medicine

## 2020-11-08 VITALS — BP 128/66 | HR 60 | Ht 69.5 in | Wt 155.0 lb

## 2020-11-08 DIAGNOSIS — I4891 Unspecified atrial fibrillation: Secondary | ICD-10-CM | POA: Diagnosis not present

## 2020-11-08 NOTE — Progress Notes (Signed)
HPI [name] returns today for followup. She is a pleasant 87 yo woman with a h/o CHB, s/p PPM, 6 years ago. In the interim, she notes no chest pain or sob or edema. She denies falls. She notes that her activity is not quite what it once was. She has developed progressive dementia. He care giver notes that her bp has been running in the 90's. No syncope or falls however.       Allergies   Allergies  Allergen Reactions   Bee Venom Swelling   Codeine     REACTION: GI distress     Current Outpatient Medications  Medication Sig Dispense Refill   allopurinol (ZYLOPRIM) 300 MG tablet Take 1 tablet (300 mg total) by mouth daily. 30 tablet 0   Chlorpheniramine-Acetaminophen (CORICIDIN HBP COLD/FLU PO) Take 1 tablet by mouth 2 (two) times daily. As needed     D-Mannose 500 MG CAPS 2 tabs twice daily 120 capsule 5   dextromethorphan 15 MG/5ML syrup Take 10 mLs by mouth 4 (four) times daily as needed for cough.     digoxin (LANOXIN) 0.125 MG tablet Take 0.5 tablets (0.0625 mg total) by mouth daily. 90 tablet 1   diltiazem (CARDIZEM CD) 240 MG 24 hr capsule Take 1 capsule (240 mg total) by mouth daily. 30 capsule 3   donepezil (ARICEPT) 5 MG tablet Take 1 tablet (5 mg total) by mouth at bedtime. 30 tablet 0   ELIQUIS 5 MG TABS tablet TAKE (1) TABLET BY MOUTH TWICE DAILY. 60 tablet 5   hydrocortisone (ANUSOL-HC) 2.5 % rectal cream Place 1 application rectally 2 (two) times daily. 30 g 0   metoprolol succinate (TOPROL-XL) 100 MG 24 hr tablet Take 1 tablet (100 mg total) by mouth daily. Take with or immediately following a meal. 90 tablet 2   metroNIDAZOLE (FLAGYL) 500 MG tablet Take 1 tablet (500 mg total) by mouth 3 (three) times daily. 21 tablet 0   No current facility-administered medications for this visit.     Past Medical History:  Diagnosis Date   Arthritis    Bilateral chronic knee pain    Brain aneurysm    Brain aneurysm    Brain aneurysm    COPD (chronic obstructive  pulmonary disease) (HCC)    DDD (degenerative disc disease), lumbar    Dementia (HCC)    DJD (degenerative joint disease)    DJD (degenerative joint disease)    DVT (deep venous thrombosis) (HCC)    Gout    Hypertension    Peripheral neuropathy    Permanent atrial fibrillation [facility])    MDT [id number] pacemaker 03-26-2013 by Dr Ladona Ridgel   Pulmonary embolism [facility])    Renal disorder     ROS:   All systems reviewed and negative except as noted in the HPI.   Past Surgical History:  Procedure Laterality Date   CARDIAC CATHETERIZATION  2001   Craniotomy clipping  06-2002   unruptured   Left paraclinoid segment anurysum     [facility]   PACEMAKER INSERTION  09-13-1999; 03-26-2013   PPM gen change by Dr Ladona Ridgel 03-26-2013 - MDT [id number]   PERMANENT PACEMAKER GENERATOR CHANGE N/A 03/26/2013   Procedure: PERMANENT PACEMAKER GENERATOR CHANGE;  Surgeon: Marinus Maw, MD;  Location[facility] C[facility] LAB;  Service: Cardiovascular;  Laterality: N/A;     Family History  Problem Relation Age of Onset   Heart attack Mother    Heart attack Father  Alzheimer's disease Sister      Social History   Socioeconomic History   Marital status: Widowed    Spouse name: Not on file   Number of children: Not on file   Years of education: Not on file   Highest education level: Not on file  Occupational History   Occupation: retired from the [employer] company  Tobacco Use   Smoking status: Former    Packs/day: 0.25    Years: 35.00    Pack years: 8.75    Types: Cigarettes    Start date: 12/27/1977    Quit date: 05/26/2010    Years since quitting: 10.4   Smokeless tobacco: Never  Vaping Use   Vaping Use: Not on file  Substance and Sexual Activity   Alcohol use: No    Alcohol/week: 0.0 standard drinks   Drug use: No   Sexual activity: Not on file  Other Topics Concern   Not on file  Social History Narrative   Not on file   Social Determinants of Health   Financial Resource  Strain: Not on file  Food Insecurity: Not on file  Transportation Needs: Not on file  Physical Activity: Not on file  Stress: Not on file  Social Connections: Not on file  Intimate Partner Violence: Not on file     BP 128/66   Pulse 60   Ht 5' 9.5" (1.765 m)   Wt 155 lb (70.3 kg)   SpO2 96%   BMI 22.56 kg/m   Physical Exam:  Pleasant but elderly and frail appearing NAD HEENT: Unremarkable Neck:  No JVD, no thyromegally Lymphatics:  No adenopathy Back:  No CVA tenderness Lungs:  Clear with no wheezes HEART:  Regular rate rhythm, no murmurs, no rubs, no clicks Abd:  soft, positive bowel sounds, no organomegally, no rebound, no guarding Ext:  2 plus pulses, no edema, no cyanosis, no clubbing Skin:  No rashes no nodules Neuro:  CN II through XII intact, motor grossly intact  EKG - atrial fib with ventricular pacing  DEVICE  Normal device function.  See PaceArt for details.   Assess/Plan:  Low bp - she has a h/o HTN but her pressures at home have been in the low 90's. I have asked the patient wean her cardizem to off. Atrial fib - her VR is well controlled. Hopefully her rates wont increase with cessation of cardizem.  Coags - she has not had any bleeding on eliquis. PPM - her medtronic single chamber PPM is working normally. She has about 18 months of batery longevity.   [name] Tracy Kalb,MD

## 2020-11-08 NOTE — Patient Instructions (Signed)
Medication Instructions:  Your physician recommends that you continue on your current medications as directed. Please refer to the Current Medication list given to you today.  Take Diltiazem 240 mg Every other day for 2 Weeks then STOP Taking. (11/23/20)    *If you need a refill on your cardiac medications before your next appointment, please call your pharmacy*   Lab Work: NONE   If you have labs (blood work) drawn today and your tests are completely normal, you will receive your results only by: MyChart Message (if you have MyChart) OR A paper copy in the mail If you have any lab test that is abnormal or we need to change your treatment, we will call you to review the results.   Testing/Procedures: NONE   Follow-Up: At [facility], you and your health needs are our priority.  As part of our continuing mission to provide you with exceptional heart care, we have created designated Provider Care Teams.  These Care Teams include your primary Cardiologist (physician) and Advanced Practice Providers (APPs -  Physician Assistants and Nurse Practitioners) who all work together to provide you with the care you need, when you need it.  We recommend signing up for the patient portal called "MyChart".  Sign up information is provided on this After Visit Summary.  MyChart is used to connect with patients for Virtual Visits (Telemedicine).  Patients are able to view lab/test results, encounter notes, upcoming appointments, etc.  Non-urgent messages can be sent to your provider as well.   To learn more about what you can do with MyChart, go to [email or web address].    Your next appointment:   1 year(s)  The format for your next appointment:   In Person  Provider:   Lewayne Bunting, MD   Other Instructions Thank you for choosing [facility]!

## 2020-11-16 ENCOUNTER — Other Ambulatory Visit: Payer: Self-pay | Admitting: Internal Medicine

## 2020-11-16 DIAGNOSIS — I4891 Unspecified atrial fibrillation: Secondary | ICD-10-CM

## 2020-11-16 NOTE — Telephone Encounter (Signed)
Prescription refill request for Eliquis received. Indication: Atrial Fib Last office visit: 11/08/20  Rosette Reveal MD Scr: 0.89 Age: 87 Weight: 70.3kg  Based on above findings Eliquis 5mg  twice daily is the appropriate dose.  Refill approved.

## 2021-01-25 ENCOUNTER — Telehealth: Payer: Self-pay

## 2021-01-25 DIAGNOSIS — N390 Urinary tract infection, site not specified: Secondary | ICD-10-CM

## 2021-01-25 MED ORDER — D-MANNOSE 500 MG PO CAPS: ORAL_CAPSULE | ORAL | 5 refills | Status: AC

## 2021-01-25 NOTE — Telephone Encounter (Signed)
Incoming VM on triage line in regards to patients D-mannose Rx. Caretaker states refill request have been sent over with no response. Refilled D-mannose. Notified caretaker.

## 2021-04-17 ENCOUNTER — Other Ambulatory Visit: Payer: Self-pay | Admitting: Internal Medicine

## 2021-04-17 DIAGNOSIS — I10 Essential (primary) hypertension: Secondary | ICD-10-CM

## 2021-05-16 ENCOUNTER — Other Ambulatory Visit: Payer: Self-pay | Admitting: Internal Medicine

## 2021-05-16 DIAGNOSIS — I4891 Unspecified atrial fibrillation: Secondary | ICD-10-CM

## 2021-05-16 NOTE — Telephone Encounter (Signed)
Prescription refill request for Eliquis received. Indication: Atrial Fib Last office visit: 11/08/20 g Ladona Ridgel MD Scr:  0.89 on 01/05/20 Age: 88 Weight: 70.3kg  Based on above findings Eliquis 5mg  twice daily is the appropriate dose.  Patient is past due for CBC/BMP to verify correct Eliquis dose.  Message sent to Nurse to schedule labs ASAP.  Refill approved x 1.

## 2021-07-17 ENCOUNTER — Other Ambulatory Visit: Payer: Self-pay | Admitting: Internal Medicine

## 2021-07-17 DIAGNOSIS — I10 Essential (primary) hypertension: Secondary | ICD-10-CM

## 2021-07-17 DIAGNOSIS — I4891 Unspecified atrial fibrillation: Secondary | ICD-10-CM

## 2021-07-28 ENCOUNTER — Emergency Department (HOSPITAL_COMMUNITY): Payer: Medicare Other

## 2021-07-28 ENCOUNTER — Emergency Department (HOSPITAL_COMMUNITY)
Admission: EM | Admit: 2021-07-28 | Discharge: 2021-07-29 | Disposition: A | Discharge status: Skilled Nursing Facility | Payer: Medicare Other | Attending: Emergency Medicine | Admitting: Emergency Medicine

## 2021-07-28 ENCOUNTER — Other Ambulatory Visit: Payer: Self-pay

## 2021-07-28 ENCOUNTER — Encounter (HOSPITAL_COMMUNITY): Payer: Self-pay | Admitting: *Deleted

## 2021-07-28 DIAGNOSIS — W1830XA Fall on same level, unspecified, initial encounter: Secondary | ICD-10-CM | POA: Insufficient documentation

## 2021-07-28 DIAGNOSIS — M25552 Pain in left hip: Secondary | ICD-10-CM | POA: Insufficient documentation

## 2021-07-28 DIAGNOSIS — R410 Disorientation, unspecified: Secondary | ICD-10-CM | POA: Insufficient documentation

## 2021-07-28 DIAGNOSIS — W19XXXA Unspecified fall, initial encounter: Secondary | ICD-10-CM

## 2021-07-28 LAB — URINALYSIS, ROUTINE W REFLEX MICROSCOPIC
Bilirubin Urine: NEGATIVE
Glucose, UA: NEGATIVE mg/dL
Hgb urine dipstick: NEGATIVE
Ketones, ur: 5 mg/dL — AB
Nitrite: POSITIVE — AB
Protein, ur: NEGATIVE mg/dL
Specific Gravity, Urine: 1.02 (ref 1.005–1.030)
WBC, UA: >50 WBC/hpf — ABNORMAL HIGH (ref 0–5)
pH: 5 (ref 5.0–8.0)

## 2021-07-28 NOTE — ED Notes (Signed)
Patient transported to X-ray 

## 2021-07-28 NOTE — ED Provider Notes (Signed)
?[facility] ?Provider Note ? ? ?CSN: [id number] ?Arrival date & time: 07/28/21  1811 ? ?  ? ?History ? ?Chief Complaint  ?Patient presents with  ? Fall  ? ? ?[name] is a 88 y.o. female. ? ?HPI ?Patient patient sent here from her facility where she apparently fell.  She is confused and unable to give history.  She presents from her facility, transferred by EMS. ?  ? ?Home Medications ?Prior to Admission medications   ?Medication Sig Start Date End Date Taking? Authorizing Provider  ?allopurinol (ZYLOPRIM) 300 MG tablet Take 1 tablet (300 mg total) by mouth daily. 07/29/19   Rennis Harding, PA-C  ?Chlorpheniramine-Acetaminophen (CORICIDIN HBP COLD/FLU PO) Take 1 tablet by mouth 2 (two) times daily. As needed    [provider]  ?D-Mannose 500 MG CAPS 2 tabs twice daily 01/25/21   Stoioff, Verna Czech, MD  ?dextromethorphan 15 MG/5ML syrup Take 10 mLs by mouth 4 (four) times daily as needed for cough.    [provider]  ?digoxin (LANOXIN) 0.125 MG tablet Take 0.5 tablets (0.0625 mg total) by mouth daily. 09/02/20   Marinus Maw, MD  ?diltiazem (CARDIZEM CD) 240 MG 24 hr capsule Take 1 capsule (240 mg total) by mouth daily. 09/02/20   Marinus Maw, MD  ?donepezil (ARICEPT) 5 MG tablet Take 1 tablet (5 mg total) by mouth at bedtime. 07/29/19   Wurst, [place], PA-C  ?ELIQUIS 5 MG TABS tablet TAKE (1) TABLET BY MOUTH TWICE DAILY. 07/19/21   Marinus Maw, MD  ?hydrocortisone (ANUSOL-HC) 2.5 % rectal cream Place 1 application rectally 2 (two) times daily. 12/27/19   Lorelee New, PA-C  ?metoprolol succinate (TOPROL-XL) 100 MG 24 hr tablet TAKE (1) TABLET BY MOUTH ONCE DAILY. TAKE WITH A MEAL. 07/19/21   Marinus Maw, MD  ?metroNIDAZOLE (FLAGYL) 500 MG tablet Take 1 tablet (500 mg total) by mouth 3 (three) times daily. 01/05/20   Geoffery Lyons, MD  ?   ? ?Allergies    ?Bee venom and Codeine   ? ?Review of Systems   ?Review of Systems ? ?Physical Exam ?Updated Vital  Signs ?BP 139/68   Pulse 63   Temp 97.8 ?F (36.6 ?C) (Oral)   Resp 19   SpO2 99%  ?Physical Exam ?Vitals and nursing note reviewed.  ?Constitutional:   ?   General: She is not in acute distress. ?   Appearance: She is well-developed. She is not ill-appearing, toxic-appearing or diaphoretic.  ?HENT:  ?   Head: Normocephalic and atraumatic.  ?   Comments: No visible injury to scalp or head. ?   Right Ear: External ear normal.  ?   Left Ear: External ear normal.  ?   Nose: No congestion.  ?Eyes:  ?   Conjunctiva/sclera: Conjunctivae normal.  ?   Pupils: Pupils are equal, round, and reactive to light.  ?Neck:  ?   Trachea: Phonation normal.  ?   Comments: Wearing cervical collar applied by EMS. ?Cardiovascular:  ?   Rate and Rhythm: Normal rate and regular rhythm.  ?   Heart sounds: Normal heart sounds.  ?Pulmonary:  ?   Effort: Pulmonary effort is normal. No respiratory distress.  ?   Breath sounds: Normal breath sounds. No stridor.  ?Abdominal:  ?   General: There is no distension.  ?   Palpations: Abdomen is soft.  ?   Tenderness: There is no abdominal tenderness.  ?Musculoskeletal:     ?  General: No swelling, tenderness, deformity or signs of injury. Normal range of motion.  ?Skin: ?   General: Skin is warm and dry.  ?Neurological:  ?   Mental Status: She is alert.  ?   Cranial Nerves: No cranial nerve deficit.  ?   Motor: No abnormal muscle tone.  ?   Coordination: Coordination normal.  ?Psychiatric:     ?   Mood and Affect: Mood normal.     ?   Behavior: Behavior normal.  ? ? ?ED Results / Procedures / Treatments   ?Labs ?(all labs ordered are listed, but only abnormal results are displayed) ?Labs Reviewed  ?URINALYSIS, ROUTINE W REFLEX MICROSCOPIC - Abnormal; Notable for the following components:  ?    Result Value  ? Color, Urine AMBER (*)   ? APPearance HAZY (*)   ? Ketones, ur 5 (*)   ? Nitrite POSITIVE (*)   ? Leukocytes,Ua MODERATE (*)   ? WBC, UA >50 (*)   ? Bacteria, UA MANY (*)   ? All other  components within normal limits  ? ? ?EKG ?None ? ?Radiology ?DG Chest 1 View ? ?Result Date: 07/28/2021 ?CLINICAL DATA:  Trauma, fall EXAM: CHEST  1 VIEW COMPARISON:  12/09/2019 FINDINGS: Transverse diameter of heart is increased. Lung fields are clear of any infiltrates or pulmonary edema. There is no pleural effusion or pneumothorax. Pacemaker battery is seen in the right infraclavicular region with tips of leads in right atrium and right ventricle. Degenerative changes are noted in both shoulders. IMPRESSION: Cardiomegaly. There are no signs of pulmonary edema or focal pulmonary consolidation. Electronically Signed   By: Ernie Avena M.D.   On: 07/28/2021 19:13  ? ?CT Head Wo Contrast ? ?Result Date: 07/28/2021 ?CLINICAL DATA:  Trauma EXAM: CT HEAD WITHOUT CONTRAST TECHNIQUE: Contiguous axial images were obtained from the base of the skull through the vertex without intravenous contrast. RADIATION DOSE REDUCTION: This exam was performed according to the departmental dose-optimization program which includes automated exposure control, adjustment of the mA and/or kV according to patient size and/or use of iterative reconstruction technique. COMPARISON:  04/14/2020 FINDINGS: Brain: No acute intracranial findings are seen. Ventricles are unremarkable. Cortical sulci are prominent. There is encephalomalacia in the left temporal lobe. There is old infarct in the left frontal cortex. There is previous placement of aneurysm clip in the left suprasellar region. Severe beam hardening artifacts caused by the aneurysm clip limit evaluation of adjacent structures. Vascular: There are scattered arterial calcifications. Skull: There is previous left frontotemporal craniotomy. Sinuses/Orbits: There is mucosal thickening in the ethmoid and maxillary sinuses. Other: None IMPRESSION: No acute intracranial findings are seen. Atrophy. 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 There is previous left  temporal craniotomy, possibly for placement of aneurysm clip in the left supraclinoid region. Aneurysm clip is producing significant beam hardening artifacts limiting evaluation of adjacent structures. Chronic sinusitis. Electronically Signed   By: Ernie Avena M.D.   On: 07/28/2021 19:12  ? ?CT Cervical Spine Wo Contrast ? ?Result Date: 07/28/2021 ?CLINICAL DATA:  Trauma, fall EXAM: CT CERVICAL SPINE WITHOUT CONTRAST TECHNIQUE: Multidetector CT imaging of the cervical spine was performed without intravenous contrast. Multiplanar CT image reconstructions were also generated. RADIATION DOSE REDUCTION: This exam was performed according to the departmental dose-optimization program which includes automated exposure control, adjustment of the mA and/or kV according to patient size and/or use of iterative reconstruction technique. COMPARISON:  None FINDINGS: Alignment: Alignment of posterior margins  of vertebral bodies is unremarkable. Skull base and vertebrae: No recent fracture is seen. Small smooth marginated calcifications posterior to the spinous processes of C6 and C7 vertebrae may be residual from previous injury. Soft tissues and spinal canal: There is extrinsic pressure over the ventral margin of thecal sac at multiple levels more so at C3-C4 level with mild spinal stenosis Disc levels: There is encroachment of neural foramina by uncovertebral spurs and facet hypertrophy from C3 to C6 levels. Upper chest: Unremarkable. Other: Thyroid is enlarged in size with inhomogeneous attenuation. Thyroid is not included in its entirety. IMPRESSION: No recent fracture is seen in the cervical spine. Cervical spondylosis with encroachment of neural foramina from C3-C6 levels. There is marked enlargement of thyroid with inhomogeneous attenuation. Follow-up thyroid sonogram may be considered. Electronically Signed   By: Ernie AvenaPalani  Rathinasamy M.D.   On: 07/28/2021 19:17  ? ?DG Pelvis Portable ? ?Result Date:  07/28/2021 ?CLINICAL DATA:  Fall with left-sided hip pain EXAM: PORTABLE PELVIS 1-2 VIEWS COMPARISON:  03/17/2020 FINDINGS: SI joints are non widened. Pubic symphysis and rami are intact. No definitive fracture or malalignment is seen. S

## 2021-07-28 NOTE — ED Notes (Signed)
C-collar applied on pt ?

## 2021-07-28 NOTE — ED Triage Notes (Addendum)
EMS reports pt fell outside on grass.  ?Skin tear noted to right elbow, c/o left hip pain after assessing pt. Pt states she hit her right side of her head.  ?

## 2021-07-28 NOTE — Discharge Instructions (Signed)
There were no injuries or infections found after the fall.  Use Tylenol if needed for pain.  See your doctor if needed for problems. ?

## 2021-09-18 ENCOUNTER — Other Ambulatory Visit: Payer: Self-pay | Admitting: Internal Medicine

## 2021-09-18 DIAGNOSIS — I4891 Unspecified atrial fibrillation: Secondary | ICD-10-CM

## 2021-09-18 NOTE — Telephone Encounter (Signed)
Prescription refill request for Eliquis received. ?Indication: Atrial Fib ?Last office visit: 11/08/20  Rosette Reveal MD ?Scr: 0.89 on 01/05/20 ?Age: 88 ?Weight: 70.3kg ? ?Based on above findings Eliquis 5mg  twice daily is the appropriate dose.  Pt is PAST DUE for blood work.  Will send message to [facility] for nurse to schedule blood work.  Refill approved x 1 only. ? ?

## 2021-09-22 ENCOUNTER — Other Ambulatory Visit: Payer: Self-pay | Admitting: Internal Medicine

## 2021-10-12 ENCOUNTER — Emergency Department (HOSPITAL_COMMUNITY)
Admission: EM | Admit: 2021-10-12 | Discharge: 2021-10-12 | Disposition: A | Discharge status: Home / Self Care | Payer: Medicare Other | Attending: Emergency Medicine | Admitting: Emergency Medicine

## 2021-10-12 ENCOUNTER — Emergency Department (HOSPITAL_COMMUNITY): Payer: Medicare Other

## 2021-10-12 ENCOUNTER — Other Ambulatory Visit: Payer: Self-pay

## 2021-10-12 ENCOUNTER — Encounter (HOSPITAL_COMMUNITY): Payer: Self-pay | Admitting: Emergency Medicine

## 2021-10-12 DIAGNOSIS — F0390 Unspecified dementia without behavioral disturbance: Secondary | ICD-10-CM | POA: Diagnosis not present

## 2021-10-12 DIAGNOSIS — N390 Urinary tract infection, site not specified: Secondary | ICD-10-CM

## 2021-10-12 DIAGNOSIS — I4891 Unspecified atrial fibrillation: Secondary | ICD-10-CM | POA: Diagnosis not present

## 2021-10-12 DIAGNOSIS — Z7901 Long term (current) use of anticoagulants: Secondary | ICD-10-CM | POA: Diagnosis not present

## 2021-10-12 DIAGNOSIS — R1030 Lower abdominal pain, unspecified: Secondary | ICD-10-CM | POA: Diagnosis present

## 2021-10-12 LAB — URINALYSIS, ROUTINE W REFLEX MICROSCOPIC
Bilirubin Urine: NEGATIVE
Glucose, UA: NEGATIVE mg/dL
Hgb urine dipstick: NEGATIVE
Ketones, ur: NEGATIVE mg/dL
Nitrite: POSITIVE — AB
Protein, ur: 30 mg/dL — AB
Specific Gravity, Urine: 1.023 (ref 1.005–1.030)
WBC, UA: >50 WBC/hpf — ABNORMAL HIGH (ref 0–5)
pH: 5 (ref 5.0–8.0)

## 2021-10-12 LAB — CBC
HCT: 40.6 % (ref 36.0–46.0)
Hemoglobin: 13 g/dL (ref 12.0–15.0)
MCH: 33.7 pg (ref 26.0–34.0)
MCHC: 32 g/dL (ref 30.0–36.0)
MCV: 105.2 fL — ABNORMAL HIGH (ref 80.0–100.0)
Platelets: 141 10*3/uL — ABNORMAL LOW (ref 150–400)
RBC: 3.86 MIL/uL — ABNORMAL LOW (ref 3.87–5.11)
RDW: 14 % (ref 11.5–15.5)
WBC: 7.5 10*3/uL (ref 4.0–10.5)
nRBC: 0 % (ref 0.0–0.2)

## 2021-10-12 LAB — COMPREHENSIVE METABOLIC PANEL
ALT: 21 U/L (ref 0–44)
AST: 19 U/L (ref 15–41)
Albumin: 3.4 g/dL — ABNORMAL LOW (ref 3.5–5.0)
Alkaline Phosphatase: 62 U/L (ref 38–126)
Anion gap: 11 (ref 5–15)
BUN: 15 mg/dL (ref 8–23)
CO2: 25 mmol/L (ref 22–32)
Calcium: 8.7 mg/dL — ABNORMAL LOW (ref 8.9–10.3)
Chloride: 104 mmol/L (ref 98–111)
Creatinine, Ser: 0.83 mg/dL (ref 0.44–1.00)
GFR, Estimated: >60 mL/min (ref >60)
Glucose, Bld: 100 mg/dL — ABNORMAL HIGH (ref 70–99)
Potassium: 4 mmol/L (ref 3.5–5.1)
Sodium: 140 mmol/L (ref 135–145)
Total Bilirubin: 1 mg/dL (ref 0.3–1.2)
Total Protein: 6.5 g/dL (ref 6.5–8.1)

## 2021-10-12 LAB — DIGOXIN LEVEL: Digoxin Level: 0.8 ng/mL (ref 0.8–2.0)

## 2021-10-12 LAB — LIPASE, BLOOD: Lipase: 26 U/L (ref 11–51)

## 2021-10-12 MED ORDER — IOHEXOL 300 MG/ML  SOLN
100.0000 mL | Freq: Once | INTRAMUSCULAR | Status: AC | PRN
  Administered 2021-10-12: 100 mL via INTRAVENOUS

## 2021-10-12 MED ORDER — CEPHALEXIN 500 MG PO CAPS: 500.0000 mg | ORAL_CAPSULE | Freq: Four times a day (QID) | ORAL | 0 refills | Status: DC

## 2021-10-12 NOTE — ED Notes (Signed)
Lab at bedside

## 2021-10-12 NOTE — ED Provider Notes (Signed)
[facility] Provider Note   CSN: [id number] Arrival date & time: 10/12/21  0945     History  Chief Complaint  Patient presents with   Abdominal Pain    [name] is a 88 y.o. female.   Abdominal Pain Patient with history of dementia.  Reportedly sent in from nursing home with abdominal pain.  Questionably started last night.  Unknown if diarrhea.  States the pain is in the lower abdomen.  Unknown if dysuria.  History of atrial fibrillation.     Home Medications Prior to Admission medications   Medication Sig Start Date End Date Taking? Authorizing Provider  albuterol (VENTOLIN HFA) 108 (90 Base) MCG/ACT inhaler Inhale 2 puffs into the lungs every 6 (six) hours as needed for wheezing or shortness of breath.   Yes [provider]  allopurinol (ZYLOPRIM) 300 MG tablet Take 1 tablet (300 mg total) by mouth daily. 07/29/19  Yes Wurst, [place], PA-C  azithromycin (ZITHROMAX) 250 MG tablet Take 250 mg by mouth daily.   Yes [provider]  benzonatate (TESSALON) 100 MG capsule Take 100 mg by mouth daily as needed for cough.   Yes [provider]  cephALEXin (KEFLEX) 500 MG capsule Take 1 capsule (500 mg total) by mouth 4 (four) times daily. 10/12/21  Yes Benjiman CorePickering, Lyriq Jarchow, MD  Chlorpheniramine-Acetaminophen (CORICIDIN HBP COLD/FLU PO) Take 1 tablet by mouth 2 (two) times daily. As needed for congestion   Yes [provider]  D-Mannose 500 MG CAPS 2 tabs twice daily Patient taking differently: Take 2 capsules by mouth 2 (two) times daily. 01/25/21  Yes Stoioff, Verna CzechScott C, MD  digoxin (LANOXIN) 0.125 MG tablet TAKE (1/2) TABLET BY MOUTH ONCE DAILY. 09/25/21  Yes Marinus Mawaylor, Gregg W, MD  donepezil (ARICEPT) 5 MG tablet Take 1 tablet (5 mg total) by mouth at bedtime. Patient taking differently: Take 10 mg by mouth at bedtime. 07/29/19  Yes Wurst, [place], PA-C  ELIQUIS 5 MG TABS tablet TAKE (1) TABLET BY MOUTH TWICE DAILY. 09/18/21  Yes  Marinus Mawaylor, Gregg W, MD  fluticasone (FLONASE) 50 MCG/ACT nasal spray Place 1 spray into both nostrils daily.   Yes [provider]  metoprolol succinate (TOPROL-XL) 100 MG 24 hr tablet TAKE (1) TABLET BY MOUTH ONCE DAILY. TAKE WITH A MEAL. 07/19/21  Yes Marinus Mawaylor, Gregg W, MD  vitamin C (ASCORBIC ACID) 500 MG tablet Take 500 mg by mouth daily.   Yes [provider]  diltiazem (CARDIZEM CD) 240 MG 24 hr capsule Take 1 capsule (240 mg total) by mouth daily. Patient not taking: Reported on 10/12/2021 09/02/20   Marinus Mawaylor, Gregg W, MD  hydrocortisone (ANUSOL-HC) 2.5 % rectal cream Place 1 application rectally 2 (two) times daily. Patient not taking: Reported on 10/12/2021 12/27/19   Lorelee NewGreen, Garrett L, PA-C      Allergies    Bee venom and Codeine    Review of Systems   Review of Systems  Gastrointestinal:  Positive for abdominal pain.    Physical Exam Updated Vital Signs BP 105/90   Pulse 62   Temp 97.7 F (36.5 C) (Oral)   Resp 17   Ht 5' 9.5" (1.765 m)   Wt 70.3 kg   SpO2 98%   BMI 22.56 kg/m  Physical Exam Vitals and nursing note reviewed.  HENT:     Head: Normocephalic.  Cardiovascular:     Rate and Rhythm: Regular rhythm.  Abdominal:     Comments: Lower abdominal tenderness without rebound or guarding.  No hernia palpated.  Neurological:     Mental Status: She is alert.     Comments: Patient is at her reported baseline.     ED Results / Procedures / Treatments   Labs (all labs ordered are listed, but only abnormal results are displayed) Labs Reviewed  COMPREHENSIVE METABOLIC PANEL - Abnormal; Notable for the following components:      Result Value   Glucose, Bld 100 (*)    Calcium 8.7 (*)    Albumin 3.4 (*)    All other components within normal limits  CBC - Abnormal; Notable for the following components:   RBC 3.86 (*)    MCV 105.2 (*)    Platelets 141 (*)    All other components within normal limits  URINALYSIS, ROUTINE W REFLEX MICROSCOPIC - Abnormal;  Notable for the following components:   Color, Urine AMBER (*)    APPearance CLOUDY (*)    Protein, ur 30 (*)    Nitrite POSITIVE (*)    Leukocytes,Ua LARGE (*)    WBC, UA >50 (*)    Bacteria, UA MANY (*)    All other components within normal limits  URINE CULTURE  LIPASE, BLOOD  DIGOXIN LEVEL    EKG None  Radiology CT ABDOMEN PELVIS W CONTRAST  Result Date: 10/12/2021 CLINICAL DATA:  Left lower quadrant abdominal pain. EXAM: CT ABDOMEN AND PELVIS WITH CONTRAST TECHNIQUE: Multidetector CT imaging of the abdomen and pelvis was performed using the standard protocol following bolus administration of intravenous contrast. RADIATION DOSE REDUCTION: This exam was performed according to the departmental dose-optimization program which includes automated exposure control, adjustment of the mA and/or kV according to patient size and/or use of iterative reconstruction technique. CONTRAST:  OMNIPAQUE IOHEXOL 300 MG/ML  SOLN COMPARISON:  01/05/2020 FINDINGS: Lower chest: Atelectasis noted posterior right lower lobe. Hepatobiliary: No suspicious focal abnormality within the liver parenchyma. Gallbladder surgically absent. Pneumobilia suggests prior sphincterotomy. Pancreas: No focal mass lesion. No dilatation of the main duct. No intraparenchymal cyst. No peripancreatic edema. Spleen: No splenomegaly. No focal mass lesion. Adrenals/Urinary Tract: No adrenal nodule or mass. Right kidney unremarkable. Left pelvic kidney again noted with cortical scarring. Bladder is nondistended although there does appear to be circumferential mild irregular bladder wall thickening. Stomach/Bowel: Stomach is unremarkable. No gastric wall thickening. No evidence of outlet obstruction. Duodenum is normally positioned as is the ligament of Treitz. No small bowel wall thickening. No small bowel dilatation. The terminal ileum is normal. The appendix is not well visualized, but there is no edema or inflammation in the region of  the cecum. No gross colonic mass. No colonic wall thickening. Diverticuli are seen scattered along the entire length of the colon without CT findings of diverticulitis. Vascular/Lymphatic: There is moderate atherosclerotic calcification of the abdominal aorta without aneurysm. There is no gastrohepatic or hepatoduodenal ligament lymphadenopathy. No retroperitoneal or mesenteric lymphadenopathy. No pelvic sidewall lymphadenopathy. Reproductive: Unremarkable. Other: No intraperitoneal free fluid. Musculoskeletal: Degenerative changes noted in both hips. No worrisome lytic or sclerotic osseous abnormality. IMPRESSION: 1. Mild irregular circumferential bladder wall thickening. This is likely accentuated by underdistention, but correlation for infection/inflammation recommended. 2. Diffuse colonic diverticulosis without diverticulitis. 3. Left pelvic kidney with cortical scarring. 4. Aortic Atherosclerosis (ICD10-I70.0). Electronically Signed   By: Kennith Center M.D.   On: 10/12/2021 14:01    Procedures Procedures    Medications Ordered in ED Medications  iohexol (OMNIPAQUE) 300 MG/ML solution 100 mL (100 mLs Intravenous Contrast Given 10/12/21 1315)  ED Course/ Medical Decision Making/ A&P                           Medical Decision Making Amount and/or Complexity of Data Reviewed Labs: ordered. Radiology: ordered.  Risk Prescription drug management.   Patient abdominal pain.  Dementia and cannot provide much history.  Differential diagnosis is long includes diverticulitis, which she has had before, urinary tract infection, musculoskeletal pain.  Lab work reviewed and reassuring.  White count reassuring.  However urine shows likely infection.  Culture has been reviewed from previous culture.  Showed E. coli at that time.  Will treat with Keflex.  Appears stable for discharge home.  Discussed with patient and her brother-in-law.        Final Clinical Impression(s) / ED Diagnoses Final  diagnoses:  Lower urinary tract infectious disease    Rx / DC Orders ED Discharge Orders          Ordered    cephALEXin (KEFLEX) 500 MG capsule  4 times daily        10/12/21 1449              Benjiman Core, MD 10/12/21 1513

## 2021-10-12 NOTE — Discharge Instructions (Addendum)
It appears you have a urinary tract infection.  A urine culture has been sent and you will be notified if it does not match up with the antibiotic you are given.

## 2021-10-12 NOTE — ED Triage Notes (Signed)
Pt to the ED via [facility] from [facility] with complaints of abdominal pain that began last night.  Pt is a poor historian and has a history of dementia. Denies N/V but is not sure is she has had diarrhea.

## 2021-10-12 NOTE — ED Notes (Signed)
Patient transported to CT 

## 2021-10-14 LAB — URINE CULTURE: Culture: >=100000 — AB

## 2021-10-15 ENCOUNTER — Telehealth (HOSPITAL_BASED_OUTPATIENT_CLINIC_OR_DEPARTMENT_OTHER): Payer: Self-pay | Admitting: *Deleted

## 2021-10-15 NOTE — Telephone Encounter (Signed)
Post ED Visit - Positive Culture Follow-up  Culture report reviewed by antimicrobial stewardship pharmacist: [facility] []  , Pharm.D. []  Enzo Bi, Pharm.D., BCPS AQ-ID []  , Pharm.D., BCPS []  Celedonio Miyamoto, Pharm.D., BCPS []  [place], Garvin Fila.D., BCPS, AAHIVP []  , Pharm.D., BCPS, AAHIVP []  Georgina Pillion, PharmD, BCPS []  , PharmD, BCPS []  [place], PharmD, BCPS []  [place], PharmD []  , PharmD, BCPS [x]  Estella Husk, PharmD  Pharmacy Team []  Lysle Pearl, PharmD []  , PharmD []  Phillips Climes, PharmD []  , Rph []  Agapito Games) , PharmD []  Verlan Friends, PharmD []  , PharmD []  Mervyn Gay, PharmD []  , PharmD []  Daylene Posey, PharmD []  Wonda Olds, PharmD []  , PharmD []  Len Childs, PharmD   Positive urine culture Treated with Cephalexin, organism sensitive to the same and no further patient follow-up is required at this time.  10/15/2021, 10:28 AM

## 2021-11-10 ENCOUNTER — Other Ambulatory Visit: Payer: Self-pay | Admitting: Internal Medicine

## 2021-11-10 DIAGNOSIS — I4891 Unspecified atrial fibrillation: Secondary | ICD-10-CM

## 2021-11-10 NOTE — Telephone Encounter (Signed)
Prescription refill request for Eliquis received. Indication: Atrial Fib Last office visit: 11/08/20  Rosette Reveal MD Scr: 0.83 on 10/12/21 Age: 88 Weight: 70.3kg   Based on above findings Eliquis 5mg  twice daily is the appropriate dose.  Pt is PAST DUE for appt with Dr .  Will send message to [facility] scheduler to make appt.  Refill approved x 1 only.

## 2021-12-19 ENCOUNTER — Other Ambulatory Visit: Payer: Self-pay | Admitting: Internal Medicine

## 2021-12-19 DIAGNOSIS — I4891 Unspecified atrial fibrillation: Secondary | ICD-10-CM

## 2021-12-28 ENCOUNTER — Encounter: Payer: Self-pay | Admitting: Internal Medicine

## 2021-12-28 ENCOUNTER — Ambulatory Visit (INDEPENDENT_AMBULATORY_CARE_PROVIDER_SITE_OTHER): Payer: Medicare Other | Admitting: Internal Medicine

## 2021-12-28 VITALS — BP 140/70 | HR 60 | Ht 69.0 in | Wt 153.2 lb

## 2021-12-28 DIAGNOSIS — I4891 Unspecified atrial fibrillation: Secondary | ICD-10-CM

## 2021-12-28 DIAGNOSIS — I495 Sick sinus syndrome: Secondary | ICD-10-CM | POA: Diagnosis not present

## 2021-12-28 LAB — CUP PACEART INCLINIC DEVICE CHECK
Battery Impedance: 5376 Ohm
Battery Remaining Longevity: 7 mo
Battery Voltage: 2.68 V
Brady Statistic RV Percent Paced: 77 %
Date Time Interrogation Session: 20230824092055
Implantable Lead Implant Date: 20010509
Implantable Lead Location: 753860
Implantable Lead Model: 5076
Implantable Pulse Generator Implant Date: 20141120
Lead Channel Impedance Value: 0 Ohm
Lead Channel Impedance Value: 451 Ohm
Lead Channel Pacing Threshold Amplitude: 0.625 V
Lead Channel Pacing Threshold Pulse Width: 0.4 ms
Lead Channel Sensing Intrinsic Amplitude: 8 mV
Lead Channel Setting Pacing Amplitude: 2.5 V
Lead Channel Setting Pacing Pulse Width: 0.4 ms
Lead Channel Setting Sensing Sensitivity: 2 mV

## 2021-12-28 NOTE — Patient Instructions (Signed)
Medication Instructions:  Your physician recommends that you continue on your current medications as directed. Please refer to the Current Medication list given to you today.  *If you need a refill on your cardiac medications before your next appointment, please call your pharmacy*   Lab Work: NONE   If you have labs (blood work) drawn today and your tests are completely normal, you will receive your results only by: MyChart Message (if you have MyChart) OR A paper copy in the mail If you have any lab test that is abnormal or we need to change your treatment, we will call you to review the results.   Testing/Procedures: NONE    Follow-Up: At [facility], you and your health needs are our priority.  As part of our continuing mission to provide you with exceptional heart care, we have created designated Provider Care Teams.  These Care Teams include your primary Cardiologist (physician) and Advanced Practice Providers (APPs -  Physician Assistants and Nurse Practitioners) who all work together to provide you with the care you need, when you need it.  We recommend signing up for the patient portal called "MyChart".  Sign up information is provided on this After Visit Summary.  MyChart is used to connect with patients for Virtual Visits (Telemedicine).  Patients are able to view lab/test results, encounter notes, upcoming appointments, etc.  Non-urgent messages can be sent to your provider as well.   To learn more about what you can do with MyChart, go to [email or web address].    Your next appointment:   1 year(s)  The format for your next appointment:   In Person  Provider:   Lewayne Bunting, MD    Other Instructions  Your physician recommends that you schedule a follow-up appointment in: 6 Months with Device [facility]    Thank you for choosing [facility]!    Important Information About Sugar

## 2021-12-28 NOTE — Progress Notes (Signed)
HPI [name] returns today for followup. She is a pleasant 88 yo woman with a h/o CHB, s/p PPM, 9 years ago. In the interim, she notes no chest pain or sob or edema. She denies falls. She notes that her activity is not quite what it once was. She has developed progressive dementia. He care giver notes that her bp has been running in the 90's. No syncope or falls however.  Allergies  Allergen Reactions   Bee Venom Swelling   Codeine     REACTION: GI distress     Current Outpatient Medications  Medication Sig Dispense Refill   allopurinol (ZYLOPRIM) 300 MG tablet Take 1 tablet (300 mg total) by mouth daily. 30 tablet 0   apixaban (ELIQUIS) 5 MG TABS tablet Take 1 tablet (5 mg total) by mouth 2 (two) times daily. Please keep upcoming appointment for future refills. Thank you. 60 tablet 0   benzonatate (TESSALON) 100 MG capsule Take 100 mg by mouth daily as needed for cough.     Chlorpheniramine-Acetaminophen (CORICIDIN HBP COLD/FLU PO) Take 1 tablet by mouth 2 (two) times daily. As needed for congestion     D-Mannose 500 MG CAPS 2 tabs twice daily (Patient taking differently: Take 2 capsules by mouth 2 (two) times daily.) 120 capsule 5   digoxin (LANOXIN) 0.125 MG tablet Take 1/2 tablet by mouth daily. Please keep upcoming appointment for future refills. Thank you. 15 tablet 0   donepezil (ARICEPT) 5 MG tablet Take 1 tablet (5 mg total) by mouth at bedtime. (Patient taking differently: Take 10 mg by mouth at bedtime.) 30 tablet 0   fluticasone (FLONASE) 50 MCG/ACT nasal spray Place 1 spray into both nostrils daily.     metoprolol succinate (TOPROL-XL) 100 MG 24 hr tablet TAKE (1) TABLET BY MOUTH ONCE DAILY. TAKE WITH A MEAL. 90 tablet 2   vitamin C (ASCORBIC ACID) 500 MG tablet Take 500 mg by mouth daily.     albuterol (VENTOLIN HFA) 108 (90 Base) MCG/ACT inhaler Inhale 2 puffs into the lungs every 6 (six) hours as needed for wheezing or shortness of breath. (Patient not taking:  Reported on 12/28/2021)     diltiazem (CARDIZEM CD) 240 MG 24 hr capsule Take 1 capsule (240 mg total) by mouth daily. (Patient not taking: Reported on 10/12/2021) 30 capsule 3   hydrocortisone (ANUSOL-HC) 2.5 % rectal cream Place 1 application rectally 2 (two) times daily. (Patient not taking: Reported on 10/12/2021) 30 g 0   No current facility-administered medications for this visit.     Past Medical History:  Diagnosis Date   Arthritis    Bilateral chronic knee pain    Brain aneurysm    Brain aneurysm    Brain aneurysm    COPD (chronic obstructive pulmonary disease) (HCC)    DDD (degenerative disc disease), lumbar    Dementia (HCC)    DJD (degenerative joint disease)    DJD (degenerative joint disease)    DVT (deep venous thrombosis) (HCC)    Gout    Hypertension    Peripheral neuropathy    Permanent atrial fibrillation [facility])    MDT [id number] pacemaker 03-26-2013 by Dr Ladona Ridgel   Pulmonary embolism [facility])    Renal disorder     ROS:   All systems reviewed and negative except as noted in the HPI.   Past Surgical History:  Procedure Laterality Date   CARDIAC CATHETERIZATION  2001   Craniotomy clipping  06-2002   unruptured  Left paraclinoid segment anurysum     [facility]   PACEMAKER INSERTION  09-13-1999; 03-26-2013   PPM gen change by Dr Ladona Ridgel 03-26-2013 - MDT [id number]   PERMANENT PACEMAKER GENERATOR CHANGE N/A 03/26/2013   Procedure: PERMANENT PACEMAKER GENERATOR CHANGE;  Surgeon: Marinus Maw, MD;  Location: [facility] CATH LAB;  Service: Cardiovascular;  Laterality: N/A;     Family History  Problem Relation Age of Onset   Heart attack Mother    Heart attack Father    Alzheimer's disease Sister      Social History   Socioeconomic History   Marital status: Widowed    Spouse name: Not on file   Number of children: Not on file   Years of education: Not on file   Highest education level: Not on file  Occupational History   Occupation: retired  from the [employer] company  Tobacco Use   Smoking status: Former    Packs/day: 0.25    Years: 35.00    Total pack years: 8.75    Types: Cigarettes    Start date: 12/27/1977    Quit date: 05/26/2010    Years since quitting: 11.6   Smokeless tobacco: Never  Vaping Use   Vaping Use: Not on file  Substance and Sexual Activity   Alcohol use: No    Alcohol/week: 0.0 standard drinks of alcohol   Drug use: No   Sexual activity: Not on file  Other Topics Concern   Not on file  Social History Narrative   Not on file   Social Determinants of Health   Financial Resource Strain: Not on file  Food Insecurity: Not on file  Transportation Needs: Not on file  Physical Activity: Not on file  Stress: Not on file  Social Connections: Not on file  Intimate Partner Violence: Not on file     BP (!) 140/70   Pulse 60   Ht 5\' 9"  (1.753 m)   Wt 153 lb 3.2 oz (69.5 kg)   SpO2 99%   BMI 22.62 kg/m   Physical Exam:  Well appearing NAD HEENT: Unremarkable Neck:  No JVD, no thyromegally Lymphatics:  No adenopathy Back:  No CVA tenderness Lungs:  Clear with no wheezes HEART:  Regular rate rhythm, no murmurs, no rubs, no clicks Abd:  soft, positive bowel sounds, no organomegally, no rebound, no guarding Ext:  2 plus pulses, no edema, no cyanosis, no clubbing Skin:  No rashes no nodules Neuro:  CN II through XII intact, motor grossly intact  DEVICE  Normal device function.  See PaceArt for details.   Assess/Plan:   HTN - she has a h/o HTN but her pressures are currently controlled. She will continue cardizem. Atrial fib - her VR is well controlled. Hopefully her rates wont increase with cessation of cardizem.  Coags - she has not had any bleeding on eliquis. PPM - her medtronic single chamber PPM is working normally. She has about 9 months of batery longevity.    Anas Reister,MD

## 2022-01-18 ENCOUNTER — Other Ambulatory Visit: Payer: Self-pay | Admitting: Internal Medicine

## 2022-02-24 ENCOUNTER — Other Ambulatory Visit: Payer: Self-pay

## 2022-02-24 ENCOUNTER — Emergency Department (HOSPITAL_COMMUNITY)
Admission: EM | Admit: 2022-02-24 | Discharge: 2022-02-24 | Disposition: A | Discharge status: Skilled Nursing Facility | Payer: Medicare Other | Attending: Emergency Medicine | Admitting: Emergency Medicine

## 2022-02-24 DIAGNOSIS — Z7951 Long term (current) use of inhaled steroids: Secondary | ICD-10-CM | POA: Diagnosis not present

## 2022-02-24 DIAGNOSIS — J449 Chronic obstructive pulmonary disease, unspecified: Secondary | ICD-10-CM | POA: Insufficient documentation

## 2022-02-24 DIAGNOSIS — R1032 Left lower quadrant pain: Secondary | ICD-10-CM | POA: Insufficient documentation

## 2022-02-24 DIAGNOSIS — R1084 Generalized abdominal pain: Secondary | ICD-10-CM | POA: Diagnosis present

## 2022-02-24 DIAGNOSIS — Z7901 Long term (current) use of anticoagulants: Secondary | ICD-10-CM | POA: Insufficient documentation

## 2022-02-24 DIAGNOSIS — F0390 Unspecified dementia without behavioral disturbance: Secondary | ICD-10-CM | POA: Diagnosis not present

## 2022-02-24 DIAGNOSIS — I4891 Unspecified atrial fibrillation: Secondary | ICD-10-CM | POA: Insufficient documentation

## 2022-02-24 LAB — COMPREHENSIVE METABOLIC PANEL
ALT: 6 U/L (ref 0–44)
AST: 29 U/L (ref 15–41)
Albumin: 3.8 g/dL (ref 3.5–5.0)
Alkaline Phosphatase: 67 U/L (ref 38–126)
Anion gap: 7 (ref 5–15)
BUN: 12 mg/dL (ref 8–23)
CO2: 27 mmol/L (ref 22–32)
Calcium: 9 mg/dL (ref 8.9–10.3)
Chloride: 106 mmol/L (ref 98–111)
Creatinine, Ser: 0.87 mg/dL (ref 0.44–1.00)
GFR, Estimated: >60 mL/min (ref >60)
Glucose, Bld: 75 mg/dL (ref 70–99)
Potassium: 4.9 mmol/L (ref 3.5–5.1)
Sodium: 140 mmol/L (ref 135–145)
Total Bilirubin: 1.6 mg/dL — ABNORMAL HIGH (ref 0.3–1.2)
Total Protein: 7.2 g/dL (ref 6.5–8.1)

## 2022-02-24 LAB — CBC WITH DIFFERENTIAL/PLATELET
Abs Immature Granulocytes: 0.04 10*3/uL (ref 0.00–0.07)
Basophils Absolute: 0 10*3/uL (ref 0.0–0.1)
Basophils Relative: 1 %
Eosinophils Absolute: 0.1 10*3/uL (ref 0.0–0.5)
Eosinophils Relative: 2 %
HCT: 42.8 % (ref 36.0–46.0)
Hemoglobin: 14.2 g/dL (ref 12.0–15.0)
Immature Granulocytes: 1 %
Lymphocytes Relative: 14 %
Lymphs Abs: 1.1 10*3/uL (ref 0.7–4.0)
MCH: 34.2 pg — ABNORMAL HIGH (ref 26.0–34.0)
MCHC: 33.2 g/dL (ref 30.0–36.0)
MCV: 103.1 fL — ABNORMAL HIGH (ref 80.0–100.0)
Monocytes Absolute: 0.7 10*3/uL (ref 0.1–1.0)
Monocytes Relative: 9 %
Neutro Abs: 5.5 10*3/uL (ref 1.7–7.7)
Neutrophils Relative %: 73 %
Platelets: 149 10*3/uL — ABNORMAL LOW (ref 150–400)
RBC: 4.15 MIL/uL (ref 3.87–5.11)
RDW: 13.8 % (ref 11.5–15.5)
WBC: 7.4 10*3/uL (ref 4.0–10.5)
nRBC: 0 % (ref 0.0–0.2)

## 2022-02-24 LAB — LIPASE, BLOOD: Lipase: 26 U/L (ref 11–51)

## 2022-02-24 MED ORDER — SODIUM CHLORIDE 0.9 % IV BOLUS
1000.0000 mL | Freq: Once | INTRAVENOUS | Status: AC
  Administered 2022-02-24: 1000 mL via INTRAVENOUS

## 2022-02-24 NOTE — ED Notes (Signed)
[facility]  [phone]

## 2022-02-24 NOTE — Discharge Instructions (Signed)
Follow-up with your family doctor next week for recheck. 

## 2022-02-24 NOTE — ED Notes (Signed)
Patient has a prolapsed rectum confirmed by charge nurse.

## 2022-02-24 NOTE — ED Provider Notes (Signed)
[facility] EMERGENCY DEPARTMENT Provider Note   CSN: [id number] Arrival date & time: 02/24/22  1208     History {Add pertinent medical, surgical, social history, OB history to HPI:1} Chief Complaint  Patient presents with   Abdominal Pain    Sitting on toilet at group home and said she flet like her stomach was being torn apart    [name] is a 88 y.o. female.  Patient has a history of atrial fibs dementia COPD.  She was on the commode and had abdominal discomfort   Abdominal Pain      Home Medications Prior to Admission medications   Medication Sig Start Date End Date Taking? Authorizing Provider  albuterol (VENTOLIN HFA) 108 (90 Base) MCG/ACT inhaler Inhale 2 puffs into the lungs every 6 (six) hours as needed for wheezing or shortness of breath. Patient not taking: Reported on 12/28/2021    [provider]  allopurinol (ZYLOPRIM) 300 MG tablet Take 1 tablet (300 mg total) by mouth daily. 07/29/19   Wurst, [place], PA-C  apixaban (ELIQUIS) 5 MG TABS tablet Take 1 tablet (5 mg total) by mouth 2 (two) times daily. Please keep upcoming appointment for future refills. Thank you. 12/19/21   Marinus Maw, MD  benzonatate (TESSALON) 100 MG capsule Take 100 mg by mouth daily as needed for cough.    [provider]  Chlorpheniramine-Acetaminophen (CORICIDIN HBP COLD/FLU PO) Take 1 tablet by mouth 2 (two) times daily. As needed for congestion    [provider]  D-Mannose 500 MG CAPS 2 tabs twice daily Patient taking differently: Take 2 capsules by mouth 2 (two) times daily. 01/25/21   Stoioff, Verna Czech, MD  digoxin (LANOXIN) 0.125 MG tablet TAKE (1/2) TABLET BY MOUTH ONCE DAILY. 01/18/22   Marinus Maw, MD  diltiazem (CARDIZEM CD) 240 MG 24 hr capsule Take 1 capsule (240 mg total) by mouth daily. Patient not taking: Reported on 10/12/2021 09/02/20   Marinus Maw, MD  donepezil (ARICEPT) 5 MG tablet Take 1 tablet (5 mg total) by mouth at  bedtime. Patient taking differently: Take 10 mg by mouth at bedtime. 07/29/19   Wurst, [place], PA-C  fluticasone (FLONASE) 50 MCG/ACT nasal spray Place 1 spray into both nostrils daily.    [provider]  hydrocortisone (ANUSOL-HC) 2.5 % rectal cream Place 1 application rectally 2 (two) times daily. Patient not taking: Reported on 10/12/2021 12/27/19   Lorelee New, PA-C  metoprolol succinate (TOPROL-XL) 100 MG 24 hr tablet TAKE (1) TABLET BY MOUTH ONCE DAILY. TAKE WITH A MEAL. 07/19/21   Marinus Maw, MD  vitamin C (ASCORBIC ACID) 500 MG tablet Take 500 mg by mouth daily.    [provider]      Allergies    Bee venom and Codeine    Review of Systems   Review of Systems  Gastrointestinal:  Positive for abdominal pain.    Physical Exam Updated Vital Signs BP 99/77   Pulse 64   Temp 97.6 F (36.4 C) (Oral)   Resp 16   Ht 5\' 6"  (1.676 m)   Wt 65.8 kg   SpO2 100%   BMI 23.40 kg/m  Physical Exam  ED Results / Procedures / Treatments   Labs (all labs ordered are listed, but only abnormal results are displayed) Labs Reviewed  CBC WITH DIFFERENTIAL/PLATELET - Abnormal; Notable for the following components:      Result Value   MCV 103.1 (*)    MCH 34.2 (*)  Platelets 149 (*)    All other components within normal limits  COMPREHENSIVE METABOLIC PANEL - Abnormal; Notable for the following components:   Total Bilirubin 1.6 (*)    All other components within normal limits  LIPASE, BLOOD    EKG None  Radiology No results found.  Procedures Procedures  {Document cardiac monitor, telemetry assessment procedure when appropriate:1}  Medications Ordered in ED Medications  sodium chloride 0.9 % bolus 1,000 mL (0 mLs Intravenous Stopped 02/24/22 1433)    ED Course/ Medical Decision Making/ A&P  Patient with dementia abdominal pain earlier today.  Patient no longer having abdominal discomfort.                           Medical Decision  Making Amount and/or Complexity of Data Reviewed Labs: ordered.   Patient with abdominal pain that has improved.  She will follow-up with her family doctor as needed  {Document critical care time when appropriate:1} {Document review of labs and clinical decision tools ie heart score, Chads2Vasc2 etc:1}  {Document your independent review of radiology images, and any outside records:1} {Document your discussion with family members, caretakers, and with consultants:1} {Document social determinants of health affecting pt's care:1} {Document your decision making why or why not admission, treatments were needed:1} Final Clinical Impression(s) / ED Diagnoses Final diagnoses:  Left lower quadrant abdominal pain    Rx / DC Orders ED Discharge Orders     None

## 2022-02-24 NOTE — ED Triage Notes (Signed)
Patient has a DNR from the [facility] ED MD said it is acceptable for DNR status.

## 2022-03-19 ENCOUNTER — Other Ambulatory Visit: Payer: Self-pay | Admitting: Internal Medicine

## 2022-03-19 DIAGNOSIS — I4891 Unspecified atrial fibrillation: Secondary | ICD-10-CM

## 2022-03-19 NOTE — Telephone Encounter (Signed)
Prescription refill request for Eliquis received. Indication: AF Last office visit: 12/28/21  Rosette Reveal MD Scr: 0.87 on 02/24/22 Age: 88 Weight: 69.5kg  Based on above findings Eliquis 5mg  twice daily is the appropriate dose.  Refill approved.

## 2022-07-16 ENCOUNTER — Other Ambulatory Visit: Payer: Self-pay | Admitting: Internal Medicine

## 2022-07-16 DIAGNOSIS — I10 Essential (primary) hypertension: Secondary | ICD-10-CM

## 2022-10-11 ENCOUNTER — Other Ambulatory Visit: Payer: Self-pay | Admitting: Internal Medicine

## 2022-10-11 DIAGNOSIS — I4891 Unspecified atrial fibrillation: Secondary | ICD-10-CM

## 2022-10-11 NOTE — Telephone Encounter (Signed)
Prescription refill request for Eliquis received. Indication: AF Last office visit: 12/28/21  Rosette Reveal MD Scr: 0.87 on 02/24/22  Epic Age: 89 Weight: 69.5  Based on above findings Eliquis 5mg  twice daily is the appropriate dose.  Refill approved.

## 2022-10-15 ENCOUNTER — Encounter (HOSPITAL_COMMUNITY): Payer: Self-pay | Admitting: Emergency Medicine

## 2022-10-15 ENCOUNTER — Emergency Department (HOSPITAL_COMMUNITY)
Admission: EM | Admit: 2022-10-15 | Discharge: 2022-10-16 | Disposition: A | Discharge status: Home / Self Care | Payer: Medicare Other | Attending: Emergency Medicine | Admitting: Emergency Medicine

## 2022-10-15 ENCOUNTER — Other Ambulatory Visit: Payer: Self-pay

## 2022-10-15 DIAGNOSIS — Z7901 Long term (current) use of anticoagulants: Secondary | ICD-10-CM | POA: Insufficient documentation

## 2022-10-15 DIAGNOSIS — F0390 Unspecified dementia without behavioral disturbance: Secondary | ICD-10-CM | POA: Insufficient documentation

## 2022-10-15 DIAGNOSIS — J449 Chronic obstructive pulmonary disease, unspecified: Secondary | ICD-10-CM | POA: Insufficient documentation

## 2022-10-15 DIAGNOSIS — N3000 Acute cystitis without hematuria: Secondary | ICD-10-CM | POA: Diagnosis not present

## 2022-10-15 DIAGNOSIS — Z79899 Other long term (current) drug therapy: Secondary | ICD-10-CM | POA: Insufficient documentation

## 2022-10-15 DIAGNOSIS — I10 Essential (primary) hypertension: Secondary | ICD-10-CM | POA: Diagnosis not present

## 2022-10-15 DIAGNOSIS — R350 Frequency of micturition: Secondary | ICD-10-CM | POA: Diagnosis present

## 2022-10-15 DIAGNOSIS — Z20822 Contact with and (suspected) exposure to covid-19: Secondary | ICD-10-CM | POA: Diagnosis not present

## 2022-10-15 LAB — CBC WITH DIFFERENTIAL/PLATELET
Abs Immature Granulocytes: 0.03 10*3/uL (ref 0.00–0.07)
Basophils Absolute: 0 10*3/uL (ref 0.0–0.1)
Basophils Relative: 1 %
Eosinophils Absolute: 0.1 10*3/uL (ref 0.0–0.5)
Eosinophils Relative: 1 %
HCT: 35.2 % — ABNORMAL LOW (ref 36.0–46.0)
Hemoglobin: 11.7 g/dL — ABNORMAL LOW (ref 12.0–15.0)
Immature Granulocytes: 1 %
Lymphocytes Relative: 18 %
Lymphs Abs: 1 10*3/uL (ref 0.7–4.0)
MCH: 34.2 pg — ABNORMAL HIGH (ref 26.0–34.0)
MCHC: 33.2 g/dL (ref 30.0–36.0)
MCV: 102.9 fL — ABNORMAL HIGH (ref 80.0–100.0)
Monocytes Absolute: 0.4 10*3/uL (ref 0.1–1.0)
Monocytes Relative: 8 %
Neutro Abs: 4.2 10*3/uL (ref 1.7–7.7)
Neutrophils Relative %: 71 %
Platelets: 141 10*3/uL — ABNORMAL LOW (ref 150–400)
RBC: 3.42 MIL/uL — ABNORMAL LOW (ref 3.87–5.11)
RDW: 14.3 % (ref 11.5–15.5)
WBC: 5.7 10*3/uL (ref 4.0–10.5)
nRBC: 0.3 % — ABNORMAL HIGH (ref 0.0–0.2)

## 2022-10-15 LAB — COMPREHENSIVE METABOLIC PANEL
ALT: 14 U/L (ref 0–44)
AST: 18 U/L (ref 15–41)
Albumin: 2.8 g/dL — ABNORMAL LOW (ref 3.5–5.0)
Alkaline Phosphatase: 56 U/L (ref 38–126)
Anion gap: 5 (ref 5–15)
BUN: 17 mg/dL (ref 8–23)
CO2: 26 mmol/L (ref 22–32)
Calcium: 8.3 mg/dL — ABNORMAL LOW (ref 8.9–10.3)
Chloride: 104 mmol/L (ref 98–111)
Creatinine, Ser: 0.72 mg/dL (ref 0.44–1.00)
GFR, Estimated: >60 mL/min (ref >60)
Glucose, Bld: 89 mg/dL (ref 70–99)
Potassium: 3.9 mmol/L (ref 3.5–5.1)
Sodium: 135 mmol/L (ref 135–145)
Total Bilirubin: 0.9 mg/dL (ref 0.3–1.2)
Total Protein: 6.7 g/dL (ref 6.5–8.1)

## 2022-10-15 LAB — URINALYSIS, ROUTINE W REFLEX MICROSCOPIC
Bilirubin Urine: NEGATIVE
Glucose, UA: NEGATIVE mg/dL
Hgb urine dipstick: NEGATIVE
Ketones, ur: NEGATIVE mg/dL
Nitrite: POSITIVE — AB
Protein, ur: NEGATIVE mg/dL
RBC / HPF: >50 RBC/hpf (ref 0–5)
Specific Gravity, Urine: 1.02 (ref 1.005–1.030)
WBC, UA: >50 WBC/hpf (ref 0–5)
pH: 5 (ref 5.0–8.0)

## 2022-10-15 LAB — SARS CORONAVIRUS 2 BY RT PCR: SARS Coronavirus 2 by RT PCR: NEGATIVE

## 2022-10-15 MED ORDER — SODIUM CHLORIDE 0.9 % IV SOLN
1.0000 g | Freq: Once | INTRAVENOUS | Status: AC
  Administered 2022-10-15: 1 g via INTRAVENOUS
  Filled 2022-10-15: qty 10

## 2022-10-15 MED ORDER — CEPHALEXIN 500 MG PO CAPS: 500.0000 mg | ORAL_CAPSULE | Freq: Four times a day (QID) | ORAL | 0 refills | Status: DC

## 2022-10-15 NOTE — Discharge Instructions (Signed)
Her workup this evening shows that she has a urinary tract infection.  Is important that she takes the antibiotic as directed until finished.  Please follow-up with her primary care provider for recheck of her urine after the antibiotics are completed.  Return to the emergency department for any new or worsening symptoms.

## 2022-10-15 NOTE — ED Triage Notes (Addendum)
Pt states she woke up with body aches and generalized weakness. Facility told ems pt has had frequent urination.

## 2022-10-15 NOTE — ED Provider Notes (Signed)
[place] EMERGENCY DEPARTMENT AT [facility] Provider Note   CSN: [id number] Arrival date & time: 10/15/22  1719     History  Chief Complaint  Patient presents with   Generalized Body Aches    [name] is a 89 y.o. female.  HPI      [name] is a 89 y.o. female who comes in from local adult care facility with past medical history of kidney stones, atrial fibrillation, COPD, hypertension, dementia, chronically anticoagulated who presents to the Emergency Department for evaluation of generalized bodyaches and weakness with frequent urination.  Caregiver at the facility states patient has had frequent urination for few days.  No reported nausea vomiting or diarrhea.  No fever or chills.  Home Medications Prior to Admission medications   Medication Sig Start Date End Date Taking? Authorizing Provider  albuterol (VENTOLIN HFA) 108 (90 Base) MCG/ACT inhaler Inhale 2 puffs into the lungs every 6 (six) hours as needed for wheezing or shortness of breath. Patient not taking: Reported on 12/28/2021    [provider]  allopurinol (ZYLOPRIM) 300 MG tablet Take 1 tablet (300 mg total) by mouth daily. 07/29/19   Wurst, [place], PA-C  apixaban (ELIQUIS) 5 MG TABS tablet TAKE (1) TABLET BY MOUTH TWICE DAILY. 10/11/22   Marinus Maw, MD  benzonatate (TESSALON) 100 MG capsule Take 100 mg by mouth daily as needed for cough.    [provider]  Chlorpheniramine-Acetaminophen (CORICIDIN HBP COLD/FLU PO) Take 1 tablet by mouth 2 (two) times daily. As needed for congestion    [provider]  D-Mannose 500 MG CAPS 2 tabs twice daily Patient taking differently: Take 2 capsules by mouth 2 (two) times daily. 01/25/21   Stoioff, Verna Czech, MD  digoxin (LANOXIN) 0.125 MG tablet TAKE (1/2) TABLET BY MOUTH ONCE DAILY. 01/18/22   Marinus Maw, MD  diltiazem (CARDIZEM CD) 240 MG 24 hr capsule Take 1 capsule (240 mg total) by mouth daily. Patient not  taking: Reported on 10/12/2021 09/02/20   Marinus Maw, MD  donepezil (ARICEPT) 5 MG tablet Take 1 tablet (5 mg total) by mouth at bedtime. Patient taking differently: Take 10 mg by mouth at bedtime. 07/29/19   Wurst, [place], PA-C  fluticasone (FLONASE) 50 MCG/ACT nasal spray Place 1 spray into both nostrils daily.    [provider]  hydrocortisone (ANUSOL-HC) 2.5 % rectal cream Place 1 application rectally 2 (two) times daily. Patient not taking: Reported on 10/12/2021 12/27/19   Lorelee New, PA-C  metoprolol succinate (TOPROL-XL) 100 MG 24 hr tablet TAKE (1) TABLET BY MOUTH ONCE DAILY. TAKE WITH A MEAL. 07/16/22   Marinus Maw, MD  vitamin C (ASCORBIC ACID) 500 MG tablet Take 500 mg by mouth daily.    [provider]      Allergies    Bee venom and Codeine    Review of Systems   Review of Systems  Unable to perform ROS: Dementia  Respiratory:  Negative for shortness of breath.   Cardiovascular:  Negative for chest pain and leg swelling.  Gastrointestinal:  Positive for nausea. Negative for abdominal pain and vomiting.  Genitourinary:  Positive for flank pain and frequency. Negative for difficulty urinating, dysuria and hematuria.  Musculoskeletal:  Positive for back pain.    Physical Exam Updated Vital Signs BP 129/65   Pulse 62   Temp 97.6 F (36.4 C) (Oral)   Resp 15   Ht 5\' 6"  (1.676 m)  Wt 65 kg   SpO2 97%   BMI 23.13 kg/m  Physical Exam Vitals and nursing note reviewed.  Constitutional:      General: She is not in acute distress.    Appearance: Normal appearance. She is not ill-appearing or toxic-appearing.  HENT:     Head: Atraumatic.     Mouth/Throat:     Mouth: Mucous membranes are moist.  Cardiovascular:     Rate and Rhythm: Normal rate and regular rhythm.     Pulses: Normal pulses.  Pulmonary:     Effort: Pulmonary effort is normal.     Breath sounds: Normal breath sounds.  Abdominal:     General: There is no distension.      Palpations: Abdomen is soft.     Tenderness: There is no abdominal tenderness. There is no right CVA tenderness or left CVA tenderness.  Musculoskeletal:        General: Normal range of motion.     Cervical back: Normal range of motion. No rigidity.  Lymphadenopathy:     Cervical: No cervical adenopathy.  Skin:    General: Skin is warm.     Capillary Refill: Capillary refill takes less than 2 seconds.  Neurological:     General: No focal deficit present.     Mental Status: She is alert.     Sensory: Sensation is intact. No sensory deficit.     Motor: Motor function is intact. No weakness.     Comments: CN II through XII grossly intact.  Patient follows commands well.  Speech clear.     ED Results / Procedures / Treatments   Labs (all labs ordered are listed, but only abnormal results are displayed) Labs Reviewed  CBC WITH DIFFERENTIAL/PLATELET - Abnormal; Notable for the following components:      Result Value   RBC 3.42 (*)    Hemoglobin 11.7 (*)    HCT 35.2 (*)    MCV 102.9 (*)    MCH 34.2 (*)    Platelets 141 (*)    nRBC 0.3 (*)    All other components within normal limits  COMPREHENSIVE METABOLIC PANEL - Abnormal; Notable for the following components:   Calcium 8.3 (*)    Albumin 2.8 (*)    All other components within normal limits  URINALYSIS, ROUTINE W REFLEX MICROSCOPIC - Abnormal; Notable for the following components:   Color, Urine AMBER (*)    APPearance HAZY (*)    Nitrite POSITIVE (*)    Leukocytes,Ua SMALL (*)    Bacteria, UA MANY (*)    All other components within normal limits  SARS CORONAVIRUS 2 BY RT PCR  URINE CULTURE    EKG EKG Interpretation  Date/Time:  Monday October 15 2022 21:15:23 EDT Ventricular Rate:  60 PR Interval:  63 QRS Duration: 156 QT Interval:  487 QTC Calculation: 487 R Axis:   -65 Text Interpretation: Sinus rhythm Short PR interval Left bundle branch block Baseline wander in lead(s) II III aVF Confirmed by Bethann Berkshire  [phone]) on 10/15/2022 11:05:45 PM  Radiology No results found.  Procedures Procedures    Medications Ordered in ED Medications  cefTRIAXone (ROCEPHIN) 1 g in sodium chloride 0.9 % 100 mL IVPB (1 g Intravenous New Bag/Given 10/15/22 2223)    ED Course/ Medical Decision Making/ A&P                             Medical Decision Making Patient here  from local adult care facility has history of dementia.  Brought in for evaluation of generalized bodyaches and weakness with reports of frequent urination by care facility.  Concern for possible UTI.  No reported fever chills nausea or vomiting.  Differential would include but not limited to viral process, UTI, pyelonephritis, ACS, acute neurological process, sepsis, all considered.  UTI felt to be likely as patient does not appear toxic has reassuring vital signs without fever.  Amount and/or Complexity of Data Reviewed Labs: ordered.    Details: Labs interpreted by me, no evidence of leukocytosis, chemistries without significant derangement, COVID test negative, urinalysis shows positive nitrites with leukocytes and greater than 50 white cells with many bacteria.  Urine culture pending ECG/medicine tests: ordered.    Details: EKG shows sinus rhythm with short PR interval and left bundle branch block Discussion of management or test interpretation with external provider(s): Patient has been observed in the department without complication.  Denies any symptoms.  Has been alert and cooperative.  Vital signs are reassuring.  Does appear to have UTI.  No concerning symptoms for sepsis.  Rocephin given here with urine culture pending.  I feel she is appropriate for discharge back to facility will prescribe cephalexin with recommendations of close outpatient follow-up with PCP to ensure resolution of UTI.  Return precautions also given.  Risk Prescription drug management.           Final Clinical Impression(s) / ED Diagnoses Final  diagnoses:  Acute cystitis without hematuria    Rx / DC Orders ED Discharge Orders     None         Pauline Aus, PA-C 10/18/22 1539    Bethann Berkshire, MD 10/20/22 1201

## 2022-10-18 LAB — URINE CULTURE: Culture: >=100000 — AB

## 2022-10-19 ENCOUNTER — Telehealth (HOSPITAL_BASED_OUTPATIENT_CLINIC_OR_DEPARTMENT_OTHER): Payer: Self-pay | Admitting: Emergency Medicine

## 2022-10-19 NOTE — Telephone Encounter (Signed)
Post ED Visit - Positive Culture Follow-up  Culture report reviewed by antimicrobial stewardship pharmacist: [facility] []  Enzo Bi, Pharm.D. []  Celedonio Miyamoto, Pharm.D., BCPS AQ-ID []  Garvin Fila, Pharm.D., BCPS []  Georgina Pillion, Pharm.D., BCPS []  [place], [place].D., BCPS, AAHIVP []  Estella Husk, Pharm.D., BCPS, AAHIVP []  Lysle Pearl, PharmD, BCPS []  Phillips Climes, PharmD, BCPS []  Agapito Games, PharmD, BCPS [x]  Andreas Ohm, PharmD []  Mervyn Gay, PharmD, BCPS []  Vinnie Level, PharmD  [facility] []  Len Childs, PharmD []  Greer Pickerel, PharmD []  Adalberto Cole, PharmD []  Perlie Gold, Rph []  Lonell Face) Jean Rosenthal, PharmD []  Earl Many, PharmD []  Junita Push, PharmD []  Dorna Leitz, PharmD []  Terrilee Files, PharmD []  Lynann Beaver, PharmD []  Keturah Barre, PharmD []  Loralee Pacas, PharmD []  Bernadene Person, PharmD   Positive urine culture Treated with Cephalexin, organism sensitive to the same and no further patient follow-up is required at this time.  Tanna Savoy Ralston Venus 10/19/2022, 4:25 PM

## 2022-11-29 ENCOUNTER — Emergency Department (HOSPITAL_COMMUNITY): Payer: Medicare Other

## 2022-11-29 ENCOUNTER — Emergency Department (HOSPITAL_COMMUNITY)
Admission: EM | Admit: 2022-11-29 | Discharge: 2022-11-29 | Disposition: A | Discharge status: Home / Self Care | Payer: Medicare Other | Attending: Emergency Medicine | Admitting: Emergency Medicine

## 2022-11-29 ENCOUNTER — Other Ambulatory Visit: Payer: Self-pay

## 2022-11-29 ENCOUNTER — Encounter (HOSPITAL_COMMUNITY): Payer: Self-pay | Admitting: Emergency Medicine

## 2022-11-29 DIAGNOSIS — N390 Urinary tract infection, site not specified: Secondary | ICD-10-CM | POA: Diagnosis not present

## 2022-11-29 DIAGNOSIS — R4182 Altered mental status, unspecified: Secondary | ICD-10-CM | POA: Diagnosis present

## 2022-11-29 DIAGNOSIS — Z950 Presence of cardiac pacemaker: Secondary | ICD-10-CM | POA: Diagnosis not present

## 2022-11-29 DIAGNOSIS — Z79899 Other long term (current) drug therapy: Secondary | ICD-10-CM | POA: Diagnosis not present

## 2022-11-29 DIAGNOSIS — Z7901 Long term (current) use of anticoagulants: Secondary | ICD-10-CM | POA: Diagnosis not present

## 2022-11-29 DIAGNOSIS — F0390 Unspecified dementia without behavioral disturbance: Secondary | ICD-10-CM | POA: Insufficient documentation

## 2022-11-29 DIAGNOSIS — D696 Thrombocytopenia, unspecified: Secondary | ICD-10-CM | POA: Insufficient documentation

## 2022-11-29 DIAGNOSIS — R319 Hematuria, unspecified: Secondary | ICD-10-CM

## 2022-11-29 LAB — COMPREHENSIVE METABOLIC PANEL
ALT: 11 U/L (ref 0–44)
AST: 18 U/L (ref 15–41)
Albumin: 2.9 g/dL — ABNORMAL LOW (ref 3.5–5.0)
Alkaline Phosphatase: 59 U/L (ref 38–126)
Anion gap: 7 (ref 5–15)
BUN: 16 mg/dL (ref 8–23)
CO2: 25 mmol/L (ref 22–32)
Calcium: 8.4 mg/dL — ABNORMAL LOW (ref 8.9–10.3)
Chloride: 103 mmol/L (ref 98–111)
Creatinine, Ser: 0.83 mg/dL (ref 0.44–1.00)
GFR, Estimated: >60 mL/min (ref >60)
Glucose, Bld: 99 mg/dL (ref 70–99)
Potassium: 4 mmol/L (ref 3.5–5.1)
Sodium: 135 mmol/L (ref 135–145)
Total Bilirubin: 1.4 mg/dL — ABNORMAL HIGH (ref 0.3–1.2)
Total Protein: 7.3 g/dL (ref 6.5–8.1)

## 2022-11-29 LAB — CBC WITH DIFFERENTIAL/PLATELET
Abs Immature Granulocytes: 0.04 10*3/uL (ref 0.00–0.07)
Basophils Absolute: 0 10*3/uL (ref 0.0–0.1)
Basophils Relative: 0 %
Eosinophils Absolute: 0 10*3/uL (ref 0.0–0.5)
Eosinophils Relative: 1 %
HCT: 36.9 % (ref 36.0–46.0)
Hemoglobin: 11.8 g/dL — ABNORMAL LOW (ref 12.0–15.0)
Immature Granulocytes: 1 %
Lymphocytes Relative: 14 %
Lymphs Abs: 0.8 10*3/uL (ref 0.7–4.0)
MCH: 33.3 pg (ref 26.0–34.0)
MCHC: 32 g/dL (ref 30.0–36.0)
MCV: 104.2 fL — ABNORMAL HIGH (ref 80.0–100.0)
Monocytes Absolute: 0.5 10*3/uL (ref 0.1–1.0)
Monocytes Relative: 9 %
Neutro Abs: 4 10*3/uL (ref 1.7–7.7)
Neutrophils Relative %: 75 %
Platelets: 148 10*3/uL — ABNORMAL LOW (ref 150–400)
RBC: 3.54 MIL/uL — ABNORMAL LOW (ref 3.87–5.11)
RDW: 14.3 % (ref 11.5–15.5)
WBC: 5.4 10*3/uL (ref 4.0–10.5)
nRBC: 0 % (ref 0.0–0.2)

## 2022-11-29 LAB — URINALYSIS, ROUTINE W REFLEX MICROSCOPIC
Bilirubin Urine: NEGATIVE
Glucose, UA: NEGATIVE mg/dL
Hgb urine dipstick: NEGATIVE
Ketones, ur: NEGATIVE mg/dL
Nitrite: NEGATIVE
Protein, ur: 100 mg/dL — AB
Specific Gravity, Urine: 1.017 (ref 1.005–1.030)
WBC, UA: >50 WBC/hpf (ref 0–5)
pH: 5 (ref 5.0–8.0)

## 2022-11-29 MED ORDER — CEPHALEXIN 500 MG PO CAPS: 500.0000 mg | ORAL_CAPSULE | Freq: Four times a day (QID) | ORAL | 0 refills | Status: DC

## 2022-11-29 MED ORDER — IOHEXOL 300 MG/ML  SOLN
100.0000 mL | Freq: Once | INTRAMUSCULAR | Status: AC | PRN
  Administered 2022-11-29: 100 mL via INTRAVENOUS

## 2022-11-29 MED ORDER — SODIUM CHLORIDE 0.9 % IV SOLN
1.0000 g | Freq: Once | INTRAVENOUS | Status: AC
  Administered 2022-11-29: 1 g via INTRAVENOUS
  Filled 2022-11-29: qty 10

## 2022-11-29 NOTE — ED Triage Notes (Signed)
Pt from family care home via [facility] with reports of AMS. Per EMS pt was using the bathroom and care giver went to check on pt and pt was "more altered than her usual."

## 2022-11-29 NOTE — ED Notes (Signed)
[facility] called to advise of pt d/c

## 2022-11-29 NOTE — Discharge Instructions (Signed)
Patient seen and evaluated here for urinary tract infection Urine culture is sent She received 1 g of Rocephin here in the ED and has a prescription for Keflex.  Please make sure that she is drinking plenty of fluids.  Have her return to the emergency department if she is having worsening symptoms such as high fever, increased pain, or inability to tolerate liquids or medication.

## 2022-11-29 NOTE — ED Notes (Signed)
Pt has been difficult stick and in/out cath. Iv just obtained. Pt in nad

## 2022-11-29 NOTE — ED Provider Notes (Signed)
[place] EMERGENCY DEPARTMENT AT [facility] Provider Note   CSN: [id number] Arrival date & time: 11/29/22  1014     History  Chief Complaint  Patient presents with   Altered Mental Status    [name] is a 89 y.o. female.  HPI 89 year old female history of dementia, chronic A-fib, tachybradycardia syndrome, status postcardiac pacemaker, history of DVT, presents today from assisted living care facility where she has a Comptroller with reports that she seemed weaker than usual.  Received report from RN who had spoken with EMS.  They reported patient went into the bathroom and came out and seemed somewhat different from her usual self.  There is no report of fall, syncope, pain, fever.  Patient is awake and cooperative she does not have any complaints at this time.  Review of recent visits notes visit in June with diagnosis of acute cystitis with hematuria and ED visit 02/23/2022 for abdominal pain that improved and had no definitive diagnosis    Home Medications Prior to Admission medications   Medication Sig Start Date End Date Taking? Authorizing Provider  allopurinol (ZYLOPRIM) 300 MG tablet Take 1 tablet (300 mg total) by mouth daily. 07/29/19  Yes Wurst, [place], PA-C  apixaban (ELIQUIS) 5 MG TABS tablet TAKE (1) TABLET BY MOUTH TWICE DAILY. 10/11/22  Yes Marinus Maw, MD  cephALEXin (KEFLEX) 500 MG capsule Take 1 capsule (500 mg total) by mouth 4 (four) times daily. 11/29/22  Yes Margarita Grizzle, MD  D-Mannose 500 MG CAPS 2 tabs twice daily Patient taking differently: Take 1 capsule by mouth 2 (two) times daily. 01/25/21  Yes Stoioff, Verna Czech, MD  digoxin (LANOXIN) 0.125 MG tablet TAKE (1/2) TABLET BY MOUTH ONCE DAILY. 01/18/22  Yes Marinus Maw, MD  donepezil (ARICEPT) 10 MG tablet Take 10 mg by mouth daily. 11/16/22  Yes [provider]  fluticasone (FLONASE) 50 MCG/ACT nasal spray Place 1 spray into both nostrils daily.   Yes [provider]   metoprolol succinate (TOPROL-XL) 100 MG 24 hr tablet TAKE (1) TABLET BY MOUTH ONCE DAILY. TAKE WITH A MEAL. Patient taking differently: Take 100 mg by mouth daily. 07/16/22  Yes Marinus Maw, MD  nitrofurantoin (MACRODANTIN) 100 MG capsule Take 100 mg by mouth daily. 11/16/22  Yes [provider]  vitamin C (ASCORBIC ACID) 500 MG tablet Take 500 mg by mouth daily.   Yes [provider]  benzonatate (TESSALON) 100 MG capsule Take 100 mg by mouth daily as needed for cough.    [provider]      Allergies    Bee venom and Codeine    Review of Systems   Review of Systems  Physical Exam Updated Vital Signs BP (!) 157/66   Pulse 60   Temp (!) 97.5 F (36.4 C) (Oral)   Resp 14   SpO2 95%  Physical Exam HENT:     Head: Normocephalic.     Right Ear: External ear normal.     Left Ear: External ear normal.     Nose: Nose normal.     Mouth/Throat:     Pharynx: Oropharynx is clear.  Eyes:     Extraocular Movements: Extraocular movements intact.     Pupils: Pupils are equal, round, and reactive to light.  Cardiovascular:     Rate and Rhythm: Normal rate and regular rhythm.     Pulses: Normal pulses.  Pulmonary:     Effort: Pulmonary effort is normal.  Breath sounds: Normal breath sounds.  Abdominal:     General: Abdomen is flat.     Tenderness: There is abdominal tenderness.     Comments: Moderate diffuse lower abdominal pain no rebound, or guarding noted  Musculoskeletal:        General: Normal range of motion.     Cervical back: Normal range of motion.  Skin:    General: Skin is warm and dry.     Capillary Refill: Capillary refill takes less than 2 seconds.  Neurological:     General: No focal deficit present.     Mental Status: She is alert.  Psychiatric:        Mood and Affect: Mood normal.        Behavior: Behavior normal.     ED Results / Procedures / Treatments   Labs (all labs ordered are listed, but only abnormal results are  displayed) Labs Reviewed  URINALYSIS, ROUTINE W REFLEX MICROSCOPIC - Abnormal; Notable for the following components:      Result Value   Color, Urine AMBER (*)    APPearance HAZY (*)    Protein, ur 100 (*)    Leukocytes,Ua MODERATE (*)    Bacteria, UA MANY (*)    All other components within normal limits  CBC WITH DIFFERENTIAL/PLATELET - Abnormal; Notable for the following components:   RBC 3.54 (*)    Hemoglobin 11.8 (*)    MCV 104.2 (*)    Platelets 148 (*)    All other components within normal limits  COMPREHENSIVE METABOLIC PANEL - Abnormal; Notable for the following components:   Calcium 8.4 (*)    Albumin 2.9 (*)    Total Bilirubin 1.4 (*)    All other components within normal limits  URINE CULTURE    EKG None  Radiology CT ABDOMEN PELVIS W CONTRAST  Result Date: 11/29/2022 CLINICAL DATA:  Abdominal pain, acute, nonlocalized. Altered mental status. EXAM: CT ABDOMEN AND PELVIS WITH CONTRAST TECHNIQUE: Multidetector CT imaging of the abdomen and pelvis was performed using the standard protocol following bolus administration of intravenous contrast. RADIATION DOSE REDUCTION: This exam was performed according to the departmental dose-optimization program which includes automated exposure control, adjustment of the mA and/or kV according to patient size and/or use of iterative reconstruction technique. CONTRAST:  OMNIPAQUE IOHEXOL 300 MG/ML  SOLN COMPARISON:  CT abdomen/pelvis 10/12/2021. FINDINGS: Lower chest: New small left pleural effusion with adjacent atelectasis in the left lung base. Hepatobiliary: No focal liver lesion. Prior cholecystectomy. Unchanged pneumobilia, suggestive of prior sphincterotomy. Pancreas: Unremarkable. No pancreatic ductal dilatation or surrounding inflammatory changes. Spleen: Normal. Adrenals/Urinary Tract: Adrenal glands are unremarkable. Pelvic left kidney. No hydronephrosis or renal calculi. Bladder is unremarkable. Stomach/Bowel: Normal stomach  and duodenum. No dilated loops of small bowel. Normal appendix is visualized on axial image 48 series 2. Extensive sigmoid diverticulosis without surrounding inflammation to suggest acute diverticulitis. Vascular/Lymphatic: Aortic atherosclerosis. No enlarged abdominal or pelvic lymph nodes. Reproductive: Uterus and bilateral adnexa are unremarkable. Other: No abdominal wall hernia or abnormality. No abdominopelvic ascites. Musculoskeletal: No acute bone findings. Unchanged severe osteoarthritis of the bilateral hip joints. IMPRESSION: 1. No acute abdominopelvic abnormality. 2. New small left pleural effusion with adjacent atelectasis in the left lung base. 3. Extensive sigmoid diverticulosis without evidence of acute diverticulitis. Aortic Atherosclerosis (ICD10-I70.0). Electronically Signed   By: Orvan Falconer M.D.   On: 11/29/2022 14:36    Procedures Procedures    Medications Ordered in ED Medications  cefTRIAXone (ROCEPHIN) 1 g in  sodium chloride 0.9 % 100 mL IVPB (has no administration in time range)  iohexol (OMNIPAQUE) 300 MG/ML solution 100 mL (100 mLs Intravenous Contrast Given 11/29/22 1241)    ED Course/ Medical Decision Making/ A&P Clinical Course as of 11/29/22 1449  Thu Nov 29, 2022  1240 CBC reviewed and interpreted with mild thrombocytopenia 148 and mild anemia 11.8 these appear stable from first prior Complete metabolic panel reviewed interpreted and mildly elevated bili and calcium otherwise within normal limits [DR]  1413 Urinalysis reviewed interpreted significant for greater than 50 white blood cells with many bacteria consistent with urinary tract infection Complete metabolic panel reviewed interpreted with mild hypocalcemia and hypoalbuminemia and total bili up at 1.4 otherwise electrolytes and renal function are normal  [DR]  1447 CT abdomen and pelvis without acute abnormality with some new small left pleural effusion diverticulosis without evidence of diverticulitis  [DR]    Clinical Course User Index [DR] Margarita Grizzle, MD                             Medical Decision Making Amount and/or Complexity of Data Reviewed Labs: ordered. Radiology: ordered.  Risk Prescription drug management.   89 year old female presents today from facility with reports that she has had some change from baseline mental status.  She is awake and alert and interactive.  Evaluated here with labs.  Lab works essentially stable.  Urine is consistent with UTI.  She is given Rocephin.  She did have abdominal pain on her exam and CT is obtained.        Final Clinical Impression(s) / ED Diagnoses Final diagnoses:  Altered mental status, unspecified altered mental status type  Urinary tract infection with hematuria, site unspecified    Rx / DC Orders ED Discharge Orders          Ordered    cephALEXin (KEFLEX) 500 MG capsule  4 times daily        11/29/22 1449              Margarita Grizzle, MD 11/29/22 [phone]

## 2022-11-29 NOTE — ED Notes (Signed)
In to check on pt and she had pulled the iv out. Bleeding controlled.

## 2022-12-03 ENCOUNTER — Telehealth (HOSPITAL_BASED_OUTPATIENT_CLINIC_OR_DEPARTMENT_OTHER): Payer: Self-pay | Admitting: *Deleted

## 2022-12-03 NOTE — Telephone Encounter (Signed)
Post ED Visit - Positive Culture Follow-up  Culture report reviewed by antimicrobial stewardship pharmacist: [facility] []  Enzo Bi, Pharm.D. []  Celedonio Miyamoto, Pharm.D., BCPS AQ-ID []  Garvin Fila, Pharm.D., BCPS []  Georgina Pillion, Pharm.D., BCPS []  [place], [address].D., BCPS, AAHIVP []  Estella Husk, Pharm.D., BCPS, AAHIVP [x]  Lysle Pearl, PharmD, BCPS []  Phillips Climes, PharmD, BCPS []  Agapito Games, PharmD, BCPS []  Verlan Friends, PharmD []  Mervyn Gay, PharmD, BCPS []  Vinnie Level, PharmD  [facility] []  Len Childs, PharmD []  Greer Pickerel, PharmD []  Adalberto Cole, PharmD []  Perlie Gold, Rph []  Lonell Face) Jean Rosenthal, PharmD []  Earl Many, PharmD []  Junita Push, PharmD []  Dorna Leitz, PharmD []  Terrilee Files, PharmD []  Lynann Beaver, PharmD []  Keturah Barre, PharmD []  Loralee Pacas, PharmD []  Bernadene Person, PharmD   Positive urine culture Treated with Cephalexin, organism sensitive to the same and no further patient follow-up is required at this time.  Virl [facility] 12/03/2022, 9:45 AM

## 2023-01-03 ENCOUNTER — Ambulatory Visit: Payer: Medicare Other | Attending: Internal Medicine | Admitting: Internal Medicine

## 2023-01-23 ENCOUNTER — Other Ambulatory Visit: Payer: Self-pay | Admitting: Internal Medicine

## 2023-01-24 ENCOUNTER — Other Ambulatory Visit: Payer: Self-pay | Admitting: Internal Medicine

## 2023-01-24 DIAGNOSIS — I10 Essential (primary) hypertension: Secondary | ICD-10-CM

## 2023-01-30 ENCOUNTER — Encounter (HOSPITAL_COMMUNITY): Payer: Self-pay

## 2023-01-30 ENCOUNTER — Other Ambulatory Visit: Payer: Self-pay

## 2023-01-30 ENCOUNTER — Emergency Department (HOSPITAL_COMMUNITY): Payer: Medicare Other

## 2023-01-30 ENCOUNTER — Emergency Department (HOSPITAL_COMMUNITY)
Admission: EM | Admit: 2023-01-30 | Discharge: 2023-01-30 | Disposition: A | Discharge status: Home / Self Care | Payer: Medicare Other | Attending: Emergency Medicine | Admitting: Emergency Medicine

## 2023-01-30 DIAGNOSIS — S0093XA Contusion of unspecified part of head, initial encounter: Secondary | ICD-10-CM | POA: Insufficient documentation

## 2023-01-30 DIAGNOSIS — Z7901 Long term (current) use of anticoagulants: Secondary | ICD-10-CM | POA: Insufficient documentation

## 2023-01-30 DIAGNOSIS — W0110XA Fall on same level from slipping, tripping and stumbling with subsequent striking against unspecified object, initial encounter: Secondary | ICD-10-CM | POA: Diagnosis not present

## 2023-01-30 DIAGNOSIS — W19XXXA Unspecified fall, initial encounter: Secondary | ICD-10-CM

## 2023-01-30 DIAGNOSIS — S0990XA Unspecified injury of head, initial encounter: Secondary | ICD-10-CM | POA: Diagnosis present

## 2023-01-30 MED ORDER — ACETAMINOPHEN 325 MG PO TABS: 650.0000 mg | ORAL_TABLET | Freq: Once | ORAL | Status: DC

## 2023-01-30 NOTE — ED Triage Notes (Signed)
Pt brought in by [facility] from [facility] for a fall and hitting head, did not LOC but takes blood thinner (Eliquis). No obvious bleeding to head at this time.

## 2023-01-30 NOTE — Discharge Instructions (Signed)
It was our pleasure to provide your ER care today - we hope that you feel better.  Fall precautions.   Follow up with primary care doctor in the next 1-2  weeks.   Return to ER if worse, new symptoms, new/severe pain, chest pain, trouble breathing, or other concern.

## 2023-01-30 NOTE — ED Provider Notes (Signed)
[place] EMERGENCY DEPARTMENT AT [facility] Provider Note   CSN: [id number] Arrival date & time: 01/30/23  1558     History  Chief Complaint  Patient presents with   [name] is a 89 y.o. female.  Pt s/p fall at SNF, on eliquis. ?hit head. Denies loc. Denies headache. No neck or back pain. Denies chest pain or sob. No abd pain or nv. Denies extremity pain or injury. Denies feeling faint or dizzy prior to fall, indicates felt fine today.   The history is provided by the patient, medical records and the EMS personnel. The history is limited by the condition of the patient.  Fall Pertinent negatives include no chest pain, no abdominal pain, no headaches and no shortness of breath.       Home Medications Prior to Admission medications   Medication Sig Start Date End Date Taking? Authorizing Provider  allopurinol (ZYLOPRIM) 300 MG tablet Take 1 tablet (300 mg total) by mouth daily. 07/29/19   Wurst, [place], PA-C  apixaban (ELIQUIS) 5 MG TABS tablet TAKE (1) TABLET BY MOUTH TWICE DAILY. 10/11/22   Marinus Maw, MD  benzonatate (TESSALON) 100 MG capsule Take 100 mg by mouth daily as needed for cough.    [provider]  cephALEXin (KEFLEX) 500 MG capsule Take 1 capsule (500 mg total) by mouth 4 (four) times daily. 11/29/22   Margarita Grizzle, MD  D-Mannose 500 MG CAPS 2 tabs twice daily Patient taking differently: Take 1 capsule by mouth 2 (two) times daily. 01/25/21   Stoioff, Verna Czech, MD  digoxin (LANOXIN) 0.125 MG tablet TAKE (1/2) TABLET BY MOUTH ONCE DAILY. 01/23/23   Marinus Maw, MD  donepezil (ARICEPT) 10 MG tablet Take 10 mg by mouth daily. 11/16/22   [provider]  fluticasone (FLONASE) 50 MCG/ACT nasal spray Place 1 spray into both nostrils daily.    [provider]  metoprolol succinate (TOPROL-XL) 100 MG 24 hr tablet TAKE (1) TABLET BY MOUTH ONCE DAILY. TAKE WITH A MEAL. 01/24/23   Marinus Maw, MD   nitrofurantoin (MACRODANTIN) 100 MG capsule Take 100 mg by mouth daily. 11/16/22   [provider]  vitamin C (ASCORBIC ACID) 500 MG tablet Take 500 mg by mouth daily.    [provider]      Allergies    Bee venom and Codeine    Review of Systems   Review of Systems  Constitutional:  Negative for chills and fever.  HENT:  Negative for nosebleeds.   Eyes:  Negative for redness and visual disturbance.  Respiratory:  Negative for cough and shortness of breath.   Cardiovascular:  Negative for chest pain.  Gastrointestinal:  Negative for abdominal pain, blood in stool, diarrhea and vomiting.  Genitourinary:  Negative for dysuria, flank pain and hematuria.  Musculoskeletal:  Negative for back pain and neck pain.  Skin:  Negative for wound.  Neurological:  Negative for weakness, light-headedness, numbness and headaches.    Physical Exam Updated Vital Signs BP 123/68   Pulse 60   Temp 98 F (36.7 C) (Oral)   Resp 18   Ht 1.676 m (5\' 6" )   Wt 65 kg   SpO2 96%   BMI 23.13 kg/m  Physical Exam Vitals and nursing note reviewed.  Constitutional:      Appearance: Normal appearance. She is well-developed.  HENT:     Head: Atraumatic.     Nose: Nose normal.  Mouth/Throat:     Mouth: Mucous membranes are moist.  Eyes:     General: No scleral icterus.    Conjunctiva/sclera: Conjunctivae normal.     Pupils: Pupils are equal, round, and reactive to light.  Neck:     Trachea: No tracheal deviation.  Cardiovascular:     Rate and Rhythm: Normal rate and regular rhythm.     Pulses: Normal pulses.     Heart sounds: Normal heart sounds. No murmur heard.    No friction rub. No gallop.  Pulmonary:     Effort: Pulmonary effort is normal. No respiratory distress.     Breath sounds: Normal breath sounds.  Chest:     Chest wall: No tenderness.  Abdominal:     General: Bowel sounds are normal. There is no distension.     Palpations: Abdomen is soft.     Tenderness:  There is no abdominal tenderness.  Genitourinary:    Comments: No cva tenderness.  Musculoskeletal:        General: No swelling.     Cervical back: Normal range of motion and neck supple. No rigidity. No muscular tenderness.     Comments: CTLS spine, non tender, aligned, no step off. ?mild pain w rom left hip, no gross deformity or shortening noted. Otherwise good rom bil extremities without pain or other focal bony tenderness. Distal pulses palp bi..   Skin:    General: Skin is warm and dry.     Findings: No rash.  Neurological:     Mental Status: She is alert.     Comments: Alert, speech normal.  GCS15. Motor/sens grossly intact bil.   Psychiatric:        Mood and Affect: Mood normal.     ED Results / Procedures / Treatments   Labs (all labs ordered are listed, but only abnormal results are displayed) Labs Reviewed - No data to display  EKG None  Radiology DG HIP UNILAT W OR W/O PELVIS 2-3 VIEWS LEFT  Result Date: 01/30/2023 CLINICAL DATA:  Fall, pain EXAM: DG HIP (WITH OR WITHOUT PELVIS) 2-3V LEFT COMPARISON:  CT abdomen and pelvis 11/29/2022. FINDINGS: The bones are osteopenic. There severe degenerative changes of both hips with bone-on-bone configuration, subchondral sclerosis, cystic change and osteophyte formation. No definite acute fracture or dislocation. Soft tissues are within normal limits. IMPRESSION: Severe degenerative changes of both hips. No definite acute fracture or dislocation. Electronically Signed   By: Darliss Cheney M.D.   On: 01/30/2023 19:04   CT Head Wo Contrast  Result Date: 01/30/2023 CLINICAL DATA:  Head trauma EXAM: CT HEAD WITHOUT CONTRAST TECHNIQUE: Contiguous axial images were obtained from the base of the skull through the vertex without intravenous contrast. RADIATION DOSE REDUCTION: This exam was performed according to the departmental dose-optimization program which includes automated exposure control, adjustment of the mA and/or kV according to  patient size and/or use of iterative reconstruction technique. COMPARISON:  Head CT 07/28/2021 FINDINGS: Brain: No evidence of acute infarction, hemorrhage, hydrocephalus, extra-axial collection or mass lesion/mass effect. 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 There is mild diffuse atrophy and mild periventricular white matter hypodensity, unchanged. Aneurysm clip in the left supraclinoid region is unchanged. Vascular: Atherosclerotic calcifications are present within the cavernous internal carotid arteries. Skull: No acute fracture. Left frontotemporal craniotomy changes are again noted. Sinuses/Orbits: There is a small air-fluid level in the right maxillary sinus. No acute orbital abnormality. Other: None. IMPRESSION: 1. No acute intracranial process. 2. Small air-fluid level in  the right maxillary sinus. Correlate for acute sinusitis. Electronically Signed   By: Darliss Cheney M.D.   On: 01/30/2023 18:27    Procedures Procedures    Medications Ordered in ED Medications  acetaminophen (TYLENOL) tablet 650 mg (has no administration in time range)    ED Course/ Medical Decision Making/ A&P                                 Medical Decision Making Problems Addressed: Accidental fall, initial encounter: acute illness or injury with systemic symptoms that poses a threat to life or bodily functions Contusion of head, initial encounter: acute illness or injury with systemic symptoms that poses a threat to life or bodily functions  Amount and/or Complexity of Data Reviewed Independent Historian: EMS    Details: hx External Data Reviewed: notes. Radiology: ordered and independent interpretation performed. Decision-making details documented in ED Course.  Risk OTC drugs. Decision regarding hospitalization.  Diff dx includes head injury/sdh, contusion, fall, etc. Dispo decision including potential need for admission considered if ct positive - will get imaging and reassess.   Imaging  ordered.  Reviewed nursing notes and prior charts for additional history.   Xrays reviewed/interpreted by me - no fx.   CT reviewed/interpreted by me - no hem.  Acetaminophen po, po fluids.  Pt currently appears comfortable, and stable for d/c.   Rec pcp f/u.         Final Clinical Impression(s) / ED Diagnoses Final diagnoses:  None    Rx / DC Orders ED Discharge Orders     None         Cathren Laine, MD 01/30/23 [phone]

## 2023-01-30 NOTE — ED Notes (Signed)
Norval Morton (caregiver) requests phone call upon patient discharge. She also requests that we arrange transport via EMS upon pt discharge.

## 2023-01-30 NOTE — ED Notes (Signed)
Notified [facility] of patient needing transportation back to [facility].

## 2023-02-19 ENCOUNTER — Encounter: Payer: Self-pay | Admitting: Internal Medicine

## 2023-02-19 ENCOUNTER — Ambulatory Visit: Payer: Medicare Other | Attending: Internal Medicine | Admitting: Internal Medicine

## 2023-02-19 ENCOUNTER — Encounter: Payer: Self-pay | Admitting: *Deleted

## 2023-02-19 VITALS — BP 104/60 | HR 113 | Ht 68.0 in | Wt 137.4 lb

## 2023-02-19 DIAGNOSIS — I4821 Permanent atrial fibrillation: Secondary | ICD-10-CM | POA: Diagnosis not present

## 2023-02-19 NOTE — Progress Notes (Signed)
HPI [name] returns today for followup. She is a pleasant 89 yo woman with a h/o CHB, s/p PPM, 10 years ago. In the interim, she notes no chest pain or sob or edema. She denies falls. She notes that her activity is not quite what it once was. She has developed progressive dementia. He care giver notes that her bp has been running in the 90's. No syncope or falls however.   Allergies  Allergen Reactions   Bee Venom Swelling   Codeine     REACTION: GI distress     Current Outpatient Medications  Medication Sig Dispense Refill   allopurinol (ZYLOPRIM) 300 MG tablet Take 1 tablet (300 mg total) by mouth daily. 30 tablet 0   apixaban (ELIQUIS) 5 MG TABS tablet TAKE (1) TABLET BY MOUTH TWICE DAILY. 60 tablet 5   D-Mannose 500 MG CAPS 2 tabs twice daily (Patient taking differently: Take 1 capsule by mouth 2 (two) times daily.) 120 capsule 5   digoxin (LANOXIN) 0.125 MG tablet TAKE (1/2) TABLET BY MOUTH ONCE DAILY. 15 tablet 0   donepezil (ARICEPT) 10 MG tablet Take 10 mg by mouth daily.     fluticasone (FLONASE) 50 MCG/ACT nasal spray Place 1 spray into both nostrils daily.     metoprolol succinate (TOPROL-XL) 100 MG 24 hr tablet TAKE (1) TABLET BY MOUTH ONCE DAILY. TAKE WITH A MEAL. 30 tablet 0   nitrofurantoin (MACRODANTIN) 100 MG capsule Take 100 mg by mouth daily.     vitamin C (ASCORBIC ACID) 500 MG tablet Take 500 mg by mouth daily.     No current facility-administered medications for this visit.     Past Medical History:  Diagnosis Date   Arthritis    Bilateral chronic knee pain    Brain aneurysm    Brain aneurysm    Brain aneurysm    COPD (chronic obstructive pulmonary disease) (HCC)    DDD (degenerative disc disease), lumbar    Dementia (HCC)    DJD (degenerative joint disease)    DJD (degenerative joint disease)    DVT (deep venous thrombosis) (HCC)    Gout    Hypertension    Peripheral neuropathy    Permanent atrial fibrillation [facility])    MDT [id number]  pacemaker 03-26-2013 by Dr Ladona Ridgel   Pulmonary embolism [facility])    Renal disorder     ROS:   All systems reviewed and negative except as noted in the HPI.   Past Surgical History:  Procedure Laterality Date   CARDIAC CATHETERIZATION  2001   Craniotomy clipping  06-2002   unruptured   Left paraclinoid segment anurysum     [facility]   PACEMAKER INSERTION  09-13-1999; 03-26-2013   PPM gen change by Dr Ladona Ridgel 03-26-2013 - MDT [id number]   PERMANENT PACEMAKER GENERATOR CHANGE N/A 03/26/2013   Procedure: PERMANENT PACEMAKER GENERATOR CHANGE;  Surgeon: Marinus Maw, MD;  Location: [facility] CATH LAB;  Service: Cardiovascular;  Laterality: N/A;     Family History  Problem Relation Age of Onset   Heart attack Mother    Heart attack Father    Alzheimer's disease Sister      Social History   Socioeconomic History   Marital status: Widowed    Spouse name: Not on file   Number of children: Not on file   Years of education: Not on file   Highest education level: Not on file  Occupational History   Occupation: retired from the  telephone company  Tobacco Use   Smoking status: Former    Current packs/day: 0.00    Average packs/day: 0.3 packs/day for 35.0 years (8.8 ttl pk-yrs)    Types: Cigarettes    Start date: 12/27/1977    Quit date: 05/26/2010    Years since quitting: 12.7   Smokeless tobacco: Never  Vaping Use   Vaping status: Not on file  Substance and Sexual Activity   Alcohol use: No    Alcohol/week: 0.0 standard drinks of alcohol   Drug use: No   Sexual activity: Not on file  Other Topics Concern   Not on file  Social History Narrative   Not on file   Social Determinants of Health   Financial Resource Strain: Not on file  Food Insecurity: Not on file  Transportation Needs: Not on file  Physical Activity: Not on file  Stress: Not on file  Social Connections: Not on file  Intimate Partner Violence: Not on file     BP 104/60   Pulse (!) 113    Ht 5\' 8"  (1.727 m)   Wt 137 lb 6.4 oz (62.3 kg)   SpO2 100%   BMI 20.89 kg/m   Physical Exam:  Well appearing NAD HEENT: Unremarkable Neck:  No JVD, no thyromegally Lymphatics:  No adenopathy Back:  No CVA tenderness Lungs:  Clear HEART:  Regular rate rhythm, no murmurs, no rubs, no clicks Abd:  soft, positive bowel sounds, no organomegally, no rebound, no guarding Ext:  2 plus pulses, no edema, no cyanosis, no clubbing Skin:  No rashes no nodules Neuro:  CN II through XII intact, motor grossly intact  DEVICE  Normal device function.  See PaceArt for details. 1 month from ERI  Assess/Plan:  HTN - she has a h/o HTN but her pressures are currently controlled. She will continue cardizem. Atrial fib - her VR is well controlled. Hopefully her rates wont increase with cessation of cardizem.  Coags - she has not had any bleeding on eliquis. PPM - her medtronic single chamber PPM is working normally. She has less than a month of battery longevity. We will schedule PPM gen change.   Tracy Gowda Robertlee Rogacki,MD

## 2023-02-19 NOTE — Patient Instructions (Addendum)
Medication Instructions:  Your physician recommends that you continue on your current medications as directed. Please refer to the Current Medication list given to you today.   *If you need a refill on your cardiac medications before your next appointment, please call your pharmacy*   Lab Work: Your physician recommends that you return for lab work in: Monday, May 20, 2023 ( At [facility])     If you have labs (blood work) drawn today and your tests are completely normal, you will receive your results only by: MyChart Message (if you have MyChart) OR A paper copy in the mail If you have any lab test that is abnormal or we need to change your treatment, we will call you to review the results.   Testing/Procedures: NONE    Follow-Up: At [facility], you and your health needs are our priority.  As part of our continuing mission to provide you with exceptional heart care, we have created designated Provider Care Teams.  These Care Teams include your primary Cardiologist (physician) and Advanced Practice Providers (APPs -  Physician Assistants and Nurse Practitioners) who all work together to provide you with the care you need, when you need it.  We recommend signing up for the patient portal called "MyChart".  Sign up information is provided on this After Visit Summary.  MyChart is used to connect with patients for Virtual Visits (Telemedicine).  Patients are able to view lab/test results, encounter notes, upcoming appointments, etc.  Non-urgent messages can be sent to your provider as well.   To learn more about what you can do with MyChart, go to [email or web address].    Your next appointment:   6 month(s)  Provider:   Lewayne Bunting, MD    Other Instructions Thank you for choosing [facility]!      Implantable Device Instructions    GLADIES SOFRANKO  02/19/2023  You are scheduled for a Permanent transvenous pacemaker (PPM) on Thursday,  January 16 with Dr. Lewayne Bunting.  1. Pre procedure Lab testing:  Monday, May 20, 2023     2. Please arrive at the Main Entrance A at [facility]: [address] [place], [place] [address] on January 16 at 9:30 AM (This time is two hours before your procedure to ensure your preparation). Free valet parking service is available. You will check in at ADMITTING. The support person will be asked to wait in the waiting room.  It is OK to have someone drop you off and come back when you are ready to be discharged.        Special note: Every effort is made to have your procedure done on time. Please understand that emergencies sometimes delay  scheduled procedures.  3.  No eating or drinking after midnight prior to procedure.     4.  Medication instructions:  On the morning of your procedure hold your Eliquis (Apixaban) for 2 day(s) prior to your procedure. Your last dose will be Monday, January 13, PM dose.   5.  The night before your procedure and the morning of your procedure scrub your neck/chest with CHG surgical scrub.  See instruction letter.  6. Plan to go home the same day, you will only stay overnight if medically necessary. 7.  You MUST have a responsible adult to drive you home. 8.   An adult MUST be with you the first 24 hours after you arrive home. 9..  Bring a current list of your medications, and  the last time and date medication taken. 10. Bring ID and current insurance cards. 11. .Please wear clothes that are easy to get on and off and wear slip-on shoes.    You will follow up with the [facility] Device clinic 10-14 days after your procedure.  You will follow up with Dr. Lewayne Bunting 91 days after your procedure.  These appointments will be made for you.   * If you have ANY questions after you get home, please call the office at [phone] or send a MyChart message.  FYI: For your safety, and to allow [place] to monitor your vital signs accurately during the  surgery/procedure we request that if you have artificial nails, gel coating, SNS etc. Please have those removed prior to your surgery/procedure. Not having the nail coverings /polish removed may result in cancellation or delay of your surgery/procedure.    [facility] - Preparing For Surgery    Before surgery, you can play an important role. Because skin is not sterile, your skin needs to be as free of germs as possible. You can reduce the number of germs on your skin by washing with CHG (chlorahexidine gluconate) Soap before surgery.  CHG is an antiseptic cleaner which kills germs and bonds with the skin to continue killing germs even after washing.  Please do not use if you have an allergy to CHG or antibacterial soaps.  If your skin becomes reddened/irritated stop using the CHG.   Do not shave (including legs and underarms) for at least 48 hours prior to first CHG shower.  It is OK to shave your face.  Please follow these instructions carefully:  1.  Shower the night before surgery and the morning of surgery with CHG.  2.  If you choose to wash your hair, wash your hair first as usual with your normal shampoo.  3.  After you shampoo, rinse your hair and body thoroughly to remove the shampoo.  4.  Use CHG as you would any other liquid soap.  You can apply CHG directly to the skin and wash gently with a clean washcloth. 5.  Apply the CHG Soap to your body ONLY FROM THE NECK DOWN.  Do not use on open wounds or open sores.  Avoid contact with your eyes, ears, mouth and genitals (private parts).  Wash genitals (private parts) with your normal soap.  6.  Wash thoroughly, paying special attention to the area where your surgery will be performed.  7.  Thoroughly rinse your body with warm water from the neck down.   8.  DO NOT shower/wash with your normal soap after using and rinsing off the CHG soap.  9.  Pat yourself dry with a clean towel.           10.  Wear clean pajamas.           11.  Place  clean sheets on your bed the night of your first shower and do not sleep with pets.  Day of Surgery: Do not apply any deodorants/lotions.  Please wear clean clothes to the hospital/surgery center.

## 2023-02-19 NOTE — Addendum Note (Signed)
Addended by: Kerney Elbe on: 02/19/2023 10:30 AM   Modules accepted: Orders

## 2023-02-20 ENCOUNTER — Telehealth: Payer: Self-pay | Admitting: *Deleted

## 2023-02-20 LAB — CUP PACEART INCLINIC DEVICE CHECK
Date Time Interrogation Session: 20241015111518
Implantable Lead Connection Status: 753985
Implantable Lead Implant Date: 20010509
Implantable Lead Location: 753860
Implantable Lead Model: 5076
Implantable Pulse Generator Implant Date: 20141120

## 2023-02-20 NOTE — Telephone Encounter (Signed)
Spoke with pt caretaker VERNELL SIMPSON. Informed that pt is scheduled for PPM Gen change on 05/23/23. Pt instructions mailed to pt home.

## 2023-02-21 ENCOUNTER — Other Ambulatory Visit: Payer: Self-pay | Admitting: Internal Medicine

## 2023-03-04 ENCOUNTER — Other Ambulatory Visit: Payer: Self-pay

## 2023-03-04 ENCOUNTER — Emergency Department (HOSPITAL_COMMUNITY)
Admission: EM | Admit: 2023-03-04 | Discharge: 2023-03-04 | Disposition: A | Discharge status: Other Acute Inpatient Hospital | Payer: Medicare Other | Attending: Emergency Medicine | Admitting: Emergency Medicine

## 2023-03-04 ENCOUNTER — Encounter (HOSPITAL_COMMUNITY): Payer: Self-pay

## 2023-03-04 ENCOUNTER — Emergency Department (HOSPITAL_COMMUNITY): Payer: Medicare Other

## 2023-03-04 DIAGNOSIS — J449 Chronic obstructive pulmonary disease, unspecified: Secondary | ICD-10-CM | POA: Insufficient documentation

## 2023-03-04 DIAGNOSIS — U071 COVID-19: Secondary | ICD-10-CM | POA: Insufficient documentation

## 2023-03-04 DIAGNOSIS — Z7901 Long term (current) use of anticoagulants: Secondary | ICD-10-CM | POA: Insufficient documentation

## 2023-03-04 DIAGNOSIS — M791 Myalgia, unspecified site: Secondary | ICD-10-CM | POA: Diagnosis present

## 2023-03-04 DIAGNOSIS — R52 Pain, unspecified: Secondary | ICD-10-CM

## 2023-03-04 DIAGNOSIS — I10 Essential (primary) hypertension: Secondary | ICD-10-CM | POA: Diagnosis not present

## 2023-03-04 LAB — CBC WITH DIFFERENTIAL/PLATELET
Abs Immature Granulocytes: 0.05 10*3/uL (ref 0.00–0.07)
Basophils Absolute: 0 10*3/uL (ref 0.0–0.1)
Basophils Relative: 0 %
Eosinophils Absolute: 0 10*3/uL (ref 0.0–0.5)
Eosinophils Relative: 0 %
HCT: 39.1 % (ref 36.0–46.0)
Hemoglobin: 12.6 g/dL (ref 12.0–15.0)
Immature Granulocytes: 1 %
Lymphocytes Relative: 23 %
Lymphs Abs: 1.7 10*3/uL (ref 0.7–4.0)
MCH: 33.5 pg (ref 26.0–34.0)
MCHC: 32.2 g/dL (ref 30.0–36.0)
MCV: 104 fL — ABNORMAL HIGH (ref 80.0–100.0)
Monocytes Absolute: 0.6 10*3/uL (ref 0.1–1.0)
Monocytes Relative: 9 %
Neutro Abs: 4.8 10*3/uL (ref 1.7–7.7)
Neutrophils Relative %: 67 %
Platelets: 154 10*3/uL (ref 150–400)
RBC: 3.76 MIL/uL — ABNORMAL LOW (ref 3.87–5.11)
RDW: 14.6 % (ref 11.5–15.5)
WBC: 7.1 10*3/uL (ref 4.0–10.5)
nRBC: 0 % (ref 0.0–0.2)

## 2023-03-04 LAB — COMPREHENSIVE METABOLIC PANEL
ALT: 15 U/L (ref 0–44)
AST: 22 U/L (ref 15–41)
Albumin: 2.9 g/dL — ABNORMAL LOW (ref 3.5–5.0)
Alkaline Phosphatase: 66 U/L (ref 38–126)
Anion gap: 9 (ref 5–15)
BUN: 13 mg/dL (ref 8–23)
CO2: 27 mmol/L (ref 22–32)
Calcium: 8.6 mg/dL — ABNORMAL LOW (ref 8.9–10.3)
Chloride: 103 mmol/L (ref 98–111)
Creatinine, Ser: 0.69 mg/dL (ref 0.44–1.00)
GFR, Estimated: >60 mL/min (ref >60)
Glucose, Bld: 74 mg/dL (ref 70–99)
Potassium: 3.7 mmol/L (ref 3.5–5.1)
Sodium: 139 mmol/L (ref 135–145)
Total Bilirubin: 1 mg/dL (ref 0.3–1.2)
Total Protein: 7.2 g/dL (ref 6.5–8.1)

## 2023-03-04 MED ORDER — MOLNUPIRAVIR EUA 200MG CAPSULE: 4.0000 | ORAL_CAPSULE | Freq: Two times a day (BID) | ORAL | 0 refills | Status: DC

## 2023-03-04 MED ORDER — ACETAMINOPHEN 325 MG PO TABS: 650.0000 mg | ORAL_TABLET | Freq: Four times a day (QID) | ORAL | 0 refills | Status: AC | PRN

## 2023-03-04 MED ORDER — ALBUTEROL SULFATE HFA 108 (90 BASE) MCG/ACT IN AERS
1.0000 | INHALATION_SPRAY | Freq: Four times a day (QID) | RESPIRATORY_TRACT | 0 refills | Status: AC | PRN
Start: 2023-03-04 — End: ?

## 2023-03-04 NOTE — ED Notes (Addendum)
Pt in bed, pt has no complaints at this time, pt eat all food provided, resps even and unlabored.  Pt confused.

## 2023-03-04 NOTE — ED Provider Notes (Signed)
[place] EMERGENCY DEPARTMENT AT [facility] Provider Note  CSN: [id number] Arrival date & time: 03/04/23 1054  Chief Complaint(s) Generalized Body Aches  HPI [name] is a 89 y.o. female with past medical history as below, significant for arthritis, HTN, afib, dementia at SNF, COPD who presents to the ED with complaint of body aches.   She is recent diagnosed with COVID-19, she is having bodyaches.  No respiratory complaints.  No cough or fevers, no abdominal pain nausea or vomiting.  Tolerant p.o. tolerated without difficulty.  She is on Paxlovid and tolerating well.  Reports she feels okay overall.  COVID is diagnosed around 3 days ago.  Past Medical History Past Medical History:  Diagnosis Date   Arthritis    Bilateral chronic knee pain    Brain aneurysm    Brain aneurysm    Brain aneurysm    COPD (chronic obstructive pulmonary disease) (HCC)    DDD (degenerative disc disease), lumbar    Dementia (HCC)    DJD (degenerative joint disease)    DJD (degenerative joint disease)    DVT (deep venous thrombosis) (HCC)    Gout    Hypertension    Peripheral neuropathy    Permanent atrial fibrillation (HCC)    MDT [id number] pacemaker 03-26-2013 by Dr Ladona Ridgel   Pulmonary embolism [facility])    Renal disorder    Patient Active Problem List   Diagnosis Date Noted   Hemorrhoids 10/06/2019   At high risk for injury related to fall 08/27/2019   Keratotic lesion 07/22/2019   Hypokalemia 12/05/2016   Dementia with behavioral disturbance (HCC) 04/25/2016   Noncompliance with medication regimen 04/24/2016   UTI (urinary tract infection) 04/18/2016   Chest pain 02/10/2014   Edema 02/10/2014   Encounter for therapeutic drug monitoring 08/13/2013   Chronic anticoagulation 07/22/2010   HYPOTENSION, ORTHOSTATIC 06/15/2010   Essential hypertension, benign 04/06/2009   Cardiac pacemaker in situ 04/06/2009   PULMONARY EMBOLISM 01/18/2009   ATRIAL FIBRILLATION, CHRONIC  01/18/2009   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 Authorizing Provider  acetaminophen (TYLENOL) 325 MG tablet Take 2 tablets (650 mg total) by mouth every 6 (six) hours as needed. 03/04/23  Yes Tanda Rockers A, DO  albuterol (VENTOLIN HFA) 108 (90 Base) MCG/ACT inhaler Inhale 1-2 puffs into the lungs every 6 (six) hours as needed for wheezing or shortness of breath. 03/04/23  Yes Tanda Rockers A, DO  allopurinol (ZYLOPRIM) 300 MG tablet Take 1 tablet (300 mg total) by mouth daily. 07/29/19   Wurst, [place], PA-C  apixaban (ELIQUIS) 5 MG TABS tablet TAKE (1) TABLET BY MOUTH TWICE DAILY. 10/11/22   Marinus Maw, MD  D-Mannose 500 MG CAPS 2 tabs twice daily Patient taking differently: Take 1 capsule by mouth 2 (two) times daily. 01/25/21   Stoioff, Verna Czech, MD  digoxin (LANOXIN) 0.125 MG tablet TAKE (1/2) TABLET BY MOUTH ONCE DAILY. 02/21/23   Marinus Maw, MD  donepezil (ARICEPT) 10 MG tablet Take 10 mg by mouth daily. 11/16/22   [provider]  fluticasone (FLONASE) 50 MCG/ACT nasal spray Place 1 spray into both nostrils daily.    [provider]  metoprolol succinate (TOPROL-XL) 100 MG 24 hr tablet TAKE (1) TABLET BY MOUTH ONCE DAILY. TAKE WITH A MEAL. 01/24/23   Marinus Maw, MD  nitrofurantoin (MACRODANTIN) 100 MG capsule Take 100 mg by mouth daily.  11/16/22   [provider]  vitamin C (ASCORBIC ACID) 500 MG tablet Take 500 mg by mouth daily.    [provider]                                                                                                                                    Past Surgical History Past Surgical History:  Procedure Laterality Date   CARDIAC CATHETERIZATION  2001   Craniotomy clipping  06-2002   unruptured   Left paraclinoid segment anurysum     [facility]   PACEMAKER INSERTION   09-13-1999; 03-26-2013   PPM gen change by Dr Ladona Ridgel 03-26-2013 - MDT [id number]   PERMANENT PACEMAKER GENERATOR CHANGE N/A 03/26/2013   Procedure: PERMANENT PACEMAKER GENERATOR CHANGE;  Surgeon: Marinus Maw, MD;  Location: [facility] CATH LAB;  Service: Cardiovascular;  Laterality: N/A;   Family History Family History  Problem Relation Age of Onset   Heart attack Mother    Heart attack Father    Alzheimer's disease Sister     Social History Social History   Tobacco Use   Smoking status: Former    Current packs/day: 0.00    Average packs/day: 0.3 packs/day for 35.0 years (8.8 ttl pk-yrs)    Types: Cigarettes    Start date: 12/27/1977    Quit date: 05/26/2010    Years since quitting: 12.7   Smokeless tobacco: Never  Substance Use Topics   Alcohol use: No    Alcohol/week: 0.0 standard drinks of alcohol   Drug use: No   Allergies Bee venom and Codeine  Review of Systems Review of Systems  Constitutional:  Negative for chills and fever.  Respiratory:  Negative for chest tightness and shortness of breath.   Cardiovascular:  Negative for chest pain.  Gastrointestinal:  Negative for abdominal pain, nausea and vomiting.  Genitourinary:  Negative for dysuria.  Musculoskeletal:  Positive for arthralgias.  Skin:  Negative for wound.  All other systems reviewed and are negative.   Physical Exam Vital Signs  I have reviewed the triage vital signs BP (!) 137/96 (BP Location: Right Arm)   Pulse 69   Temp 97.8 F (36.6 C) (Oral)   Resp 17   Ht 5\' 8"  (1.727 m)   Wt 61.2 kg   SpO2 96%   BMI 20.53 kg/m  Physical Exam Vitals and nursing note reviewed.  Constitutional:      General: She is not in acute distress.    Appearance: Normal appearance. She is well-developed. She is not ill-appearing.  HENT:     Head: Normocephalic and atraumatic.     Right Ear: External ear normal.     Left Ear: External ear normal.     Nose: Nose normal.     Mouth/Throat:     Mouth: Mucous membranes are  moist.  Eyes:     General: No scleral icterus.  Right eye: No discharge.        Left eye: No discharge.  Cardiovascular:     Rate and Rhythm: Normal rate and regular rhythm.  Pulmonary:     Effort: Pulmonary effort is normal. No respiratory distress.     Breath sounds: No stridor.  Abdominal:     General: Abdomen is flat. There is no distension.     Palpations: Abdomen is soft.     Tenderness: There is no guarding.  Musculoskeletal:        General: No deformity.     Cervical back: No rigidity.  Skin:    General: Skin is warm and dry.     Coloration: Skin is not cyanotic, jaundiced or pale.  Neurological:     Mental Status: She is alert and oriented to person, place, and time.     GCS: GCS eye subscore is 4. GCS verbal subscore is 5. GCS motor subscore is 6.  Psychiatric:        Speech: Speech normal.        Behavior: Behavior normal. Behavior is cooperative.     ED Results and Treatments Labs (all labs ordered are listed, but only abnormal results are displayed) Labs Reviewed  CBC WITH DIFFERENTIAL/PLATELET - Abnormal; Notable for the following components:      Result Value   RBC 3.76 (*)    MCV 104.0 (*)    All other components within normal limits  COMPREHENSIVE METABOLIC PANEL - Abnormal; Notable for the following components:   Calcium 8.6 (*)    Albumin 2.9 (*)    All other components within normal limits                                                                                                                          Radiology DG Chest Portable 1 View  Result Date: 03/04/2023 CLINICAL DATA:  COVID infection EXAM: PORTABLE CHEST 1 VIEW COMPARISON:  Chest radiograph dated 07/28/2021 FINDINGS: Left chest wall pacemaker leads project over the right atrium and ventricle. Normal lung volumes. Bibasilar patchy and interstitial opacities, right-greater-than-left. No pleural effusion or pneumothorax. Similar mildly enlarged cardiomediastinal silhouette. No acute  osseous abnormality. IMPRESSION: Bibasilar patchy and interstitial opacities, right-greater-than-left, likely pneumonia in the setting of known COVID infection. Electronically Signed   By: Agustin Cree M.D.   On: 03/04/2023 13:45    Pertinent labs & imaging results that were available during my care of the patient were reviewed by me and considered in my medical decision making (see MDM for details).  Medications Ordered in ED Medications - No data to display  Procedures Procedures  (including critical care time)  Medical Decision Making / ED Course    Medical Decision Making:    ZYASIA KRITZER is a 89 y.o. female with past medical history as below, significant for arthritis, HTN, afib, dementia at SNF, COPD who presents to the ED with complaint of body aches. . The complaint involves an extensive differential diagnosis and also carries with it a high risk of complications and morbidity.  Serious etiology was considered. Ddx includes but is not limited to: Viral infection, dehydration, metabolic derangement, etc.  Complete initial physical exam performed, notably the patient  was no acute distress, HDS.    Reviewed and confirmed nursing documentation for past medical history, family history, social history.  Vital signs reviewed.    Clinical Course as of 03/04/23 1612  Mon Mar 04, 2023  1349 CXR concerning for possible pna, she has recent covid 85 dx [SG]    Clinical Course User Index [SG] Sloan Leiter, DO     She Is well-appearing, labs reviewed and are stable.  Chest x-ray consistent with COVID-19.  She is already on Paxlovid.  Encourage supportive care at home.  Symptoms are mild.  Does not require admission at this time.  No hypoxia.   The patient improved significantly and was discharged in stable condition. Detailed discussions were had with  the patient regarding current findings, and need for close f/u with PCP or on call doctor. The patient has been instructed to return immediately if the symptoms worsen in any way for re-evaluation. Patient verbalized understanding and is in agreement with current care plan. All questions answered prior to discharge.                   Additional history obtained: -Additional history obtained from na -External records from outside source obtained and reviewed including: Chart review including previous notes, labs, imaging, consultation notes including  Home medications Primary care documentation    Lab Tests: -I ordered, reviewed, and interpreted labs.   The pertinent results include:   Labs Reviewed  CBC WITH DIFFERENTIAL/PLATELET - Abnormal; Notable for the following components:      Result Value   RBC 3.76 (*)    MCV 104.0 (*)    All other components within normal limits  COMPREHENSIVE METABOLIC PANEL - Abnormal; Notable for the following components:   Calcium 8.6 (*)    Albumin 2.9 (*)    All other components within normal limits    Notable for labs stable  EKG   EKG Interpretation Date/Time:    Ventricular Rate:    PR Interval:    QRS Duration:    QT Interval:    QTC Calculation:   R Axis:      Text Interpretation:           Imaging Studies ordered: I ordered imaging studies including chest x-ray  I independently visualized the following imaging with scope of interpretation limited to determining acute life threatening conditions related to emergency care; findings noted above I independently visualized and interpreted imaging. I agree with the radiologist interpretation   Medicines ordered and prescription drug management: Meds ordered this encounter  Medications   DISCONTD: molnupiravir EUA (LAGEVRIO) 200 mg CAPS capsule    Sig: Take 4 capsules (800 mg total) by mouth 2 (two) times daily for 5 days.    Dispense:  40 capsule    Refill:  0    albuterol (VENTOLIN HFA) 108 (90 Base) MCG/ACT inhaler  Sig: Inhale 1-2 puffs into the lungs every 6 (six) hours as needed for wheezing or shortness of breath.    Dispense:  8.5 g    Refill:  0   acetaminophen (TYLENOL) 325 MG tablet    Sig: Take 2 tablets (650 mg total) by mouth every 6 (six) hours as needed.    Dispense:  36 tablet    Refill:  0    -I have reviewed the patients home medicines and have made adjustments as needed   Consultations Obtained: na   Cardiac Monitoring: The patient was maintained on a cardiac monitor.  I personally viewed and interpreted the cardiac monitored which showed an underlying rhythm of: NSR Continuous pulse oximetry interpreted by myself, 99% on RA.    Social Determinants of Health:  Diagnosis or treatment significantly limited by social determinants of health: former smoker   Reevaluation: After the interventions noted above, I reevaluated the patient and found that they have improved  Co morbidities that complicate the patient evaluation  Past Medical History:  Diagnosis Date   Arthritis    Bilateral chronic knee pain    Brain aneurysm    Brain aneurysm    Brain aneurysm    COPD (chronic obstructive pulmonary disease) (HCC)    DDD (degenerative disc disease), lumbar    Dementia (HCC)    DJD (degenerative joint disease)    DJD (degenerative joint disease)    DVT (deep venous thrombosis) (HCC)    Gout    Hypertension    Peripheral neuropathy    Permanent atrial fibrillation [facility])    MDT [id number] pacemaker 03-26-2013 by Dr Ladona Ridgel   Pulmonary embolism [facility])    Renal disorder       Dispostion: Disposition decision including need for hospitalization was considered, and patient discharged from emergency department.    Final Clinical Impression(s) / ED Diagnoses Final diagnoses:  COVID-19  Body aches        Sloan Leiter, DO 03/04/23 1612

## 2023-03-04 NOTE — ED Notes (Signed)
Report called to care giver, Norval Morton at [phone].  States that she can't come to pick up the pt.  Arrangements being made to get pt back to facility.  Pt in bed, food and juice given.  Pt has no other requests at this time.

## 2023-03-04 NOTE — ED Notes (Signed)
Family member called and wanted an update on pt's transportation status. Explained that we do not have convo and that it will be whenever EMS is able to get her. Family member expressed she will call back in 30 mins to let us know if she is able to get her.

## 2023-03-04 NOTE — ED Notes (Signed)
Pt in bed, pt talking in full sentences, resps even and unlabored, pt has moist productive cough.  Pt oriented to self, re oriented pt.  Pt satting 99% on monitor.

## 2023-03-04 NOTE — Discharge Instructions (Addendum)
It was a pleasure caring for you today in the emergency department. ° °Please return to the emergency department for any worsening or worrisome symptoms. ° ° °

## 2023-03-04 NOTE — ED Triage Notes (Signed)
Pt to er room number 10 via ems, per ems pt was dx with covid about three days ago, states that everyone at the adult home has covid, states that pt is here for general body aches, pt answering questions.

## 2023-03-04 NOTE — ED Notes (Signed)
Family member called stating someone was on the way to pick pt up to take home.

## 2023-03-20 ENCOUNTER — Other Ambulatory Visit: Payer: Self-pay | Admitting: Internal Medicine

## 2023-03-20 DIAGNOSIS — I10 Essential (primary) hypertension: Secondary | ICD-10-CM

## 2023-05-06 ENCOUNTER — Other Ambulatory Visit: Payer: Self-pay

## 2023-05-06 ENCOUNTER — Inpatient Hospital Stay (HOSPITAL_COMMUNITY)
Admission: EM | Admit: 2023-05-06 | Discharge: 2023-05-10 | DRG: 690 | Disposition: A | Discharge status: Skilled Nursing Facility | Payer: Medicare Other | Attending: Family Medicine | Admitting: Family Medicine

## 2023-05-06 ENCOUNTER — Encounter (HOSPITAL_COMMUNITY): Payer: Self-pay

## 2023-05-06 ENCOUNTER — Emergency Department (HOSPITAL_COMMUNITY): Payer: Medicare Other

## 2023-05-06 DIAGNOSIS — Z66 Do not resuscitate: Secondary | ICD-10-CM | POA: Diagnosis present

## 2023-05-06 DIAGNOSIS — R531 Weakness: Secondary | ICD-10-CM | POA: Diagnosis present

## 2023-05-06 DIAGNOSIS — R718 Other abnormality of red blood cells: Secondary | ICD-10-CM

## 2023-05-06 DIAGNOSIS — R001 Bradycardia, unspecified: Secondary | ICD-10-CM | POA: Diagnosis present

## 2023-05-06 DIAGNOSIS — Z888 Allergy status to other drugs, medicaments and biological substances status: Secondary | ICD-10-CM

## 2023-05-06 DIAGNOSIS — F03918 Unspecified dementia, unspecified severity, with other behavioral disturbance: Secondary | ICD-10-CM | POA: Diagnosis present

## 2023-05-06 DIAGNOSIS — Z950 Presence of cardiac pacemaker: Secondary | ICD-10-CM

## 2023-05-06 DIAGNOSIS — J449 Chronic obstructive pulmonary disease, unspecified: Secondary | ICD-10-CM | POA: Diagnosis present

## 2023-05-06 DIAGNOSIS — I10 Essential (primary) hypertension: Secondary | ICD-10-CM | POA: Diagnosis not present

## 2023-05-06 DIAGNOSIS — M1000 Idiopathic gout, unspecified site: Secondary | ICD-10-CM

## 2023-05-06 DIAGNOSIS — E8809 Other disorders of plasma-protein metabolism, not elsewhere classified: Secondary | ICD-10-CM | POA: Diagnosis present

## 2023-05-06 DIAGNOSIS — N3080 Other cystitis without hematuria: Secondary | ICD-10-CM | POA: Diagnosis present

## 2023-05-06 DIAGNOSIS — Z8249 Family history of ischemic heart disease and other diseases of the circulatory system: Secondary | ICD-10-CM

## 2023-05-06 DIAGNOSIS — M109 Gout, unspecified: Secondary | ICD-10-CM | POA: Diagnosis present

## 2023-05-06 DIAGNOSIS — Z885 Allergy status to narcotic agent status: Secondary | ICD-10-CM

## 2023-05-06 DIAGNOSIS — Z7901 Long term (current) use of anticoagulants: Secondary | ICD-10-CM

## 2023-05-06 DIAGNOSIS — E440 Moderate protein-calorie malnutrition: Secondary | ICD-10-CM | POA: Diagnosis present

## 2023-05-06 DIAGNOSIS — E46 Unspecified protein-calorie malnutrition: Secondary | ICD-10-CM | POA: Diagnosis not present

## 2023-05-06 DIAGNOSIS — I495 Sick sinus syndrome: Secondary | ICD-10-CM | POA: Diagnosis present

## 2023-05-06 DIAGNOSIS — M160 Bilateral primary osteoarthritis of hip: Secondary | ICD-10-CM | POA: Diagnosis present

## 2023-05-06 DIAGNOSIS — M25551 Pain in right hip: Secondary | ICD-10-CM | POA: Diagnosis not present

## 2023-05-06 DIAGNOSIS — I5032 Chronic diastolic (congestive) heart failure: Secondary | ICD-10-CM | POA: Diagnosis present

## 2023-05-06 DIAGNOSIS — M25552 Pain in left hip: Secondary | ICD-10-CM

## 2023-05-06 DIAGNOSIS — I482 Chronic atrial fibrillation, unspecified: Secondary | ICD-10-CM | POA: Diagnosis not present

## 2023-05-06 DIAGNOSIS — Z6821 Body mass index (BMI) 21.0-21.9, adult: Secondary | ICD-10-CM | POA: Diagnosis not present

## 2023-05-06 DIAGNOSIS — Z86711 Personal history of pulmonary embolism: Secondary | ICD-10-CM | POA: Diagnosis not present

## 2023-05-06 DIAGNOSIS — I4891 Unspecified atrial fibrillation: Secondary | ICD-10-CM | POA: Diagnosis present

## 2023-05-06 DIAGNOSIS — B9620 Unspecified Escherichia coli [E. coli] as the cause of diseases classified elsewhere: Secondary | ICD-10-CM | POA: Diagnosis present

## 2023-05-06 DIAGNOSIS — Z820 Family history of epilepsy and other diseases of the nervous system: Secondary | ICD-10-CM | POA: Diagnosis not present

## 2023-05-06 DIAGNOSIS — Z87891 Personal history of nicotine dependence: Secondary | ICD-10-CM

## 2023-05-06 DIAGNOSIS — I4821 Permanent atrial fibrillation: Secondary | ICD-10-CM | POA: Diagnosis present

## 2023-05-06 DIAGNOSIS — I110 Hypertensive heart disease with heart failure: Secondary | ICD-10-CM | POA: Diagnosis present

## 2023-05-06 DIAGNOSIS — Z79899 Other long term (current) drug therapy: Secondary | ICD-10-CM

## 2023-05-06 DIAGNOSIS — Z91030 Bee allergy status: Secondary | ICD-10-CM

## 2023-05-06 LAB — COMPREHENSIVE METABOLIC PANEL
ALT: 14 U/L (ref 0–44)
AST: 18 U/L (ref 15–41)
Albumin: 2.8 g/dL — ABNORMAL LOW (ref 3.5–5.0)
Alkaline Phosphatase: 60 U/L (ref 38–126)
Anion gap: 8 (ref 5–15)
BUN: 12 mg/dL (ref 8–23)
CO2: 25 mmol/L (ref 22–32)
Calcium: 8.5 mg/dL — ABNORMAL LOW (ref 8.9–10.3)
Chloride: 104 mmol/L (ref 98–111)
Creatinine, Ser: 0.77 mg/dL (ref 0.44–1.00)
GFR, Estimated: >60 mL/min (ref >60)
Glucose, Bld: 86 mg/dL (ref 70–99)
Potassium: 4 mmol/L (ref 3.5–5.1)
Sodium: 137 mmol/L (ref 135–145)
Total Bilirubin: 1.2 mg/dL (ref 0.0–1.2)
Total Protein: 6.4 g/dL — ABNORMAL LOW (ref 6.5–8.1)

## 2023-05-06 LAB — CBC WITH DIFFERENTIAL/PLATELET
Abs Immature Granulocytes: 0.04 10*3/uL (ref 0.00–0.07)
Basophils Absolute: 0 10*3/uL (ref 0.0–0.1)
Basophils Relative: 1 %
Eosinophils Absolute: 0.1 10*3/uL (ref 0.0–0.5)
Eosinophils Relative: 2 %
HCT: 37.3 % (ref 36.0–46.0)
Hemoglobin: 12.2 g/dL (ref 12.0–15.0)
Immature Granulocytes: 1 %
Lymphocytes Relative: 19 %
Lymphs Abs: 1.2 10*3/uL (ref 0.7–4.0)
MCH: 34.4 pg — ABNORMAL HIGH (ref 26.0–34.0)
MCHC: 32.7 g/dL (ref 30.0–36.0)
MCV: 105.1 fL — ABNORMAL HIGH (ref 80.0–100.0)
Monocytes Absolute: 0.5 10*3/uL (ref 0.1–1.0)
Monocytes Relative: 8 %
Neutro Abs: 4.3 10*3/uL (ref 1.7–7.7)
Neutrophils Relative %: 69 %
Platelets: 154 10*3/uL (ref 150–400)
RBC: 3.55 MIL/uL — ABNORMAL LOW (ref 3.87–5.11)
RDW: 14.3 % (ref 11.5–15.5)
WBC: 6.1 10*3/uL (ref 4.0–10.5)
nRBC: 0 % (ref 0.0–0.2)

## 2023-05-06 LAB — URINALYSIS, ROUTINE W REFLEX MICROSCOPIC
Bilirubin Urine: NEGATIVE
Glucose, UA: NEGATIVE mg/dL
Ketones, ur: NEGATIVE mg/dL
Nitrite: POSITIVE — AB
Protein, ur: 30 mg/dL — AB
Specific Gravity, Urine: 1.01 (ref 1.005–1.030)
WBC, UA: >50 WBC/hpf (ref 0–5)
pH: 7 (ref 5.0–8.0)

## 2023-05-06 LAB — MAGNESIUM: Magnesium: 2 mg/dL (ref 1.7–2.4)

## 2023-05-06 MED ORDER — SODIUM CHLORIDE 0.9 % IV SOLN
1.0000 g | Freq: Once | INTRAVENOUS | Status: AC
  Administered 2023-05-06: 1 g via INTRAVENOUS
  Filled 2023-05-06: qty 10

## 2023-05-06 MED ORDER — LACTATED RINGERS IV BOLUS
500.0000 mL | Freq: Once | INTRAVENOUS | Status: AC
  Administered 2023-05-06: 500 mL via INTRAVENOUS

## 2023-05-06 MED ORDER — OXYCODONE-ACETAMINOPHEN 5-325 MG PO TABS
1.0000 | ORAL_TABLET | Freq: Once | ORAL | Status: AC
  Administered 2023-05-06: 1 via ORAL
  Filled 2023-05-06: qty 1

## 2023-05-06 NOTE — ED Provider Notes (Signed)
Care of patient assumed from Dr. Hyacinth Meeker.  This patient presents with left hip pain.  This is chronic for her but she has had some recent increased difficulty walking with walker.  X-ray imaging shows arthritis.  She is awaiting urinalysis.   Physical Exam  BP (!) 149/71 (BP Location: Right Arm)   Pulse (!) 59   Temp 97.7 F (36.5 C) (Oral)   Resp 18   Ht 5\' 8"  (1.727 m)   Wt 61.2 kg   SpO2 94%   BMI 20.51 kg/m   Physical Exam Vitals and nursing note reviewed.  Constitutional:      General: She is not in acute distress.    Appearance: Normal appearance. She is well-developed. She is not ill-appearing, toxic-appearing or diaphoretic.  HENT:     Head: Normocephalic and atraumatic.     Right Ear: External ear normal.     Left Ear: External ear normal.     Nose: Nose normal.     Mouth/Throat:     Mouth: Mucous membranes are moist.  Eyes:     Extraocular Movements: Extraocular movements intact.  Cardiovascular:     Rate and Rhythm: Normal rate and regular rhythm.  Pulmonary:     Effort: Pulmonary effort is normal. No respiratory distress.  Abdominal:     General: There is no distension.     Palpations: Abdomen is soft.     Tenderness: There is no abdominal tenderness.  Musculoskeletal:        General: No swelling, tenderness or deformity.     Cervical back: Normal range of motion and neck supple.  Skin:    General: Skin is warm and dry.     Coloration: Skin is not jaundiced or pale.  Neurological:     General: No focal deficit present.     Mental Status: She is alert.  Psychiatric:        Mood and Affect: Mood normal.        Behavior: Behavior normal.     Procedures  Procedures  ED Course / MDM    Medical Decision Making Amount and/or Complexity of Data Reviewed Labs: ordered. Radiology: ordered.  Risk Prescription drug management. Decision regarding hospitalization.   Representative from patient's group home called the ED stating that, if patient is unable  to walk with walker, she cannot be accepted back.  She was ambulated with assistance and seems to have her baseline mobility.  It seems that her left hip pain is slightly worsened with walking.  She denies any pain while at rest.  Will obtain CT scan of pelvis to assess for occult fracture.  On CT scan, she does have an age-indeterminate sacral fracture.  She was also found to have presence of gas in her bladder.  Urinalysis confirms UTI.  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  Antibiotics were initiated.  Given her recent weakness and difficulty ambulating, age, and comorbidities, patient to be admitted.       Gloris Manchester, MD 05/06/23 [phone]

## 2023-05-06 NOTE — ED Notes (Signed)
Bladder scanned patient. Patient has 89 ml shown. Nurse notified

## 2023-05-06 NOTE — ED Notes (Signed)
Pt's caregiver at bedside and updated that pt's xray did not show any changes. Per caregiver she cannot take pt back to group home and was told by her social worker at the facility she could refuse to take pt back. This RN spoke to EDP and informed of caregivers request. This RN ambulated pt approximately 20 feet one assist with a standard walker. Per caregiver pt ambulate about her normal, pt did complain of pain while ambulating. Pt assisted by wheelchair to restroom to attempt a urine sample with no success, pt was cleaned and new brief, pants, and shirt put on pt. Caregiver informed this RN that she wants the CT pelvis and to call when results were back. EDP notified.

## 2023-05-06 NOTE — ED Triage Notes (Addendum)
Pt BIB ems for left hip pain. Per EMS pt has hx of chronic bone disease of left hip but per the nurse at the facility pt states worsening pain. Pt normally ambulatory with a walker but unable to tolerate pain this morning. Per EMS nurse at facility also requesting [place] to do a urinalysis.

## 2023-05-07 DIAGNOSIS — E46 Unspecified protein-calorie malnutrition: Secondary | ICD-10-CM | POA: Insufficient documentation

## 2023-05-07 DIAGNOSIS — R531 Weakness: Secondary | ICD-10-CM

## 2023-05-07 DIAGNOSIS — M25551 Pain in right hip: Secondary | ICD-10-CM | POA: Insufficient documentation

## 2023-05-07 DIAGNOSIS — M109 Gout, unspecified: Secondary | ICD-10-CM | POA: Insufficient documentation

## 2023-05-07 DIAGNOSIS — N3080 Other cystitis without hematuria: Secondary | ICD-10-CM | POA: Diagnosis not present

## 2023-05-07 LAB — CBC
HCT: 37.8 % (ref 36.0–46.0)
Hemoglobin: 12.5 g/dL (ref 12.0–15.0)
MCH: 34.8 pg — ABNORMAL HIGH (ref 26.0–34.0)
MCHC: 33.1 g/dL (ref 30.0–36.0)
MCV: 105.3 fL — ABNORMAL HIGH (ref 80.0–100.0)
Platelets: 142 10*3/uL — ABNORMAL LOW (ref 150–400)
RBC: 3.59 MIL/uL — ABNORMAL LOW (ref 3.87–5.11)
RDW: 14.3 % (ref 11.5–15.5)
WBC: 6.1 10*3/uL (ref 4.0–10.5)
nRBC: 0 % (ref 0.0–0.2)

## 2023-05-07 LAB — COMPREHENSIVE METABOLIC PANEL
ALT: 14 U/L (ref 0–44)
AST: 18 U/L (ref 15–41)
Albumin: 2.8 g/dL — ABNORMAL LOW (ref 3.5–5.0)
Alkaline Phosphatase: 60 U/L (ref 38–126)
Anion gap: 8 (ref 5–15)
BUN: 11 mg/dL (ref 8–23)
CO2: 25 mmol/L (ref 22–32)
Calcium: 8.5 mg/dL — ABNORMAL LOW (ref 8.9–10.3)
Chloride: 104 mmol/L (ref 98–111)
Creatinine, Ser: 0.71 mg/dL (ref 0.44–1.00)
GFR, Estimated: >60 mL/min (ref >60)
Glucose, Bld: 78 mg/dL (ref 70–99)
Potassium: 3.8 mmol/L (ref 3.5–5.1)
Sodium: 137 mmol/L (ref 135–145)
Total Bilirubin: 1.1 mg/dL (ref 0.0–1.2)
Total Protein: 6.4 g/dL — ABNORMAL LOW (ref 6.5–8.1)

## 2023-05-07 LAB — PHOSPHORUS: Phosphorus: 3.8 mg/dL (ref 2.5–4.6)

## 2023-05-07 LAB — MAGNESIUM: Magnesium: 2 mg/dL (ref 1.7–2.4)

## 2023-05-07 LAB — MRSA NEXT GEN BY PCR, NASAL: MRSA by PCR Next Gen: NOT DETECTED

## 2023-05-07 LAB — FOLATE: Folate: 12 ng/mL (ref >5.9)

## 2023-05-07 LAB — VITAMIN B12: Vitamin B-12: 248 pg/mL (ref 180–914)

## 2023-05-07 MED ORDER — ALLOPURINOL 300 MG PO TABS
300.0000 mg | ORAL_TABLET | Freq: Every day | ORAL | Status: DC
  Administered 2023-05-08 – 2023-05-10 (×3): 300 mg via ORAL
  Filled 2023-05-07: qty 3
  Filled 2023-05-07 (×2): qty 1

## 2023-05-07 MED ORDER — ALBUTEROL SULFATE HFA 108 (90 BASE) MCG/ACT IN AERS: 1.0000 | INHALATION_SPRAY | Freq: Four times a day (QID) | RESPIRATORY_TRACT | Status: DC | PRN

## 2023-05-07 MED ORDER — DIGOXIN 125 MCG PO TABS: 0.1250 mg | ORAL_TABLET | Freq: Every day | ORAL | Status: DC

## 2023-05-07 MED ORDER — SODIUM CHLORIDE 0.9 % IV SOLN
1.0000 g | INTRAVENOUS | Status: DC
  Administered 2023-05-07 – 2023-05-09 (×3): 1 g via INTRAVENOUS
  Filled 2023-05-07 (×3): qty 10

## 2023-05-07 MED ORDER — APIXABAN 5 MG PO TABS
5.0000 mg | ORAL_TABLET | Freq: Two times a day (BID) | ORAL | Status: DC
  Administered 2023-05-07 – 2023-05-10 (×6): 5 mg via ORAL
  Filled 2023-05-07 (×6): qty 1

## 2023-05-07 MED ORDER — ENSURE ENLIVE PO LIQD
237.0000 mL | Freq: Two times a day (BID) | ORAL | Status: DC
  Administered 2023-05-07 – 2023-05-10 (×7): 237 mL via ORAL

## 2023-05-07 MED ORDER — DONEPEZIL HCL 5 MG PO TABS
10.0000 mg | ORAL_TABLET | Freq: Every day | ORAL | Status: DC
  Administered 2023-05-08 – 2023-05-10 (×3): 10 mg via ORAL
  Filled 2023-05-07 (×3): qty 2

## 2023-05-07 MED ORDER — ALBUTEROL SULFATE (2.5 MG/3ML) 0.083% IN NEBU: 2.5000 mg | INHALATION_SOLUTION | Freq: Four times a day (QID) | RESPIRATORY_TRACT | Status: DC | PRN

## 2023-05-07 MED ORDER — ENOXAPARIN SODIUM 40 MG/0.4ML IJ SOSY: 40.0000 mg | PREFILLED_SYRINGE | INTRAMUSCULAR | Status: DC

## 2023-05-07 NOTE — Progress Notes (Signed)
 PROGRESS NOTE     [name], is a 89 y.o. female, DOB - [birth year], FMW:[id number]  Admit date - 05/06/2023   Admitting Physician Posey Maier, DO  Outpatient Primary MD for the patient is [name], FNP  LOS - 1  Chief Complaint  Patient presents with   Hip Pain       Brief Narrative:  89 y.o. female with medical history significant of hypertension, CHF, chronic hip pain, COPD, gout, permanent atrial fibrillation, dementia admitted with acute metabolic encephalopathy in the setting of presumed UTI    -Assessment and Plan: 1)UTI---CT abdomen and pelvis was suggestive of emphysematous cystitis  -History of recurrent UTI- -IV Rocephin  pending culture data  2)Dementia with significant cognitive and memory deficits -- Continue Aricept  -Supportive care  3) chronic atrial fibrillation-- -will discontinue digoxin  at this 89 year old female -Metoprolol  on hold due to bradycardia concerns--patient has history of tachybradycardia syndrome -Continue Eliquis  for stroke prophylaxis, also patient has history of prior PE  4)H/o Gout--- continue allopurinol   Status is: Inpatient   Disposition: The patient is from: ALF              Anticipated d/c is to: SNF--- memory care unit              Anticipated d/c date is: 1 day              Patient currently is not medically stable to d/c. Barriers: Not Clinically Stable-   Code Status :  -  Code Status: Limited: Do not attempt resuscitation (DNR) -DNR-LIMITED -Do Not Intubate/DNI    Family Communication:    (patient is alert, awake and coherent)   DVT Prophylaxis  :   - SCDs  SCDs Start: 05/07/23 0240 apixaban  (ELIQUIS ) tablet 5 mg   Lab Results  Component Value Date   PLT 142 (L) 05/07/2023   Inpatient Medications  Scheduled Meds:  [START ON 05/08/2023] allopurinol   300 mg Oral Daily   apixaban   5 mg Oral BID   [START ON 05/08/2023] digoxin   0.125 mg Oral Daily   [START ON 05/08/2023] donepezil   10 mg Oral Daily    feeding supplement  237 mL Oral BID BM   Continuous Infusions:  cefTRIAXone  (ROCEPHIN )  IV     PRN Meds:.albuterol    Anti-infectives (From admission, onward)    Start     Dose/Rate Route Frequency Ordered Stop   05/07/23 2100  cefTRIAXone  (ROCEPHIN ) 1 g in sodium chloride  0.9 % 100 mL IVPB        1 g 200 mL/hr over 30 Minutes Intravenous Every 24 hours 05/07/23 0233     05/06/23 2000  cefTRIAXone  (ROCEPHIN ) 1 g in sodium chloride  0.9 % 100 mL IVPB        1 g 200 mL/hr over 30 Minutes Intravenous  Once 05/06/23 1958 05/06/23 2116       Subjective: [name] today has no fevers, no emesis,  No chest pain,   - Resting comfortably -Pleasantly confused  Objective: Vitals:   05/06/23 2327 05/07/23 0426 05/07/23 0735 05/07/23 1332  BP: 124/83 (!) 143/80 (!) 135/57 (!) 139/56  Pulse: (!) 59 60 62 (!) 59  Resp: 16 16 17 17   Temp: 97.7 F (36.5 C) 97.7 F (36.5 C) 97.7 F (36.5 C) 97.9 F (36.6 C)  TempSrc: Temporal  Oral   SpO2:  97% 91% 95%  Weight: 64.9 kg     Height:        Intake/Output Summary (Last 24  hours) at 05/07/2023 1915 Last data filed at 05/07/2023 0900 Gross per 24 hour  Intake 940 ml  Output 200 ml  Net 740 ml   Filed Weights   05/06/23 1256 05/06/23 2327  Weight: 61.2 kg 64.9 kg    Physical Exam  Gen:- Awake Alert, in no apparent distress, pleasantly confused HEENT:- [place].AT, No sclera icterus Neck-Supple Neck,No JVD,.  Lungs-  CTAB , fair symmetrical air movement CV- S1, S2 normal, regular  Abd-  +ve B.Sounds, Abd Soft, No tenderness,    Extremity/Skin:- No  edema, pedal pulses present  Psych-pleasantly confused, obvious cognitive and memory deficits, consistent with underlying dementia  -neuro-generalized weakness, no new focal deficits, no tremors  Data Reviewed: I have personally reviewed following labs and imaging studies  CBC: Recent Labs  Lab 05/06/23 2033 05/07/23 0502  WBC 6.1 6.1  NEUTROABS 4.3  --   HGB 12.2 12.5   HCT 37.3 37.8  MCV 105.1* 105.3*  PLT 154 142*   Basic Metabolic Panel: Recent Labs  Lab 05/06/23 2033 05/07/23 0502  NA 137 137  K 4.0 3.8  CL 104 104  CO2 25 25  GLUCOSE 86 78  BUN 12 11  CREATININE 0.77 0.71  CALCIUM 8.5* 8.5*  MG 2.0 2.0  PHOS  --  3.8   GFR: Estimated Creatinine Clearance: 42.4 mL/min (by C-G formula based on SCr of 0.71 mg/dL). Liver Function Tests: Recent Labs  Lab 05/06/23 2033 05/07/23 0502  AST 18 18  ALT 14 14  ALKPHOS 60 60  BILITOT 1.2 1.1  PROT 6.4* 6.4*  ALBUMIN 2.8* 2.8*   Recent Results (from the past 240 hours)  MRSA Next Gen by PCR, Nasal     Status: None   Collection Time: 05/06/23 11:46 PM   Specimen: Nasal Mucosa; Nasal Swab  Result Value Ref Range Status   MRSA by PCR Next Gen NOT DETECTED NOT DETECTED Final    Comment: (NOTE) The GeneXpert MRSA Assay (FDA approved for NASAL specimens only), is one component of a comprehensive MRSA colonization surveillance program. It is not intended to diagnose MRSA infection nor to guide or monitor treatment for MRSA infections. Test performance is not FDA approved in patients less than 47 years old. Performed at [facility], [address] [place], [place] [address]     Radiology Studies: CT PELVIS WO CONTRAST Result Date: 05/06/2023 CLINICAL DATA:  Worsening pain in left hip. Unable to tolerate pain. EXAM: CT PELVIS WITHOUT CONTRAST TECHNIQUE: Multidetector CT imaging of the pelvis was performed following the standard protocol without intravenous contrast. RADIATION DOSE REDUCTION: This exam was performed according to the departmental dose-optimization program which includes automated exposure control, adjustment of the mA and/or kV according to patient size and/or use of iterative reconstruction technique. COMPARISON:  Same day hip radiograph and CT abdomen pelvis 11/29/2022 FINDINGS: Urinary Tract: Bladder wall thickening greatest anteriorly. Small locule of gas within the bladder.  Left pelvic kidney. Bowel: Extensive diverticulosis of the visualized colon. No evidence of diverticulitis. Vascular/Lymphatic: Arterial atherosclerotic calcification. No lymphadenopathy. Reproductive:  No acute abnormality. Other:  Small volume free fluid in the pelvis. Musculoskeletal: Demineralization. Age indeterminate buckle fracture of the anterior cortex of S3, new since 11/29/2022. No hip fracture or dislocation. Advanced osteoarthritis of both hips greater on the left where there is complete loss of joint space superiorly with subchondral cystic change in the acetabulum and femoral head. IMPRESSION: 1. Age indeterminate buckle fracture of the anterior cortex of S3, new since 11/29/2022. 2.  No hip fracture or dislocation. 3. Advanced osteoarthritis of both hips greater on the left where there is complete loss of joint space superiorly. 4. Bladder wall thickening greatest anteriorly and small locules of gas in the bladder. Correlate with urinalysis to exclude cystitis. Electronically Signed   By: Norman Gatlin M.D.   On: 05/06/2023 19:04   DG Hip Unilat With Pelvis 2-3 Views Left Result Date: 05/06/2023 CLINICAL DATA:  Left hip pain, worsening, no reported injury EXAM: DG HIP (WITH OR WITHOUT PELVIS) 2-3V LEFT COMPARISON:  01/30/2023 pelvic and left hip radiographs FINDINGS: No pelvic fracture or diastasis. No left hip fracture or dislocation. Severe left hip osteoarthritis with subchondral sclerosis and cystic change and partial articular collapse with bulky marginal acetabular osteophyte, not definitely changed. Moderate to severe right hip osteoarthritis, unchanged. Marked degenerative disc disease in the visualized lower lumbar spine. No suspicious focal osseous lesions. IMPRESSION: 1. No acute osseous abnormality. 2. Severe left hip osteoarthritis, not definitely changed. 3. Moderate to severe right hip osteoarthritis, unchanged. 4. Marked degenerative disc disease in the visualized lower lumbar  spine. Electronically Signed   By: Selinda DELENA Blue M.D.   On: 05/06/2023 14:49   Scheduled Meds:  [START ON 05/08/2023] allopurinol   300 mg Oral Daily   apixaban   5 mg Oral BID   [START ON 05/08/2023] digoxin   0.125 mg Oral Daily   [START ON 05/08/2023] donepezil   10 mg Oral Daily   feeding supplement  237 mL Oral BID BM   Continuous Infusions:  cefTRIAXone  (ROCEPHIN )  IV      LOS: 1 day   Rendall Carwin M.D on 05/07/2023 at 7:15 PM  Go to [email or web address] - for contact info  [facility] - Office  [phone]  If 7PM-7AM, please contact night-coverage [email or web address] 05/07/2023, 7:15 PM

## 2023-05-07 NOTE — Plan of Care (Signed)
   Problem: Education: Goal: Knowledge of General Education information will improve Description Including pain rating scale, medication(s)/side effects and non-pharmacologic comfort measures Outcome: Progressing

## 2023-05-07 NOTE — Plan of Care (Signed)
   Problem: Activity: Goal: Risk for activity intolerance will decrease Outcome: Progressing   Problem: Coping: Goal: Level of anxiety will decrease Outcome: Progressing   Problem: Safety: Goal: Ability to remain free from injury will improve Outcome: Progressing

## 2023-05-07 NOTE — NC FL2 (Signed)
  [place]  MEDICAID FL2 LEVEL OF CARE FORM     IDENTIFICATION  Patient Name: NEAVE LENGER Birthdate: Sep 22, 1925 Sex: female Admission Date (Current Location): 05/06/2023  [facility] and [place] Number:  [employer] and Address:  [facility],  [address], [place] [address]      Provider Number: [id number]  Attending Physician Name and Address:  Pearlean Manus, MD  Relative Name and Phone Number:  Suzen Cliche ( niece) [phone]    Current Level of Care: Hospital Recommended Level of Care: [facility] Prior Approval Number:    Date Approved/Denied:   PASRR Number: [id number]  Discharge Plan: SNF    Current Diagnoses: Patient Active Problem List   Diagnosis Date Noted   Generalized weakness 05/07/2023   Bilateral hip pain 05/07/2023   Hypoalbuminemia due to protein-calorie malnutrition (HCC) 05/07/2023   Gout 05/07/2023   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   Hemorrhoids 10/06/2019   At high risk for injury related to fall 08/27/2019   Keratotic lesion 07/22/2019   Hypokalemia 12/05/2016   Dementia with behavioral disturbance (HCC) 04/25/2016   Noncompliance with medication regimen 04/24/2016   UTI (urinary tract infection) 04/18/2016   Chest pain 02/10/2014   Edema 02/10/2014   Encounter for therapeutic drug monitoring 08/13/2013   Chronic anticoagulation 07/22/2010   HYPOTENSION, ORTHOSTATIC 06/15/2010   Essential hypertension, benign 04/06/2009   Cardiac pacemaker in situ 04/06/2009   PULMONARY EMBOLISM 01/18/2009   ATRIAL FIBRILLATION, CHRONIC 01/18/2009   BRADYCARDIA-TACHYCARDIA SYNDROME 01/18/2009   DVT 01/18/2009    Orientation RESPIRATION BLADDER Height & Weight     Self  Normal Incontinent Weight: 143 lb 1.3 oz (64.9 kg) Height:  5' 8 (172.7 cm)  BEHAVIORAL SYMPTOMS/MOOD NEUROLOGICAL BOWEL NUTRITION STATUS      Incontinent Diet (see DC summary)  AMBULATORY STATUS COMMUNICATION OF NEEDS Skin    Extensive Assist Verbally Normal                       Personal Care Assistance Level of Assistance  Bathing, Feeding, Dressing Bathing Assistance: Maximum assistance Feeding assistance: Limited assistance Dressing Assistance: Maximum assistance     Functional Limitations Info  Sight, Speech, Hearing Sight Info: Adequate Hearing Info: Adequate Speech Info: Adequate    SPECIAL CARE FACTORS FREQUENCY  PT (By licensed PT), OT (By licensed OT)     PT Frequency: 5 x a week OT Frequency: 5 x a week            Contractures Contractures Info: Not present    Additional Factors Info  Code Status, Allergies Code Status Info: DNR-limited Allergies Info: Bee Venom and Codeine           Current Medications (05/07/2023):  This is the current hospital active medication list Current Facility-Administered Medications  Medication Dose Route Frequency Provider Last Rate Last Admin   cefTRIAXone  (ROCEPHIN ) 1 g in sodium chloride  0.9 % 100 mL IVPB  1 g Intravenous Q24H Adefeso, Oladapo, DO       feeding supplement (ENSURE ENLIVE / ENSURE PLUS) liquid 237 mL  237 mL Oral BID BM Adefeso, Oladapo, DO   237 mL at 05/07/23 1358     Discharge Medications: Please see discharge summary for a list of discharge medications.  Relevant Imaging Results:  Relevant Lab Results:   Additional Information SSN: [id number]  Noreen KATHEE [place], [place]

## 2023-05-07 NOTE — TOC Initial Note (Signed)
 Transition of Care [facility]) - Initial/Assessment Note    Patient Details  Name: [name] MRN: [id number] Date of Birth: 07-21-1925  [facility] CM/SW Contact:    Noreen KATHEE Cleotilde ISRAEL Phone Number: 05/07/2023, 1:31 PM  Clinical Narrative:                 CSW spoke with Monta at [facility] and shared that patient will be ready to return back on Thursday. Darlene stated that patient cannot return back due to her decline in mobility and will need a SNF. CSW then called niece to shared update and niece was agreeable to CSW finding SNF placement. Niece said that she would like for patient to be in [place]. TOC will start PASRR and FL2 but waiting on PT notes to start Auth. TOC will continue to follow.     Expected Discharge Plan: Skilled Nursing Facility Barriers to Discharge: Continued Medical Work up   Patient Goals and CMS Choice Patient states their goals for this hospitalization and ongoing recovery are:: DC to a SNF once medically ready CMS Medicare.gov Compare Post Acute Care list provided to:: Patient Represenative (must comment) (Spoke with niece Suzen)        Expected Discharge Plan and Services In-house Referral: Clinical Social Work   Post Acute Care Choice: Skilled Nursing Facility Living arrangements for the past 2 months: Assisted Living Facility (Adult Care Home)                     Prior Living Arrangements/Services Living arrangements for the past 2 months: Assisted Living Facility (Adult Care Home) Lives with:: Facility Resident Patient language and need for interpreter reviewed:: Yes Do you feel safe going back to the place where you live?: Yes      Need for Family Participation in Patient Care: Yes (Comment) Care giver support system in place?: Yes (comment) Current home services: DME Criminal Activity/Legal Involvement Pertinent to Current Situation/Hospitalization: No - Comment as needed  Activities of  Daily Living   ADL Screening (condition at time of admission) Independently performs ADLs?: No Does the patient have a NEW difficulty with bathing/dressing/toileting/self-feeding that is expected to last >3 days?: No Does the patient have a NEW difficulty with getting in/out of bed, walking, or climbing stairs that is expected to last >3 days?: Yes (Initiates electronic notice to provider for possible PT consult) Does the patient have a NEW difficulty with communication that is expected to last >3 days?: No Is the patient deaf or have difficulty hearing?: No Does the patient have difficulty seeing, even when wearing glasses/contacts?: No Does the patient have difficulty concentrating, remembering, or making decisions?: Yes  Permission Sought/Granted Permission sought to share information with : Family Supports    Share Information with NAME: Suzen     Permission granted to share info w Relationship: Niece     Emotional Assessment Appearance:: Appears stated age   Affect (typically observed): Appropriate Orientation: : Oriented to Self Alcohol / Substance Use: Not Applicable Psych Involvement: No (comment)  Admission diagnosis:  Emphysematous cystitis [N30.80] Left hip pain [M25.552] Generalized weakness [R53.1] Patient Active Problem List   Diagnosis Date Noted   Generalized weakness 05/07/2023   Bilateral hip pain 05/07/2023   Hypoalbuminemia due to protein-calorie malnutrition (HCC) 05/07/2023   Gout 05/07/2023   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   Hemorrhoids 10/06/2019   At high risk for injury related to fall 08/27/2019   Keratotic lesion 07/22/2019   Hypokalemia  12/05/2016   Dementia with behavioral disturbance (HCC) 04/25/2016   Noncompliance with medication regimen 04/24/2016   UTI (urinary tract infection) 04/18/2016   Chest pain 02/10/2014   Edema 02/10/2014   Encounter for therapeutic drug monitoring 08/13/2013   Chronic anticoagulation 07/22/2010    HYPOTENSION, ORTHOSTATIC 06/15/2010   Essential hypertension, benign 04/06/2009   Cardiac pacemaker in situ 04/06/2009   PULMONARY EMBOLISM 01/18/2009   ATRIAL FIBRILLATION, CHRONIC 01/18/2009   BRADYCARDIA-TACHYCARDIA SYNDROME 01/18/2009   DVT 01/18/2009   PCP:  [name], FNP Pharmacy:   [facility] [place] - [address] [place] [address] Phone: [phone] Fax: [phone]     Social Drivers of Health (SDOH) Social History: SDOH Screenings   Food Insecurity: Patient Unable To Answer (05/06/2023)  Housing: Patient Unable To Answer (05/06/2023)  Transportation Needs: Patient Unable To Answer (05/06/2023)  Utilities: Patient Unable To Answer (05/06/2023)  Depression (PHQ2-9): Low Risk  (08/27/2019)  Social Connections: Patient Unable To Answer (05/06/2023)  Tobacco Use: Medium Risk (05/06/2023)   SDOH Interventions:     Readmission Risk Interventions    05/07/2023    1:16 PM  Readmission Risk Prevention Plan  Medication Screening Complete  Transportation Screening Complete

## 2023-05-08 DIAGNOSIS — N3080 Other cystitis without hematuria: Secondary | ICD-10-CM | POA: Diagnosis not present

## 2023-05-08 MED ORDER — ACETAMINOPHEN 325 MG PO TABS
650.0000 mg | ORAL_TABLET | Freq: Four times a day (QID) | ORAL | Status: DC | PRN
  Administered 2023-05-08 – 2023-05-10 (×3): 650 mg via ORAL
  Filled 2023-05-08 (×3): qty 2

## 2023-05-08 NOTE — Evaluation (Signed)
 Occupational Therapy Evaluation Patient Details Name: [name] MRN: [id number] DOB: May 11, 1925 Today's Date: 05/08/2023   History of Present Illness [name] is a 89 y.o. female with medical history significant of hypertension, CHF, chronic hip pain, COPD, gout, permanent atrial fibrillation, dementia who presents to the emergency department due to complaint of hip pain.  Patient was unable to provide a history possibly due to underlying dementia, history was provided by ED physician and ED medical record.  Per report, patient had more difficulty in being able to move the left hip.  She denies vomiting, fever, chills, diarrhea, there was concern for UTI in patient as well.   Clinical Impression   Pt admitted for concerns listed above. PTA pt was living in an Adult Group Home, where she received minimal assist with ADL's and max assist with IADL's. Pt now presenting with increased weakness, difficulty following simple commands, and poor activity tolerance. She is requiring max A +2 for functional mobility, min to mod assist for sitting EOB, and min to max assist for all ADL's. Recommending skilled services for pt, following discharge, in order to maximize independence and safety. Acute OT will continue to follow.        If plan is discharge home, recommend the following: Two people to help with walking and/or transfers;Two people to help with bathing/dressing/bathroom;Assistance with cooking/housework;Assistance with feeding;Direct supervision/assist for medications management;Help with stairs or ramp for entrance;Assist for transportation    Functional Status Assessment  Patient has had a recent decline in their functional status and demonstrates the ability to make significant improvements in function in a reasonable and predictable amount of time.  Equipment Recommendations  None recommended by OT    Recommendations for Other Services       Precautions / Restrictions  Precautions Precautions: Fall Restrictions Weight Bearing Restrictions Per Provider Order: No      Mobility Bed Mobility Overal bed mobility: Needs Assistance Bed Mobility: Supine to Sit, Sit to Supine     Supine to sit: Min assist, HOB elevated Sit to supine: Min assist   General bed mobility comments: frequent cueing and assist to transition to seated EOB    Transfers Overall transfer level: Needs assistance Equipment used: Rolling walker (2 wheels) Transfers: Sit to/from Stand Sit to Stand: Mod assist, +2 physical assistance, +2 safety/equipment           General transfer comment: assist to power up to standing with RW      Balance Overall balance assessment: Needs assistance Sitting-balance support: Feet supported, Bilateral upper extremity supported Sitting balance-Leahy Scale: Fair     Standing balance support: Bilateral upper extremity supported, Reliant on assistive device for balance Standing balance-Leahy Scale: Poor                             ADL either performed or assessed with clinical judgement   ADL Overall ADL's : Needs assistance/impaired Eating/Feeding: Minimal assistance;Bed level   Grooming: Minimal assistance;Bed level   Upper Body Bathing: Moderate assistance;Bed level   Lower Body Bathing: Maximal assistance;Bed level   Upper Body Dressing : Moderate assistance;Bed level   Lower Body Dressing: Maximal assistance;Bed level   Toilet Transfer: Maximal assistance;+2 for physical assistance;+2 for safety/equipment   Toileting- Clothing Manipulation and Hygiene: Bed level;Total assistance         General ADL Comments: Pt requiring increased assist due to poor sitting balance and weakness, as well as difficulty following simple  1 step commands     Vision Baseline Vision/History: 0 No visual deficits Ability to See in Adequate Light: 0 Adequate Patient Visual Report: No change from baseline Vision Assessment?: No  apparent visual deficits     Perception         Praxis         Pertinent Vitals/Pain Pain Assessment Pain Assessment: Faces Faces Pain Scale: Hurts little more Pain Location: legs  with standing Pain Descriptors / Indicators: Grimacing, Guarding Pain Intervention(s): Limited activity within patient's tolerance, Monitored during session, Repositioned     Extremity/Trunk Assessment Upper Extremity Assessment Upper Extremity Assessment: Generalized weakness   Lower Extremity Assessment Lower Extremity Assessment: Defer to PT evaluation   Cervical / Trunk Assessment Cervical / Trunk Assessment: Kyphotic   Communication Communication Communication: No apparent difficulties   Cognition Arousal: Alert Behavior During Therapy: WFL for tasks assessed/performed Overall Cognitive Status: Within Functional Limits for tasks assessed                                       General Comments  VSS on RA    Exercises     Shoulder Instructions      Home Living Family/patient expects to be discharged to:: Skilled nursing facility                                        Prior Functioning/Environment Prior Level of Function : Patient poor historian/Family not available                        OT Problem List: Decreased strength;Decreased activity tolerance;Decreased range of motion;Impaired balance (sitting and/or standing);Decreased safety awareness;Decreased cognition;Decreased knowledge of use of DME or AE;Impaired UE functional use;Pain      OT Treatment/Interventions: Self-care/ADL training;Therapeutic exercise;Energy conservation;DME and/or AE instruction;Therapeutic activities;Cognitive remediation/compensation;Patient/family education;Balance training    OT Goals(Current goals can be found in the care plan section) Acute Rehab OT Goals Patient Stated Goal: To stop pain OT Goal Formulation: With patient Time For Goal Achievement:  05/22/23 Potential to Achieve Goals: Fair ADL Goals Pt Will Perform Grooming: with supervision;sitting Pt Will Perform Lower Body Bathing: with mod assist;sitting/lateral leans;sit to/from stand Pt Will Perform Lower Body Dressing: with mod assist;sitting/lateral leans;sit to/from stand Pt Will Transfer to Toilet: with mod assist;stand pivot transfer Pt Will Perform Toileting - Clothing Manipulation and hygiene: with mod assist;sitting/lateral leans;sit to/from stand  OT Frequency: Min 1X/week    Co-evaluation   Reason for Co-Treatment: To address functional/ADL transfers PT goals addressed during session: Mobility/safety with mobility;Balance;Proper use of DME;Strengthening/ROM        AM-PAC OT 6 Clicks Daily Activity     Outcome Measure Help from another person eating meals?: A Little Help from another person taking care of personal grooming?: A Little Help from another person toileting, which includes using toliet, bedpan, or urinal?: Total Help from another person bathing (including washing, rinsing, drying)?: A Lot Help from another person to put on and taking off regular upper body clothing?: A Lot Help from another person to put on and taking off regular lower body clothing?: A Lot 6 Click Score: 13   End of Session Equipment Utilized During Treatment: Gait belt;Rolling walker (2 wheels)  Activity Tolerance: Patient limited by fatigue Patient left: in bed;with call bell/phone  within reach;with bed alarm set  OT Visit Diagnosis: Unsteadiness on feet (R26.81);Other abnormalities of gait and mobility (R26.89);Muscle weakness (generalized) (M62.81)                Time: 8986-8974 OT Time Calculation (min): 12 min Charges:  OT General Charges $OT Visit: 1 Visit OT Evaluation $OT Eval Moderate Complexity: 1 Mod  Charitie Hinote, OTR/L [facility]  [place] Pierron Jillyn Nightingale 05/08/2023, 12:46 PM

## 2023-05-08 NOTE — Evaluation (Signed)
 Physical Therapy Evaluation Patient Details Name: [name] MRN: [id number] DOB: [birth year] Today's Date: 05/08/2023  History of Present Illness  [name] is a 89 y.o. female with medical history significant of hypertension, CHF, chronic hip pain, COPD, gout, permanent atrial fibrillation, dementia who presents to the emergency department due to complaint of hip pain.  Patient was unable to provide a history possibly due to underlying dementia, history was provided by ED physician and ED medical record.  Per report, patient had more difficulty in being able to move the left hip.  She denies vomiting, fever, chills, diarrhea, there was concern for UTI in patient as well.   Clinical Impression  Patient limited for functional mobility as stated below secondary to BLE weakness, fatigue and poor standing balance. Patient requires frequent cuing for bed mobility and performs with labored movements. She demonstrate fair sitting balance at bedside. Patient requires mod assist and use of RW to transfer to standing and demonstrates limited standing tolerance requesting to sit due to fatigue. Patient stating fatigue limiting ability to perform any further mobility. Patient will benefit from continued physical therapy in hospital and recommended venue below to increase strength, balance, endurance for safe ADLs and gait.         If plan is discharge home, recommend the following: A lot of help with bathing/dressing/bathroom;A lot of help with walking and/or transfers   Can travel by private vehicle   No    Equipment Recommendations None recommended by PT  Recommendations for Other Services       Functional Status Assessment Patient has had a recent decline in their functional status and demonstrates the ability to make significant improvements in function in a reasonable and predictable amount of time.     Precautions / Restrictions Precautions Precautions:  Fall Restrictions Weight Bearing Restrictions Per Provider Order: No      Mobility  Bed Mobility Overal bed mobility: Needs Assistance Bed Mobility: Supine to Sit, Sit to Supine     Supine to sit: Min assist, HOB elevated Sit to supine: Min assist   General bed mobility comments: frequent cueing and assist to transition to seated EOB    Transfers Overall transfer level: Needs assistance Equipment used: Rolling walker (2 wheels) Transfers: Sit to/from Stand Sit to Stand: Mod assist           General transfer comment: assist to power up to standing with RW    Ambulation/Gait                  Stairs            Wheelchair Mobility     Tilt Bed    Modified Rankin (Stroke Patients Only)       Balance Overall balance assessment: Needs assistance Sitting-balance support: Feet supported, Bilateral upper extremity supported Sitting balance-Leahy Scale: Fair     Standing balance support: Bilateral upper extremity supported, Reliant on assistive device for balance Standing balance-Leahy Scale: Poor                               Pertinent Vitals/Pain Pain Assessment Pain Assessment: Faces Faces Pain Scale: Hurts little more Pain Location: legs  with standing Pain Descriptors / Indicators: Grimacing, Guarding Pain Intervention(s): Limited activity within patient's tolerance, Monitored during session, Repositioned    Home Living Family/patient expects to be discharged to:: Assisted living  Prior Function Prior Level of Function : Patient poor historian/Family not available                     Extremity/Trunk Assessment   Upper Extremity Assessment Upper Extremity Assessment: Defer to OT evaluation    Lower Extremity Assessment Lower Extremity Assessment: Generalized weakness       Communication   Communication Communication: No apparent difficulties  Cognition Arousal: Alert Behavior  During Therapy: WFL for tasks assessed/performed Overall Cognitive Status: Within Functional Limits for tasks assessed                                          General Comments      Exercises     Assessment/Plan    PT Assessment Patient needs continued PT services  PT Problem List Decreased strength;Decreased activity tolerance;Decreased balance;Decreased mobility       PT Treatment Interventions DME instruction;Neuromuscular re-education;Gait training;Stair training;Functional mobility training;Therapeutic activities;Therapeutic exercise;Balance training;Patient/family education;Manual techniques    PT Goals (Current goals can be found in the Care Plan section)  Acute Rehab PT Goals Patient Stated Goal: get better PT Goal Formulation: With patient Time For Goal Achievement: 05/22/23 Potential to Achieve Goals: Fair    Frequency Min 3X/week     Co-evaluation PT/OT/SLP Co-Evaluation/Treatment: Yes Reason for Co-Treatment: To address functional/ADL transfers PT goals addressed during session: Mobility/safety with mobility;Balance;Proper use of DME;Strengthening/ROM         AM-PAC PT 6 Clicks Mobility  Outcome Measure Help needed turning from your back to your side while in a flat bed without using bedrails?: A Little Help needed moving from lying on your back to sitting on the side of a flat bed without using bedrails?: A Little Help needed moving to and from a bed to a chair (including a wheelchair)?: A Lot Help needed standing up from a chair using your arms (e.g., wheelchair or bedside chair)?: A Lot Help needed to walk in hospital room?: A Lot Help needed climbing 3-5 steps with a railing? : A Lot 6 Click Score: 14    End of Session Equipment Utilized During Treatment: Gait belt Activity Tolerance: Patient limited by fatigue Patient left: in bed;with call bell/phone within reach;with bed alarm set Nurse Communication: Mobility status PT Visit  Diagnosis: Unsteadiness on feet (R26.81);Other abnormalities of gait and mobility (R26.89);Muscle weakness (generalized) (M62.81)    Time: 8986-8974 PT Time Calculation (min) (ACUTE ONLY): 12 min   Charges:   PT Evaluation $PT Eval Low Complexity: 1 Low   PT General Charges $$ ACUTE PT VISIT: 1 Visit         12:15 PM, 05/08/23 Prentice [name] PT, DPT Physical Therapist at [facility]

## 2023-05-08 NOTE — Progress Notes (Signed)
 PROGRESS NOTE     [name], is a 89 y.o. female, DOB - Oct 06, 1925, FMW:[id number]  Admit date - 05/06/2023   Admitting Physician Posey Maier, DO  Outpatient Primary MD for the patient is [name], FNP  LOS - 2  Chief Complaint  Patient presents with   Hip Pain       Brief Narrative:  89 y.o. female with medical history significant of hypertension, CHF, chronic hip pain, COPD, gout, permanent atrial fibrillation, dementia admitted with acute metabolic encephalopathy in the setting of presumed UTI    -Assessment and Plan: 1)E coli UTI---CT abdomen and pelvis was suggestive of emphysematous cystitis  -History of recurrent UTI- -Urine cx from 05/06/23 with E coli -c/n IV Rocephin  pending culture data  2)Dementia with significant cognitive and memory deficits -- Continue Aricept  -Supportive care  3) chronic atrial fibrillation-- - discontinued digoxin  at this 89 year old female -Metoprolol  on hold due to bradycardia concerns--patient has history of tachybradycardia syndrome -Continue Eliquis  for stroke prophylaxis, also patient has history of prior PE  4)H/o Gout--- continue allopurinol   5)Generalized weakness and deconditioning--PT eval appreciated recommends SNF rehab  Status is: Inpatient   Disposition: The patient is from: ALF              Anticipated d/c is to: SNF--- memory care unit              Anticipated d/c date is: 1 day              Patient currently is not medically stable to d/c. Barriers: Not Clinically Stable-   Code Status :  -  Code Status: Limited: Do not attempt resuscitation (DNR) -DNR-LIMITED -Do Not Intubate/DNI    Family Communication:    (patient is alert, awake and coherent)   DVT Prophylaxis  :   - SCDs  SCDs Start: 05/07/23 0240 apixaban  (ELIQUIS ) tablet 5 mg   Lab Results  Component Value Date   PLT 142 (L) 05/07/2023   Inpatient Medications  Scheduled Meds:  allopurinol   300 mg Oral Daily   apixaban   5 mg  Oral BID   donepezil   10 mg Oral Daily   feeding supplement  237 mL Oral BID BM   Continuous Infusions:  cefTRIAXone  (ROCEPHIN )  IV Stopped (05/07/23 2214)   PRN Meds:.acetaminophen , albuterol    Anti-infectives (From admission, onward)    Start     Dose/Rate Route Frequency Ordered Stop   05/07/23 2100  cefTRIAXone  (ROCEPHIN ) 1 g in sodium chloride  0.9 % 100 mL IVPB        1 g 200 mL/hr over 30 Minutes Intravenous Every 24 hours 05/07/23 0233     05/06/23 2000  cefTRIAXone  (ROCEPHIN ) 1 g in sodium chloride  0.9 % 100 mL IVPB        1 g 200 mL/hr over 30 Minutes Intravenous  Once 05/06/23 1958 05/06/23 2116      Subjective: [name] today has no fevers, no emesis,  No chest pain,   - Fatigue and generalized weakness persist -cooperative  Objective: Vitals:   05/07/23 1332 05/07/23 2002 05/08/23 0441 05/08/23 1517  BP: (!) 139/56 (!) 132/52 (!) 140/56 131/79  Pulse: (!) 59 66 64 72  Resp: 17 18 18 18   Temp: 97.9 F (36.6 C) 98 F (36.7 C) 97.7 F (36.5 C) (!) 97.4 F (36.3 C)  TempSrc:  Oral Oral Axillary  SpO2: 95% 92% 95% 94%  Weight:      Height:  Intake/Output Summary (Last 24 hours) at 05/08/2023 1742 Last data filed at 05/08/2023 1531 Gross per 24 hour  Intake 299.26 ml  Output 1350 ml  Net -1050.74 ml   Filed Weights   05/06/23 1256 05/06/23 2327  Weight: 61.2 kg 64.9 kg    Physical Exam  Gen:- Awake Alert, in no apparent distress, pleasantly confused HEENT:- [place].AT, No sclera icterus Neck-Supple Neck,No JVD,.  Lungs-  CTAB , fair symmetrical air movement CV- S1, S2 normal, regular , Rt sided pacemaker Abd-  +ve B.Sounds, Abd Soft, No tenderness, no CVA area tenderness Extremity/Skin:- No  edema, pedal pulses present  Psych-pleasantly confused, obvious cognitive and memory deficits, consistent with underlying dementia  -Neuro-generalized weakness, no new focal deficits, no tremors  Data Reviewed: I have personally reviewed following  labs and imaging studies  CBC: Recent Labs  Lab 05/06/23 2033 05/07/23 0502  WBC 6.1 6.1  NEUTROABS 4.3  --   HGB 12.2 12.5  HCT 37.3 37.8  MCV 105.1* 105.3*  PLT 154 142*   Basic Metabolic Panel: Recent Labs  Lab 05/06/23 2033 05/07/23 0502  NA 137 137  K 4.0 3.8  CL 104 104  CO2 25 25  GLUCOSE 86 78  BUN 12 11  CREATININE 0.77 0.71  CALCIUM 8.5* 8.5*  MG 2.0 2.0  PHOS  --  3.8   GFR: Estimated Creatinine Clearance: 42.4 mL/min (by C-G formula based on SCr of 0.71 mg/dL). Liver Function Tests: Recent Labs  Lab 05/06/23 2033 05/07/23 0502  AST 18 18  ALT 14 14  ALKPHOS 60 60  BILITOT 1.2 1.1  PROT 6.4* 6.4*  ALBUMIN 2.8* 2.8*   Recent Results (from the past 240 hours)  Urine Culture     Status: Abnormal (Preliminary result)   Collection Time: 05/06/23  7:29 PM   Specimen: Urine, Clean Catch  Result Value Ref Range Status   Specimen Description   Final    URINE, CLEAN CATCH Performed at [facility], [address], [place], [place] [address]    Special Requests   Final    NONE Performed at [facility], [address], [place], [place] [address]    Culture (A)  Final    >=100,000 COLONIES/mL ESCHERICHIA COLI SUSCEPTIBILITIES TO FOLLOW Performed at [facility], [address] [place], [place] [address]    Report Status PENDING  Incomplete  MRSA Next Gen by PCR, Nasal     Status: None   Collection Time: 05/06/23 11:46 PM   Specimen: Nasal Mucosa; Nasal Swab  Result Value Ref Range Status   MRSA by PCR Next Gen NOT DETECTED NOT DETECTED Final    Comment: (NOTE) The GeneXpert MRSA Assay (FDA approved for NASAL specimens only), is one component of a comprehensive MRSA colonization surveillance program. It is not intended to diagnose MRSA infection nor to guide or monitor treatment for MRSA infections. Test performance is not FDA approved in patients less than 21 years old. Performed at [facility], [address], [place], [place] [address]      Radiology Studies: No results found.  Scheduled Meds:  allopurinol   300 mg Oral Daily   apixaban   5 mg Oral BID   donepezil   10 mg Oral Daily   feeding supplement  237 mL Oral BID BM   Continuous Infusions:  cefTRIAXone  (ROCEPHIN )  IV Stopped (05/07/23 2214)    LOS: 2 days   Rendall Carwin M.D on 05/08/2023 at 5:42 PM  Go to [email or web address] - for contact  info  [facility] - Office  [phone]  If 7PM-7AM, please contact night-coverage [email or web address] 05/08/2023, 5:42 PM

## 2023-05-08 NOTE — TOC Progression Note (Signed)
 Transition of Care [facility]) - Progression Note    Patient Details  Name: ASMARA BACKS MRN: [id number] Date of Birth: 11-Nov-1925  [facility] CM/SW Contact  Noreen KATHEE [place], [place] Phone Number: 05/08/2023, 3:21 PM  Clinical Narrative:    Patient was accepted to [facility]. Shara was started today. Niece agreeable and accepted bed offer. Debbie at [facility] made aware TOC will continue to follow.    Expected Discharge Plan: Skilled Nursing Facility Barriers to Discharge: Continued Medical Work up  Expected Discharge Plan and Services In-house Referral: Clinical Social Work   Post Acute Care Choice: Skilled Nursing Facility Living arrangements for the past 2 months: Assisted Living Facility (Adult Care Home)                    Social Determinants of Health (SDOH) Interventions SDOH Screenings   Food Insecurity: Patient Unable To Answer (05/06/2023)  Housing: Patient Unable To Answer (05/06/2023)  Transportation Needs: Patient Unable To Answer (05/06/2023)  Utilities: Patient Unable To Answer (05/06/2023)  Depression (PHQ2-9): Low Risk  (08/27/2019)  Social Connections: Patient Unable To Answer (05/06/2023)  Tobacco Use: Medium Risk (05/06/2023)    Readmission Risk Interventions    05/07/2023    1:16 PM  Readmission Risk Prevention Plan  Medication Screening Complete  Transportation Screening Complete

## 2023-05-08 NOTE — Plan of Care (Signed)
  Problem: Acute Rehab PT Goals(only PT should resolve) Goal: Pt Will Go Supine/Side To Sit Outcome: Progressing Flowsheets (Taken 05/08/2023 1220) Pt will go Supine/Side to Sit: with contact guard assist Goal: Pt Will Go Sit To Supine/Side Outcome: Progressing Flowsheets (Taken 05/08/2023 1220) Pt will go Sit to Supine/Side: with contact guard assist Goal: Patient Will Transfer Sit To/From Stand Outcome: Progressing Flowsheets (Taken 05/08/2023 1220) Patient will transfer sit to/from stand: with minimal assist Goal: Pt Will Ambulate Outcome: Progressing Flowsheets (Taken 05/08/2023 1220) Pt will Ambulate:  25 feet  with minimal assist  with rolling walker Goal: Pt/caregiver will Perform Home Exercise Program Outcome: Progressing Flowsheets (Taken 05/08/2023 1220) Pt/caregiver will Perform Home Exercise Program:  For increased strengthening  For improved balance  With Supervision, verbal cues required/provided  12:21 PM, 05/08/23 Prentice CANDIE Stains PT, DPT Physical Therapist at [facility]

## 2023-05-09 DIAGNOSIS — N3080 Other cystitis without hematuria: Secondary | ICD-10-CM | POA: Diagnosis not present

## 2023-05-09 LAB — URINE CULTURE: Culture: >=100000 — AB

## 2023-05-09 LAB — GLUCOSE, CAPILLARY: Glucose-Capillary: 93 mg/dL (ref 70–99)

## 2023-05-09 MED ORDER — METOPROLOL SUCCINATE ER 50 MG PO TB24
50.0000 mg | ORAL_TABLET | Freq: Every day | ORAL | Status: DC
Start: 2023-05-09 — End: 2023-05-10
  Administered 2023-05-09 – 2023-05-10 (×2): 50 mg via ORAL
  Filled 2023-05-09 (×2): qty 1

## 2023-05-09 NOTE — TOC Progression Note (Signed)
 Transition of Care [facility]) - Progression Note    Patient Details  Name: [name] MRN: [id number] Date of Birth: Dec 07, 1925  Transition [facility] Care [facility]) CM/SW Contact  Noreen KATHEE [place], [place] Phone Number: 05/09/2023, 1:09 PM  Clinical Narrative:     CSW checked Auth status and it still currently pending. TOC will continue to follow.   Expected Discharge Plan: Skilled Nursing Facility Barriers to Discharge: Continued Medical Work up  Expected Discharge Plan and Services In-house Referral: Clinical Social Work   Post Acute Care Choice: Skilled Nursing Facility Living arrangements for the past 2 months: Assisted Living Facility (Adult Care Home)                                       Social Determinants of Health (SDOH) Interventions SDOH Screenings   Food Insecurity: Patient Unable To Answer (05/06/2023)  Housing: Patient Unable To Answer (05/06/2023)  Transportation Needs: Patient Unable To Answer (05/06/2023)  Utilities: Patient Unable To Answer (05/06/2023)  Depression (PHQ2-9): Low Risk  (08/27/2019)  Social Connections: Patient Unable To Answer (05/06/2023)  Tobacco Use: Medium Risk (05/06/2023)    Readmission Risk Interventions    05/07/2023    1:16 PM  Readmission Risk Prevention Plan  Medication Screening Complete  Transportation Screening Complete

## 2023-05-09 NOTE — Plan of Care (Signed)
  Problem: Activity: Goal: Risk for activity intolerance will decrease Outcome: Progressing   Problem: Coping: Goal: Level of anxiety will decrease Outcome: Progressing   Problem: Pain Management: Goal: General experience of comfort will improve Outcome: Progressing

## 2023-05-09 NOTE — Progress Notes (Signed)
 Mobility Specialist Progress Note:    05/09/23 1432  Mobility  Activity Transferred from bed to chair;Stood at bedside;Dangled on edge of bed  Level of Assistance Moderate assist, patient does 50-74%  Assistive Device Front wheel walker  Distance Ambulated (ft) 4 ft  Range of Motion/Exercises Active;All extremities  Activity Response Tolerated well  Mobility Referral Yes  Mobility visit 1 Mobility  Mobility Specialist Start Time (ACUTE ONLY) 1345  Mobility Specialist Stop Time (ACUTE ONLY) 1410  Mobility Specialist Time Calculation (min) (ACUTE ONLY) 25 min   Pt received in bed, agreeable to mobility. Required ModA to stand and transfer with RW. NT in room to help with transfer. Tolerated well, asx throughout. Left pt in chair, alarm on and call bell in hand. Family at bedside, all needs met.   Sherrilee Ditty Mobility Specialist Please contact via Special Educational Needs Teacher or  Rehab office at [phone]

## 2023-05-09 NOTE — Progress Notes (Addendum)
 PROGRESS NOTE     [name], is a 89 y.o. female, DOB - 1925/05/31, FMW:[id number]  Admit date - 05/06/2023   Admitting Physician Posey Maier, DO  Outpatient Primary MD for the patient is [name], FNP  LOS - 3  Chief Complaint  Patient presents with   Hip Pain       Brief Narrative:  89 y.o. female with medical history significant of hypertension, CHF, chronic hip pain, COPD, gout, permanent atrial fibrillation, dementia admitted with acute metabolic encephalopathy in the setting of presumed UTI - -Remains medically stable for discharge to SNF facility when bed is available and insurance authorization is obtained    -Assessment and Plan: 1)E coli UTI---CT abdomen and pelvis was suggestive of emphysematous cystitis  -History of recurrent UTI- -Urine cx from 05/06/23 with E coli -c/n IV Rocephin  per sensitivity data, may discharge on Keflex    2)Dementia with significant cognitive and memory deficits -- Continue Aricept  -Supportive care  3)Chronic atrial fibrillation/history of tachybradycardia syndrome-----pacemaker in situ - discontinued digoxin  at this 89 year old female -Continue metoprolol  for rate control -Continue Eliquis  for stroke prophylaxis, also patient has history of prior PE  4)H/o Gout--- continue allopurinol   5) chronic diastolic dysfunction CHF---no acute exacerbation - Left Ventricular Ejection Fraction: 72 % 2015 NM study - no previous ECHO - home medications:  Digoxin  and Metoprolol  --Will discontinue digoxin  89 year old -Okay to continue metoprolol  -6)Generalized weakness and deconditioning--PT eval appreciated recommends SNF rehab  Status is: Inpatient   Disposition: The patient is from: ALF              Anticipated d/c is to: SNF--- memory care unit              Anticipated d/c date is: 1 day              Patient currently is medically stable to d/c. Barriers: - -Remains medically stable for discharge to SNF facility  when bed is available and insurance authorization is obtained  Code Status :  -  Code Status: Limited: Do not attempt resuscitation (DNR) -DNR-LIMITED -Do Not Intubate/DNI    Family Communication:    (patient is alert, awake and coherent)   DVT Prophylaxis  :   - SCDs  SCDs Start: 05/07/23 0240 apixaban  (ELIQUIS ) tablet 5 mg   Lab Results  Component Value Date   PLT 142 (L) 05/07/2023   Inpatient Medications  Scheduled Meds:  allopurinol   300 mg Oral Daily   apixaban   5 mg Oral BID   donepezil   10 mg Oral Daily   feeding supplement  237 mL Oral BID BM   Continuous Infusions:  cefTRIAXone  (ROCEPHIN )  IV Stopped (05/08/23 2159)   PRN Meds:.acetaminophen , albuterol    Anti-infectives (From admission, onward)    Start     Dose/Rate Route Frequency Ordered Stop   05/07/23 2100  cefTRIAXone  (ROCEPHIN ) 1 g in sodium chloride  0.9 % 100 mL IVPB        1 g 200 mL/hr over 30 Minutes Intravenous Every 24 hours 05/07/23 0233     05/06/23 2000  cefTRIAXone  (ROCEPHIN ) 1 g in sodium chloride  0.9 % 100 mL IVPB        1 g 200 mL/hr over 30 Minutes Intravenous  Once 05/06/23 1958 05/06/23 2116      Subjective: [name] today has no fevers, no emesis,  No chest pain,   - Fatigue and generalized weakness persist -Oral intake is fair -cooperative, pleasantly confused and forgetful - -Remains medically  stable for discharge to SNF facility when bed is available and insurance authorization is obtained  Objective: Vitals:   05/08/23 1517 05/08/23 1939 05/09/23 0407 05/09/23 1445  BP: 131/79 139/86 135/76 133/68  Pulse: 72 71 91 (!) 104  Resp: 18 18 18 18   Temp: (!) 97.4 F (36.3 C) 97.6 F (36.4 C) 97.8 F (36.6 C)   TempSrc: Axillary Axillary Oral   SpO2: 94% 97% 96% (!) 83%  Weight:      Height:        Intake/Output Summary (Last 24 hours) at 05/09/2023 1531 Last data filed at 05/09/2023 1400 Gross per 24 hour  Intake 581.58 ml  Output 600 ml  Net -18.42 ml   Filed  Weights   05/06/23 1256 05/06/23 2327  Weight: 61.2 kg 64.9 kg    Physical Exam  Gen:- Awake Alert, in no apparent distress, pleasantly confused HEENT:- [place].AT, No sclera icterus Neck-Supple Neck,No JVD,.  Lungs-  CTAB , fair symmetrical air movement CV- S1, S2 normal, regular , Rt sided pacemaker Abd-  +ve B.Sounds, Abd Soft, No tenderness, no CVA area tenderness Extremity/Skin:- No  edema, pedal pulses present  Psych-pleasantly confused, forgetful,  obvious cognitive and memory deficits, consistent with underlying dementia  -Neuro-generalized weakness, no new focal deficits, no tremors  Data Reviewed: I have personally reviewed following labs and imaging studies  CBC: Recent Labs  Lab 05/06/23 2033 05/07/23 0502  WBC 6.1 6.1  NEUTROABS 4.3  --   HGB 12.2 12.5  HCT 37.3 37.8  MCV 105.1* 105.3*  PLT 154 142*   Basic Metabolic Panel: Recent Labs  Lab 05/06/23 2033 05/07/23 0502  NA 137 137  K 4.0 3.8  CL 104 104  CO2 25 25  GLUCOSE 86 78  BUN 12 11  CREATININE 0.77 0.71  CALCIUM 8.5* 8.5*  MG 2.0 2.0  PHOS  --  3.8   GFR: Estimated Creatinine Clearance: 42.4 mL/min (by C-G formula based on SCr of 0.71 mg/dL). Liver Function Tests: Recent Labs  Lab 05/06/23 2033 05/07/23 0502  AST 18 18  ALT 14 14  ALKPHOS 60 60  BILITOT 1.2 1.1  PROT 6.4* 6.4*  ALBUMIN 2.8* 2.8*   Recent Results (from the past 240 hours)  Urine Culture     Status: Abnormal   Collection Time: 05/06/23  7:29 PM   Specimen: Urine, Clean Catch  Result Value Ref Range Status   Specimen Description   Final    URINE, CLEAN CATCH Performed at [facility], [address] [place], [place] [address]    Special Requests   Final    NONE Performed at [facility], [address], [place], [place] [address]    Culture >=100,000 COLONIES/mL ESCHERICHIA COLI (A)  Final   Report Status 05/09/2023 FINAL  Final   Organism ID, Bacteria ESCHERICHIA COLI (A)  Final      Susceptibility    Escherichia coli - MIC*    AMPICILLIN >=32 RESISTANT Resistant     CEFAZOLIN  8 SENSITIVE Sensitive     CEFEPIME <=0.12 SENSITIVE Sensitive     CEFTRIAXONE  <=0.25 SENSITIVE Sensitive     CIPROFLOXACIN  >=4 RESISTANT Resistant     GENTAMICIN  <=1 SENSITIVE Sensitive     IMIPENEM <=0.25 SENSITIVE Sensitive     NITROFURANTOIN  256 RESISTANT Resistant     TRIMETH /SULFA  <=20 SENSITIVE Sensitive     AMPICILLIN/SULBACTAM >=32 RESISTANT Resistant     PIP/TAZO 8 SENSITIVE Sensitive ug/mL    * >=100,000 COLONIES/mL ESCHERICHIA COLI  MRSA Next Gen by PCR, Nasal     Status: None   Collection Time: 05/06/23 11:46 PM   Specimen: Nasal Mucosa; Nasal Swab  Result Value Ref Range Status   MRSA by PCR Next Gen NOT DETECTED NOT DETECTED Final    Comment: (NOTE) The GeneXpert MRSA Assay (FDA approved for NASAL specimens only), is one component of a comprehensive MRSA colonization surveillance program. It is not intended to diagnose MRSA infection nor to guide or monitor treatment for MRSA infections. Test performance is not FDA approved in patients less than 42 years old. Performed at [facility], [address] [place], [place] [address]     Radiology Studies: No results found.  Scheduled Meds:  allopurinol   300 mg Oral Daily   apixaban   5 mg Oral BID   donepezil   10 mg Oral Daily   feeding supplement  237 mL Oral BID BM   Continuous Infusions:  cefTRIAXone  (ROCEPHIN )  IV Stopped (05/08/23 2159)    LOS: 3 days   Rendall Carwin M.D on 05/09/2023 at 3:31 PM  Go to [email or web address] - for contact info  [facility] - Office  [phone]  If 7PM-7AM, please contact night-coverage [email or web address] 05/09/2023, 3:31 PM

## 2023-05-09 NOTE — Plan of Care (Signed)

## 2023-05-10 DIAGNOSIS — N3080 Other cystitis without hematuria: Secondary | ICD-10-CM | POA: Diagnosis not present

## 2023-05-10 MED ORDER — CEPHALEXIN 500 MG PO CAPS: 500.0000 mg | ORAL_CAPSULE | Freq: Three times a day (TID) | ORAL | 0 refills | Status: AC

## 2023-05-10 MED ORDER — METOPROLOL SUCCINATE ER 50 MG PO TB24: 50.0000 mg | ORAL_TABLET | Freq: Every day | ORAL | 0 refills | Status: AC

## 2023-05-10 NOTE — Progress Notes (Signed)
 Report called to [facility] LPN.

## 2023-05-10 NOTE — Progress Notes (Signed)
 AVS reviewed, all personal belongings were returned.   All questions answered.

## 2023-05-10 NOTE — Care Management Important Message (Signed)
 Important Message  Patient Details  Name: [name] MRN: [id number] Date of Birth: 05/16/1925   Important Message Given:  Yes - Medicare IM (spoke with niece Suzen [place] at [phone] to review letter, no additional copy needed)     Geoge Lawrance L [name] 05/10/2023, 10:50 AM

## 2023-05-10 NOTE — TOC Transition Note (Addendum)
 Transition of Care [facility]) - Discharge Note   Patient Details  Name: [name] MRN: [id number] Date of Birth: 1925-05-20  Transition of Care [facility]) CM/SW Contact:  Noreen KATHEE Cleotilde ISRAEL Phone Number: 05/10/2023, 9:26 AM   Clinical Narrative:    Patient Auth came back . CSW spoke with Debbie at [employer]. Patient will go to room A20-2 and her bed will be ready after 10:30 AM. Call report number given to nurse. Med necessity form completed. Will contact [facility] once ready. CSW called niece and made her aware. TOC signing off.    [facility] called.   Final next level of care: Skilled Nursing Facility Barriers to Discharge: Barriers Resolved   Patient Goals and CMS Choice Patient states their goals for this hospitalization and ongoing recovery are:: DC to [facility] Compare Post Acute Care list provided to:: Patient Represenative (must comment) (Niece kimberly) Choice offered to / list presented to : Adult Children      Discharge Placement                Patient to be transferred to facility by: Ambulance Name of family member notified: Suzen Patient and family notified of of transfer: 05/10/23  Discharge Plan and Services Additional resources added to the After Visit Summary for   In-house Referral: Clinical Social Work   Post Acute Care Choice: Skilled Nursing Facility                               Social Drivers of Health (SDOH) Interventions SDOH Screenings   Food Insecurity: Patient Unable To Answer (05/06/2023)  Housing: Patient Unable To Answer (05/06/2023)  Transportation Needs: Patient Unable To Answer (05/06/2023)  Utilities: Patient Unable To Answer (05/06/2023)  Depression (PHQ2-9): Low Risk  (08/27/2019)  Social Connections: Patient Unable To Answer (05/06/2023)  Tobacco Use: Medium Risk (05/06/2023)     Readmission Risk Interventions    05/10/2023    9:24 AM 05/07/2023    1:16 PM  Readmission Risk Prevention  Plan  Medication Screening Complete Complete  Transportation Screening Complete Complete

## 2023-05-10 NOTE — Plan of Care (Signed)

## 2023-05-10 NOTE — Plan of Care (Signed)
   Problem: Clinical Measurements: Goal: Respiratory complications will improve Outcome: Progressing

## 2023-05-10 NOTE — Progress Notes (Addendum)
 Pt removed two IV's overnight. IV replaced x 2. She remains pleasantly confused. She c/o left hip/leg pain and PRN Tylenol was administered. Noa cute events overnight. Kellogg RN

## 2023-05-10 NOTE — Progress Notes (Signed)
 Attempted to call report X2 [phone].

## 2023-05-10 NOTE — Discharge Summary (Signed)
 Physician Discharge Summary   Patient: [name] MRN: [id number] DOB: Sep 27, 1925  Admit date:     05/06/2023  Discharge date: 05/10/23  Discharge Physician: Adriana DELENA Grams   PCP: [name], FNP   Recommendations at discharge:  Follow Up with PCP in 1 week  Continue Antibiotics, Oral Hydration   Discharge Diagnoses: Principal Problem:   Emphysematous cystitis Active Problems:   Essential hypertension, benign   ATRIAL FIBRILLATION, CHRONIC   Dementia with behavioral disturbance (HCC)   Generalized weakness   Bilateral hip pain   Hypoalbuminemia due to protein-calorie malnutrition (HCC)   Gout  Resolved Problems:   * No resolved hospital problems. *  [name] is a 89 y.o. female with medical history significant of hypertension, CHF, chronic hip pain, COPD, gout, permanent atrial fibrillation, dementia admitted with acute metabolic encephalopathy in the setting of presumed UTI - -Remains medically stable for discharge       1)E coli UTI---CT abdomen and pelvis was suggestive of emphysematous cystitis  -History of recurrent UTI- -Urine cx from 05/06/23 with E coli -c/n IV Rocephin  per sensitivity data, may discharge on Keflex     2)Dementia with significant cognitive and memory deficits -- Continue Aricept  -Supportive care   3)Chronic atrial fibrillation/history of tachybradycardia syndrome-----pacemaker in situ - discontinued digoxin  at this 89 year old female -Continue metoprolol  for rate control -Continue Eliquis  for stroke prophylaxis, also patient has history of prior PE   4)H/o Gout--- continue allopurinol    5) chronic diastolic dysfunction CHF---no acute exacerbation - Left Ventricular Ejection Fraction: 72 % 2015 NM study - no previous ECHO - home medications:  Digoxin  and Metoprolol  --Will discontinue digoxin  89 year old -Okay to continue metoprolol  -6)Generalized weakness and deconditioning--PT eval appreciated recommends  SNF rehab      Disposition: The patient is from: ALF              Anticipated d/c is to: SNF--- memory care unit                Code Status :  -  Code Status: Limited: Do not attempt resuscitation (DNR) -DNR-LIMITED -Do Not Intubate/DNI        Disposition: Skilled nursing facility Diet recommendation:  Discharge Diet Orders (From admission, onward)     Start     Ordered   05/10/23 0000  Diet - low sodium heart healthy        05/10/23 0727           Regular diet DISCHARGE MEDICATION: Allergies as of 05/10/2023       Reactions   Bee Venom Swelling   Codeine    REACTION: GI distress   Lorazepam Other (See Comments)   Really bad hallucinations, keeps her awake        Medication List     STOP taking these medications    ascorbic acid 500 MG tablet Commonly known as: VITAMIN C   digoxin  0.125 MG tablet Commonly known as: LANOXIN    nitrofurantoin  100 MG capsule Commonly known as: MACRODANTIN        TAKE these medications    acetaminophen  325 MG tablet Commonly known as: Tylenol  Take 2 tablets (650 mg total) by mouth every 6 (six) hours as needed. What changed: reasons to take this   albuterol  108 (90 Base) MCG/ACT inhaler Commonly known as: VENTOLIN  HFA Inhale 1-2 puffs into the lungs every 6 (six) hours as needed for wheezing or shortness of breath.   allopurinol  300 MG tablet Commonly known as: ZYLOPRIM  Take  1 tablet (300 mg total) by mouth daily.   cephALEXin  500 MG capsule Commonly known as: KEFLEX  Take 1 capsule (500 mg total) by mouth 3 (three) times daily for 6 days.   D-Mannose 500 MG Caps 2 tabs twice daily What changed:  how much to take how to take this when to take this additional instructions   donepezil  10 MG tablet Commonly known as: ARICEPT  Take 10 mg by mouth daily.   Eliquis  5 MG Tabs tablet Generic drug: apixaban  TAKE (1) TABLET BY MOUTH TWICE DAILY.   metoprolol  succinate 50 MG 24 hr tablet Commonly known as:  TOPROL -XL Take 1 tablet (50 mg total) by mouth daily. Take with or immediately following a meal. What changed:  medication strength See the new instructions.   QC COUGH/COLD HBP PO Take 1 tablet by mouth 2 (two) times daily as needed (cough/congestion).        Discharge Exam: Filed Weights   05/06/23 1256 05/06/23 2327  Weight: 61.2 kg 64.9 kg        General:  AAO x 1,  cooperative, no distress;   HEENT:  Normocephalic, PERRL, otherwise with in Normal limits   Neuro:  CNII-XII intact. , normal motor and sensation, reflexes intact   Lungs:   Clear to auscultation BL, Respirations unlabored,  No wheezes / crackles  Cardio:    S1/S2, RRR, No murmure, No Rubs or Gallops   Abdomen:  Soft, non-tender, bowel sounds active all four quadrants, no guarding or peritoneal signs.  Muscular  skeletal:  Limited exam -global generalized weaknesses - in bed, able to move all 4 extremities,   2+ pulses,  symmetric, No pitting edema  Skin:  Dry, warm to touch, negative for any Rashes,  Wounds: Please see nursing documentation          Condition at discharge: fair  The results of significant diagnostics from this hospitalization (including imaging, microbiology, ancillary and laboratory) are listed below for reference.   Imaging Studies: CT PELVIS WO CONTRAST Result Date: 05/06/2023 CLINICAL DATA:  Worsening pain in left hip. Unable to tolerate pain. EXAM: CT PELVIS WITHOUT CONTRAST TECHNIQUE: Multidetector CT imaging of the pelvis was performed following the standard protocol without intravenous contrast. RADIATION DOSE REDUCTION: This exam was performed according to the departmental dose-optimization program which includes automated exposure control, adjustment of the mA and/or kV according to patient size and/or use of iterative reconstruction technique. COMPARISON:  Same day hip radiograph and CT abdomen pelvis 11/29/2022 FINDINGS: Urinary Tract: Bladder wall thickening greatest  anteriorly. Small locule of gas within the bladder. Left pelvic kidney. Bowel: Extensive diverticulosis of the visualized colon. No evidence of diverticulitis. Vascular/Lymphatic: Arterial atherosclerotic calcification. No lymphadenopathy. Reproductive:  No acute abnormality. Other:  Small volume free fluid in the pelvis. Musculoskeletal: Demineralization. Age indeterminate buckle fracture of the anterior cortex of S3, new since 11/29/2022. No hip fracture or dislocation. Advanced osteoarthritis of both hips greater on the left where there is complete loss of joint space superiorly with subchondral cystic change in the acetabulum and femoral head. IMPRESSION: 1. Age indeterminate buckle fracture of the anterior cortex of S3, new since 11/29/2022. 2. No hip fracture or dislocation. 3. Advanced osteoarthritis of both hips greater on the left where there is complete loss of joint space superiorly. 4. Bladder wall thickening greatest anteriorly and small locules of gas in the bladder. Correlate with urinalysis to exclude cystitis. Electronically Signed   By: Norman Gatlin M.D.   On: 05/06/2023 19:04  DG Hip Unilat With Pelvis 2-3 Views Left Result Date: 05/06/2023 CLINICAL DATA:  Left hip pain, worsening, no reported injury EXAM: DG HIP (WITH OR WITHOUT PELVIS) 2-3V LEFT COMPARISON:  01/30/2023 pelvic and left hip radiographs FINDINGS: No pelvic fracture or diastasis. No left hip fracture or dislocation. Severe left hip osteoarthritis with subchondral sclerosis and cystic change and partial articular collapse with bulky marginal acetabular osteophyte, not definitely changed. Moderate to severe right hip osteoarthritis, unchanged. Marked degenerative disc disease in the visualized lower lumbar spine. No suspicious focal osseous lesions. IMPRESSION: 1. No acute osseous abnormality. 2. Severe left hip osteoarthritis, not definitely changed. 3. Moderate to severe right hip osteoarthritis, unchanged. 4. Marked  degenerative disc disease in the visualized lower lumbar spine. Electronically Signed   By: Selinda DELENA Blue M.D.   On: 05/06/2023 14:49    Microbiology: Results for orders placed or performed during the hospital encounter of 05/06/23  Urine Culture     Status: Abnormal   Collection Time: 05/06/23  7:29 PM   Specimen: Urine, Clean Catch  Result Value Ref Range Status   Specimen Description   Final    URINE, CLEAN CATCH Performed at [facility], [address], [place], [place] [address]    Special Requests   Final    NONE Performed at [facility], [address] [place], [place] [address]    Culture >=100,000 COLONIES/mL ESCHERICHIA COLI (A)  Final   Report Status 05/09/2023 FINAL  Final   Organism ID, Bacteria ESCHERICHIA COLI (A)  Final      Susceptibility   Escherichia coli - MIC*    AMPICILLIN >=32 RESISTANT Resistant     CEFAZOLIN  8 SENSITIVE Sensitive     CEFEPIME <=0.12 SENSITIVE Sensitive     CEFTRIAXONE  <=0.25 SENSITIVE Sensitive     CIPROFLOXACIN  >=4 RESISTANT Resistant     GENTAMICIN  <=1 SENSITIVE Sensitive     IMIPENEM <=0.25 SENSITIVE Sensitive     NITROFURANTOIN  256 RESISTANT Resistant     TRIMETH /SULFA  <=20 SENSITIVE Sensitive     AMPICILLIN/SULBACTAM >=32 RESISTANT Resistant     PIP/TAZO 8 SENSITIVE Sensitive ug/mL    * >=100,000 COLONIES/mL ESCHERICHIA COLI  MRSA Next Gen by PCR, Nasal     Status: None   Collection Time: 05/06/23 11:46 PM   Specimen: Nasal Mucosa; Nasal Swab  Result Value Ref Range Status   MRSA by PCR Next Gen NOT DETECTED NOT DETECTED Final    Comment: (NOTE) The GeneXpert MRSA Assay (FDA approved for NASAL specimens only), is one component of a comprehensive MRSA colonization surveillance program. It is not intended to diagnose MRSA infection nor to guide or monitor treatment for MRSA infections. Test performance is not FDA approved in patients less than 59 years old. Performed at [facility], [address], [place], [place] [address]      Labs: CBC: Recent Labs  Lab 05/06/23 2033 05/07/23 0502  WBC 6.1 6.1  NEUTROABS 4.3  --   HGB 12.2 12.5  HCT 37.3 37.8  MCV 105.1* 105.3*  PLT 154 142*   Basic Metabolic Panel: Recent Labs  Lab 05/06/23 2033 05/07/23 0502  NA 137 137  K 4.0 3.8  CL 104 104  CO2 25 25  GLUCOSE 86 78  BUN 12 11  CREATININE 0.77 0.71  CALCIUM 8.5* 8.5*  MG 2.0 2.0  PHOS  --  3.8   Liver Function Tests: Recent Labs  Lab 05/06/23 2033 05/07/23 0502  AST 18 18  ALT 14 14  ALKPHOS 60 60  BILITOT 1.2 1.1  PROT 6.4* 6.4*  ALBUMIN 2.8* 2.8*   CBG: Recent Labs  Lab 05/09/23 2240  GLUCAP 93    Discharge time spent: greater than 30 minutes.  Signed: Adriana DELENA Grams, MD [facility] 05/10/2023

## 2023-05-21 ENCOUNTER — Telehealth: Payer: Self-pay | Admitting: Internal Medicine

## 2023-05-21 NOTE — Telephone Encounter (Signed)
 Vonda with [facility] reviewed discharge paperwork and they do not have an arrival time for 1/22 PPM gen change. Please advise. Waldron Session mentions that she will be in a meeting from 9:30-10:00 AM.   Phone#: [phone] (ext#: [phone])

## 2023-05-21 NOTE — Telephone Encounter (Signed)
 Spoke with [place] and gave her date, time and address for procedure. Vonda confirmed information.

## 2023-05-23 ENCOUNTER — Other Ambulatory Visit: Payer: Self-pay | Admitting: Internal Medicine

## 2023-05-23 DIAGNOSIS — I4891 Unspecified atrial fibrillation: Secondary | ICD-10-CM

## 2023-05-23 NOTE — Telephone Encounter (Signed)
Eliquis 5mg  refill request received. Patient is 89 years old, weight-64.9kg, Crea-0.71 on 05/07/23, Diagnosis-Afib, and last seen by Dr. Ladona Ridgel on 02/19/23. Dose is appropriate based on dosing criteria. Will send in refill to requested pharmacy.

## 2023-05-29 ENCOUNTER — Ambulatory Visit (HOSPITAL_COMMUNITY)
Admission: RE | Admit: 2023-05-29 | Discharge: 2023-05-29 | Disposition: A | Discharge status: Home / Self Care | Payer: Medicare Other | Attending: Internal Medicine | Admitting: Internal Medicine

## 2023-05-29 ENCOUNTER — Other Ambulatory Visit: Payer: Self-pay

## 2023-05-29 ENCOUNTER — Encounter (HOSPITAL_COMMUNITY)
Admission: RE | Disposition: A | Discharge status: Home / Self Care | Payer: Medicare Other | Source: Home / Self Care | Attending: Internal Medicine

## 2023-05-29 DIAGNOSIS — Z87891 Personal history of nicotine dependence: Secondary | ICD-10-CM | POA: Diagnosis not present

## 2023-05-29 DIAGNOSIS — I10 Essential (primary) hypertension: Secondary | ICD-10-CM | POA: Insufficient documentation

## 2023-05-29 DIAGNOSIS — I442 Atrioventricular block, complete: Secondary | ICD-10-CM | POA: Insufficient documentation

## 2023-05-29 DIAGNOSIS — I4821 Permanent atrial fibrillation: Secondary | ICD-10-CM | POA: Diagnosis not present

## 2023-05-29 DIAGNOSIS — Z7901 Long term (current) use of anticoagulants: Secondary | ICD-10-CM | POA: Diagnosis not present

## 2023-05-29 DIAGNOSIS — F0390 Unspecified dementia without behavioral disturbance: Secondary | ICD-10-CM | POA: Insufficient documentation

## 2023-05-29 DIAGNOSIS — Z79899 Other long term (current) drug therapy: Secondary | ICD-10-CM | POA: Diagnosis not present

## 2023-05-29 DIAGNOSIS — Z45010 Encounter for checking and testing of cardiac pacemaker pulse generator [battery]: Secondary | ICD-10-CM | POA: Insufficient documentation

## 2023-05-29 HISTORY — PX: PPM GENERATOR CHANGEOUT: EP1233

## 2023-05-29 SURGERY — PPM GENERATOR CHANGEOUT

## 2023-05-29 MED ORDER — SODIUM CHLORIDE 0.9 % IV SOLN
80.0000 mg | INTRAVENOUS | Status: AC
  Administered 2023-05-29: 80 mg

## 2023-05-29 MED ORDER — CHLORHEXIDINE GLUCONATE 4 % EX SOLN: 4.0000 | Freq: Once | CUTANEOUS | Status: DC

## 2023-05-29 MED ORDER — CEFAZOLIN SODIUM-DEXTROSE 2-4 GM/100ML-% IV SOLN
2.0000 g | INTRAVENOUS | Status: AC
  Administered 2023-05-29: 2 g via INTRAVENOUS

## 2023-05-29 MED ORDER — LIDOCAINE HCL (PF) 1 % IJ SOLN
INTRAMUSCULAR | Status: DC | PRN
  Administered 2023-05-29: 60 mL

## 2023-05-29 MED ORDER — SODIUM CHLORIDE 0.9 % IV SOLN: INTRAVENOUS | Status: DC

## 2023-05-29 MED ORDER — SODIUM CHLORIDE 0.9 % IV SOLN
INTRAVENOUS | Status: AC
  Filled 2023-05-29: qty 2

## 2023-05-29 MED ORDER — ONDANSETRON HCL 4 MG/2ML IJ SOLN: 4.0000 mg | Freq: Four times a day (QID) | INTRAMUSCULAR | Status: DC | PRN

## 2023-05-29 MED ORDER — ACETAMINOPHEN 325 MG PO TABS
325.0000 mg | ORAL_TABLET | ORAL | Status: DC | PRN
  Administered 2023-05-29: 650 mg via ORAL
  Filled 2023-05-29 (×2): qty 1

## 2023-05-29 MED ORDER — CEFAZOLIN SODIUM-DEXTROSE 2-4 GM/100ML-% IV SOLN
INTRAVENOUS | Status: AC
  Filled 2023-05-29: qty 100

## 2023-05-29 MED ORDER — LIDOCAINE HCL (PF) 1 % IJ SOLN
INTRAMUSCULAR | Status: AC
  Filled 2023-05-29: qty 60

## 2023-05-29 SURGICAL SUPPLY — 7 items
CABLE SURGICAL S-101-97-12 (CABLE) ×1 IMPLANT
IPG PACE AZUR XT SR MRI W1SR01 (Pacemaker) IMPLANT
PACE AZURE XT SR MRI W1SR01 (Pacemaker) ×1 IMPLANT
PAD DEFIB RADIO PHYSIO CONN (PAD) ×1 IMPLANT
POUCH AIGIS-R ANTIBACT PPM (Mesh General) ×1 IMPLANT
POUCH AIGIS-R ANTIBACT PPM MED (Mesh General) IMPLANT
TRAY PACEMAKER INSERTION (PACKS) ×1 IMPLANT

## 2023-05-29 NOTE — H&P (Signed)
HPI [name] returns today for followup. She is a pleasant 89 yo woman with a h/o CHB, s/p PPM, 10 years ago. In the interim, she notes no chest pain or sob or edema. She denies falls. She notes that her activity is not quite what it once was. She has developed progressive dementia. He care giver notes that her bp has been running in the 90's. No syncope or falls however.   Allergies       Allergies  Allergen Reactions   Bee Venom Swelling   Codeine        REACTION: GI distress                Current Outpatient Medications  Medication Sig Dispense Refill   allopurinol (ZYLOPRIM) 300 MG tablet Take 1 tablet (300 mg total) by mouth daily. 30 tablet 0   apixaban (ELIQUIS) 5 MG TABS tablet TAKE (1) TABLET BY MOUTH TWICE DAILY. 60 tablet 5   D-Mannose 500 MG CAPS 2 tabs twice daily (Patient taking differently: Take 1 capsule by mouth 2 (two) times daily.) 120 capsule 5   digoxin (LANOXIN) 0.125 MG tablet TAKE (1/2) TABLET BY MOUTH ONCE DAILY. 15 tablet 0   donepezil (ARICEPT) 10 MG tablet Take 10 mg by mouth daily.       fluticasone (FLONASE) 50 MCG/ACT nasal spray Place 1 spray into both nostrils daily.       metoprolol succinate (TOPROL-XL) 100 MG 24 hr tablet TAKE (1) TABLET BY MOUTH ONCE DAILY. TAKE WITH A MEAL. 30 tablet 0   nitrofurantoin (MACRODANTIN) 100 MG capsule Take 100 mg by mouth daily.       vitamin C (ASCORBIC ACID) 500 MG tablet Take 500 mg by mouth daily.          No current facility-administered medications for this visit.              Past Medical History:  Diagnosis Date   Arthritis     Bilateral chronic knee pain     Brain aneurysm     Brain aneurysm     Brain aneurysm     COPD (chronic obstructive pulmonary disease) (HCC)     DDD (degenerative disc disease), lumbar     Dementia (HCC)     DJD (degenerative joint disease)     DJD (degenerative joint disease)     DVT (deep venous thrombosis) (HCC)     Gout     Hypertension     Peripheral  neuropathy     Permanent atrial fibrillation [facility])      MDT [id number] pacemaker 03-26-2013 by Dr Ladona Ridgel   Pulmonary embolism [facility])     Renal disorder            ROS:    All systems reviewed and negative except as noted in the HPI.          Past Surgical History:  Procedure Laterality Date   CARDIAC CATHETERIZATION   2001   Craniotomy clipping   06-2002    unruptured   Left paraclinoid segment anurysum        [facility]   PACEMAKER INSERTION   09-13-1999; 03-26-2013    PPM gen change by Dr Ladona Ridgel 03-26-2013 - MDT [id number]   PERMANENT PACEMAKER GENERATOR CHANGE N/A 03/26/2013    Procedure: PERMANENT PACEMAKER GENERATOR CHANGE;  Surgeon: Marinus Maw, MD;  Location: [facility] CATH LAB;  Service: Cardiovascular;  Laterality: N/A;  Family History  Problem Relation Age of Onset   Heart attack Mother     Heart attack Father     Alzheimer's disease Sister              Social History         Socioeconomic History   Marital status: Widowed      Spouse name: Not on file   Number of children: Not on file   Years of education: Not on file   Highest education level: Not on file  Occupational History   Occupation: retired from the [employer] company  Tobacco Use   Smoking status: Former      Current packs/day: 0.00      Average packs/day: 0.3 packs/day for 35.0 years (8.8 ttl pk-yrs)      Types: Cigarettes      Start date: 12/27/1977      Quit date: 05/26/2010      Years since quitting: 12.7   Smokeless tobacco: Never  Vaping Use   Vaping status: Not on file  Substance and Sexual Activity   Alcohol use: No      Alcohol/week: 0.0 standard drinks of alcohol   Drug use: No   Sexual activity: Not on file  Other Topics Concern   Not on file  Social History Narrative   Not on file    Social Determinants of Health    Financial Resource Strain: Not on file  Food Insecurity: Not on file  Transportation Needs: Not on file  Physical  Activity: Not on file  Stress: Not on file  Social Connections: Not on file  Intimate Partner Violence: Not on file        BP 104/60   Pulse (!) 113   Ht 5\' 8"  (1.727 m)   Wt 137 lb 6.4 oz (62.3 kg)   SpO2 100%   BMI 20.89 kg/m    Physical Exam:   Well appearing NAD HEENT: Unremarkable Neck:  No JVD, no thyromegally Lymphatics:  No adenopathy Back:  No CVA tenderness Lungs:  Clear HEART:  Regular rate rhythm, no murmurs, no rubs, no clicks Abd:  soft, positive bowel sounds, no organomegally, no rebound, no guarding Ext:  2 plus pulses, no edema, no cyanosis, no clubbing Skin:  No rashes no nodules Neuro:  CN II through XII intact, motor grossly intact   DEVICE  Normal device function.  See PaceArt for details. 1 month from ERI   Assess/Plan:   HTN - she has a h/o HTN but her pressures are currently controlled. She will continue cardizem. Atrial fib - her VR is well controlled. Hopefully her rates wont increase with cessation of cardizem.  Coags - she has not had any bleeding on eliquis. PPM - her medtronic single chamber PPM is working normally. She has less than a month of battery longevity. We will schedule PPM gen change.   Dorathy Daft  EP Attending  Patient seen and examined. Her device has reached ERI. She will undergo PPM gen change.   Sharlot Gowda Verona Hartshorn,MD

## 2023-05-29 NOTE — Progress Notes (Signed)
Spoke with Dr. Ladona Ridgel who stated pressure dressing can be removed tomorrow.

## 2023-05-29 NOTE — Progress Notes (Signed)
Called report to [facility] - nurse at  [facility].

## 2023-05-29 NOTE — Discharge Instructions (Signed)

## 2023-05-30 ENCOUNTER — Encounter (HOSPITAL_COMMUNITY): Payer: Self-pay | Admitting: Internal Medicine

## 2023-06-16 NOTE — Progress Notes (Signed)
 Wound / Device Check Visit   Patient Profile: 89 y/o F with Hx of CHB s/p PPM, AF on Eliquis , HTN, progressive dementia with recent generator change on 05/29/23 by Dr. Carolynne Citron.     Device Hx:  MDT Single Chamber PPM implanted 09/13/1999 for CHB, AF Generator change 05/29/23   Exam:  Awake/alert, in NAD, talks about her job at [employer] many years ago, not oriented but non-focal / MAE, warm/dry, no edema, LE bruising  Device site - steri strips had been removed at time of visit, edges well approximated, no edema, hematoma, erythema. Incision healing well. No acute concerns from staff at facility related to device.    Device Check > within normal limits, see PaceArt for details    Ongoing arm restrictions reviewed with patient / caregiver.    Creighton Doffing, NP-C, AGACNP-BC [facility] - Electrophysiology  06/16/2023, 10:35 AM

## 2023-06-17 ENCOUNTER — Ambulatory Visit: Payer: Medicare Other | Attending: Pulmonary Disease | Admitting: Pulmonary Disease

## 2023-06-17 ENCOUNTER — Encounter: Payer: Self-pay | Admitting: Pulmonary Disease

## 2023-06-17 VITALS — BP 142/68 | HR 28 | Ht 68.0 in | Wt 143.3 lb

## 2023-06-17 DIAGNOSIS — I4821 Permanent atrial fibrillation: Secondary | ICD-10-CM | POA: Diagnosis not present

## 2023-06-17 DIAGNOSIS — I495 Sick sinus syndrome: Secondary | ICD-10-CM

## 2023-06-17 LAB — CUP PACEART INCLINIC DEVICE CHECK
Battery Remaining Longevity: 170 mo
Battery Voltage: 3.22 V
Brady Statistic RV Percent Paced: 65.81 %
Date Time Interrogation Session: 20250210110947
Implantable Lead Connection Status: 753985
Implantable Lead Implant Date: 20010509
Implantable Lead Location: 753860
Implantable Lead Model: 5076
Implantable Pulse Generator Implant Date: 20250122
Lead Channel Impedance Value: 342 Ohm
Lead Channel Impedance Value: 418 Ohm
Lead Channel Pacing Threshold Amplitude: 0.625 V
Lead Channel Pacing Threshold Pulse Width: 0.4 ms
Lead Channel Sensing Intrinsic Amplitude: 5 mV
Lead Channel Sensing Intrinsic Amplitude: 7.25 mV
Lead Channel Setting Pacing Amplitude: 2 V
Lead Channel Setting Pacing Pulse Width: 0.4 ms
Lead Channel Setting Sensing Sensitivity: 1.2 mV
Zone Setting Status: 755011

## 2023-06-17 NOTE — Patient Instructions (Signed)
 Medication Instructions:  Your physician recommends that you continue on your current medications as directed. Please refer to the Current Medication list given to you today.  *If you need a refill on your cardiac medications before your next appointment, please call your pharmacy*  Lab Work: None ordered If you have labs (blood work) drawn today and your tests are completely normal, you will receive your results only by: MyChart Message (if you have MyChart) OR A paper copy in the mail If you have any lab test that is abnormal or we need to change your treatment, we will call you to review the results.  Follow-Up: At [facility], you and your health needs are our priority.  As part of our continuing mission to provide you with exceptional heart care, we have created designated Provider Care Teams.  These Care Teams include your primary Cardiologist (physician) and Advanced Practice Providers (APPs -  Physician Assistants and Nurse Practitioners) who all work together to provide you with the care you need, when you need it.  Your next appointment:   As previously scheduled

## 2023-07-10 ENCOUNTER — Ambulatory Visit

## 2023-07-10 DIAGNOSIS — I495 Sick sinus syndrome: Secondary | ICD-10-CM

## 2023-07-15 LAB — CUP PACEART REMOTE DEVICE CHECK
Battery Remaining Longevity: 168 mo
Battery Voltage: 3.22 V
Brady Statistic RV Percent Paced: 63.18 %
Date Time Interrogation Session: 20250307123044
Implantable Lead Connection Status: 753985
Implantable Lead Implant Date: 20010509
Implantable Lead Location: 753860
Implantable Lead Model: 5076
Implantable Pulse Generator Implant Date: 20250122
Lead Channel Impedance Value: 304 Ohm
Lead Channel Impedance Value: 380 Ohm
Lead Channel Pacing Threshold Amplitude: 0.625 V
Lead Channel Pacing Threshold Pulse Width: 0.4 ms
Lead Channel Sensing Intrinsic Amplitude: 4.375 mV
Lead Channel Sensing Intrinsic Amplitude: 4.375 mV
Lead Channel Setting Pacing Amplitude: 2 V
Lead Channel Setting Pacing Pulse Width: 0.4 ms
Lead Channel Setting Sensing Sensitivity: 1.2 mV
Zone Setting Status: 755011

## 2023-07-16 ENCOUNTER — Encounter: Payer: Self-pay | Admitting: Internal Medicine

## 2023-08-02 ENCOUNTER — Encounter: Payer: Medicare Other | Admitting: Internal Medicine

## 2023-08-22 NOTE — Addendum Note (Signed)
 Addended by: Lott Rouleau A on: 08/22/2023 03:42 PM   Modules accepted: Orders

## 2023-08-22 NOTE — Progress Notes (Signed)
 Remote pacemaker transmission.

## 2023-09-09 ENCOUNTER — Ambulatory Visit: Payer: Medicare Other | Attending: Internal Medicine | Admitting: Internal Medicine

## 2023-09-09 ENCOUNTER — Encounter: Payer: Self-pay | Admitting: Internal Medicine

## 2023-09-09 VITALS — BP 130/62 | HR 60 | Ht 68.0 in | Wt 137.0 lb

## 2023-09-09 DIAGNOSIS — I495 Sick sinus syndrome: Secondary | ICD-10-CM

## 2023-09-09 LAB — CUP PACEART INCLINIC DEVICE CHECK
Brady Statistic RA Percent Paced: 31.4 %
Brady Statistic RV Percent Paced: 68.6 %
Date Time Interrogation Session: 20250505144641
Implantable Lead Connection Status: 753985
Implantable Lead Implant Date: 20010509
Implantable Lead Location: 753860
Implantable Lead Model: 5076
Implantable Pulse Generator Implant Date: 20250122

## 2023-09-09 NOTE — Progress Notes (Signed)
 HPI [name] returns today for followup. She is a pleasant 89 yo woman with a h/o CHB, s/p PPM, initially over 10 years ago s/p gen change out several months ago. In the interim, she notes no chest pain or sob or edema. She denies falls. She has developed progressive dementia. No syncope or falls however.   Allergies  Allergen Reactions   Bee Venom Swelling   Codeine     REACTION: GI distress   Lorazepam Other (See Comments)    "Really bad hallucinations", keeps her awake     Current Outpatient Medications  Medication Sig Dispense Refill   acetaminophen  (TYLENOL ) 325 MG tablet Take 2 tablets (650 mg total) by mouth every 6 (six) hours as needed. 36 tablet 0   albuterol  (VENTOLIN  HFA) 108 (90 Base) MCG/ACT inhaler Inhale 1-2 puffs into the lungs every 6 (six) hours as needed for wheezing or shortness of breath. (Patient taking differently: Inhale 2 puffs into the lungs every 6 (six) hours as needed for wheezing or shortness of breath.) 8.5 g 0   allopurinol  (ZYLOPRIM ) 300 MG tablet Take 1 tablet (300 mg total) by mouth daily. 30 tablet 0   apixaban  (ELIQUIS ) 5 MG TABS tablet TAKE (1) TABLET BY MOUTH TWICE DAILY. 60 tablet 5   D-Mannose 500 MG CAPS 2 tabs twice daily 120 capsule 5   donepezil  (ARICEPT ) 10 MG tablet Take 10 mg by mouth daily.     guaiFENesin -dextromethorphan  (ROBITUSSIN DM) 100-10 MG/5ML syrup Take 10 mLs by mouth every 12 (twelve) hours as needed for cough (congestion).     metoprolol  succinate (TOPROL -XL) 50 MG 24 hr tablet Take 1 tablet (50 mg total) by mouth daily. Take with or immediately following a meal. 30 tablet 0   nitrofurantoin , macrocrystal-monohydrate, (MACROBID ) 100 MG capsule Take 100 mg by mouth daily.     Nutritional Supplement LIQD Take 120 mLs by mouth 2 (two) times daily. "House supplement"     sertraline (ZOLOFT) 25 MG tablet Take 25 mg by mouth daily.     zinc  oxide 20 % ointment Apply 1 Application topically See admin instructions. Apply to  sacrum once daily.  May also apply topically every 6 hours as needed for redness     No current facility-administered medications for this visit.     Past Medical History:  Diagnosis Date   Arthritis    Bilateral chronic knee pain    Brain aneurysm    Brain aneurysm    Brain aneurysm    COPD (chronic obstructive pulmonary disease) (HCC)    DDD (degenerative disc disease), lumbar    Dementia (HCC)    DJD (degenerative joint disease)    DJD (degenerative joint disease)    DVT (deep venous thrombosis) (HCC)    Gout    Hypertension    Peripheral neuropathy    Permanent atrial fibrillation [facility])    MDT ZOXW96 pacemaker 03-26-2013 by Dr Carolynne Citron   Pulmonary embolism [facility])    Renal disorder     ROS:   All systems reviewed and negative except as noted in the HPI.   Past Surgical History:  Procedure Laterality Date   CARDIAC CATHETERIZATION  2001   Craniotomy clipping  06-2002   unruptured   Left paraclinoid segment anurysum     [facility]   PACEMAKER INSERTION  09-13-1999; 03-26-2013   PPM gen change by Dr Carolynne Citron 03-26-2013 - MDT [id number]   PERMANENT PACEMAKER GENERATOR CHANGE N/A 03/26/2013  Procedure: PERMANENT PACEMAKER GENERATOR CHANGE;  Surgeon: Tammie Fall, MD;  Location: [facility] CATH LAB;  Service: Cardiovascular;  Laterality: N/A;   PPM GENERATOR CHANGEOUT N/A 05/29/2023   Procedure: PPM GENERATOR CHANGEOUT;  Surgeon: Tammie Fall, MD;  Location: [facility] INVASIVE CV LAB;  Service: Cardiovascular;  Laterality: N/A;     Family History  Problem Relation Age of Onset   Heart attack Mother    Heart attack Father    Alzheimer's disease Sister      Social History   Socioeconomic History   Marital status: Widowed    Spouse name: Not on file   Number of children: Not on file   Years of education: Not on file   Highest education level: Not on file  Occupational History   Occupation: retired from the telephone company  Tobacco Use   Smoking  status: Former    Current packs/day: 0.00    Average packs/day: 0.3 packs/day for 35.0 years (8.8 ttl pk-yrs)    Types: Cigarettes    Start date: 12/27/1977    Quit date: 05/26/2010    Years since quitting: 13.2   Smokeless tobacco: Never  Vaping Use   Vaping status: Not on file  Substance and Sexual Activity   Alcohol use: No    Alcohol/week: 0.0 standard drinks of alcohol   Drug use: No   Sexual activity: Not on file  Other Topics Concern   Not on file  Social History Narrative   Not on file   Social Drivers of Health   Financial Resource Strain: Not on file  Food Insecurity: Patient Unable To Answer (05/06/2023)   Hunger Vital Sign    Worried About Running Out of Food in the Last Year: Patient unable to answer    Ran Out of Food in the Last Year: Patient unable to answer  Transportation Needs: Patient Unable To Answer (05/06/2023)   PRAPARE - Transportation    Lack of Transportation (Medical): Patient unable to answer    Lack of Transportation (Non-Medical): Patient unable to answer  Physical Activity: Not on file  Stress: Not on file  Social Connections: Patient Unable To Answer (05/06/2023)   Social Connection and Isolation Panel [NHANES]    Frequency of Communication with Friends and Family: Patient unable to answer    Frequency of Social Gatherings with Friends and Family: Patient unable to answer    Attends Religious Services: Patient unable to answer    Active Member of Clubs or Organizations: Patient unable to answer    Attends Banker Meetings: Patient unable to answer    Marital Status: Patient unable to answer  Intimate Partner Violence: Patient Unable To Answer (05/06/2023)   Humiliation, Afraid, Rape, and Kick questionnaire    Fear of Current or Ex-Partner: Patient unable to answer    Emotionally Abused: Patient unable to answer    Physically Abused: Patient unable to answer    Sexually Abused: Patient unable to answer     BP 130/62   Pulse  60   Ht 5\' 8"  (1.727 m)   Wt 137 lb (62.1 kg)   SpO2 94%   BMI 20.83 kg/m   Physical Exam:  elderly appearing NAD HEENT: Unremarkable Neck:  No JVD, no thyromegally Lymphatics:  No adenopathy Back:  No CVA tenderness Lungs:  Clear with no wheezes HEART:  IRegular rate rhythm, no murmurs, no rubs, no clicks Abd:  soft, positive bowel sounds, no organomegally, no rebound, no guarding Ext:  2 plus pulses,  no edema, no cyanosis, no clubbing Skin:  No rashes no nodules Neuro:  CN II through XII intact, motor grossly intact   DEVICE  Normal device function.  See PaceArt for details.   Assess/Plan:  HTN - she has a h/o HTN but her pressures are currently controlled. She will continue metoprolol . Atrial fib - her VR is well controlled. Continue metoprolol  Coags - she has not had any bleeding on eliquis . PPM - her medtronic single chamber PPM is working normally. She has undergone PPM gen change.   Pete Brand Minka Knight,MD

## 2023-09-09 NOTE — Patient Instructions (Signed)
 Medication Instructions:  Your physician recommends that you continue on your current medications as directed. Please refer to the Current Medication list given to you today.  *If you need a refill on your cardiac medications before your next appointment, please call your pharmacy*  Lab Work: None If you have labs (blood work) drawn today and your tests are completely normal, you will receive your results only by: MyChart Message (if you have MyChart) OR A paper copy in the mail If you have any lab test that is abnormal or we need to change your treatment, we will call you to review the results.  Testing/Procedures: None  Follow-Up: At [facility], you and your health needs are our priority.  As part of our continuing mission to provide you with exceptional heart care, our providers are all part of one team.  This team includes your primary Cardiologist (physician) and Advanced Practice Providers or APPs (Physician Assistants and Nurse Practitioners) who all work together to provide you with the care you need, when you need it.  Your next appointment:   1 year(s)  Provider:   Manya Sells, MD  We recommend signing up for the patient portal called "MyChart".  Sign up information is provided on this After Visit Summary.  MyChart is used to connect with patients for Virtual Visits (Telemedicine).  Patients are able to view lab/test results, encounter notes, upcoming appointments, etc.  Non-urgent messages can be sent to your provider as well.   To learn more about what you can do with MyChart, go to [email or web address].   Other Instructions

## 2023-10-09 ENCOUNTER — Ambulatory Visit (INDEPENDENT_AMBULATORY_CARE_PROVIDER_SITE_OTHER)

## 2023-10-09 DIAGNOSIS — I495 Sick sinus syndrome: Secondary | ICD-10-CM | POA: Diagnosis not present

## 2023-10-09 LAB — CUP PACEART REMOTE DEVICE CHECK
Battery Remaining Longevity: 164 mo
Battery Voltage: 3.19 V
Brady Statistic RV Percent Paced: 66.33 %
Date Time Interrogation Session: 20250603210704
Implantable Lead Connection Status: 753985
Implantable Lead Implant Date: 20010509
Implantable Lead Location: 753860
Implantable Lead Model: 5076
Implantable Pulse Generator Implant Date: 20250122
Lead Channel Impedance Value: 323 Ohm
Lead Channel Impedance Value: 380 Ohm
Lead Channel Pacing Threshold Amplitude: 0.625 V
Lead Channel Pacing Threshold Pulse Width: 0.4 ms
Lead Channel Sensing Intrinsic Amplitude: 4.375 mV
Lead Channel Sensing Intrinsic Amplitude: 4.375 mV
Lead Channel Setting Pacing Amplitude: 2 V
Lead Channel Setting Pacing Pulse Width: 0.4 ms
Lead Channel Setting Sensing Sensitivity: 1.2 mV
Zone Setting Status: 755011

## 2023-10-10 ENCOUNTER — Ambulatory Visit: Payer: Self-pay | Admitting: Internal Medicine

## 2023-12-02 NOTE — Progress Notes (Signed)
 Remote pacemaker transmission.

## 2023-12-06 DEATH — deceased

## 2024-01-08 ENCOUNTER — Encounter

## 2024-04-08 ENCOUNTER — Encounter

## 2024-07-08 ENCOUNTER — Encounter
